# Patient Record
Sex: Female | Born: 1945 | Race: White | Hispanic: No | Smoking: Never smoker
Health system: Southern US, Community
[De-identification: ages and names within clinical notes are randomized; demographics above are authoritative.]

## PROBLEM LIST (undated history)

## (undated) DIAGNOSIS — K759 Inflammatory liver disease, unspecified: Secondary | ICD-10-CM

## (undated) DIAGNOSIS — Z8489 Family history of other specified conditions: Secondary | ICD-10-CM

## (undated) DIAGNOSIS — J302 Other seasonal allergic rhinitis: Secondary | ICD-10-CM

## (undated) DIAGNOSIS — F419 Anxiety disorder, unspecified: Secondary | ICD-10-CM

## (undated) DIAGNOSIS — F329 Major depressive disorder, single episode, unspecified: Secondary | ICD-10-CM

## (undated) DIAGNOSIS — F32A Depression, unspecified: Secondary | ICD-10-CM

## (undated) DIAGNOSIS — K8050 Calculus of bile duct without cholangitis or cholecystitis without obstruction: Secondary | ICD-10-CM

## (undated) DIAGNOSIS — I4891 Unspecified atrial fibrillation: Secondary | ICD-10-CM

## (undated) DIAGNOSIS — I499 Cardiac arrhythmia, unspecified: Secondary | ICD-10-CM

## (undated) HISTORY — DX: Depression, unspecified: F32.A

## (undated) HISTORY — DX: Major depressive disorder, single episode, unspecified: F32.9

## (undated) HISTORY — DX: Other seasonal allergic rhinitis: J30.2

## (undated) HISTORY — PX: ERCP: SHX60

## (undated) HISTORY — DX: Calculus of bile duct without cholangitis or cholecystitis without obstruction: K80.50

---
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
## 1982-08-17 HISTORY — PX: APPENDECTOMY: SHX54

## 1982-08-17 HISTORY — PX: ABDOMINAL HYSTERECTOMY: SHX81

## 1991-08-18 HISTORY — PX: HAMMER TOE SURGERY: SHX385

## 1998-09-03 ENCOUNTER — Other Ambulatory Visit: Admission: RE | Admit: 1998-09-03 | Discharge: 1998-09-03 | Payer: Self-pay | Admitting: Otolaryngology

## 2000-01-13 ENCOUNTER — Encounter: Admission: RE | Admit: 2000-01-13 | Discharge: 2000-01-13 | Payer: Self-pay | Admitting: *Deleted

## 2002-08-21 ENCOUNTER — Encounter: Admission: RE | Admit: 2002-08-21 | Discharge: 2002-08-21 | Payer: Self-pay | Admitting: *Deleted

## 2002-09-04 ENCOUNTER — Encounter: Admission: RE | Admit: 2002-09-04 | Discharge: 2002-09-04 | Payer: Self-pay | Admitting: *Deleted

## 2003-04-12 ENCOUNTER — Encounter: Admission: RE | Admit: 2003-04-12 | Discharge: 2003-04-12 | Payer: Self-pay | Admitting: Geriatric Medicine

## 2004-04-23 ENCOUNTER — Encounter: Admission: RE | Admit: 2004-04-23 | Discharge: 2004-04-23 | Payer: Self-pay | Admitting: Pulmonary Disease

## 2004-07-23 ENCOUNTER — Ambulatory Visit: Payer: Self-pay | Admitting: Pulmonary Disease

## 2004-09-30 ENCOUNTER — Ambulatory Visit: Payer: Self-pay | Admitting: Pulmonary Disease

## 2004-12-05 ENCOUNTER — Ambulatory Visit (HOSPITAL_COMMUNITY): Admission: RE | Admit: 2004-12-05 | Discharge: 2004-12-05 | Payer: Self-pay | Admitting: Family Medicine

## 2005-03-23 ENCOUNTER — Ambulatory Visit: Payer: Self-pay | Admitting: Pulmonary Disease

## 2005-05-28 ENCOUNTER — Encounter: Admission: RE | Admit: 2005-05-28 | Discharge: 2005-05-28 | Payer: Self-pay | Admitting: Pulmonary Disease

## 2005-06-02 ENCOUNTER — Ambulatory Visit: Payer: Self-pay | Admitting: Sports Medicine

## 2005-06-09 ENCOUNTER — Ambulatory Visit: Payer: Self-pay | Admitting: Sports Medicine

## 2005-06-15 ENCOUNTER — Ambulatory Visit: Payer: Self-pay | Admitting: Pulmonary Disease

## 2005-07-07 ENCOUNTER — Ambulatory Visit: Payer: Self-pay | Admitting: Sports Medicine

## 2005-08-07 ENCOUNTER — Ambulatory Visit: Payer: Self-pay | Admitting: Sports Medicine

## 2005-08-21 ENCOUNTER — Ambulatory Visit: Payer: Self-pay | Admitting: Sports Medicine

## 2006-02-12 ENCOUNTER — Ambulatory Visit: Payer: Self-pay | Admitting: Sports Medicine

## 2006-02-15 ENCOUNTER — Ambulatory Visit: Payer: Self-pay | Admitting: Pulmonary Disease

## 2006-03-22 ENCOUNTER — Ambulatory Visit: Payer: Self-pay | Admitting: Internal Medicine

## 2006-06-03 ENCOUNTER — Encounter: Admission: RE | Admit: 2006-06-03 | Discharge: 2006-06-03 | Payer: Self-pay | Admitting: Pulmonary Disease

## 2006-06-23 ENCOUNTER — Ambulatory Visit: Payer: Self-pay | Admitting: Pulmonary Disease

## 2006-08-18 ENCOUNTER — Ambulatory Visit: Payer: Self-pay | Admitting: Pulmonary Disease

## 2006-09-15 ENCOUNTER — Ambulatory Visit: Payer: Self-pay | Admitting: Sports Medicine

## 2006-09-28 ENCOUNTER — Ambulatory Visit: Payer: Self-pay | Admitting: Pulmonary Disease

## 2006-10-26 ENCOUNTER — Ambulatory Visit: Payer: Self-pay | Admitting: Pulmonary Disease

## 2006-10-26 LAB — CONVERTED CEMR LAB
ALT: 28 units/L (ref 0–40)
AST: 30 units/L (ref 0–37)
BUN: 13 mg/dL (ref 6–23)
Basophils Absolute: 0 10*3/uL (ref 0.0–0.1)
CO2: 30 meq/L (ref 19–32)
Calcium: 9.8 mg/dL (ref 8.4–10.5)
Eosinophils Absolute: 0.1 10*3/uL (ref 0.0–0.6)
Eosinophils Relative: 1.4 % (ref 0.0–5.0)
Glucose, Bld: 100 mg/dL — ABNORMAL HIGH (ref 70–99)
HCT: 42 % (ref 36.0–46.0)
Hemoglobin: 14.4 g/dL (ref 12.0–15.0)
Lymphocytes Relative: 40.4 % (ref 12.0–46.0)
MCHC: 34.2 g/dL (ref 30.0–36.0)
MCV: 85.4 fL (ref 78.0–100.0)
Monocytes Relative: 9.9 % (ref 3.0–11.0)
Neutrophils Relative %: 48.1 % (ref 43.0–77.0)
Platelets: 216 10*3/uL (ref 150–400)
Potassium: 4.3 meq/L (ref 3.5–5.1)
RDW: 12.6 % (ref 11.5–14.6)
TSH: 0.47 microintl units/mL (ref 0.35–5.50)
Total Bilirubin: 1 mg/dL (ref 0.3–1.2)
Total Protein: 6.7 g/dL (ref 6.0–8.3)

## 2006-11-15 ENCOUNTER — Ambulatory Visit: Payer: Self-pay | Admitting: Pulmonary Disease

## 2007-04-12 ENCOUNTER — Ambulatory Visit: Payer: Self-pay | Admitting: Sports Medicine

## 2007-04-21 ENCOUNTER — Telehealth: Payer: Self-pay | Admitting: Sports Medicine

## 2007-05-27 ENCOUNTER — Ambulatory Visit: Payer: Self-pay | Admitting: Sports Medicine

## 2007-06-22 ENCOUNTER — Ambulatory Visit: Payer: Self-pay | Admitting: Pulmonary Disease

## 2007-08-16 ENCOUNTER — Ambulatory Visit: Payer: Self-pay | Admitting: Pulmonary Disease

## 2007-08-16 DIAGNOSIS — F341 Dysthymic disorder: Secondary | ICD-10-CM

## 2007-08-16 DIAGNOSIS — D1739 Benign lipomatous neoplasm of skin and subcutaneous tissue of other sites: Secondary | ICD-10-CM | POA: Insufficient documentation

## 2007-08-16 DIAGNOSIS — M545 Low back pain, unspecified: Secondary | ICD-10-CM | POA: Insufficient documentation

## 2007-08-23 ENCOUNTER — Encounter: Payer: Self-pay | Admitting: Pulmonary Disease

## 2007-09-01 ENCOUNTER — Encounter: Admission: RE | Admit: 2007-09-01 | Discharge: 2007-09-01 | Payer: Self-pay | Admitting: Pulmonary Disease

## 2007-10-18 ENCOUNTER — Ambulatory Visit: Payer: Self-pay | Admitting: Pulmonary Disease

## 2007-10-18 LAB — CONVERTED CEMR LAB
BUN: 14 mg/dL (ref 6–23)
CO2: 30 meq/L (ref 19–32)
Chloride: 107 meq/L (ref 96–112)
Creatinine, Ser: 0.8 mg/dL (ref 0.4–1.2)
GFR calc Af Amer: 93 mL/min
Glucose, Bld: 84 mg/dL (ref 70–99)
HCT: 42.1 % (ref 36.0–46.0)
Hemoglobin: 13.9 g/dL (ref 12.0–15.0)
MCHC: 32.9 g/dL (ref 30.0–36.0)
MCV: 85.7 fL (ref 78.0–100.0)
Monocytes Absolute: 0.5 10*3/uL (ref 0.2–0.7)
Monocytes Relative: 6.8 % (ref 3.0–11.0)
Neutrophils Relative %: 52.7 % (ref 43.0–77.0)
Sodium: 142 meq/L (ref 135–145)
WBC: 7.5 10*3/uL (ref 4.5–10.5)

## 2007-10-25 ENCOUNTER — Ambulatory Visit: Payer: Self-pay | Admitting: Pulmonary Disease

## 2007-11-29 ENCOUNTER — Ambulatory Visit: Payer: Self-pay | Admitting: Pulmonary Disease

## 2007-12-05 ENCOUNTER — Telehealth: Payer: Self-pay | Admitting: Pulmonary Disease

## 2007-12-09 ENCOUNTER — Encounter: Payer: Self-pay | Admitting: Pulmonary Disease

## 2008-01-10 ENCOUNTER — Encounter: Payer: Self-pay | Admitting: Pulmonary Disease

## 2008-01-13 ENCOUNTER — Encounter: Admission: RE | Admit: 2008-01-13 | Discharge: 2008-01-13 | Payer: Self-pay | Admitting: Obstetrics and Gynecology

## 2008-01-19 ENCOUNTER — Encounter: Admission: RE | Admit: 2008-01-19 | Discharge: 2008-01-19 | Payer: Self-pay | Admitting: Obstetrics and Gynecology

## 2008-01-19 ENCOUNTER — Encounter (INDEPENDENT_AMBULATORY_CARE_PROVIDER_SITE_OTHER): Payer: Self-pay | Admitting: Interventional Radiology

## 2008-01-19 ENCOUNTER — Other Ambulatory Visit: Admission: RE | Admit: 2008-01-19 | Discharge: 2008-01-19 | Payer: Self-pay | Admitting: Interventional Radiology

## 2008-01-20 ENCOUNTER — Encounter: Payer: Self-pay | Admitting: Pulmonary Disease

## 2008-01-31 ENCOUNTER — Encounter: Payer: Self-pay | Admitting: Pulmonary Disease

## 2008-02-01 ENCOUNTER — Encounter: Payer: Self-pay | Admitting: Pulmonary Disease

## 2008-02-06 ENCOUNTER — Telehealth: Payer: Self-pay | Admitting: Sports Medicine

## 2008-02-06 ENCOUNTER — Encounter: Payer: Self-pay | Admitting: Sports Medicine

## 2008-02-07 ENCOUNTER — Encounter: Payer: Self-pay | Admitting: Pulmonary Disease

## 2008-04-04 ENCOUNTER — Ambulatory Visit: Payer: Self-pay | Admitting: Pulmonary Disease

## 2008-05-23 ENCOUNTER — Ambulatory Visit: Payer: Self-pay | Admitting: Pulmonary Disease

## 2008-05-23 ENCOUNTER — Encounter: Payer: Self-pay | Admitting: Adult Health

## 2008-05-24 ENCOUNTER — Telehealth (INDEPENDENT_AMBULATORY_CARE_PROVIDER_SITE_OTHER): Payer: Self-pay | Admitting: *Deleted

## 2008-05-25 LAB — CONVERTED CEMR LAB: Influenza B Ag: NEGATIVE

## 2008-05-28 ENCOUNTER — Telehealth (INDEPENDENT_AMBULATORY_CARE_PROVIDER_SITE_OTHER): Payer: Self-pay | Admitting: *Deleted

## 2008-08-28 ENCOUNTER — Ambulatory Visit: Payer: Self-pay | Admitting: Pulmonary Disease

## 2008-09-13 ENCOUNTER — Ambulatory Visit: Payer: Self-pay | Admitting: Pulmonary Disease

## 2008-10-05 ENCOUNTER — Telehealth (INDEPENDENT_AMBULATORY_CARE_PROVIDER_SITE_OTHER): Payer: Self-pay | Admitting: *Deleted

## 2008-10-22 ENCOUNTER — Telehealth (INDEPENDENT_AMBULATORY_CARE_PROVIDER_SITE_OTHER): Payer: Self-pay | Admitting: *Deleted

## 2008-10-23 ENCOUNTER — Ambulatory Visit: Payer: Self-pay | Admitting: Pulmonary Disease

## 2008-10-23 DIAGNOSIS — K5289 Other specified noninfective gastroenteritis and colitis: Secondary | ICD-10-CM

## 2008-10-23 LAB — CONVERTED CEMR LAB: Vit D, 25-Hydroxy: 41 ng/mL (ref 30–89)

## 2008-10-24 ENCOUNTER — Telehealth: Payer: Self-pay | Admitting: Pulmonary Disease

## 2008-10-24 ENCOUNTER — Encounter: Payer: Self-pay | Admitting: Pulmonary Disease

## 2008-10-26 LAB — CONVERTED CEMR LAB
ALT: 30 units/L (ref 0–35)
AST: 36 units/L (ref 0–37)
Albumin: 4 g/dL (ref 3.5–5.2)
Alkaline Phosphatase: 60 units/L (ref 39–117)
Basophils Absolute: 0 10*3/uL (ref 0.0–0.1)
CO2: 25 meq/L (ref 19–32)
Calcium: 9 mg/dL (ref 8.4–10.5)
Eosinophils Absolute: 0.1 10*3/uL (ref 0.0–0.7)
Eosinophils Relative: 1.7 % (ref 0.0–5.0)
GFR calc Af Amer: 108 mL/min
Lymphocytes Relative: 35.1 % (ref 12.0–46.0)
MCHC: 33.9 g/dL (ref 30.0–36.0)
MCV: 84 fL (ref 78.0–100.0)
Monocytes Absolute: 0.5 10*3/uL (ref 0.1–1.0)
Monocytes Relative: 10.3 % (ref 3.0–12.0)
Neutrophils Relative %: 52 % (ref 43.0–77.0)
Sodium: 140 meq/L (ref 135–145)
TSH: 0.43 microintl units/mL (ref 0.35–5.50)

## 2009-03-08 ENCOUNTER — Ambulatory Visit: Payer: Self-pay | Admitting: Internal Medicine

## 2009-03-15 ENCOUNTER — Telehealth: Payer: Self-pay | Admitting: Pulmonary Disease

## 2009-04-02 ENCOUNTER — Ambulatory Visit: Payer: Self-pay | Admitting: Internal Medicine

## 2009-04-02 DIAGNOSIS — J069 Acute upper respiratory infection, unspecified: Secondary | ICD-10-CM | POA: Insufficient documentation

## 2009-06-07 ENCOUNTER — Telehealth (INDEPENDENT_AMBULATORY_CARE_PROVIDER_SITE_OTHER): Payer: Self-pay | Admitting: *Deleted

## 2009-06-17 ENCOUNTER — Ambulatory Visit: Payer: Self-pay | Admitting: Pulmonary Disease

## 2009-07-05 ENCOUNTER — Ambulatory Visit: Payer: Self-pay | Admitting: Pulmonary Disease

## 2009-07-05 DIAGNOSIS — E042 Nontoxic multinodular goiter: Secondary | ICD-10-CM

## 2009-07-05 DIAGNOSIS — B009 Herpesviral infection, unspecified: Secondary | ICD-10-CM

## 2009-07-15 ENCOUNTER — Ambulatory Visit: Payer: Self-pay | Admitting: Pulmonary Disease

## 2009-07-16 ENCOUNTER — Emergency Department (HOSPITAL_COMMUNITY): Admission: EM | Admit: 2009-07-16 | Discharge: 2009-07-16 | Payer: Self-pay | Admitting: Emergency Medicine

## 2009-07-29 ENCOUNTER — Telehealth (INDEPENDENT_AMBULATORY_CARE_PROVIDER_SITE_OTHER): Payer: Self-pay | Admitting: *Deleted

## 2010-02-10 ENCOUNTER — Telehealth (INDEPENDENT_AMBULATORY_CARE_PROVIDER_SITE_OTHER): Payer: Self-pay | Admitting: *Deleted

## 2010-02-11 ENCOUNTER — Encounter (INDEPENDENT_AMBULATORY_CARE_PROVIDER_SITE_OTHER): Payer: Self-pay | Admitting: *Deleted

## 2010-05-22 ENCOUNTER — Ambulatory Visit: Payer: Self-pay | Admitting: Pulmonary Disease

## 2010-08-22 ENCOUNTER — Ambulatory Visit: Admit: 2010-08-22 | Payer: Self-pay | Admitting: Pulmonary Disease

## 2010-08-25 ENCOUNTER — Telehealth: Payer: Self-pay | Admitting: Adult Health

## 2010-09-05 ENCOUNTER — Ambulatory Visit
Admission: RE | Admit: 2010-09-05 | Discharge: 2010-09-05 | Payer: Self-pay | Source: Home / Self Care | Attending: Pulmonary Disease | Admitting: Pulmonary Disease

## 2010-09-08 ENCOUNTER — Telehealth: Payer: Self-pay | Admitting: Pulmonary Disease

## 2010-09-16 NOTE — Letter (Signed)
Summary: Out of Work  [facility]  [address]   [place], [place] [address]   Phone: [phone]  Fax: [phone]    February 11, 2010   Passenger:  Gladie P ROGERS    To Whom It May Concern:   For Medical reasons, please allow the above named passenger to have the her therapeutic red gel balls on the flight. If you need additional information, please feel free to contact our office.         Sincerely,    Sibyl Parr. Fields, M.D./Else Habermann Christell Constant CMA

## 2010-09-16 NOTE — Progress Notes (Signed)
Summary: drs note  Phone Note Call from Patient Call back at [phone]   Caller: Patient Call For: nadel Summary of Call: pt need drs note to carry therapy ballls on the plane. Initial call taken by: Rickard Patience,  February 10, 2010 3:52 PM  Follow-up for Phone Call        Pt requesting note so that she can carry "therapy balls" for arthritis.  Please advise thanks! Carly Rhodes  February 10, 2010 3:57 PM   Additional Follow-up for Phone Call Additional follow up Details #1::        per SN---this is beyond the scope of my expertise---SN could not qualify as a specialist that could authorize therapy balls to be transported on a plane.   Randell Loop CMA  February 10, 2010 4:18 PM     Additional Follow-up for Phone Call Additional follow up Details #2::    Spoke with pt and advised the above recs per SN.  Pt verbalized understanding. Follow-up by: Carly Rhodes,  February 10, 2010 4:39 PM

## 2010-09-16 NOTE — Assessment & Plan Note (Signed)
Summary: flu shot/jd   Nurse Visit   Allergies: No Known Drug Allergies  Orders Added: 1)  Admin 1st Vaccine [90471] 2)  Flu Vaccine 80yrs + [04540] Flu Vaccine Consent Questions     Do you have a history of severe allergic reactions to this vaccine? no    Any prior history of allergic reactions to egg and/or gelatin? no    Do you have a sensitivity to the preservative Thimersol? no    Do you have a past history of Guillan-Barre Syndrome? no    Do you currently have an acute febrile illness? no    Have you ever had a severe reaction to latex? no    Vaccine information given and explained to patient? yes    Are you currently pregnant? no    Lot Number:[id number]   Exp Date:02/14/2011   Site Given  Left Deltoid IM   Tammy Scott  May 22, 2010 11:53 AM
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
## 2010-09-18 NOTE — Progress Notes (Signed)
Summary: nos appt  Phone Note Call from Patient   Caller: [email or web address]  Call For: Kester Stimpson Summary of Call: ATC pt to rsc nos from 1/6, wrong home phone number and cell phone disconnected. Initial call taken by: Darletta Moll,  August 25, 2010 9:58 AM

## 2010-09-18 NOTE — Progress Notes (Signed)
Summary: needs refill  Phone Note Call from Patient Call back at [phone]   Caller: Patient Call For: Carly Rhodes Reason for Call: Refill Medication Summary of Call: patient did not get a rx for fluoxetine 40 mg on friday. she would like a refill for that and a refill for a rx for fever blisters but not the acyclovir 400 mg. she said she wants the rx dr Kriste Basque gave her for those.  pharm [facility]. [phone] Initial call taken by: Valinda Hoar,  September 08, 2010 8:11 AM  Follow-up for Phone Call        Pt is requesting a refill on fluoxetine and also on valtrex. She has acyclovir on med list but she states that this not work as well as valtrex. Please advsie if ok to refill both. Carron Curie CMA  September 08, 2010 10:16 AM   Additional Follow-up for Phone Call Additional follow up Details #1::        Pt informed. Zackery Barefoot CMA  September 08, 2010 10:38 AM     New/Updated Medications: VALTREX 1 GM TABS (VALACYCLOVIR HCL) 2 by mouth two times a day x 1 day Prescriptions: VALTREX 1 GM TABS (VALACYCLOVIR HCL) 2 by mouth two times a day x 1 day  #4 x 5   Entered and Authorized by:   Rubye Oaks NP   Signed by:   Rubye Oaks NP on 09/08/2010   Method used:   Electronically to        [facility] [address] (retail)       [phone]       [place], [place]  [address]       Ph: [phone]       Fax: [phone]   RxID:   [id number] FLUOXETINE HCL 40 MG  CAPS (FLUOXETINE HCL) Take 1 tablet by mouth once a day  #30 Each x 11   Entered and Authorized by:   Rubye Oaks NP   Signed by:   Rubye Oaks NP on 09/08/2010   Method used:   Electronically to        [facility] [address] (retail)       [phone]       [place], [place]  [address]       Ph: [phone]       Fax: [phone]   RxID:   [id number]

## 2010-10-31 ENCOUNTER — Encounter: Payer: Self-pay | Admitting: Pulmonary Disease

## 2010-10-31 ENCOUNTER — Ambulatory Visit (INDEPENDENT_AMBULATORY_CARE_PROVIDER_SITE_OTHER)
Admission: RE | Admit: 2010-10-31 | Discharge: 2010-10-31 | Disposition: A | Discharge status: Home / Self Care | Payer: Medicare Other | Source: Ambulatory Visit | Attending: Pulmonary Disease | Admitting: Pulmonary Disease

## 2010-10-31 ENCOUNTER — Other Ambulatory Visit: Payer: Medicare Other

## 2010-10-31 ENCOUNTER — Encounter (INDEPENDENT_AMBULATORY_CARE_PROVIDER_SITE_OTHER): Payer: Medicare Other | Admitting: Pulmonary Disease

## 2010-10-31 ENCOUNTER — Other Ambulatory Visit: Payer: Self-pay | Admitting: Pulmonary Disease

## 2010-10-31 DIAGNOSIS — D1739 Benign lipomatous neoplasm of skin and subcutaneous tissue of other sites: Secondary | ICD-10-CM

## 2010-10-31 DIAGNOSIS — F341 Dysthymic disorder: Secondary | ICD-10-CM

## 2010-10-31 DIAGNOSIS — M545 Low back pain, unspecified: Secondary | ICD-10-CM

## 2010-10-31 DIAGNOSIS — R05 Cough: Secondary | ICD-10-CM

## 2010-10-31 DIAGNOSIS — M810 Age-related osteoporosis without current pathological fracture: Secondary | ICD-10-CM

## 2010-10-31 DIAGNOSIS — J069 Acute upper respiratory infection, unspecified: Secondary | ICD-10-CM

## 2010-10-31 DIAGNOSIS — E042 Nontoxic multinodular goiter: Secondary | ICD-10-CM

## 2010-10-31 DIAGNOSIS — E785 Hyperlipidemia, unspecified: Secondary | ICD-10-CM

## 2010-10-31 DIAGNOSIS — E7800 Pure hypercholesterolemia, unspecified: Secondary | ICD-10-CM

## 2010-10-31 LAB — CBC WITH DIFFERENTIAL/PLATELET
Basophils Absolute: 0 10*3/uL (ref 0.0–0.1)
Eosinophils Relative: 0.9 % (ref 0.0–5.0)
Lymphocytes Relative: 19.6 % (ref 12.0–46.0)
MCHC: 34.7 g/dL (ref 30.0–36.0)
Monocytes Relative: 5.9 % (ref 3.0–12.0)
Neutro Abs: 6 10*3/uL (ref 1.4–7.7)
Neutrophils Relative %: 73.2 % (ref 43.0–77.0)
Platelets: 185 10*3/uL (ref 150.0–400.0)

## 2010-10-31 LAB — BASIC METABOLIC PANEL
Creatinine, Ser: 0.6 mg/dL (ref 0.4–1.2)
GFR: 106.27 mL/min (ref >60.00)
Sodium: 138 mEq/L (ref 135–145)

## 2010-10-31 LAB — HEPATIC FUNCTION PANEL
ALT: 24 U/L (ref 0–35)
AST: 25 U/L (ref 0–37)
Alkaline Phosphatase: 63 U/L (ref 39–117)
Total Bilirubin: 1 mg/dL (ref 0.3–1.2)

## 2010-10-31 LAB — LIPID PANEL
Total CHOL/HDL Ratio: 4
VLDL: 28.4 mg/dL (ref 0.0–40.0)

## 2010-10-31 LAB — TSH: TSH: 0.23 u[IU]/mL — ABNORMAL LOW (ref 0.35–5.50)

## 2010-11-02 LAB — CONVERTED CEMR LAB: Vit D, 25-Hydroxy: 50 ng/mL

## 2010-11-17 ENCOUNTER — Other Ambulatory Visit: Payer: Medicare Other

## 2010-11-18 NOTE — Assessment & Plan Note (Signed)
Summary: annual /sh   CC:  16 month ROV & review of mult medical problems....  History of Present Illness: 65 y/o WF here for a follow up visit... he has multiple medical problems as noted below...     ~  Mar10:  she presents today w/ "intestinal flu" stating that sh'e had 2d hx of feeling cold, N/ V/ D, aching all over, headache, now watery diarrhea w/o blood etc... denies f/c/s... states liquids "go right thru me" & "this is way past immodium" (she hasn't tried any OTC meds for her symptoms)...  she was seen by TP in Jan10 w/ URI and Rx w/ ZPak, then Augmentin- no problems after these meds... no other more recent antibiotics, no recent travel, no sick exposures...  we discussed gastroenteritis:  eval w/ lab work, check stools;  treatment w/ clear liq's, tylenol, lomotil sparingly...   ~  July 05, 2009:  she notes "last winter was bad- I had the flu 4 times"... c/o recent URI w/ ZPak given + Zyrtek, Nasacort/ Saline... c/o persistant symptoms and leaving for [place] in 3d... we discussed Rx w/ Depo80 + Medrol Dosepak and waiting on her flu shot til return...   ~  October 31, 2010:  68mo ROV & generally stable; requests refills as noted + ZPak to keep on hand; due for CXR, fasting blood work, & BMD...    Thyroid>  Prev eval as noted- multinod gland w/ bx ordered per DrDickstein & done by IR, DrYamagata was very difficult & painful according to the pt (benign non-neoplastic goiter);  she recalls prev throid eval by ENT w/ bx by ?DrByers that was easy;  serial TFTs w/ TSH in the low normal range;  TSH today = 0.23 & will need repeat w/ FreeT3 & FreeT4...    Hx LBP>  she sees Chiropractor & DrFields for sports med.    Dysthymia>  remains on Prozac40 & Alpraz 0.5 Prn;  notes that these meds continue to help & she requests continued refills...    Current Problems:   Hx of HEPATITIS A - age 89, no known residual problems...  HYPERCHOLESTEROLEMIA (ICD-272.0) - on diet, exercise & OTC meds;  she  refuses to consider prescription drug therapy for this problem...  ~  FLP 3/12 showed TChol 242, TG 142, HDL 56, LDL 157... she refuses med rx.  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 we do not have the details of the prev eval (?by ENT DrByers?)... TFT's here were normal--- looks like she had a thyroid bx by DrYamagata ordered by DrDickstein 6/09= c/w hyperplastic nodule/ non-neoplastic goiter (this bx was difficult & painful she recalls)...  ~  labs 3/08 showed TSH = 0.47  ~  labs 3/09 showed TSH = 0.55  ~  Thyroid Sonar 5/09 showed diffusely abn thyroid c/w multinod gland & bilat biopsies done 6/09 revealing hyperplastic nodules/ non-neoplastic goiter...  ~  labs 3/10 showed TSH= 0.43  ~  labs 3/12 showed TSH= 0.23  Hx of LOW BACK PAIN SYNDROME (ICD-724.2) - prev evals from chiropractor and DrFields for sports medicine...   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 she states "I've had a melt-down & left my husb after 57yrs of marriage"... she is seeing Dr. Garald Braver psychologist for counselling help "he says it's grief"... she is requesting to change to her sister's medication = EffexorXR, instead of Lexapro...  ~  2009: we accommodated her requests- tried Lexapro-  too $$$, Effexor- caused nightmares, then Zoloft, and now on generic Prozac 40mg /d...   ~  11/10: she remains on Prozac40mg /d & wishes to continue Rx as she feels this is continuing to help...  ~  3/12:  ditto- continues on Prozac40 & Alpraz 0.5mg  Prn & requests refills...  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 she went to plastic surg at [facility] w/ lipoma excision and removal of dystrophic right great toenail by DrMarks in 2009.  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  HEALTH MAINTENANCE:  she states that  she had a colonoscopy in [facility] in 2007... we do not have this report.... routine GYN- saw DrDickstein in 2009... she has her Mammograms at the [facility], but hasn't had a BMD- "they don't work, it was in the news"--- finally had BMD 6/09 by Ma Hillock GYN= TScores -2.9 in spine,  & -2.1 in left [facility] w/ rec for Rx by DrDickstein on ALENDRONATE... Vit D level 3/10= 41...    Preventive Screening-Counseling & Management  Alcohol-Tobacco     Smoking Status: never  Allergies (verified): No Known Drug Allergies  Comments:  Nurse/Medical Assistant: The patient's medications and allergies were reviewed with the patient and were updated in the Medication and Allergy Lists.  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 father - asthma, DM M uncles (5) - DM mother - brain cancer MGM - arthritis  Social History: Reviewed history from 03/08/2009 and no changes required. never smoked no alcohol lives alone going through a divorce currently 2 grown children drinks 2-3 cups coffee daily  Review of Systems       The patient complains of gas/bloating, back pain, stiffness, arthritis, depression, and hay fever.  The patient denies fever, chills, sweats, anorexia, fatigue, weakness, malaise, weight loss, sleep disorder, blurring, diplopia, eye irritation, eye discharge, vision loss, eye pain, photophobia, earache, ear discharge, tinnitus, decreased hearing, nasal congestion, nosebleeds, sore throat, hoarseness, chest pain, palpitations,  syncope, dyspnea on exertion, orthopnea, PND, peripheral edema, cough, dyspnea at rest, excessive sputum, hemoptysis, wheezing, pleurisy, nausea, vomiting, diarrhea, constipation, change in bowel habits, abdominal pain, melena, hematochezia, jaundice, indigestion/heartburn, dysphagia, odynophagia, dysuria, hematuria, urinary frequency, urinary hesitancy, nocturia, incontinence, joint pain, joint swelling, muscle cramps, muscle weakness, sciatica, restless legs, leg pain at night, leg pain with exertion, rash, itching, dryness, suspicious lesions, paralysis, paresthesias, seizures, tremors, vertigo, transient blindness, frequent falls, frequent headaches, difficulty walking, anxiety, memory loss, confusion, cold intolerance, heat intolerance, polydipsia, polyphagia, polyuria, unusual weight change, abnormal bruising, bleeding, enlarged lymph nodes, urticaria, allergic rash, and recurrent infections.    Vital Signs:  Patient profile:   65 year old female Height:      65 inches Weight:      171.25 pounds BMI:     28.60 O2 Sat:      99 % on Room air Temp:     99.1 degrees F oral Pulse rate:   73 / minute BP sitting:   120 / 74  (left arm) Cuff size:   regular  Vitals Entered By:  Randell Loop CMA (October 31, 2010 10:21 AM)  O2 Sat at Rest %:  99 O2 Flow:  Room air CC: 16 month ROV & review of mult medical problems... Is Patient Diabetic? No Pain Assessment Patient in pain? no      Comments meds updated today with pt   Physical Exam  Additional Exam:  WD, WN, 65 y/o WF in NAD... GENERAL:  Alert & oriented; pleasant & cooperative... HEENT:  [place]/AT, EOM-full, PERRLA, EACs-clear, TMs-normal, NOSE-sl pale, THROAT-clear & wnl. NECK:  Supple w/ fairROM; no JVD; normal carotid impulses w/o bruits; +thyromegaly w/ coarse bilat nodularity; no lymphadenopathy. CHEST:  Coarse BS w/ no wheezes, rales, or signs of consolidation. HEART:  Regular Rhythm; without murmurs/ rubs/ or gallops. ABDOMEN:  Soft  & nontender; incr bowel sounds; no organomegaly or masses detected. EXT: without deformities or arthritic changes; no varicose veins/ venous insuffic/ or edema. NEURO:  no focal neuro deficits... DERM: no skin lesions identified...    Impression & Recommendations:  Problem # 1:  UPPER RESPIRATORY INFECTION (ICD-465.9) Improved>  CXR shows sl incr markings, NAD... Her updated medication list for this problem includes:    Bayer Aspirin 325 Mg Tabs (Aspirin) .Marland Kitchen... Take 1 tablet by mouth once a day  Orders: T-Vitamin D (25-Hydroxy) (22025-42706) TLB-BMP (Basic Metabolic Panel-BMET) (80048-METABOL) TLB-Hepatic/Liver Function Pnl (80076-HEPATIC) TLB-CBC Platelet - w/Differential (85025-CBCD) TLB-Lipid Panel (80061-LIPID) TLB-TSH (Thyroid Stimulating Hormone) (84443-TSH)  Problem # 2:  HYPERCHOLESTEROLEMIA (ICD-272.0) We reviewed her FLP not at goal but she adamantly refuses statin therapy & will trat this w/ diet, exercise, & OTC meds on her own...  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  Problem # 4:  OSTEOPOROSIS (ICD-733.00) She is on alendronate per DrDickstein who has retired> due for f/u BMD & would like to get it here... Vit D level = 50 on her current supplement regimen. Her updated medication list for this problem includes:    Alendronate Sodium 70 Mg Tabs (Alendronate sodium) .Marland Kitchen... Take 1 tab by mouth each week...  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 refilled per request.  Complete Medication List: 1)  Bayer Aspirin 325 Mg Tabs (Aspirin) .... Take 1 tablet by mouth once a day 2)  Lexapro 10 Mg Tabs (Escitalopram oxalate) .... Take 1 tab by mouth once daily for depression.Marland KitchenMarland Kitchen 3)  Alprazolam 0.5 Mg Tabs (Alprazolam) .... Take 1 tab by mouth at bedtime as needed for sleep.Marland KitchenMarland Kitchen 4)  Alendronate Sodium 70 Mg  Tabs (Alendronate sodium) .... Take 1 tab by mouth each week... 5)  Caltrate 600+d Plus 600-400 Mg-unit Tabs (Calcium carbonate-vit d-min) .... Take one tab daily 6)  Womens Multivitamin Plus Tabs (Multiple vitamins-minerals) .... Take 1 tab daily 7)  Vitamin D3 1000 Unit Caps (Cholecalciferol) .... Take 1 cap daily 8)  Valtrex 1 Gm Tabs (Valacyclovir hcl) .... 2 by mouth two times a day x 1 day 9)  Zithromax Z-pak 250 Mg Tabs (Azithromycin) .... Take as directed for infection...  Other Orders: T-2 View CXR (71020TC) Dexa scan (Dexa scan)  Patient Instructions: 1)  Today we updated your med list- see below.... 2)  We decided to change the Fluoxetine to Lexapro starting w/ 10mg  daily... after one month call me if you feel you need to increase the dose & we can go to 20mg /d.Marland KitchenMarland Kitchen 3)  Today we  did your follow up CXR, & fasting blood work... please call the "phone tree" in a few days for your lab results.Marland KitchenMarland Kitchen 4)  We will sched a Bone density test for you & call you w/ this result when avail.Marland KitchenMarland Kitchen 5)  We gave you a ZPak w/ refills for as needed use... 6)  Call for any problems.Marland KitchenMarland Kitchen 7)  Please schedule a follow-up appointment in 1 year, sooner as needed. Prescriptions: ZITHROMAX Z-PAK 250 MG TABS (AZITHROMYCIN) take as directed for infection...  #1 pack x 2   Entered and Authorized by:   Michele Mcalpine MD   Signed by:   Michele Mcalpine MD on 10/31/2010   Method used:   Print then Give to Patient   RxID:   [id number] ALENDRONATE SODIUM 70 MG TABS (ALENDRONATE SODIUM) take 1 tab by mouth each week...  #4 x 12   Entered and Authorized by:   Michele Mcalpine MD   Signed by:   Michele Mcalpine MD on 10/31/2010   Method used:   Print then Give to Patient   RxID:   [id number] VALTREX 1 GM TABS (VALACYCLOVIR HCL) 2 by mouth two times a day x 1 day  #4 x 6   Entered and Authorized by:   Michele Mcalpine MD   Signed by:   Michele Mcalpine MD on 10/31/2010   Method used:   Print then Give to Patient   RxID:    [id number] ALPRAZOLAM 0.5 MG TABS (ALPRAZOLAM) Take 1 tab by mouth at bedtime as needed for sleep...  #30 x 6   Entered and Authorized by:   Michele Mcalpine MD   Signed by:   Michele Mcalpine MD on 10/31/2010   Method used:   Print then Give to Patient   RxID:   [id number] LEXAPRO 10 MG TABS (ESCITALOPRAM OXALATE) take 1 tab by mouth once daily for depression...  #30 x 12   Entered and Authorized by:   Michele Mcalpine MD   Signed by:   Michele Mcalpine MD on 10/31/2010   Method used:   Print then Give to Patient   RxID:   [id number]

## 2010-12-29 ENCOUNTER — Ambulatory Visit (INDEPENDENT_AMBULATORY_CARE_PROVIDER_SITE_OTHER): Payer: Medicare Other | Admitting: Family Medicine

## 2010-12-29 ENCOUNTER — Encounter: Payer: Self-pay | Admitting: Family Medicine

## 2010-12-29 VITALS — BP 124/85 | HR 61 | Temp 97.4°F | Ht 66.0 in | Wt 170.0 lb

## 2010-12-29 DIAGNOSIS — M545 Low back pain, unspecified: Secondary | ICD-10-CM

## 2010-12-29 MED ORDER — HYDROCODONE-ACETAMINOPHEN 5-500 MG PO TABS: 1.0000 | ORAL_TABLET | Freq: Three times a day (TID) | ORAL | Status: DC | PRN

## 2010-12-29 NOTE — Patient Instructions (Signed)
You have musculoskeletal low back pain Take tylenol for baseline pain relief (1-2 extra strength tabs 3x/day) Aleve two tabs twice a day with food for 7 days then as needed for pain and inflammation. Vicodin as needed for severe pain (no driving on this medicine). Stay as active as possible. Do home exercises and stretches as directed - hold each for 20-30 seconds and do each one three times. Consider massage, chiropractor, physical therapy, and/or acupuncture. Physical therapy has been shown to be helpful while the others have mixed results. Strengthening of low back muscles, abdominal musculature are key for long term pain relief. If not improving, will consider further imaging (x-rays, MRI) and/or other medications (neurontin, lyrica, nortriptyline) that help with pain. More than 90% of people are better by 6 weeks with this issue.

## 2011-01-05 ENCOUNTER — Encounter: Payer: Self-pay | Admitting: Family Medicine

## 2011-01-05 NOTE — Assessment & Plan Note (Signed)
Lumbar strain - patient's history and exam consistent with lumbar strain, musculoskeletal low back pain.  Start tylenol, aleve for pain.  Vicodin as needed for severe pain.  Home stretches and exercises with consideration of PT.  F/u in 6 weeks for reevaluation.  Consider imaging, PT if not improving.

## 2011-01-05 NOTE — Progress Notes (Signed)
  Subjective:    Patient ID: Carly Rhodes, female    DOB: 11-Jan-1946, 65 y.o.   MRN: [id number]  HPI  65 yo F here for low back pain  Patient reports approximately 2 1/2 weeks ago was cutting a bush when she fell off a step stool - no pain this day or in the days following. Then about 1 week ago started slowly developing right sided low back pain with a catching sensation here. Was hard to get up or stay on feet because of pain in this area. No radiation into leg No numbness or tingling. She has tried vicodin which helps some. Not icing, using heat. No bowel/bladder dysfunction.  History reviewed. No pertinent past medical history.  No current outpatient prescriptions on file prior to visit.    No past surgical history on file.  No Known Allergies  History   Social History  . Marital Status: Married    Spouse Name: N/A    Number of Children: N/A  . Years of Education: N/A   Occupational History  . Not on file.   Social History Main Topics  . Smoking status: Never Smoker   . Smokeless tobacco: Not on file  . Alcohol Use: Not on file  . Drug Use: Not on file  . Sexually Active: Not on file   Other Topics Concern  . Not on file   Social History Narrative  . No narrative on file    Family History  Problem Relation Age of Onset  . Diabetes Father   . Hypertension Sister   . Heart attack Neg Hx     BP 124/85  Pulse 61  Temp(Src) 97.4 F (36.3 C) (Oral)  Ht 5\' 6"  (1.676 m)  Wt 170 lb (77.111 kg)  BMI 27.44 kg/m2  Review of Systems See HPI above.    Objective:   Physical Exam Gen: NAD Back: No gross deformity, scoliosis. TTP right lumbar paraspinal region.  No midline, bony, or left paraspinal TTP. FROM with mild pain right lumbar paraspinal region with flexion and extension. Strength 5/5 BLEs Negative SLRs BLEs. Sensation intact to light touch BLEs. MSRs 1+ and equal bilateral patellar and achilles tendons.     Assessment & Plan:  1.  Lumbar strain - patient's history and exam consistent with lumbar strain, musculoskeletal low back pain.  Start tylenol, aleve for pain.  Vicodin as needed for severe pain.  Home stretches and exercises with consideration of PT.  F/u in 6 weeks for reevaluation.  Consider imaging, PT if not improving.

## 2011-05-20 ENCOUNTER — Ambulatory Visit (INDEPENDENT_AMBULATORY_CARE_PROVIDER_SITE_OTHER): Payer: Medicare Other

## 2011-05-20 DIAGNOSIS — Z23 Encounter for immunization: Secondary | ICD-10-CM

## 2011-08-03 ENCOUNTER — Telehealth: Payer: Self-pay | Admitting: Pulmonary Disease

## 2011-08-03 MED ORDER — ALPRAZOLAM 0.5 MG PO TABS: ORAL_TABLET | ORAL | Status: DC

## 2011-08-03 NOTE — Telephone Encounter (Signed)
I have called pt rx for alprazolam into the pharmacy ([facility]). Per leigh okay to do so. Pt is aware of this

## 2011-09-08 ENCOUNTER — Telehealth: Payer: Self-pay | Admitting: Pulmonary Disease

## 2011-09-08 NOTE — Telephone Encounter (Signed)
lmomtcb x1 

## 2011-09-09 NOTE — Telephone Encounter (Signed)
I spoke with pt and she states she needs a colonoscopy done. Pt states she works full time now and it is hard for her to get to the [facility] in [place]. Pt is wanting to know who Dr. Kriste Basque would recommend for this or if he feels she should just go back to the medical center to have this done. Please advise Dr. Kriste Basque, thanks

## 2011-09-09 NOTE — Telephone Encounter (Signed)
Per SN---[facility]---Dr. Juanda Chance.  thanks

## 2011-09-09 NOTE — Telephone Encounter (Signed)
I spoke with pt and she states she will call Dr. Regino Schultze office and see if she can get in. Nothing further was needed

## 2011-09-11 ENCOUNTER — Other Ambulatory Visit: Payer: Self-pay | Admitting: Adult Health

## 2011-09-14 ENCOUNTER — Encounter: Payer: Self-pay | Admitting: Internal Medicine

## 2011-09-18 ENCOUNTER — Other Ambulatory Visit: Payer: Self-pay | Admitting: Allergy

## 2011-09-23 ENCOUNTER — Ambulatory Visit (AMBULATORY_SURGERY_CENTER): Payer: Medicare Other | Admitting: *Deleted

## 2011-09-23 ENCOUNTER — Telehealth: Payer: Self-pay | Admitting: *Deleted

## 2011-09-23 ENCOUNTER — Encounter: Payer: Self-pay | Admitting: Internal Medicine

## 2011-09-23 VITALS — Ht 65.5 in | Wt 175.4 lb

## 2011-09-23 DIAGNOSIS — Z1211 Encounter for screening for malignant neoplasm of colon: Secondary | ICD-10-CM

## 2011-09-23 MED ORDER — PEG-KCL-NACL-NASULF-NA ASC-C 100 G PO SOLR: ORAL | Status: DC

## 2011-09-23 NOTE — Telephone Encounter (Signed)
Noted  

## 2011-09-23 NOTE — Progress Notes (Signed)
Pt scheduled for colonoscopy with Dr. Juanda Chance 10/02/2011. Previous colonoscopy 5 years ago at [facility] in [place], [place]. Release of information form signed and given to [facility]. Carly Rhodes

## 2011-09-23 NOTE — Telephone Encounter (Signed)
Pt scheduled for colonoscopy with Dr. Brodie 10/02/2011. Previous colonoscopy 5 years ago at [facility] in [place], [place]. Release of information form signed and given to Dottie. Kathy Smith     

## 2011-10-02 ENCOUNTER — Ambulatory Visit (AMBULATORY_SURGERY_CENTER): Payer: Medicare Other | Admitting: Internal Medicine

## 2011-10-02 ENCOUNTER — Encounter: Payer: Self-pay | Admitting: Internal Medicine

## 2011-10-02 VITALS — BP 129/73 | HR 68 | Temp 98.3°F | Resp 20 | Ht 65.5 in | Wt 175.0 lb

## 2011-10-02 DIAGNOSIS — Z1211 Encounter for screening for malignant neoplasm of colon: Secondary | ICD-10-CM

## 2011-10-02 MED ORDER — SODIUM CHLORIDE 0.9 % IV SOLN: 500.0000 mL | INTRAVENOUS | Status: DC

## 2011-10-02 NOTE — Progress Notes (Signed)
Patient did not have preoperative order for IV antibiotic SSI prophylaxis. (G8918)  Patient did not experience any of the following events: a burn prior to discharge; a fall within the facility; wrong site/side/patient/procedure/implant event; or a hospital transfer or hospital admission upon discharge from the facility. (G8907)  

## 2011-10-02 NOTE — Op Note (Addendum)
[facility] [address] [place], [place]  [address]  COLONOSCOPY PROCEDURE REPORT  PATIENT:  Carly, Rhodes  MR#:  [id number] BIRTHDATE:  02/04/1946, 66 yrs. old  GENDER:  female ENDOSCOPIST:  Hedwig Morton. Juanda Chance, MD REF. BY:  Alroy Dust, M.D. PROCEDURE DATE:  10/02/2011 PROCEDURE:  Colonoscopy 19147 ASA CLASS:  Class I INDICATIONS:  colorectal cancer screening, average risk , grandmother with colon cancer  late in life, brother with "stomach" cancer,prior colonoscopy 6 years ago was normal ( no records) MEDICATIONS:   MAC sedation, administered by CRNA, propofol (Diprivan) 250 mg  DESCRIPTION OF PROCEDURE:   After the risks and benefits and of the procedure were explained, informed consent was obtained. Digital rectal exam was performed and revealed no rectal masses. The [id number] endoscope was introduced through the anus and advanced to the cecum, which was identified by both the appendix and ileocecal valve.  The quality of the prep was good, using MoviPrep.  The instrument was then slowly withdrawn as the colon was fully examined. <<PROCEDUREIMAGES>>  FINDINGS:  No polyps or cancers were seen (see image2, image3, image4, and image5).   Retroflexed views in the rectum revealed no abnormalities.    The scope was then withdrawn from the patient and the procedure completed.  COMPLICATIONS:  None ENDOSCOPIC IMPRESSION: 1) No polyps or cancers 2) Normal colonoscopy RECOMMENDATIONS: 1) High fiber diet.  REPEAT EXAM:  In 10 year(s) for.  ______________________________ Hedwig Morton. Juanda Chance, MD  CC:  n. REVISED:  10/02/2011 08:55 AM eSIGNED:   Hedwig Morton. Burgundy Matuszak at 10/02/2011 08:55 AM  [name], [name], [phone]

## 2011-10-02 NOTE — Patient Instructions (Signed)
Discharge instructions per blue and green sheets  Normal colonoscopy today- repeat exam in 10 years-2023. We will mail you a letter to remind you of this  Handout given on high fiber diet

## 2011-10-03 ENCOUNTER — Ambulatory Visit (INDEPENDENT_AMBULATORY_CARE_PROVIDER_SITE_OTHER): Payer: Medicare Other | Admitting: Family Medicine

## 2011-10-03 ENCOUNTER — Ambulatory Visit: Payer: Medicare Other

## 2011-10-03 ENCOUNTER — Encounter: Payer: Self-pay | Admitting: Family Medicine

## 2011-10-03 VITALS — BP 139/77 | HR 67 | Temp 98.1°F | Resp 16 | Ht 65.0 in | Wt 170.0 lb

## 2011-10-03 DIAGNOSIS — S41109A Unspecified open wound of unspecified upper arm, initial encounter: Secondary | ICD-10-CM

## 2011-10-03 DIAGNOSIS — W540XXA Bitten by dog, initial encounter: Secondary | ICD-10-CM

## 2011-10-03 DIAGNOSIS — M545 Low back pain: Secondary | ICD-10-CM

## 2011-10-03 DIAGNOSIS — M79609 Pain in unspecified limb: Secondary | ICD-10-CM

## 2011-10-03 MED ORDER — AMOXICILLIN-POT CLAVULANATE 875-125 MG PO TABS: 1.0000 | ORAL_TABLET | Freq: Two times a day (BID) | ORAL | Status: AC

## 2011-10-03 MED ORDER — HYDROCODONE-ACETAMINOPHEN 5-500 MG PO TABS: 1.0000 | ORAL_TABLET | Freq: Three times a day (TID) | ORAL | Status: DC | PRN

## 2011-10-03 NOTE — Progress Notes (Signed)
   Patient ID: GERMAINE RIPP MRN: [id number], DOB: 02/20/1946, 66 y.o. Date of Encounter: 10/03/2011, 2:58 PM   PROCEDURE NOTE: Verbal consent obtained. Sterile technique employed. Numbing: Anesthesia obtained with 2% lidocaine with epi. 4 cc total. Cleansed with soap and water. Irrigated. Betadine prep per usual protocol.  Wound explored, no deep structures involved, no foreign bodies.   Wound repaired with # 7 5-0 Ethilon. Hemostasis obtained. Wound cleansed and dressed.  Wound care instructions including precautions covered with patient. Handout given.  Anticipate suture removal in 10 days  Signed, Eula Listen, PA-C 10/03/2011 2:58 PM

## 2011-10-03 NOTE — Progress Notes (Signed)
  Patient Name: Carly Rogers2 Date of Birth: 10/24/1945 Medical Record Number: [id number] Gender: female Date of Encounter: 10/03/2011  History of Present Illness:  [name] Carly Rhodes is a 66 y.o. very pleasant female patient who presents with the following:  Bitten by a dog around 11:20 this am- by her neighbor's dog.  Dog is up to date on all vaccinations.  Dog was protective of puppies and bit neighbor.  She did have a tetanus shot about 3 years ago when she cut her hand.  She has bite on her left wrist which is more serious and smaller bites on her right leg.  Otherwise is unhurt.  Bite report done  Patient Active Problem List  Diagnoses  . HSV  . 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 NONTOXIC MULTINODULAR GOITER  . DYSTHYMIA  . LOW BACK PAIN SYNDROME  . OSTEOPOROSIS  . HYPERCHOLESTEROLEMIA   Past Medical History  Diagnosis Date  . Depression   . Seasonal allergies   . Osteoporosis    Past Surgical History  Procedure Date  . Abdominal hysterectomy 1984  . Hammer toe surgery 1993    2nd toe left foot   History  Substance Use Topics  . Smoking status: Never Smoker   . Smokeless tobacco: Never Used  . Alcohol Use: No   Family History  Problem Relation Age of Onset  . Diabetes Father   . Hypertension Sister   . Heart attack Neg Hx   . Stomach cancer Brother 61  . Colon cancer Paternal Grandmother 21   No Known Allergies  Medication list has been reviewed and updated.  Review of Systems: As per HPI- otherwise negative   Physical Examination: Filed Vitals:   10/03/11 1301  BP: 139/77  Pulse: 67  Temp: 98.1 F (36.7 C)  TempSrc: Oral  Resp: 16  Height: 5\' 5"  (1.651 m)  Weight: 170 lb (77.111 kg)    Body mass index is 28.29 kg/(m^2).   GEN: WDWN, NAD, Non-toxic, Alert & Oriented x 3 HEENT: Atraumatic, Normocephalic.  Ears and Nose: No external deformity. EXTR: No clubbing/cyanosis/edema NEURO: Normal gait.  PSYCH: Normally interactive.  Conversant. Not depressed or anxious appearing.  Calm demeanor.  Left wrist has an aprox 1 inch laceration on the radial side.  It will come together well but is currently gaping open.  The hand has normal ROM, strength and sensation.  There is a small shallow wound on her right calf and a bruised/ abraded area on her right thigh.   [facility] reading (PRIMARY) by  Dr. Patsy Lager.  Negative left wrist.    Assessment and Plan: Wound to left wrist from a dog bite.  Repaired as in note by Eula Listen, PA- C. Cover with augmentin 875 BID for 10 days, also gave a limited supply of Vicodin per her request.  Declined xray at this time.  Patient returned to clinic about 2 hours after discharge home complaining of severe pain in her wrist.  We did do an xray which was negative.  Applied a non- spica velcro splint for comfort. She felt better and said that her vicodin was starting to work.  I looked at her wound which looked great- closed very nicely by Eula Listen PA- C.

## 2011-10-05 ENCOUNTER — Telehealth: Payer: Self-pay | Admitting: *Deleted

## 2011-10-05 NOTE — Telephone Encounter (Signed)
Left message to call if needed. 

## 2011-10-12 ENCOUNTER — Ambulatory Visit (INDEPENDENT_AMBULATORY_CARE_PROVIDER_SITE_OTHER): Payer: Medicare Other | Admitting: Family Medicine

## 2011-10-12 VITALS — BP 128/79 | HR 64 | Temp 98.0°F | Resp 18

## 2011-10-12 DIAGNOSIS — Z4802 Encounter for removal of sutures: Secondary | ICD-10-CM

## 2011-10-12 NOTE — Progress Notes (Signed)
[facility]:  Office Visit  Chief Complaint:  Chief Complaint  Patient presents with  . Suture / Staple Removal    on wrist    HPI: Carly Rhodes is a 66 y.o. female who is here for suture removal s/p dogbite. No sxs of fevers, chills, infection.  Past Medical History  Diagnosis Date  . Depression   . Seasonal allergies   . Osteoporosis    Past Surgical History  Procedure Date  . Abdominal hysterectomy 1984  . Hammer toe surgery 1993    2nd toe left foot   History   Social History  . Marital Status: Married    Spouse Name: N/A    Number of Children: N/A  . Years of Education: N/A   Social History Main Topics  . Smoking status: Never Smoker   . Smokeless tobacco: Never Used  . Alcohol Use: No  . Drug Use: No  . Sexually Active: None   Other Topics Concern  . None   Social History Narrative  . None   Family History  Problem Relation Age of Onset  . Diabetes Father   . Hypertension Sister   . Heart attack Neg Hx   . Stomach cancer Brother 35  . Colon cancer Paternal Grandmother 42   No Known Allergies Prior to Admission medications   Medication Sig Start Date End Date Taking? Authorizing Provider  ALPRAZolam Prudy Feeler) 0.5 MG tablet Take 1 tablet at bedtime as needed for sleep 08/03/11 08/02/12 Yes Michele Mcalpine, MD  amoxicillin-clavulanate (AUGMENTIN) 875-125 MG per tablet Take 1 tablet by mouth 2 (two) times daily. 10/03/11 10/13/11 Yes Jessica Copland, MD  b complex vitamins tablet Take 1 tablet by mouth daily.   Yes Historical Provider, MD  Cholecalciferol (VITAMIN D PO) Take by mouth daily.   Yes Historical Provider, MD  HYDROcodone-acetaminophen (VICODIN) 5-500 MG per tablet Take 1 tablet by mouth every 8 (eight) hours as needed for pain. 10/03/11 10/02/12 Yes Abbe Amsterdam, MD  Multiple Vitamin (MULTIVITAMIN) tablet Take 1 tablet by mouth daily.   Yes Historical Provider, MD  Multiple Vitamin (VITAMIN E/FOLIC ACID/B-6/B-12 PO) Take  by mouth daily.   Yes Historical Provider, MD     ROS: The patient denies fevers, chills, night sweats, unintentional weight loss, chest pain, palpitations, wheezing, dyspnea on exertion, nausea, vomiting, abdominal pain, dysuria, hematuria, melena, numbness, weakness, or tingling.   All other systems have been reviewed and were otherwise negative with the exception of those mentioned in the HPI and as above.    PHYSICAL EXAM: Filed Vitals:   10/12/11 0857  BP: 128/79  Pulse: 64  Temp: 98 F (36.7 C)  Resp: 18   There were no vitals filed for this visit. There is no height or weight on file to calculate BMI.  General: Alert, no acute distress HEENT:  Normocephalic, atraumatic, oropharynx patent. Cardiovascular:  Regular rate and rhythm, no rubs murmurs or gallops.  No Carotid bruits, radial pulse intact. No pedal edema.  Respiratory: Clear to auscultation bilaterally.  No wheezes, rales, or rhonchi.  No cyanosis, no use of accessory musculature GI: No organomegaly, abdomen is soft and non-tender, positive bowel sounds.  No masses. Skin: No rashes. Left wrist sutures, clean , good approxiamtion, no signs of infection Neurologic: Facial musculature symmetric. Psychiatric: Patient is appropriate throughout our interaction. Musculoskeletal: Gait intact   ASSESSMENT/PLAN: Encounter Diagnosis  Name Primary?  . Visit for suture removal Yes   Removed sutures without complication. Wound  care BID as instructed.    Asberry Lascola PHUONG, DO 10/12/2011 9:22 AM

## 2011-11-16 ENCOUNTER — Telehealth: Payer: Self-pay | Admitting: Pulmonary Disease

## 2011-11-16 MED ORDER — VALACYCLOVIR HCL 500 MG PO TABS: 500.0000 mg | ORAL_TABLET | Freq: Three times a day (TID) | ORAL | Status: AC

## 2011-11-16 MED ORDER — AZITHROMYCIN 250 MG PO TABS: ORAL_TABLET | ORAL | Status: AC

## 2011-11-16 NOTE — Telephone Encounter (Signed)
I spoke with Carly Rhodes and she c/o nasal congestion, chest congestion, dry hacking cough, blowing out yellow tint phlem, some sore throat, headache x Friday. Denies any chills, sweats, fever, body aches. Carly Rhodes is taking zyrtec. Carly Rhodes also states she has a fever blister and wants rx for this. Carly Rhodes is requesting a zpak to be called in as well. Carly Rhodes was also requesting to be seen today but nothing avaliable. Please advise Dr. Kriste Basque  No Known Allergies   [facility] [place]

## 2011-11-16 NOTE — Telephone Encounter (Signed)
I spoke with pt and is aware of SN recs. She voiced her understanding and had no questions. Rx has been sent and nothing further was needed

## 2011-11-16 NOTE — Telephone Encounter (Signed)
Pt states she needs a rx for a fever blister.Carly Rhodes

## 2011-11-16 NOTE — Telephone Encounter (Signed)
Per SN---ok for zpak #1  Take as directed with no refills and valtrex 500mg   #15   1 po tid until gone.  thanks

## 2011-12-25 ENCOUNTER — Other Ambulatory Visit: Payer: Self-pay | Admitting: Adult Health

## 2012-02-05 ENCOUNTER — Other Ambulatory Visit: Payer: Self-pay | Admitting: Pulmonary Disease

## 2012-03-17 ENCOUNTER — Other Ambulatory Visit: Payer: Self-pay | Admitting: Pulmonary Disease

## 2012-03-17 DIAGNOSIS — Z1231 Encounter for screening mammogram for malignant neoplasm of breast: Secondary | ICD-10-CM

## 2012-03-18 ENCOUNTER — Ambulatory Visit: Payer: Medicare Other

## 2012-03-28 ENCOUNTER — Ambulatory Visit
Admission: RE | Admit: 2012-03-28 | Discharge: 2012-03-28 | Disposition: A | Discharge status: Home / Self Care | Payer: Medicare Other | Source: Ambulatory Visit | Attending: Pulmonary Disease | Admitting: Pulmonary Disease

## 2012-03-28 DIAGNOSIS — Z1231 Encounter for screening mammogram for malignant neoplasm of breast: Secondary | ICD-10-CM

## 2012-04-22 ENCOUNTER — Encounter: Payer: Self-pay | Admitting: Adult Health

## 2012-04-22 ENCOUNTER — Telehealth: Payer: Self-pay | Admitting: Pulmonary Disease

## 2012-04-22 ENCOUNTER — Ambulatory Visit (INDEPENDENT_AMBULATORY_CARE_PROVIDER_SITE_OTHER): Payer: Medicare Other | Admitting: Adult Health

## 2012-04-22 ENCOUNTER — Ambulatory Visit (INDEPENDENT_AMBULATORY_CARE_PROVIDER_SITE_OTHER)
Admission: RE | Admit: 2012-04-22 | Discharge: 2012-04-22 | Disposition: A | Discharge status: Home / Self Care | Payer: Medicare Other | Source: Ambulatory Visit | Attending: Adult Health | Admitting: Adult Health

## 2012-04-22 VITALS — BP 110/80 | HR 72 | Temp 97.4°F | Ht 65.5 in | Wt 181.0 lb

## 2012-04-22 DIAGNOSIS — W19XXXA Unspecified fall, initial encounter: Secondary | ICD-10-CM

## 2012-04-22 DIAGNOSIS — R0781 Pleurodynia: Secondary | ICD-10-CM

## 2012-04-22 DIAGNOSIS — M545 Low back pain: Secondary | ICD-10-CM

## 2012-04-22 DIAGNOSIS — R079 Chest pain, unspecified: Secondary | ICD-10-CM

## 2012-04-22 MED ORDER — HYDROCODONE-ACETAMINOPHEN 5-500 MG PO TABS: 1.0000 | ORAL_TABLET | Freq: Three times a day (TID) | ORAL | Status: DC | PRN

## 2012-04-22 NOTE — Progress Notes (Signed)
Subjective:    Patient ID: Carly Rhodes, female    DOB: 11/21/1945, 66 y.o.   MRN: [id number]  HPI 66 year old female patient of Dr. Kriste Basque with known history of depression   March 08, 2009 --Returns for follow up and discuss depression and anxiety. Has been under alot of stress. Going through bitter divorce, was married >20yrs. Stopped Prozac 6 months ago, doing better but over last month, more agitatied, anxidous, worrying alot, trouble sleeping, mind racing. Mild low feelings, more anxiety. Prozac helped with that. She is seeing Dr. Lysle Dingwall for couseling.   April 02, 2009--Presents for acute office visit. Complains of sore throat, bilateral ear ache. pt states onset since she had the "flu 2 weeks ago" w/ cough, congesiton, sore throat and body aches, subjective fever. June 17, 2009--Presents for work in visit. Complains that she is leaving for [place] soon and does not want to be sick. Complains of productive cough with mucus, wheezing, "rattling in chest" x5days. OTC not working. Denies chest pain, dyspnea, orthopnea, hemoptysis, fever, n/v/d, edema, headache.   ~ July 05, 2009: she notes "last winter was bad- I had the flu 4 times"... c/o recent URI w/ ZPak given + Zyrtek, Nasacort/ Saline... c/o persistant symptoms and leaving for [place] in 3d... ** we discussed Rx w/ Depo80 + Medrol Dosepak and waiting on her flu shot til return...   September 05, 2010 --Presents for an acute office visit. Complains of sinus pressure/congestion, bilat ear discomfort, clear nasal drainage, PND w/ cough, sneezing x1week. OTC not helping .Need refills and to be scheduled for physical. Otc not helping   04/22/2012 Acute OV  Patient presents for an acute office visit. Patient complains of right-sided rib and breast pain. Patient fell earlier today when she tripped in the yard in which her foot twisted Complains that her right side along her rib cage, and breast area, are very sore She denies any cough,  shortness of breath, hemoptysis, abdominal pain, nausea, vomiting.  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  HEENT:   No headaches,  Difficulty swallowing,  Tooth/dental problems, or  Sore throat,                No sneezing, itching, ear ache, nasal congestion, post nasal drip,   CV:  No chest pain,  Orthopnea, PND, swelling in lower extremities, anasarca, dizziness, palpitations, syncope.   GI  No heartburn, indigestion, abdominal pain, nausea, vomiting, diarrhea, change in bowel habits, loss of appetite, bloody stools.   Resp: No shortness of breath with exertion or at rest.  No excess mucus, no productive cough,  No non-productive cough,  No coughing up of blood.  No change in color of mucus.  No wheezing.  No chest wall deformity  Skin: no rash or lesions.  GU: no dysuria, change in color of urine, no urgency or frequency.  No flank pain, no hematuria   MS:  No joint pain or swelling.  No decreased range of motion.  No back pain.  Psych:  No change in mood or affect. No depression or anxiety.  No memory loss.         Objective:   Physical Exam GEN: A/Ox3; pleasant , NAD, well nourished   HEENT:  [place]/AT,  EACs-clear, TMs-wnl, NOSE-clear, THROAT-clear, no lesions, no postnasal drip or exudate noted.   NECK:  Supple w/  fair ROM; no JVD; normal carotid impulses w/o bruits; palpable goiter (chronic)  no lymphadenopathy.  RESP  Clear  P & A; w/o, wheezes/ rales/ or rhonchi.no accessory muscle use, no dullness to percussion Tender along right lateral ribs and breast, no obvious deformity, no bruising.   CARD:  RRR, no m/r/g  , no peripheral edema, pulses intact, no cyanosis or clubbing.  GI:   Soft & nt; nml bowel sounds; no organomegaly or masses detected.  Musco: Warm bil, no deformities or joint swelling noted.   Neuro: alert, no focal deficits noted.    Skin: Warm, no lesions or rashes         Assessment & Plan:

## 2012-04-22 NOTE — Assessment & Plan Note (Addendum)
Right rib pain/contusion s/p fall   Check xray today -ribs film on right   Plan;  Alternate ice and heat to ribs Advil 200mg  2 tabs Twice daily  For 5 days w/ food  Vicodin 1 every 6 hr As needed  Pain , may cause sleepiness.  Please contact office for sooner follow up if symptoms do not improve or worsen or seek emergency care  Follow up Dr. Kriste Basque  For physical in 3 months.

## 2012-04-22 NOTE — Patient Instructions (Addendum)
Alternate ice and heat to ribs Advil 200mg  2 tabs Twice daily  For 5 days w/ food  Vicodin 1 every 6 hr As needed  Pain , may cause sleepiness.  Please contact office for sooner follow up if symptoms do not improve or worsen or seek emergency care  Follow up Dr. Kriste Basque  For physical in 3 months.

## 2012-04-22 NOTE — Telephone Encounter (Signed)
I spoke with pt and she stated she fell in her front yard about 1 hour ago--stated she hit the ground hard. Now she is having right breast pain that comes and goes. Worse when she moves a certain way/coughing. Pt is scheduled to come in to be evaluated to make sure she did not break anything at 2:30 today with TP.

## 2012-04-26 ENCOUNTER — Telehealth: Payer: Self-pay | Admitting: Pulmonary Disease

## 2012-04-26 ENCOUNTER — Ambulatory Visit (INDEPENDENT_AMBULATORY_CARE_PROVIDER_SITE_OTHER): Payer: Medicare Other | Admitting: Sports Medicine

## 2012-04-26 VITALS — BP 110/66 | Ht 65.0 in | Wt 170.0 lb

## 2012-04-26 DIAGNOSIS — R0781 Pleurodynia: Secondary | ICD-10-CM

## 2012-04-26 DIAGNOSIS — M25539 Pain in unspecified wrist: Secondary | ICD-10-CM

## 2012-04-26 DIAGNOSIS — R079 Chest pain, unspecified: Secondary | ICD-10-CM

## 2012-04-26 DIAGNOSIS — M79646 Pain in unspecified finger(s): Secondary | ICD-10-CM | POA: Insufficient documentation

## 2012-04-26 DIAGNOSIS — M79609 Pain in unspecified limb: Secondary | ICD-10-CM

## 2012-04-26 NOTE — Assessment & Plan Note (Signed)
She will use a wrist brace with a thumb spica for the next 3 weeks  Leave this on as much as possible  At the end of that time we'll try the scanning with ultrasound to see if the ligament is stable  Recheck in 3 weeks

## 2012-04-26 NOTE — Assessment & Plan Note (Signed)
We may need to scan her first compartment to see if she has some scar tissue causing adhesions to the abductor pollicis longus or the extensor pollicis brevis  Use splint until return to clinic

## 2012-04-26 NOTE — Telephone Encounter (Signed)
I spoke with JJ and is aware of this

## 2012-04-26 NOTE — Progress Notes (Signed)
  Subjective:    Patient ID: Carly Rhodes, female    DOB: 03/07/1946, 66 y.o.   MRN: [id number]  HPI Patient was attacked by dogs approximately 2 months ago She does not know the specific day [facility] and a laceration over her left wrist repaired over the first compartment Since then she has some sharp pains that run down into the thumb Within a couple days of the injury she also noted that her left forearm and her hand grip was weaker She had difficulty picking up a coffee cup or with strong grip using her left hand  She comes for evaluation of this  A secondary issue is a small 3 days ago that led her to have pain along her right breast and ribs She still has a lot of pain with moving or rolling over   Review of Systems     Objective:   Physical Exam  Not in acute distress  Left wrist shows a scar and a healed laceration over the first compartment about 3 cm above the joint line  With flexion and extension of the left thumb she shows abnormal motion with the thumb going into abduction Testing of the ulnar collateral ligament of the left hand reveals abnormal laxity that is not present over the right hand She has tenderness at the base of the right thumb more on the ulnar side      Assessment & Plan:

## 2012-04-26 NOTE — Assessment & Plan Note (Signed)
Large Ace wrap given to wrap around her chest for comfort

## 2012-05-17 ENCOUNTER — Ambulatory Visit (INDEPENDENT_AMBULATORY_CARE_PROVIDER_SITE_OTHER): Payer: Medicare Other | Admitting: Sports Medicine

## 2012-05-17 ENCOUNTER — Encounter: Payer: Self-pay | Admitting: Sports Medicine

## 2012-05-17 VITALS — BP 122/84 | HR 77

## 2012-05-17 DIAGNOSIS — M65839 Other synovitis and tenosynovitis, unspecified forearm: Secondary | ICD-10-CM

## 2012-05-17 DIAGNOSIS — M25539 Pain in unspecified wrist: Secondary | ICD-10-CM

## 2012-05-17 DIAGNOSIS — M79646 Pain in unspecified finger(s): Secondary | ICD-10-CM

## 2012-05-17 DIAGNOSIS — M79609 Pain in unspecified limb: Secondary | ICD-10-CM

## 2012-05-17 NOTE — Assessment & Plan Note (Signed)
Consent obtained and verified. Sterile betadine prep. Furthur cleansed with alcohol. Topical analgesic spray: Ethyl chloride. Joint: left wrist cmpt 1 Approached in typical fashion with: US guidance into the tendon sheath Completed without difficulty Meds: 1/2 cc kenalog 10 and 1/2 cc lidocaine 1 % Needle: 30 G Aftercare instructions and Red flags advised.  We were able to inject the sheath and see that the fluid injured this on the ultrasound  She should continue the thumb spica splint  After one week she can start working this in warm water and start easy exercises such as squeezing a soft rubber ball  Recheck in one month

## 2012-05-17 NOTE — Progress Notes (Signed)
  Subjective:    Patient ID: Carly Rhodes, female    DOB: 10/01/1945, 66 y.o.   MRN: [id number]  HPI Pt states that she has had severe pain with L thumb movement since she was bit by a dog about 4 weeks ago.  She has been wearing a thumb spica splint which has offered great relief.  Has not used ice. No NSAIDs.  States that pain at L thumb often radiates proximally  On her last visit it seemed that there was some pain directly over the laceration from the dog bite There is also pain along the ulnar collateral ligament of the thumb and it was unstable The only injury she could relate to these 2 problems was the the dog bite She had not had any hand or wrist pain before   Review of Systems All negative as in HPI   Objective:   Physical Exam No acute distress  L hand: no edema or erythema noted Scar about 4cm proximal to [facility] joint around the first compartment of the wrist Non-tender at [facility] Tender 2cm proximal to [facility], tender over scar +Finkelstein's  Laxity noted at ulnar collateral ligament of L hand but this is much less than before  Ultrasound Scans of the first compartment of the wrist show significant tenosynovitis with hypoechoic change around the tendons The area directly under her laceration scar shows a small partial tear in the tendon and a fluid pocket The ulnar collateral ligament of the thumb appears intact today and the degree of movement is not abnormal bone scan      Assessment & Plan:  1. L thumb pain- partial rupture of tendon of extensor pollicis brevis/abductor pollicis longus tendon noted on U/S, component of DeQuervain's tenosynovitis; kenalog/lidocaine injection administered.  Pt is to f/u in 1 month

## 2012-05-17 NOTE — Assessment & Plan Note (Signed)
I think the ulnar collateral ligament is healing and the motion is now much more normal  She should use the thumb spica for at least 2 more weeks for this

## 2012-05-17 NOTE — Patient Instructions (Addendum)
You have a partial tendon tear exactly where the dog bit you which caused swelling of the tendon   Please follow up in 1 month

## 2012-06-23 ENCOUNTER — Ambulatory Visit (INDEPENDENT_AMBULATORY_CARE_PROVIDER_SITE_OTHER): Payer: Medicare Other

## 2012-06-23 DIAGNOSIS — Z23 Encounter for immunization: Secondary | ICD-10-CM

## 2012-07-21 ENCOUNTER — Encounter: Payer: Self-pay | Admitting: *Deleted

## 2012-07-26 ENCOUNTER — Encounter: Payer: Self-pay | Admitting: *Deleted

## 2012-07-27 ENCOUNTER — Ambulatory Visit (INDEPENDENT_AMBULATORY_CARE_PROVIDER_SITE_OTHER): Payer: Medicare Other | Admitting: Pulmonary Disease

## 2012-07-27 ENCOUNTER — Encounter: Payer: Self-pay | Admitting: Pulmonary Disease

## 2012-07-27 VITALS — BP 120/80 | HR 72 | Temp 98.6°F | Ht 65.0 in | Wt 181.6 lb

## 2012-07-27 DIAGNOSIS — M545 Low back pain: Secondary | ICD-10-CM

## 2012-07-27 DIAGNOSIS — E7800 Pure hypercholesterolemia, unspecified: Secondary | ICD-10-CM

## 2012-07-27 DIAGNOSIS — M810 Age-related osteoporosis without current pathological fracture: Secondary | ICD-10-CM

## 2012-07-27 DIAGNOSIS — F341 Dysthymic disorder: Secondary | ICD-10-CM

## 2012-07-27 DIAGNOSIS — E042 Nontoxic multinodular goiter: Secondary | ICD-10-CM

## 2012-07-27 MED ORDER — ALENDRONATE SODIUM 70 MG PO TABS: 70.0000 mg | ORAL_TABLET | ORAL | Status: DC

## 2012-07-27 NOTE — Progress Notes (Addendum)
Subjective:     Patient ID: Carly Rhodes, female   DOB: 01/22/1946, 66 y.o.   MRN: [id number]  HPI 66 y/o WF here for a follow up visit... he has multiple medical problems as noted below...    ~  Mar10:  she presents today w/ "intestinal flu" stating that sh'e had 2d hx of feeling cold, N/ V/ D, aching all over, headache, now watery diarrhea w/o blood etc... denies f/c/s... states liquids "go right thru me" & "this is way past immodium" (she hasn't tried any OTC meds for her symptoms)...  she was seen by TP in Jan10 w/ URI and Rx w/ ZPak, then Augmentin- no problems after these meds... no other more recent antibiotics, no recent travel, no sick exposures...  we discussed gastroenteritis:  eval w/ lab work, check stools;  treatment w/ clear liq's, tylenol, lomotil sparingly...  ~  July 05, 2009:  she notes "last winter was bad- I had the flu 4 times"... c/o recent URI w/ ZPak given + Zyrtek, Nasacort/ Saline... c/o persistant symptoms and leaving for [place] in 3d... we discussed Rx w/ Depo80 + Medrol Dosepak and waiting on her flu shot til return...  ~  October 31, 2010:  57mo ROV & generally stable; requests refills as noted + ZPak to keep on hand; due for CXR, fasting blood work, & BMD...    Thyroid>  Prev eval as noted- multinod gland w/ bx ordered per DrDickstein & done by IR, DrYamagata was very difficult & painful according to the pt (benign non-neoplastic goiter);  she recalls prev throid eval by ENT w/ bx by ?DrByers that was easy;  serial TFTs w/ TSH in the low normal range;  TSH today = 0.23 & will need repeat w/ FreeT3 & FreeT4=> she never returned for the f/u labs...    Hx LBP>  she sees Chiropractor & DrFields for sports med...    Dysthymia>  remains on Prozac40 & Alpraz 0.5 Prn;  notes that these meds continue to help & she requests continued refills...  ~  July 27, 2012:  89mo ROV & [name] is concerned about a right subconjunctival hem- seen by optometrist in [place] & reassured;  she has mult somatic complaints- worn out & tired, needs a new Gyn, needs a BMD, etc... We reviewed the following problems for today's visit>>    Chol> on diet alone; her FLP remains markedly abn w/ today's FLP showing TChol 242, TG 162, HDL 43, LDL 169; we discussed her options 7 she has agreed to try Crestor10mg /d=> plan f/u FLP/ Liver profile in 2-3 mo...    Thyroid> Hx multinod thyroid w/ bx 2009 by IR showing benign follicular cells; last imaged 2009 w/ 2.4cm nodule on left & 1.7cm nodule on right; TSH is been borderline low previously; palp nodules L>R & she needs re-imaging; labs show TSH=0.44, FreeT3=2.6 (2.3-4.2), FreeT4=0.86 (0.60-1.60)...    Ortho- LBP, wrist> she is managed by DrFields & seen 10/13 w/ left thumb pain ever since dog bite 71mo prior; Dx w/ tenosynovitis & part tendon tear, given injection & splint; prev LBP after fall & eval in [place] by [facility] 5/12- Rx Vicodin & Aleve...    Osteop> BMD 6/09 at [facility]- TScores -2.9 in Spine, and -2.1 in left [facility]; treated w/ Alendronate 70mg /wk but pt stopped on her own "I'm on a sabbatical" and using calcium, MVI, VitD; we discussed recheck BMD here=> pending...    Anxiety> she is followed by Psychiatry (couldn't name the doctor) on  one med that she couldn't name either (didn't bring bottles, didn't bring list)... We reviewed prob list, meds, xrays and labs> see below for updates >> she had the 2013 Flu vaccine 11/13... LABS 12/13:  FLP remains deranged on diet alone w/ TChol=242 & LDL=169;  Chems- wnl;  CBC- wnl;  Thyroid- normal...    ADDENDUM>> Thyroid Sonar 12/13 showed multinod gland w/ most nodules unchanged; but NEW 3.4cm dominant left lower pole nodule & Bx is rec...   Current Problems:   HYPERCHOLESTEROLEMIA (ICD-272.0) - on diet, exercise & OTC meds;  she prev refused to consider prescription drug therapy for this problem... ~  FLP 3/12 showed TChol 242, TG 142, HDL 56, LDL 157... she refuses med rx. ~  FLP 12/13 on diet  alone showed TChol 242, TG 162, HDL 43, LDL 169... She has agreed to try CRESTOR 10mg /d.  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 we do not have the details of the prev eval (?by ENT DrByers?)... TFT's here were normal--- looks like she had a thyroid bx by DrYamagata ordered by DrDickstein 6/09= c/w hyperplastic nodule/ non-neoplastic goiter (this bx was difficult & painful she recalls)... ~  labs 3/08 showed TSH = 0.47 ~  labs 3/09 showed TSH = 0.55 ~  Thyroid Sonar 5/09 showed diffusely abn thyroid c/w multinod gland & bilat biopsies done 6/09 revealing follicular cells/ non-neoplastic goiter... ~  labs 3/10 showed TSH= 0.43 ~  labs 3/12 showed TSH= 0.23=> she never ret for f/u labs as requested. ~  Labs 12/13 showed TSH=0.44, FreeT3=2.6 (2.3-4.2), FreeT4=0.86 (0.60-1.60)... ~  f/u Thyroid Sonar 12/13 showed multinod gland w/ most nodules unchanged; but NEW 3.4cm dominant left lower pole nodule & Bx is rec...  COLONOSCOPY >> she had a routine screening colonoscopy 2/13 by DrDBrodie- it was normal, no polyps or lesions, no divertics etc... F/u planned 58yrs.  GALLSTONES >> multiple small gallstones were noted on a CXR 9/13... Hx of HEPATITIS A >> age 38, no known residual problems...  Hx of LOW BACK PAIN SYNDROME (ICD-724.2) - prev evals from chiropractor and DrFields for sports medicine...   DYSTHYMIA (ICD-300.4) - prev taking Xanax 0.5mg  Qhs Prn and Fluoxetine 40mg /d... she states "I've had a melt-down & left my husb after 10yrs of marriage"... she is seeing Dr. Garald Braver psychologist for counselling help "he says it's grief"... she is requesting to change to her sister's medication = EffexorXR, instead of Lexapro... ~  2009: we accommodated her requests- tried Lexapro- too $$$, Effexor- caused nightmares, then Zoloft, and now on generic Prozac 40mg /d...  ~  11/10: she remains on Prozac40mg /d & wishes to continue Rx as she feels  this is continuing to help... ~  3/12:  ditto- continues on Prozac40 & Alpraz 0.5mg  Prn & requests refills... ~  12/13:  She is off the prev Xanax & Prozac- apparently she is seeing a Psychiatrist but she couldn't tell me who or what med they changed her to...  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 she went to plastic surg at [facility] w/ lipoma excision and removal of dystrophic right great toenail by DrMarks in 2009.  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  HEALTH MAINTENANCE:   ~  GI:  she states that she had a colonoscopy in [facility] in 2007... we do not have this report....  ~  GYN:  routine GYN- saw DrDickstein in 2009... [facility]=neg), but hasn't had a BMD- "they don't work, it was in the news"--- finally had BMD 6/09 by [facility]= TScores -2.9 in spine,  & -2.1 in left [facility] w/ rec for Rx by DrDickstein on ALENDRONATE... Vit D level 3/10= 41...  ~  IMMUNIZ:  She gets the yearly flu vaccine;     Past Surgical History  Procedure Date  . Abdominal hysterectomy 1984  . Hammer toe surgery 1993    2nd toe left foot    Outpatient Encounter Prescriptions as of 07/27/2012  Medication Sig Dispense Refill  . alendronate (FOSAMAX) 70 MG tablet TAKE ONE TABLET BY MOUTH ONCE A WEEK  4 tablet  1  . ALPRAZolam (XANAX) 0.5 MG tablet Take 1 tablet at bedtime as needed for sleep  30 tablet  3  . b complex vitamins tablet Take 1 tablet by mouth daily.      . Cholecalciferol (VITAMIN D PO) Take by mouth daily.      . Multiple Vitamin (MULTIVITAMIN) tablet Take 1 tablet by mouth daily.      Marland Kitchen HYDROcodone-acetaminophen (VICODIN) 5-500 MG per tablet Take 1 tablet by mouth every 8 (eight) hours as needed for pain.  15 tablet  0    No Known Allergies   Current Medications, Allergies, Past Medical History, Past Surgical History,  Family History, and Social History were reviewed in [employer] record.   Review of Systems        The patient complains of gas/bloating, back pain, stiffness, arthritis, depression, and hay fever.  The patient denies fever, chills, sweats, anorexia, fatigue, weakness, malaise, weight loss, sleep disorder, blurring, diplopia, eye irritation, eye discharge, vision loss, eye pain, photophobia, earache, ear discharge, tinnitus, decreased hearing, nasal congestion, nosebleeds, sore throat, hoarseness, chest pain, palpitations, syncope, dyspnea on exertion, orthopnea, PND, peripheral edema, cough, dyspnea at rest, excessive sputum, hemoptysis, wheezing, pleurisy, nausea, vomiting, diarrhea, constipation, change in bowel habits, abdominal pain, melena, hematochezia, jaundice, indigestion/heartburn, dysphagia, odynophagia, dysuria, hematuria, urinary frequency, urinary hesitancy, nocturia, incontinence, joint pain, joint swelling, muscle cramps, muscle weakness, sciatica, restless legs, leg pain at night, leg pain with exertion, rash, itching, dryness, suspicious lesions, paralysis, paresthesias, seizures, tremors, vertigo, transient blindness, frequent falls, frequent headaches, difficulty walking, anxiety, memory loss, confusion, cold intolerance, heat intolerance, polydipsia, polyphagia, polyuria, unusual weight change, abnormal bruising, bleeding, enlarged lymph nodes, urticaria, allergic rash, and recurrent infections.     Objective:   Physical Exam     WD, WN, 66 y/o WF in NAD... GENERAL:  Alert & oriented; pleasant & cooperative... HEENT:  [place]/AT, EOM-full, PERRLA, EACs-clear, TMs-normal, NOSE-sl pale, THROAT-clear & wnl. NECK:  Supple w/ fairROM; no JVD; normal carotid impulses w/o bruits; +thyromegaly w/ coarse bilat nodularity; no lymphadenopathy. CHEST:  Coarse BS w/ no wheezes, rales, or signs of consolidation. HEART:  Regular Rhythm; without murmurs/ rubs/ or  gallops. ABDOMEN:  Soft & nontender; incr bowel sounds; no organomegaly or masses detected. EXT: without deformities or arthritic changes; no varicose veins/ venous insuffic/ or edema. NEURO:  no focal neuro deficits... DERM: no skin lesions identified...  RADIOLOGY DATA:  Reviewed in the EPIC EMR & discussed w/ the patient...  LABORATORY DATA:  Reviewed in the EPIC EMR & discussed w/ the patient...   Assessment:      Chol>  FLP remains deranged on diet alone & she seems genuinely surprized since she exercises all the time; explained  the hereditary nature of the prob & need for meds; she agrees to tryu CRESTOR10mg /d...  Thyroid>  Thyroid function is wnl, nodules are palp & needs re-imagining to see if nodules have enlarged=> Sonar 12/13 showed most nodules stable in size but dom nodule left lower pole is NEW & they rec Bx...  Ortho- LBP, wrist> she is managed by DrFields & is improved overall...  Osteop> BMD 6/09 at [facility]- TScores -2.9 in Spine, and -2.1 in left [facility]; treated w/ Alendronate 70mg /wk but pt stopped on her own "I'm on a sabbatical" and using calcium, MVI, VitD; we discussed recheck BMD here=> pending...  Anxiety> she is followed by Psychiatry (couldn't name the doctor) on one med that she couldn't name either (didn't bring bottles, didn't bring list)...     Plan:     Patient's Medications  New Prescriptions   No medications on file  Previous Medications   ALPRAZOLAM (XANAX) 0.5 MG TABLET    Take 1 tablet at bedtime as needed for sleep   B COMPLEX VITAMINS TABLET    Take 1 tablet by mouth daily.   CHOLECALCIFEROL (VITAMIN D PO)    Take by mouth daily.   HYDROCODONE-ACETAMINOPHEN (VICODIN) 5-500 MG PER TABLET    Take 1 tablet by mouth every 8 (eight) hours as needed for pain.   MULTIPLE VITAMIN (MULTIVITAMIN) TABLET    Take 1 tablet by mouth daily.   ROSUVASTATIN (CRESTOR) 10 MG TABLET    Take 10 mg by mouth daily.  Modified Medications   Modified  Medication Previous Medication   ALENDRONATE (FOSAMAX) 70 MG TABLET alendronate (FOSAMAX) 70 MG tablet      Take 1 tablet (70 mg total) by mouth every 7 (seven) days. Take with a full glass of water on an empty stomach.    TAKE ONE TABLET BY MOUTH ONCE A WEEK  Discontinued Medications   No medications on file

## 2012-07-27 NOTE — Patient Instructions (Addendum)
Today we updated your med list in our EPIC system...    Continue your current medications the same...  Please return to our lab one morning this week for your FASTING blood work...    We will call you w/ the results when avail...  We anticipate starting CRESTOR 10mg  for your cholesterol...  We will also schedule a bone density test for you...   Call DrDickstein's old office at [phone] & see if you can get an appt w/ DrFogelman or [facility] for a GYN check up...  Call for any questions.Marland KitchenMarland Kitchen

## 2012-07-28 ENCOUNTER — Other Ambulatory Visit (INDEPENDENT_AMBULATORY_CARE_PROVIDER_SITE_OTHER): Payer: Medicare Other

## 2012-07-28 DIAGNOSIS — E7800 Pure hypercholesterolemia, unspecified: Secondary | ICD-10-CM

## 2012-07-28 DIAGNOSIS — M545 Low back pain, unspecified: Secondary | ICD-10-CM

## 2012-07-28 DIAGNOSIS — E042 Nontoxic multinodular goiter: Secondary | ICD-10-CM

## 2012-07-28 DIAGNOSIS — F341 Dysthymic disorder: Secondary | ICD-10-CM

## 2012-07-28 LAB — CBC WITH DIFFERENTIAL/PLATELET
Basophils Relative: 0.6 % (ref 0.0–3.0)
Eosinophils Absolute: 0.2 10*3/uL (ref 0.0–0.7)
MCHC: 33.7 g/dL (ref 30.0–36.0)
MCV: 83.7 fl (ref 78.0–100.0)
Monocytes Absolute: 0.5 10*3/uL (ref 0.1–1.0)
Neutrophils Relative %: 46.2 % (ref 43.0–77.0)
RBC: 5.13 Mil/uL — ABNORMAL HIGH (ref 3.87–5.11)

## 2012-07-28 LAB — HEPATIC FUNCTION PANEL
ALT: 30 U/L (ref 0–35)
AST: 30 U/L (ref 0–37)
Bilirubin, Direct: 0.1 mg/dL (ref 0.0–0.3)
Total Bilirubin: 1.2 mg/dL (ref 0.3–1.2)

## 2012-07-28 LAB — BASIC METABOLIC PANEL
Chloride: 106 mEq/L (ref 96–112)
Potassium: 3.9 mEq/L (ref 3.5–5.1)

## 2012-07-28 LAB — LIPID PANEL
Total CHOL/HDL Ratio: 6
VLDL: 32.4 mg/dL (ref 0.0–40.0)

## 2012-07-28 LAB — T3, FREE: T3, Free: 2.6 pg/mL (ref 2.3–4.2)

## 2012-08-03 ENCOUNTER — Other Ambulatory Visit: Payer: Self-pay | Admitting: Pulmonary Disease

## 2012-08-03 DIAGNOSIS — E042 Nontoxic multinodular goiter: Secondary | ICD-10-CM

## 2012-08-05 ENCOUNTER — Ambulatory Visit (HOSPITAL_COMMUNITY)
Admission: RE | Admit: 2012-08-05 | Discharge: 2012-08-05 | Disposition: A | Discharge status: Home / Self Care | Payer: Medicare Other | Source: Ambulatory Visit | Attending: Pulmonary Disease | Admitting: Pulmonary Disease

## 2012-08-05 DIAGNOSIS — E042 Nontoxic multinodular goiter: Secondary | ICD-10-CM

## 2012-08-09 ENCOUNTER — Other Ambulatory Visit: Payer: Self-pay | Admitting: Pulmonary Disease

## 2012-08-09 DIAGNOSIS — E041 Nontoxic single thyroid nodule: Secondary | ICD-10-CM

## 2012-08-09 DIAGNOSIS — E042 Nontoxic multinodular goiter: Secondary | ICD-10-CM

## 2012-08-11 ENCOUNTER — Telehealth: Payer: Self-pay | Admitting: Pulmonary Disease

## 2012-08-11 DIAGNOSIS — E042 Nontoxic multinodular goiter: Secondary | ICD-10-CM

## 2012-08-11 NOTE — Telephone Encounter (Signed)
Order for US thyroid biopsy was sent to [facility]

## 2012-08-12 ENCOUNTER — Telehealth: Payer: Self-pay | Admitting: Pulmonary Disease

## 2012-08-12 MED ORDER — ALPRAZOLAM 0.5 MG PO TABS: 0.5000 mg | ORAL_TABLET | Freq: Every evening | ORAL | Status: DC | PRN

## 2012-08-12 NOTE — Telephone Encounter (Signed)
Ok per TD to call in xanax 0.5mg  #30 X3 rx called in pt is aware

## 2012-08-15 ENCOUNTER — Other Ambulatory Visit: Payer: Self-pay | Admitting: Pulmonary Disease

## 2012-08-15 DIAGNOSIS — E042 Nontoxic multinodular goiter: Secondary | ICD-10-CM

## 2012-08-22 ENCOUNTER — Inpatient Hospital Stay: Admission: RE | Admit: 2012-08-22 | Payer: Medicare Other | Source: Ambulatory Visit

## 2012-08-24 ENCOUNTER — Other Ambulatory Visit (HOSPITAL_COMMUNITY): Payer: Self-pay | Admitting: Otolaryngology

## 2012-08-24 DIAGNOSIS — E041 Nontoxic single thyroid nodule: Secondary | ICD-10-CM

## 2012-08-25 ENCOUNTER — Other Ambulatory Visit: Payer: Self-pay | Admitting: Radiology

## 2012-08-26 ENCOUNTER — Encounter (HOSPITAL_COMMUNITY): Payer: Self-pay | Admitting: Pharmacy Technician

## 2012-08-29 ENCOUNTER — Ambulatory Visit (HOSPITAL_COMMUNITY): Admission: RE | Admit: 2012-08-29 | Payer: Medicare Other | Source: Ambulatory Visit

## 2012-08-30 ENCOUNTER — Other Ambulatory Visit: Payer: Self-pay | Admitting: Otolaryngology

## 2012-09-01 ENCOUNTER — Encounter (HOSPITAL_COMMUNITY): Payer: Self-pay | Admitting: Pharmacy Technician

## 2012-09-09 ENCOUNTER — Encounter (HOSPITAL_COMMUNITY): Payer: Self-pay

## 2012-09-09 ENCOUNTER — Encounter (HOSPITAL_COMMUNITY)
Admission: RE | Admit: 2012-09-09 | Discharge: 2012-09-09 | Disposition: A | Discharge status: Home / Self Care | Payer: Medicare Other | Source: Ambulatory Visit | Attending: Otolaryngology | Admitting: Otolaryngology

## 2012-09-09 HISTORY — DX: Anxiety disorder, unspecified: F41.9

## 2012-09-09 HISTORY — DX: Family history of other specified conditions: Z84.89

## 2012-09-09 HISTORY — DX: Inflammatory liver disease, unspecified: K75.9

## 2012-09-09 LAB — CBC
HCT: 42.1 % (ref 36.0–46.0)
MCH: 29.3 pg (ref 26.0–34.0)
MCHC: 34.7 g/dL (ref 30.0–36.0)
MCV: 84.4 fL (ref 78.0–100.0)
Platelets: 198 10*3/uL (ref 150–400)
RDW: 13.3 % (ref 11.5–15.5)

## 2012-09-09 LAB — COMPREHENSIVE METABOLIC PANEL
ALT: 34 U/L (ref 0–35)
Albumin: 4 g/dL (ref 3.5–5.2)
Alkaline Phosphatase: 82 U/L (ref 39–117)
Chloride: 104 mEq/L (ref 96–112)
Potassium: 4 mEq/L (ref 3.5–5.1)
Sodium: 141 mEq/L (ref 135–145)
Total Bilirubin: 0.7 mg/dL (ref 0.3–1.2)
Total Protein: 7 g/dL (ref 6.0–8.3)

## 2012-09-09 NOTE — Progress Notes (Signed)
Pt here for PAT.  Denies sleep study, Echo, Stress and/or heart catherization.  Pt has a hx of hepatitis-CMET obtained.

## 2012-09-09 NOTE — Pre-Procedure Instructions (Signed)
[name] Johnnette Litter  09/09/2012   Your procedure is scheduled on:  Thursday, 09/15/2012  Report to [facility] at 6:15 AM.  Call this number if you have problems the morning of surgery: [phone]   Remember:   Do not eat food or drink liquids after midnight.   Take these medicines the morning of surgery with A SIP OF WATER: Clonazepam( if needed)   Do not wear jewelry, make-up or nail polish.  Do not wear lotions, powders, or perfumes. You may wear deodorant.  Do not shave 48 hours prior to surgery. Men may shave face and neck.  Do not bring valuables to the hospital.  Contacts, dentures or bridgework may not be worn into surgery.  Leave suitcase in the car. After surgery it may be brought to your room.  For patients admitted to the hospital, checkout time is 11:00 AM the day of discharge.   Patients discharged the day of surgery will not be allowed to drive home.  Name and phone number of your driver: Henri Medal  Special Instructions: Shower using CHG 2 nights before surgery and the night before surgery.  If you shower the day of surgery use CHG.  Use special wash - you have one bottle of CHG for all showers.  You should use approximately 1/3 of the bottle for each shower.   Please read over the following fact sheets that you were given: Pain Booklet, Coughing and Deep Breathing and Surgical Site Infection Prevention

## 2012-09-09 NOTE — Progress Notes (Signed)
Orders under sign/held.  Dr. Jearld Fenton office notified(spoke w/ Gearldine Bienenstock).

## 2012-09-15 ENCOUNTER — Ambulatory Visit (HOSPITAL_COMMUNITY): Payer: Medicare Other | Admitting: Certified Registered"

## 2012-09-15 ENCOUNTER — Encounter (HOSPITAL_COMMUNITY): Payer: Self-pay | Admitting: Certified Registered"

## 2012-09-15 ENCOUNTER — Encounter (HOSPITAL_COMMUNITY): Payer: Self-pay | Admitting: Surgery

## 2012-09-15 ENCOUNTER — Observation Stay (HOSPITAL_COMMUNITY)
Admission: RE | Admit: 2012-09-15 | Discharge: 2012-09-16 | Disposition: A | Discharge status: Home / Self Care | Payer: Medicare Other | Source: Ambulatory Visit | Attending: Otolaryngology | Admitting: Otolaryngology

## 2012-09-15 ENCOUNTER — Encounter (HOSPITAL_COMMUNITY): Payer: Self-pay | Admitting: General Practice

## 2012-09-15 ENCOUNTER — Encounter (HOSPITAL_COMMUNITY)
Admission: RE | Disposition: A | Discharge status: Home / Self Care | Payer: Self-pay | Source: Ambulatory Visit | Attending: Otolaryngology

## 2012-09-15 ENCOUNTER — Ambulatory Visit (HOSPITAL_COMMUNITY): Payer: Medicare Other

## 2012-09-15 DIAGNOSIS — E042 Nontoxic multinodular goiter: Principal | ICD-10-CM | POA: Insufficient documentation

## 2012-09-15 DIAGNOSIS — Z01818 Encounter for other preprocedural examination: Secondary | ICD-10-CM | POA: Insufficient documentation

## 2012-09-15 DIAGNOSIS — Z01812 Encounter for preprocedural laboratory examination: Secondary | ICD-10-CM | POA: Insufficient documentation

## 2012-09-15 HISTORY — PX: THYROID LOBECTOMY: SHX420

## 2012-09-15 HISTORY — PX: THYROIDECTOMY, PARTIAL: SHX18

## 2012-09-15 SURGERY — LOBECTOMY, THYROID
Anesthesia: General | Site: Neck | Laterality: Left | Wound class: Clean

## 2012-09-15 MED ORDER — ASPIRIN 325 MG PO TABS
325.0000 mg | ORAL_TABLET | Freq: Every day | ORAL | Status: DC
  Administered 2012-09-15 – 2012-09-16 (×2): 325 mg via ORAL
  Filled 2012-09-15 (×2): qty 1

## 2012-09-15 MED ORDER — DEXTROSE-NACL 5-0.45 % IV SOLN
INTRAVENOUS | Status: DC
  Administered 2012-09-15 – 2012-09-16 (×3): via INTRAVENOUS
  Filled 2012-09-15 (×4): qty 1000

## 2012-09-15 MED ORDER — METOCLOPRAMIDE HCL 5 MG/ML IJ SOLN
INTRAMUSCULAR | Status: DC | PRN
  Administered 2012-09-15: 10 mg via INTRAVENOUS

## 2012-09-15 MED ORDER — ALENDRONATE SODIUM 70 MG PO TABS: 70.0000 mg | ORAL_TABLET | ORAL | Status: DC

## 2012-09-15 MED ORDER — METOCLOPRAMIDE HCL 5 MG/ML IJ SOLN: 10.0000 mg | Freq: Once | INTRAMUSCULAR | Status: DC | PRN

## 2012-09-15 MED ORDER — ONDANSETRON HCL 4 MG/2ML IJ SOLN: 4.0000 mg | INTRAMUSCULAR | Status: DC | PRN

## 2012-09-15 MED ORDER — EPHEDRINE SULFATE 50 MG/ML IJ SOLN
INTRAMUSCULAR | Status: DC | PRN
  Administered 2012-09-15: 10 mg via INTRAVENOUS

## 2012-09-15 MED ORDER — ESCITALOPRAM OXALATE 10 MG PO TABS
10.0000 mg | ORAL_TABLET | Freq: Every day | ORAL | Status: DC
  Administered 2012-09-15 – 2012-09-16 (×2): 10 mg via ORAL
  Filled 2012-09-15 (×2): qty 1

## 2012-09-15 MED ORDER — OXYCODONE HCL 5 MG PO TABS: 5.0000 mg | ORAL_TABLET | Freq: Once | ORAL | Status: DC | PRN

## 2012-09-15 MED ORDER — MIDAZOLAM HCL 5 MG/5ML IJ SOLN
INTRAMUSCULAR | Status: DC | PRN
  Administered 2012-09-15: 2 mg via INTRAVENOUS

## 2012-09-15 MED ORDER — SODIUM CHLORIDE 0.9 % IR SOLN
Status: DC | PRN
  Administered 2012-09-15: 1

## 2012-09-15 MED ORDER — SCOPOLAMINE 1 MG/3DAYS TD PT72
MEDICATED_PATCH | TRANSDERMAL | Status: DC | PRN
  Administered 2012-09-15: 1 via TRANSDERMAL

## 2012-09-15 MED ORDER — VITAMIN D3 25 MCG (1000 UNIT) PO TABS
1000.0000 [IU] | ORAL_TABLET | Freq: Every day | ORAL | Status: DC
  Administered 2012-09-15 – 2012-09-16 (×2): 1000 [IU] via ORAL
  Filled 2012-09-15 (×2): qty 1

## 2012-09-15 MED ORDER — HYDROMORPHONE HCL PF 1 MG/ML IJ SOLN
INTRAMUSCULAR | Status: AC
  Filled 2012-09-15: qty 1

## 2012-09-15 MED ORDER — SUCCINYLCHOLINE CHLORIDE 20 MG/ML IJ SOLN
INTRAMUSCULAR | Status: DC | PRN
  Administered 2012-09-15: 100 mg via INTRAVENOUS

## 2012-09-15 MED ORDER — DEXAMETHASONE SODIUM PHOSPHATE 4 MG/ML IJ SOLN
INTRAMUSCULAR | Status: DC | PRN
  Administered 2012-09-15: 8 mg via INTRAVENOUS

## 2012-09-15 MED ORDER — FENTANYL CITRATE 0.05 MG/ML IJ SOLN
INTRAMUSCULAR | Status: DC | PRN
  Administered 2012-09-15: 100 ug via INTRAVENOUS
  Administered 2012-09-15: 50 ug via INTRAVENOUS

## 2012-09-15 MED ORDER — OXYCODONE HCL 5 MG/5ML PO SOLN: 5.0000 mg | Freq: Once | ORAL | Status: DC | PRN

## 2012-09-15 MED ORDER — LACTATED RINGERS IV SOLN
INTRAVENOUS | Status: DC | PRN
  Administered 2012-09-15 (×2): via INTRAVENOUS

## 2012-09-15 MED ORDER — ONDANSETRON HCL 4 MG PO TABS: 4.0000 mg | ORAL_TABLET | ORAL | Status: DC | PRN

## 2012-09-15 MED ORDER — MORPHINE SULFATE 2 MG/ML IJ SOLN
2.0000 mg | INTRAMUSCULAR | Status: DC | PRN
  Administered 2012-09-15 – 2012-09-16 (×3): 2 mg via INTRAVENOUS
  Filled 2012-09-15 (×4): qty 1

## 2012-09-15 MED ORDER — PROPOFOL 10 MG/ML IV BOLUS
INTRAVENOUS | Status: DC | PRN
  Administered 2012-09-15: 40 mg via INTRAVENOUS
  Administered 2012-09-15: 50 mg via INTRAVENOUS
  Administered 2012-09-15: 160 mg via INTRAVENOUS

## 2012-09-15 MED ORDER — HYDROCODONE-ACETAMINOPHEN 7.5-325 MG/15ML PO SOLN: 10.0000 mL | ORAL | Status: DC | PRN

## 2012-09-15 MED ORDER — ONDANSETRON HCL 4 MG/2ML IJ SOLN
INTRAMUSCULAR | Status: DC | PRN
  Administered 2012-09-15: 4 mg via INTRAVENOUS

## 2012-09-15 MED ORDER — HYDROMORPHONE HCL PF 1 MG/ML IJ SOLN
0.2500 mg | INTRAMUSCULAR | Status: DC | PRN
  Administered 2012-09-15 (×4): 0.5 mg via INTRAVENOUS

## 2012-09-15 MED ORDER — PHENYLEPHRINE HCL 10 MG/ML IJ SOLN
INTRAMUSCULAR | Status: DC | PRN
  Administered 2012-09-15 (×2): 80 ug via INTRAVENOUS

## 2012-09-15 SURGICAL SUPPLY — 54 items
APPLIER CLIP 9.375 SM OPEN (CLIP) ×6
ATTRACTOMAT 16X20 MAGNETIC DRP (DRAPES) ×3 IMPLANT
BLADE SURG 15 STRL LF DISP TIS (BLADE) IMPLANT
BLADE SURG 15 STRL SS (BLADE)
BLADE SURG ROTATE 9660 (MISCELLANEOUS) IMPLANT
CANISTER SUCTION 2500CC (MISCELLANEOUS) ×3 IMPLANT
CLEANER TIP ELECTROSURG 2X2 (MISCELLANEOUS) ×3 IMPLANT
CLIP APPLIE 9.375 SM OPEN (CLIP) ×4 IMPLANT
CLOTH BEACON ORANGE TIMEOUT ST (SAFETY) ×3 IMPLANT
CONT SPEC 4OZ CLIKSEAL STRL BL (MISCELLANEOUS) ×3 IMPLANT
CORDS BIPOLAR (ELECTRODE) ×3 IMPLANT
COVER SURGICAL LIGHT HANDLE (MISCELLANEOUS) ×3 IMPLANT
CRADLE DONUT ADULT HEAD (MISCELLANEOUS) ×3 IMPLANT
DERMABOND ADVANCED (GAUZE/BANDAGES/DRESSINGS) ×1
DERMABOND ADVANCED .7 DNX12 (GAUZE/BANDAGES/DRESSINGS) ×2 IMPLANT
DRAIN JACKSON RD 7FR 3/32 (WOUND CARE) ×3 IMPLANT
ELECT COATED BLADE 2.86 ST (ELECTRODE) ×3 IMPLANT
ELECT REM PT RETURN 9FT ADLT (ELECTROSURGICAL) ×3
ELECTRODE REM PT RTRN 9FT ADLT (ELECTROSURGICAL) ×2 IMPLANT
EVACUATOR SILICONE 100CC (DRAIN) ×3 IMPLANT
GAUZE SPONGE 4X4 16PLY XRAY LF (GAUZE/BANDAGES/DRESSINGS) IMPLANT
GLOVE BIO SURGEON STRL SZ7.5 (GLOVE) ×3 IMPLANT
GLOVE ECLIPSE 6.5 STRL STRAW (GLOVE) ×6 IMPLANT
GLOVE ECLIPSE 7.5 STRL STRAW (GLOVE) ×3 IMPLANT
GLOVE SURG SS PI 6.5 STRL IVOR (GLOVE) ×3 IMPLANT
GOWN PREVENTION PLUS XLARGE (GOWN DISPOSABLE) ×3 IMPLANT
GOWN STRL NON-REIN LRG LVL3 (GOWN DISPOSABLE) ×9 IMPLANT
KIT BASIN OR (CUSTOM PROCEDURE TRAY) ×3 IMPLANT
KIT ROOM TURNOVER OR (KITS) ×3 IMPLANT
LOCATOR NERVE 3 VOLT (DISPOSABLE) IMPLANT
NS IRRIG 1000ML POUR BTL (IV SOLUTION) ×3 IMPLANT
PAD ARMBOARD 7.5X6 YLW CONV (MISCELLANEOUS) ×6 IMPLANT
PENCIL FOOT CONTROL (ELECTRODE) ×3 IMPLANT
PROBE NERVBE PRASS .33 (MISCELLANEOUS) ×3 IMPLANT
SOLUTION BETADINE 4OZ (MISCELLANEOUS) ×3 IMPLANT
SPONGE INTESTINAL PEANUT (DISPOSABLE) IMPLANT
STAPLER VISISTAT 35W (STAPLE) ×3 IMPLANT
SUT CHROMIC 4 0 PS 2 18 (SUTURE) ×6 IMPLANT
SUT ETHILON 3 0 PS 1 (SUTURE) ×3 IMPLANT
SUT SILK 2 0 (SUTURE) ×1
SUT SILK 2-0 18XBRD TIE 12 (SUTURE) ×2 IMPLANT
SUT SILK 4 0 (SUTURE) ×1
SUT SILK 4-0 18XBRD TIE 12 (SUTURE) ×2 IMPLANT
TAPE HY-TAPE 1X5Y PINK NS LF (GAUZE/BANDAGES/DRESSINGS) ×3 IMPLANT
TOWEL OR 17X24 6PK STRL BLUE (TOWEL DISPOSABLE) ×3 IMPLANT
TOWEL OR 17X26 10 PK STRL BLUE (TOWEL DISPOSABLE) ×3 IMPLANT
TOWEL OR NON WOVEN STRL DISP B (DISPOSABLE) ×3 IMPLANT
TRAY ENT MC OR (CUSTOM PROCEDURE TRAY) ×3 IMPLANT
TRAY FOLEY CATH 14FRSI W/METER (CATHETERS) IMPLANT
TUBE ENDOTRAC EMG 7X10.2 (MISCELLANEOUS) ×3 IMPLANT
TUBE ENDOTRAC EMG 8X11.3 (MISCELLANEOUS) IMPLANT
TUBE ENDOTRACH  EMG 6MMTUBE EN (MISCELLANEOUS)
TUBE ENDOTRACH EMG 6MMTUBE EN (MISCELLANEOUS) IMPLANT
WATER STERILE IRR 1000ML POUR (IV SOLUTION) ×3 IMPLANT

## 2012-09-15 NOTE — Transfer of Care (Signed)
Immediate Anesthesia Transfer of Care Note  Patient: Carly Rhodes  Procedure(s) Performed: Procedure(s) (LRB) with comments: THYROID LOBECTOMY (Left)  Patient Location: PACU  Anesthesia Type:General  Level of Consciousness: awake  Airway & Oxygen Therapy: Patient Spontanous Breathing and Patient connected to nasal cannula oxygen  Post-op Assessment: Report given to PACU RN, Post -op Vital signs reviewed and stable and Patient moving all extremities  Post vital signs: Reviewed and stable  Complications: No apparent anesthesia complications

## 2012-09-15 NOTE — Anesthesia Preprocedure Evaluation (Addendum)
Anesthesia Evaluation  Patient identified by MRN, date of birth, ID band Patient awake    Reviewed: Allergy & Precautions, H&P , NPO status , Patient's Chart, lab work & pertinent test results, reviewed documented beta blocker date and time   History of Anesthesia Complications (+) PONV and Family history of anesthesia reaction  Airway Mallampati: II TM Distance: >3 FB Neck ROM: full    Dental  (+) Dental Advisory Given and Teeth Intact   Pulmonary neg pulmonary ROS,  breath sounds clear to auscultation        Cardiovascular negative cardio ROS  Rhythm:regular     Neuro/Psych PSYCHIATRIC DISORDERS negative neurological ROS     GI/Hepatic negative GI ROS, (+) Hepatitis -, Unspecified  Endo/Other  negative endocrine ROS  Renal/GU negative Renal ROS  negative genitourinary   Musculoskeletal   Abdominal   Peds  Hematology negative hematology ROS (+)   Anesthesia Other Findings See surgeon's H&P   Reproductive/Obstetrics negative OB ROS                          Anesthesia Physical Anesthesia Plan  ASA: II  Anesthesia Plan: General   Post-op Pain Management:    Induction: Intravenous  Airway Management Planned: Oral ETT  Additional Equipment:   Intra-op Plan:   Post-operative Plan: Extubation in OR  Informed Consent: I have reviewed the patients History and Physical, chart, labs and discussed the procedure including the risks, benefits and alternatives for the proposed anesthesia with the patient or authorized representative who has indicated his/her understanding and acceptance.   Dental Advisory Given and Dental advisory given  Plan Discussed with: CRNA, Surgeon and Anesthesiologist  Anesthesia Plan Comments:        Anesthesia Quick Evaluation

## 2012-09-15 NOTE — Preoperative (Signed)
Beta Blockers   Reason not to administer Beta Blockers:Not Applicable 

## 2012-09-15 NOTE — OR Nursing (Signed)
Pathology called to verify that left thyroid lobe specimen was sent via tube system for frozen section at 9:27. Pathology called at 9:32 to verify that the specimen was received. Pathology called with results at 9:46.  Oralia Manis, RN

## 2012-09-15 NOTE — Progress Notes (Signed)
PHARMACIST - PHYSICIAN COMMUNICATION  CONCERNING: P&T Medication Policy Regarding Oral Bisphosphonates  RECOMMENDATION: Your order for alendronate (Fosamax), ibandronate (Boniva), or risedronate (Actonel) has been discontinued at this time.  If the patient's post-hospital medical condition warrants safe use of this class of drugs, please resume the pre-hospital regimen upon discharge.  DESCRIPTION:  Alendronate (Fosamax), ibandronate (Boniva), and risedronate (Actonel) can cause severe esophageal erosions in patients who are unable to remain upright at least 30 minutes after taking this medication.   Since brief interruptions in therapy are thought to have minimal impact on bone mineral density, the Pharmacy & Therapeutics Committee has established that bisphosphonate orders should be routinely discontinued during hospitalization.   To override this safety policy and permit administration of Boniva, Fosamax, or Actonel in the hospital, prescribers must write "DO NOT HOLD" in the comments section when placing the order for this class of medications.   Noah Delaine, RPh Clinical Pharmacist Pager: [phone] 09/15/2012 11:37 AM

## 2012-09-15 NOTE — Anesthesia Postprocedure Evaluation (Signed)
Anesthesia Post Note  Patient: Carly Rhodes  Procedure(s) Performed: Procedure(s) (LRB): THYROID LOBECTOMY (Left)  Anesthesia type: general  Patient location: PACU  Post pain: Pain level controlled  Post assessment: Patient's Cardiovascular Status Stable  Last Vitals:  Filed Vitals:   09/15/12 1047  BP:   Pulse: 91  Temp:   Resp: 12    Post vital signs: Reviewed and stable  Level of consciousness: sedated  Complications: No apparent anesthesia complications

## 2012-09-15 NOTE — Progress Notes (Signed)
Day of Surgery  Subjective: Doing well.  Had pain soon after surgery but well controlled now.  Objective: Vital signs in last 24 hours: Temp:  [97.3 F (36.3 C)-97.9 F (36.6 C)] 97.7 F (36.5 C) (01/30 1341) Pulse Rate:  [67-100] 97  (01/30 1341) Resp:  [12-18] 18  (01/30 1341) BP: (117-157)/(72-101) 117/72 mmHg (01/30 1341) SpO2:  [94 %-98 %] 95 % (01/30 1341) Weight:  [82.192 kg (181 lb 3.2 oz)] 82.192 kg (181 lb 3.2 oz) (01/30 1123) Last BM Date: 09/14/12  Intake/Output from previous day:   Intake/Output this shift: Total I/O In: 1500 [P.O.:300; I.V.:1200] Out: 75 [Drains:25; Blood:50]  General appearance: alert, cooperative and no distress Neck: thyroidectomy incision clean and intact, drain functioning, no fluid collection.  Voice normal.  Lab Results:  No results found for this basename: WBC:2,HGB:2,HCT:2,PLT:2 in the last 72 hours BMET No results found for this basename: NA:2,K:2,CL:2,CO2:2,GLUCOSE:2,BUN:2,CREATININE:2,CALCIUM:2 in the last 72 hours PT/INR No results found for this basename: LABPROT:2,INR:2 in the last 72 hours ABG No results found for this basename: PHART:2,PCO2:2,PO2:2,HCO3:2 in the last 72 hours  Studies/Results: Dg Chest 2 View  09/15/2012  *RADIOLOGY REPORT*  Clinical Data: Preop thyroidectomy.  CHEST - 2 VIEW  Comparison: 10/31/2010  Findings: Lungs are adequately inflated and otherwise clear. Cardiomediastinal silhouette and remainder of the exam is unchanged.  IMPRESSION: No acute cardiopulmonary disease.   Original Report Authenticated By: Elberta Fortis, M.D.     Anti-infectives: Anti-infectives    None      Assessment/Plan: s/p Procedure(s) (LRB) with comments: THYROID LOBECTOMY (Left) Doing well.  Drain overnight.  LOS: 0 days    Carly Rhodes 09/15/2012

## 2012-09-15 NOTE — Op Note (Signed)
Preop/postop diagnoses: Left thyroid mass Procedure: Left thyroidectomy Anesthesia: Gen. Estimated blood loss: Less than 25 cc Assistant: Dr. Christia Reading Indications: 67 year old who's had a mass in her thyroid with previous needle biopsy. It was benign. She had a followup ultrasound that showed a new mass in the left side. After long discussion she elected not to proceed with a fine-needle aspiration but rather wanted this larger nodule out. She understands that there are other nodules that potentially could have issues but for now the plan would be to remove the large mass check it for any concern for malignancy and if it is okay to leave the right side intact. We discussed the procedure. We discussed risks, benefits, and options and all questions are answered and consent was obtained. Operation: Patient was taken to the operating room placed in the supine position after general endotracheal tube anesthesia with the Nims monitor tube was prepped and draped in the usual sterile manner. The incision was made in a skin crease with electrocautery and dissected down to the subcutaneous raising a subplatysmal flap inferiorly and superiorly. Self-retaining retractor was positioned. The midline of the strap muscles was identified and opened. There was a large anterior jugular vein present over this area and was clamped and divided. The thyroid gland was immediately encountered after dividing the strap muscles and dissected was carried to the left side where careful on the capsule dissection was performed. The dissection was carried around the gland where the parathyroid on the superior aspect was identified area and it was dissected off the gland and preserved. As the gland was dissected along its capsule the  recurrent nerve was identified. The Nims monitor stimulator was used to make sure it was the nerve and it stimulated well. The inferior and superior thyroid vessels were clamped and divided as the dissection  proceeded and micro-clips were used for this. The gland was rotated more medially and anteriorly and the Berry's ligament tissue was taken off the trachea making sure the recurrent nerve was preserved. The gland was then divided at the isthmus. Sent for frozen section which was benign. Wound was irrigated with saline and a #7 JP drain inserted. Strap muscles closed with interrupted 4-0 chromic and the subcutaneous closed with the same. The drain was secured with a 3-0 nylon. The skin was closed with Dermabond. The patient was awake and brought to recovery in stable condition counts correct

## 2012-09-15 NOTE — H&P (Signed)
[name] P Carly Rhodes is an 67 y.o. female.   Chief Complaint: thyroid mass  HPI: hx of thyroid mass and now with new mass. She wants it out without FNA w/u.  Past Medical History  Diagnosis Date  . Depression   . Seasonal allergies   . Osteoporosis   . Anxiety   . Family history of anesthesia complication     sister extreme nausea & vomiting  . 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 Abdominal hysterectomy 1984  . Hammer toe surgery 1993    2nd toe left foot  . Tonsillectomy 1957  . Appendectomy 1984    at time of Hysterectomy    Family History  Problem Relation Age of Onset  . Diabetes Father   . Hypertension Sister   . Heart attack Neg Hx   . Stomach cancer Brother 38  . Colon cancer Paternal Grandmother 51   Social History:  reports that she has never smoked. She has never used smokeless tobacco. She reports that she does not drink alcohol or use illicit drugs.  Allergies:  Allergies  Allergen Reactions  . Procaine Other (See Comments)    REACTION:  Lowers blood pressure, "makes me loopy, can't move or talk"    Medications Prior to Admission  Medication Sig Dispense Refill  . alendronate (FOSAMAX) 70 MG tablet Take 70 mg by mouth every 7 (seven) days. Takes on Sundays.  Take with a full glass of water on an empty stomach.      Marland Kitchen aspirin 325 MG tablet Take 325 mg by mouth daily.      Marland Kitchen b complex vitamins tablet Take 1 tablet by mouth daily.      . cholecalciferol (VITAMIN D) 1000 UNITS tablet Take 1,000 Units by mouth daily.      . clonazePAM (KLONOPIN) 0.5 MG tablet Take 0.5 mg by mouth 2 (two) times daily as needed. For anxiety      . escitalopram (LEXAPRO) 10 MG tablet Take 10 mg by mouth daily.      . Multiple Vitamin (MULTIVITAMIN) tablet Take 1 tablet by mouth daily.      . Multiple Vitamins-Minerals (ALIVE WOMENS ENERGY) TABS Take 1 tablet by mouth daily.        No results found for this or any previous visit (from  the past 48 hour(s)). No results found.  Review of Systems  Constitutional: Negative.   HENT: Negative.   Eyes: Negative.   Respiratory: Negative.   Cardiovascular: Negative.   Skin: Negative.     Blood pressure 134/80, pulse 67, temperature 97.3 F (36.3 C), temperature source Oral, resp. rate 18, SpO2 98.00%. Physical Exam  Constitutional: She appears well-developed and well-nourished.  HENT:  Nose: Nose normal.  Mouth/Throat: Oropharynx is clear and moist.  Eyes: Pupils are equal, round, and reactive to light.  Neck: Normal range of motion. Neck supple.  Cardiovascular: Normal rate.   Respiratory: Effort normal.     Assessment/Plan Thyroid mass-  surgery discussed and she is ready to proceed  Suzanna Obey 09/15/2012, 7:54 AM

## 2012-09-16 ENCOUNTER — Encounter (HOSPITAL_COMMUNITY): Payer: Self-pay | Admitting: Otolaryngology

## 2012-09-16 MED ORDER — HYDROCODONE-ACETAMINOPHEN 5-325 MG PO TABS: 1.0000 | ORAL_TABLET | ORAL | Status: DC | PRN

## 2012-09-16 MED ORDER — HYDROCODONE-ACETAMINOPHEN 5-325 MG PO TABS
1.0000 | ORAL_TABLET | ORAL | Status: DC | PRN
  Administered 2012-09-16: 1.5 via ORAL
  Filled 2012-09-16: qty 2

## 2012-09-16 NOTE — Discharge Summary (Signed)
Physician Discharge Summary  Patient ID: Carly Rhodes MRN: [id number] DOB/AGE: 68/03/1946 68 y.o.  Admit date: 09/15/2012 Discharge date: 09/16/2012  Admission Diagnoses: Left thyroid mass  Discharge Diagnoses: Same Active Problems:  * No active hospital problems. *    Discharged Condition: good  Hospital Course: Patient was admitted after a left thyroidectomy. She did well postoperatively. She had no specific complaints other than a headache which resolved with hydrocodone. She had the drain removed postop day 1 with a wound looked excellent with no evidence of infection. Her voice was normal. She was swallowing and eating well. She was up and ambulating without difficulty. No difficulty with any pain both in the wound or anywhere else in her body. She was discharged to home.  Consults: None  Significant Diagnostic Studies: none  Treatments: surgery: Left thyroidectomy  Discharge Exam: Blood pressure 100/58, pulse 66, temperature 99 F (37.2 C), temperature source Oral, resp. rate 20, height 5\' 5"  (1.651 m), weight 82.192 kg (181 lb 3.2 oz), SpO2 94.00%. Awake and alert, no complaints. Voice is normal. Neck wound is excellent with no evidence of infection. Heart is regular. Lungs are clear. Abdomen is soft. Extremities no pain or swelling.  Disposition: Final discharge disposition not confirmed  Discharge Orders    Future Orders Please Complete By Expires   Diet - low sodium heart healthy      Increase activity slowly      Discharge instructions      Comments:   Call for an appointment within one week. Call if any questions whatsoever. Do not put any antibiotic or any Vaseline based cream or lotion on the wound. Call if there's any muscle twitching or tingling or spasm.   Call MD for:  temperature >100.4      Call MD for:  persistant nausea and vomiting      Call MD for:  severe uncontrolled pain      Call MD for:  redness, tenderness, or signs of infection (pain,  swelling, redness, odor or green/yellow discharge around incision site)      Call MD for:  difficulty breathing, headache or visual disturbances      Call MD for:  hives      Call MD for:  persistant dizziness or light-headedness      Call MD for:  extreme fatigue          Medication List     As of 09/16/2012 12:33 PM    TAKE these medications         alendronate 70 MG tablet   Commonly known as: FOSAMAX   Take 70 mg by mouth every 7 (seven) days. Takes on Sundays.  Take with a full glass of water on an empty stomach.      ALIVE WOMENS ENERGY Tabs   Take 1 tablet by mouth daily.      aspirin 325 MG tablet   Take 325 mg by mouth daily.      b complex vitamins tablet   Take 1 tablet by mouth daily.      cholecalciferol 1000 UNITS tablet   Commonly known as: VITAMIN D   Take 1,000 Units by mouth daily.      clonazePAM 0.5 MG tablet   Commonly known as: KLONOPIN   Take 0.5 mg by mouth 2 (two) times daily as needed. For anxiety      escitalopram 10 MG tablet   Commonly known as: LEXAPRO   Take 10 mg by mouth daily.  HYDROcodone-acetaminophen 5-325 MG per tablet   Commonly known as: NORCO/VICODIN   Take 1-1.5 tablets by mouth every 4 (four) hours as needed.      multivitamin tablet   Take 1 tablet by mouth daily.         SignedSuzanna Obey 09/16/2012, 12:33 PM

## 2012-09-16 NOTE — Progress Notes (Signed)
1 Day Post-Op  Subjective: She is doing well. Complain about headache last night but that resolved with one or 2 Vicodin. Her voice is normal. She's taking fluids and solids well.  Objective: Vital signs in last 24 hours: Temp:  [97.3 F (36.3 C)-99 F (37.2 C)] 99 F (37.2 C) (01/31 1031) Pulse Rate:  [66-97] 66  (01/31 1031) Resp:  [17-20] 20  (01/31 1031) BP: (90-117)/(52-72) 100/58 mmHg (01/31 1031) SpO2:  [93 %-96 %] 94 % (01/31 1031) Last BM Date: 09/14/12  Intake/Output from previous day: 01/30 0701 - 01/31 0700 In: 1860 [P.O.:660; I.V.:1200] Out: 90 [Drains:40; Blood:50] Intake/Output this shift:    She is awake and alert, no twitching or muscle spasms. Her voice is excellent. Wound is without evidence of infection. The glue is intact. The JP drain was removed without difficulty. There's no evidence of infection or redness. Lungs are clear. Heart is regular. No extremity pain or swelling.  Lab Results:  No results found for this basename: WBC:2,HGB:2,HCT:2,PLT:2 in the last 72 hours BMET No results found for this basename: NA:2,K:2,CL:2,CO2:2,GLUCOSE:2,BUN:2,CREATININE:2,CALCIUM:2 in the last 72 hours PT/INR No results found for this basename: LABPROT:2,INR:2 in the last 72 hours ABG No results found for this basename: PHART:2,PCO2:2,PO2:2,HCO3:2 in the last 72 hours  Studies/Results: Dg Chest 2 View  09/15/2012  *RADIOLOGY REPORT*  Clinical Data: Preop thyroidectomy.  CHEST - 2 VIEW  Comparison: 10/31/2010  Findings: Lungs are adequately inflated and otherwise clear. Cardiomediastinal silhouette and remainder of the exam is unchanged.  IMPRESSION: No acute cardiopulmonary disease.   Original Report Authenticated By: Elberta Fortis, M.D.     Anti-infectives: Anti-infectives    None      Assessment/Plan: s/p Procedure(s) (LRB) with comments: THYROID LOBECTOMY (Left) Discharge  LOS: 1 day    Carly Rhodes 09/16/2012

## 2012-09-16 NOTE — Discharge Planning (Signed)
Patient discharged home in stable condition. Verbalizes understanding of all discharge instructions, including home medications and follow up appointments. 

## 2012-09-18 ENCOUNTER — Telehealth: Payer: Self-pay | Admitting: Internal Medicine

## 2012-09-18 NOTE — Telephone Encounter (Signed)
Pt requesting zpak but just had thyroid sugery by Dr Jearld Fenton. Has sore throat and chest congestion but no fever or sob  Will rx with zpak but cautioned concern this may be related to surgery and not enough for her usual "congestion" so should contact Dr Jearld Fenton or see Dr Kriste Basque 2/3 if not improving and to er in meantime if worse

## 2012-09-19 ENCOUNTER — Telehealth: Payer: Self-pay | Admitting: Pulmonary Disease

## 2012-09-19 NOTE — Telephone Encounter (Signed)
Pt had called on 2/2/ and spoke to mw--doc on call and he gave zpak #1 as directed and wanted pt to call dr byers so they were aware of symptoms since she just had surgery--see 2/2 phone note from mw---Spoke to patient and she said the pharmacy has no record of rx being called in--advised pt i will call pharmacy and make sure rx is there if it is not i will call in,  told pt should could pick up anytime after 12:00p--pt verbalized understanding and is fine with this also ask pt if she called dr Jearld Fenton and she said yes, they told her the symptoms had nothing to do with her surgery and to call her pcp for meds. rx was not at pharmacy so i called and gave verbally, this was called to [facility] [address]

## 2012-09-19 NOTE — Telephone Encounter (Signed)
Will close msg. Per Tammy D. This has been taken care of. See 2nd phone msg.

## 2012-09-19 NOTE — Telephone Encounter (Signed)
LMTCB x 1 for the pt  

## 2012-12-06 ENCOUNTER — Ambulatory Visit (INDEPENDENT_AMBULATORY_CARE_PROVIDER_SITE_OTHER): Payer: Medicare Other | Admitting: Sports Medicine

## 2012-12-06 VITALS — BP 133/83 | Ht 65.0 in | Wt 170.0 lb

## 2012-12-06 DIAGNOSIS — M25511 Pain in right shoulder: Secondary | ICD-10-CM

## 2012-12-06 DIAGNOSIS — M25519 Pain in unspecified shoulder: Secondary | ICD-10-CM

## 2012-12-06 MED ORDER — HYDROCODONE-ACETAMINOPHEN 5-325 MG PO TABS: 1.0000 | ORAL_TABLET | ORAL | Status: DC | PRN

## 2012-12-06 NOTE — Assessment & Plan Note (Signed)
She was given a home exercise program to do with theraband  She was given 30 of Vicodin to use if pain returns but at that point we would want to recheck this  I don't think she plans to fill this unless the pain is severe  If she notices any weakness or worsening she will return to see me

## 2012-12-06 NOTE — Progress Notes (Signed)
  Subjective:    Patient ID: Carly Rhodes, female    DOB: 07/19/1946, 67 y.o.   MRN: [id number]  HPI  Pt presents to clinic for evaluation of right shoulder pain. Started 3 to 4 weeks ago Painful to sleep on shoulder or take off shirt. Does not recall any injury.  She thinks she may have done this while reaching back to pick up something  She recently had surgery for a thyroid goiter She took 3-4 days of the Vicodin and most of her shoulder pain resolved  This week she has been able to work in the yard with only mild pain    Review of Systems     Objective:   Physical Exam No acute distress  Full abduction and elevation bilat shoulders Able to do back scratch even bilat IR and ER strength good on rt Empty can negative Hawkins positive  Neer's and yergason's neg bilat No ttp over biceps tendon Slightly uncomfortable with pendulum swing on rt     Assessment & Plan:

## 2012-12-06 NOTE — Patient Instructions (Addendum)
Please do suggested shoulder exercises daily  Please follow up as needed  Thank you for seeing [place] today!

## 2012-12-30 ENCOUNTER — Telehealth: Payer: Self-pay | Admitting: Pulmonary Disease

## 2012-12-30 MED ORDER — HYDROCOD POLST-CHLORPHEN POLST 10-8 MG/5ML PO LQCR: 5.0000 mL | Freq: Two times a day (BID) | ORAL | Status: DC | PRN

## 2012-12-30 NOTE — Telephone Encounter (Signed)
Spoke with patient, patient aware Rx is being sent in to [facility] Patient aware to keep already scheduled OV w TP on 01/03/13 Nothing further needed at this time

## 2012-12-30 NOTE — Telephone Encounter (Signed)
Pt aslo wants to see dr Kriste Basque  On 5/19.Carly Rhodes

## 2012-12-30 NOTE — Telephone Encounter (Signed)
Spoke with patient, patient c/o constant cough  That began Sunday and worsened last night Patient states she was coughing all night Took Delsym but this did not help Patient requesting Rx for Tussionex to [facility] on [address] to hold her over the weekend Patient also wanted to be seen in the office, and I have scheduled her to come in Tues 5/20 w TP @ 430pm Dr. Kriste Basque please advise if Tussionex can be called in for patient, thank you  Last OV:07/27/12 Next OV: 01/03/13  Allergies  Allergen Reactions  . Procaine Other (See Comments)    REACTION:  Lowers blood pressure, "makes me loopy, can't move or talk"

## 2012-12-30 NOTE — Telephone Encounter (Signed)
Per SN--  Ok for tussionex  4oz  1 tsp every 12 hours prn cough.   Needs rov with cxr for further eval.  thanks

## 2013-01-03 ENCOUNTER — Encounter: Payer: Self-pay | Admitting: Adult Health

## 2013-01-03 ENCOUNTER — Ambulatory Visit (INDEPENDENT_AMBULATORY_CARE_PROVIDER_SITE_OTHER): Payer: Medicare Other | Admitting: Adult Health

## 2013-01-03 VITALS — BP 124/70 | HR 71 | Temp 97.7°F | Ht 65.5 in | Wt 180.8 lb

## 2013-01-03 DIAGNOSIS — F341 Dysthymic disorder: Secondary | ICD-10-CM

## 2013-01-03 MED ORDER — ACYCLOVIR 400 MG PO TABS: 400.0000 mg | ORAL_TABLET | Freq: Three times a day (TID) | ORAL | Status: DC

## 2013-01-03 MED ORDER — FLUOXETINE HCL 40 MG PO CAPS: 40.0000 mg | ORAL_CAPSULE | Freq: Every day | ORAL | Status: DC

## 2013-01-03 NOTE — Patient Instructions (Addendum)
Remain off Lexapro .  Begin Prozac 40mg daily  Stress reducers .  Follow up with Psychiatry as planned  Follow up with Dr. Nadel  As planned and As needed   Please contact office for sooner follow up if symptoms do not improve or worsen or seek emergency care    

## 2013-01-03 NOTE — Progress Notes (Signed)
Subjective:     Patient ID: Carly Rhodes, female   DOB: 10/08/1945, 67 y.o.   MRN: [id number]  HPI  67 y/o WF     ~  Mar10:  she presents today w/ "intestinal flu" stating that sh'e had 2d hx of feeling cold, N/ V/ D, aching all over, headache, now watery diarrhea w/o blood etc... denies f/c/s... states liquids "go right thru me" & "this is way past immodium" (she hasn't tried any OTC meds for her symptoms)...  she was seen by TP in Jan10 w/ URI and Rx w/ ZPak, then Augmentin- no problems after these meds... no other more recent antibiotics, no recent travel, no sick exposures...  we discussed gastroenteritis:  eval w/ lab work, check stools;  treatment w/ clear liq's, tylenol, lomotil sparingly...  ~  July 05, 2009:  she notes "last winter was bad- I had the flu 4 times"... c/o recent URI w/ ZPak given + Zyrtek, Nasacort/ Saline... c/o persistant symptoms and leaving for [place] in 3d... we discussed Rx w/ Depo80 + Medrol Dosepak and waiting on her flu shot til return...  ~  October 31, 2010:  46mo ROV & generally stable; requests refills as noted + ZPak to keep on hand; due for CXR, fasting blood work, & BMD...    Thyroid>  Prev eval as noted- multinod gland w/ bx ordered per DrDickstein & done by IR, DrYamagata was very difficult & painful according to the pt (benign non-neoplastic goiter);  she recalls prev throid eval by ENT w/ bx by ?DrByers that was easy;  serial TFTs w/ TSH in the low normal range;  TSH today = 0.23 & will need repeat w/ FreeT3 & FreeT4=> she never returned for the f/u labs...    Hx LBP>  she sees Chiropractor & DrFields for sports med...    Dysthymia>  remains on Prozac40 & Alpraz 0.5 Prn;  notes that these meds continue to help & she requests continued refills...  ~  July 27, 2012:  73mo ROV & [name] is concerned about a right subconjunctival hem- seen by optometrist in [place] & reassured; she has mult somatic complaints- worn out & tired, needs a new Gyn, needs a BMD,  etc... We reviewed the following problems for today's visit>>    Chol> on diet alone; her FLP remains markedly abn w/ today's FLP showing TChol 242, TG 162, HDL 43, LDL 169; we discussed her options 7 she has agreed to try Crestor10mg /d=> plan f/u FLP/ Liver profile in 2-3 mo...    Thyroid> Hx multinod thyroid w/ bx 2009 by IR showing benign follicular cells; last imaged 2009 w/ 2.4cm nodule on left & 1.7cm nodule on right; TSH is been borderline low previously; palp nodules L>R & she needs re-imaging; labs show TSH=0.44, FreeT3=2.6 (2.3-4.2), FreeT4=0.86 (0.60-1.60)...    Ortho- LBP, wrist> she is managed by DrFields & seen 10/13 w/ left thumb pain ever since dog bite 58mo prior; Dx w/ tenosynovitis & part tendon tear, given injection & splint; prev LBP after fall & eval in [place] by [facility] 5/12- Rx Vicodin & Aleve...    Osteop> BMD 6/09 at [facility]- TScores -2.9 in Spine, and -2.1 in left [facility]; treated w/ Alendronate 70mg /wk but pt stopped on her own "I'm on a sabbatical" and using calcium, MVI, VitD; we discussed recheck BMD here=> pending...    Anxiety> she is followed by Psychiatry (couldn't name the doctor) on one med that she couldn't name either (didn't bring bottles, didn't bring  list)... We reviewed prob list, meds, xrays and labs> see below for updates >> she had the 2013 Flu vaccine 11/13... LABS 12/13:  FLP remains deranged on diet alone w/ TChol=242 & LDL=169;  Chems- wnl;  CBC- wnl;  Thyroid- normal...    ADDENDUM>> Thyroid Sonar 12/13 showed multinod gland w/ most nodules unchanged; but NEW 3.4cm dominant left lower pole nodule & Bx is rec...   01/03/2013 Follow up  Pt returns for follow up for anxiety and depression .  She wants to restart on Prozac . Recently stopped Lexapro because felt it did not help .  She is following with Psych -feels the counseling is helping .  Has some anxious feelings and "low" at times.  No suicidal ideations. Has family stress No chest pain,  palpitations, fever, abd pain, n/v.    Current Problems:   HYPERCHOLESTEROLEMIA (ICD-272.0) - on diet, exercise & OTC meds;  she prev refused to consider prescription drug therapy for this problem... ~  FLP 3/12 showed TChol 242, TG 142, HDL 56, LDL 157... she refuses med rx. ~  FLP 12/13 on diet alone showed TChol 242, TG 162, HDL 43, LDL 169... She has agreed to try CRESTOR 10mg /d.  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 we do not have the details of the prev eval (?by ENT DrByers?)... TFT's here were normal--- looks like she had a thyroid bx by DrYamagata ordered by DrDickstein 6/09= c/w hyperplastic nodule/ non-neoplastic goiter (this bx was difficult & painful she recalls)... ~  labs 3/08 showed TSH = 0.47 ~  labs 3/09 showed TSH = 0.55 ~  Thyroid Sonar 5/09 showed diffusely abn thyroid c/w multinod gland & bilat biopsies done 6/09 revealing follicular cells/ non-neoplastic goiter... ~  labs 3/10 showed TSH= 0.43 ~  labs 3/12 showed TSH= 0.23=> she never ret for f/u labs as requested. ~  Labs 12/13 showed TSH=0.44, FreeT3=2.6 (2.3-4.2), FreeT4=0.86 (0.60-1.60)... ~  f/u Thyroid Sonar 12/13 showed multinod gland w/ most nodules unchanged; but NEW 3.4cm dominant left lower pole nodule & Bx is rec...  COLONOSCOPY >> she had a routine screening colonoscopy 2/13 by DrDBrodie- it was normal, no polyps or lesions, no divertics etc... F/u planned 86yrs.  GALLSTONES >> multiple small gallstones were noted on a CXR 9/13... Hx of HEPATITIS A >> age 57, no known residual problems...  Hx of LOW BACK PAIN SYNDROME (ICD-724.2) - prev evals from chiropractor and DrFields for sports medicine...   DYSTHYMIA (ICD-300.4) - prev taking Xanax 0.5mg  Qhs Prn and Fluoxetine 40mg /d... she states "I've had a melt-down & left my husb after 51yrs of marriage"... she is seeing Dr. Garald Braver psychologist for counselling help "he says it's grief"... she  is requesting to change to her sister's medication = EffexorXR, instead of Lexapro... ~  2009: we accommodated her requests- tried Lexapro- too $$$, Effexor- caused nightmares, then Zoloft, and now on generic Prozac 40mg /d...  ~  11/10: she remains on Prozac40mg /d & wishes to continue Rx as she feels this is continuing to help... ~  3/12:  ditto- continues on Prozac40 & Alpraz 0.5mg  Prn & requests refills... ~  12/13:  She is off the prev Xanax & Prozac- apparently she is seeing a Psychiatrist but she couldn't tell me who or what med they changed her to...  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 she went to plastic surg at [facility] w/ lipoma excision and removal of dystrophic right  great toenail by DrMarks in 2009.  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  HEALTH MAINTENANCE:   ~  GI:  she states that she had a colonoscopy in [facility] in 2007... we do not have this report....  ~  GYN:  routine GYN- saw DrDickstein in 2009... [facility] ([facility]=neg), but hasn't had a BMD- "they don't work, it was in the news"--- finally had BMD 6/09 by [facility]= TScores -2.9 in spine,  & -2.1 in left [facility] w/ rec for Rx by DrDickstein on ALENDRONATE... Vit D level 3/10= 41...  ~  IMMUNIZ:  She gets the yearly flu vaccine;      Past Surgical History  Procedure Laterality Date  . Abdominal hysterectomy  1984  . Hammer toe surgery  1993    2nd toe left foot  . Tonsillectomy  1957  . Appendectomy  1984    at time of Hysterectomy  . Thyroidectomy, partial  09/15/2012    Dr Jearld Fenton  . Thyroid lobectomy  09/15/2012    Procedure: THYROID LOBECTOMY;  Surgeon: Suzanna Obey, MD;  Location: [facility] OR;  Service: ENT;  Laterality: Left;    Outpatient Encounter Prescriptions as of 01/03/2013  Medication Sig Dispense Refill  . alendronate (FOSAMAX) 70 MG tablet Take 70  mg by mouth every 7 (seven) days. Takes on Sundays.  Take with a full glass of water on an empty stomach.      Marland Kitchen aspirin 325 MG tablet Take 325 mg by mouth daily.      Marland Kitchen b complex vitamins tablet Take 1 tablet by mouth daily.      . cholecalciferol (VITAMIN D) 1000 UNITS tablet Take 1,000 Units by mouth daily.      . clonazePAM (KLONOPIN) 0.5 MG tablet 1/2 tab by mouth twice daily for anxiety      . Multiple Vitamin (MULTIVITAMIN) tablet Take 1 tablet by mouth daily.      . Multiple Vitamins-Minerals (ALIVE WOMENS ENERGY) TABS Take 1 tablet by mouth daily.      . chlorpheniramine-HYDROcodone (TUSSIONEX) 10-8 MG/5ML LQCR Take 5 mLs by mouth every 12 (twelve) hours as needed (cough).  140 mL  0  . escitalopram (LEXAPRO) 10 MG tablet Take 10 mg by mouth daily.      Marland Kitchen HYDROcodone-acetaminophen (NORCO/VICODIN) 5-325 MG per tablet Take 1 tablet by mouth every 4 (four) hours as needed.  30 tablet  none   No facility-administered encounter medications on file as of 01/03/2013.    Allergies  Allergen Reactions  . Procaine Other (See Comments)    REACTION:  Lowers blood pressure, "makes me loopy, can't move or talk"     Current Medications, Allergies, Past Medical History, Past Surgical History, Family History, and Social History were reviewed in [employer] record.   Review of Systems  Reviewed neg except for HPI             Objective:   Physical Exam      WD, WN, 67 y/o WF in NAD... GENERAL:  Alert & oriented; pleasant & cooperative... HEENT:  [place]/AT, EOM-full, PERRLA, EACs-clear, TMs-normal, NOSE-sl pale, THROAT-clear & wnl. NECK:  Supple w/ fairROM; no JVD; normal carotid impulses w/o bruits;   no lymphadenopathy.,neck scar  CHEST:  Coarse BS w/ no wheezes, rales, or signs of consolidation. HEART:  Regular Rhythm; without murmurs/ rubs/ or gallops. ABDOMEN:  Soft & nontender; incr bowel sounds; no organomegaly or masses detected. EXT: without deformities or  arthritic changes; no varicose veins/ venous insuffic/ or edema. NEURO:  no focal neuro deficits... DERM: no skin lesions identified...    Assessment:

## 2013-01-05 NOTE — Assessment & Plan Note (Signed)
Remain off Lexapro .  Begin Prozac 40mg  daily  Stress reducers .  Follow up with Psychiatry as planned  Follow up with Dr. Kriste Basque  As planned and As needed   Please contact office for sooner follow up if symptoms do not improve or worsen or seek emergency care

## 2013-01-16 ENCOUNTER — Other Ambulatory Visit: Payer: Self-pay | Admitting: Pulmonary Disease

## 2013-02-16 ENCOUNTER — Ambulatory Visit (INDEPENDENT_AMBULATORY_CARE_PROVIDER_SITE_OTHER): Payer: Medicare Other | Admitting: Sports Medicine

## 2013-02-16 ENCOUNTER — Ambulatory Visit
Admission: RE | Admit: 2013-02-16 | Discharge: 2013-02-16 | Disposition: A | Discharge status: Home / Self Care | Payer: Medicare Other | Source: Ambulatory Visit | Attending: Sports Medicine | Admitting: Sports Medicine

## 2013-02-16 VITALS — BP 110/70 | Ht 65.0 in | Wt 170.0 lb

## 2013-02-16 DIAGNOSIS — M67919 Unspecified disorder of synovium and tendon, unspecified shoulder: Secondary | ICD-10-CM

## 2013-02-16 DIAGNOSIS — M25511 Pain in right shoulder: Secondary | ICD-10-CM

## 2013-02-16 DIAGNOSIS — M7581 Other shoulder lesions, right shoulder: Secondary | ICD-10-CM | POA: Insufficient documentation

## 2013-02-16 DIAGNOSIS — M25519 Pain in unspecified shoulder: Secondary | ICD-10-CM

## 2013-02-16 MED ORDER — HYDROCODONE-ACETAMINOPHEN 5-325 MG PO TABS: 1.0000 | ORAL_TABLET | Freq: Four times a day (QID) | ORAL | Status: DC | PRN

## 2013-02-16 MED ORDER — AMITRIPTYLINE HCL 25 MG PO TABS: 25.0000 mg | ORAL_TABLET | Freq: Every day | ORAL | Status: DC

## 2013-02-16 MED ORDER — NITROGLYCERIN 0.2 MG/HR TD PT24: MEDICATED_PATCH | TRANSDERMAL | Status: DC

## 2013-02-16 NOTE — Assessment & Plan Note (Signed)
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

## 2013-02-16 NOTE — Progress Notes (Signed)
  Subjective:    Patient ID: Carly Rhodes, female    DOB: May 20, 1946, 67 y.o.   MRN: [id number]  HPI  Pt presents to clinic for rt shoulder pain which has not improved. She originally injured shoulder a few months ago by reaching behind her to pick up something that was heavier than expected.   Pain is worse at night, has problems sleeping due to pain. Takes 2 ibuprofen at bedtime, and 1/2 Vicodin for severe pain. She has not been doing home exercises.  History of doing body pump She has had some occasional pain but no severe injuries to the shoulder  In the past 4 weeks the pain is worse because it wakes her up more often and she sleeps in her recliner to keep from rolling over on the shoulder     Review of Systems     Objective:   Physical Exam No acute distress  Rt shoulder exam: Neg empty can neer's and hawkins are painful Click with ER on clunk test This feels better with stabilization of ant capsule  Internal rotation is strong but she feels some slight pain on the anterior shoulder External rotation strong Abduction strong  Crossover test painful Back scratch and range of motion are full  Shoulder x-ray shows only mild changes of arthritis in the a.c. joint        Assessment & Plan:

## 2013-02-16 NOTE — Assessment & Plan Note (Signed)
She can use ibuprofen for mild to moderate pain but I gave her some additional Vicodin to use at nighttime if it is keeping her awake  I would also add amitriptyline 25 mg at night because I think she gets spasm periodically

## 2013-02-16 NOTE — Patient Instructions (Addendum)
You have 2 tears in your rotator cuff tendon  Please start taking amitriptyline 25 mg at bedtime  Use 1/4 nitroglycerin patch on right shoulder daily  Nitroglycerin Protocol   Apply 1/4 nitroglycerin patch to affected area daily.  Change position of patch within the affected area every 24 hours.  You may experience a headache during the first 1-2 weeks of using the patch, these should subside.  If you experience headaches after beginning nitroglycerin patch treatment, you may take your preferred over the counter pain reliever.  Another side effect of the nitroglycerin patch is skin irritation or rash related to patch adhesive.  Please notify our office if you develop more severe headaches or rash, and stop the patch.  Tendon healing with nitroglycerin patch may require 12 to 24 weeks depending on the extent of injury.  Men should not use if taking Viagra, Cialis, or Levitra.  Do not use if you have migraines or rosacea.   Please do suggested exercises daily  Ok to take 1/2 to 1 Vicodin for severe pain  Please follow up in 6 weeks  Thank you for seeing [place] today!

## 2013-02-21 ENCOUNTER — Telehealth: Payer: Self-pay | Admitting: *Deleted

## 2013-02-21 NOTE — Telephone Encounter (Addendum)
Per Dr. Darrick Penna- advised the NTG patch will be beneficial to healing- try using every other day until adjusted.  She is not getting headaches, but a discomfort.  Advised her to try taking ibuprofen or aleve as needed- headaches should go away in 1-2 weeks. Pt agreeable.

## 2013-02-21 NOTE — Telephone Encounter (Signed)
Message copied by Mora Bellman on Tue Feb 21, 2013  2:18 PM ------      Message from: Claiborne Billings      Created: Tue Feb 21, 2013  9:52 AM      Contact: [phone]       Can't take nitroglycerin patch.  Not a headache but felt like she had a "dead head".  Unless the patch is really important, she would rather not use it.  The other medicine knocks her out so she is sleeping. ------

## 2013-03-30 ENCOUNTER — Ambulatory Visit (INDEPENDENT_AMBULATORY_CARE_PROVIDER_SITE_OTHER): Payer: Medicare Other | Admitting: Sports Medicine

## 2013-03-30 ENCOUNTER — Encounter: Payer: Self-pay | Admitting: Sports Medicine

## 2013-03-30 VITALS — BP 115/78 | HR 71 | Ht 65.0 in | Wt 170.0 lb

## 2013-03-30 DIAGNOSIS — M7581 Other shoulder lesions, right shoulder: Secondary | ICD-10-CM

## 2013-03-30 DIAGNOSIS — M67919 Unspecified disorder of synovium and tendon, unspecified shoulder: Secondary | ICD-10-CM

## 2013-03-30 MED ORDER — HYDROCODONE-ACETAMINOPHEN 5-325 MG PO TABS: 1.0000 | ORAL_TABLET | Freq: Four times a day (QID) | ORAL | Status: DC | PRN

## 2013-03-30 NOTE — Assessment & Plan Note (Signed)
Increase nitroglycerin patch to 1/2 patch each day Exercises daily reviewed Continue amitryptyline Consider using ice at night before bed Rx for Hydrocodone given for prn use Avoid heavy and overhead lifting (less than 10-15 pounds) while in [place].  Avoid pulling motions Follow up in 6 weeks and we will re-image with the ultrasound

## 2013-03-30 NOTE — Progress Notes (Signed)
  [facility]  Patient name: Carly Rhodes MRN [id number]  Date of birth: 11/05/1945  CC & HPI:  Carly Rhodes is a 67 y.o. female who is returning to care.   Chief Complaint  Patient presents with  . Follow-up    rt shoulder  He returns today for followup of her right shoulder pain.  She reports some improvement in her pain in general however is continuing to have significant night pain requiring her to sleep in her recliner.  She does notice she does more during the day as she has worse pain at night.  She has been using her amitriptyline each night and taking half of a hydrocodone as needed.  She does report some grogginess with amitriptyline when first using but has significant improved.  She has been compliant with her nitroglycerin patch.  She has not been doing her exercises previously prescribed to do to not understanding what they were 4 or memory how to do them appropriately.  She's not been using ice.   She did have a significant worsening in her arm pain following trying to start a lawn mower one week ago.  She has plans to go to [place] this weekend to help her daughter move and is concerned her arms going to hurt her.      ROS:  See HPI  Pertinent History Reviewed:  Medical & Surgical Hx:  Reviewed: Significant for depression, anxiety, hepatitis Medications: Reviewed & Updated - see associated section Social History: Reviewed -  reports that she has never smoked. She has never used smokeless tobacco.   Objective Findings:  Vitals: BP 115/78  Pulse 71  Ht 5\' 5"  (1.651 m)  Wt 170 lb (77.111 kg)  BMI 28.29 kg/m2  PE: GENERAL:  elderly but spry-appearing Caucasian female. In no discomfort; no respiratory distress  PSYCH:  alert and interactive.  Poor insight, tangential and pressured speech      NeuroMSK  right shoulder Exam: Appear:  normal-appearing, nitroglycerin patch in place, no erythema   Palp:  tenderness palpation over the right a.c. joint, no  tenderness palpation of the bicipital groove   ROM:  she has full active range of motion in all planes but reports pain with shoulder abduction, flexion and internal rotation .  Significant pain with crossarm test but able to perform   NV:   bilateral upper extremities sensation intact to gross touch, upper extremity myotome strength 5+ out of 5 . Right shoulder strength 5+ out of 5 in shoulder internal rotation, external rotation, abduction, abduction.    Testing:  patient able to perform all tests but significantly limited by pain in all motions, pain but good strength with speeds test, Yergason's test, and empty can.         Assessment & Plan:   1. Tendinitis of right rotator cuff    See problem associated charting

## 2013-03-30 NOTE — Patient Instructions (Addendum)
It was nice to see you today, thanks for coming in!  Problem List Items Addressed This Visit   Tendinitis of right rotator cuff - Primary     Increase nitroglycerin patch to 1/2 patch each day Exercises daily reviewed Continue amitryptyline Consider using ice at night before bed Rx for Hydrocodone given for prn use Avoid heavy and overhead lifting (less than 10-15 pounds) while in [place].  Avoid pulling motions Follow up in 6 weeks and we will re-image with the ultrasound       Please plan to return to see Dr. Darrick Penna in 6 weeks.  If you need anything prior to your next visit please call the clinic.  Please Bring all medications or accurate medication list with you to each appointment; an accurate medication list is essential in providing you the best care possible.

## 2013-04-19 ENCOUNTER — Other Ambulatory Visit: Payer: Self-pay | Admitting: *Deleted

## 2013-04-19 MED ORDER — AMITRIPTYLINE HCL 25 MG PO TABS: 25.0000 mg | ORAL_TABLET | Freq: Every day | ORAL | Status: DC

## 2013-04-24 ENCOUNTER — Telehealth: Payer: Self-pay | Admitting: Pulmonary Disease

## 2013-04-24 NOTE — Telephone Encounter (Signed)
I spoke with pt. She stated she has been taking care of a friend that is not admitted and has mercer. She is wanting to get swabbed and tested. She stated she spoke with Dr. Delford Field in the hospital when he was rounding on pt and stated it may be a good idea if she got tested. Please advise Sn THANKS  Last OV 01/03/13 Pending 05/31/13

## 2013-04-24 NOTE — Telephone Encounter (Signed)
Come in for a nasal swab to check for MRSA.  Pt will need to have this swab done on our floor, since the lab does not swab the pt.

## 2013-04-24 NOTE — Telephone Encounter (Signed)
I spoke with pt. She stated she wants to come in tomorrow at 9 AM to have this done. Will forward to Leigh to make her aware pt is coming at that time.

## 2013-04-25 ENCOUNTER — Other Ambulatory Visit: Payer: Self-pay | Admitting: Pulmonary Disease

## 2013-04-25 ENCOUNTER — Other Ambulatory Visit: Payer: Medicare Other

## 2013-04-25 DIAGNOSIS — Z20818 Contact with and (suspected) exposure to other bacterial communicable diseases: Secondary | ICD-10-CM

## 2013-04-26 ENCOUNTER — Telehealth: Payer: Self-pay | Admitting: Pulmonary Disease

## 2013-04-26 ENCOUNTER — Other Ambulatory Visit: Payer: Medicare Other

## 2013-04-26 DIAGNOSIS — Z20818 Contact with and (suspected) exposure to other bacterial communicable diseases: Secondary | ICD-10-CM

## 2013-04-26 NOTE — Telephone Encounter (Signed)
Leigh, have you called this pt with results? I checked your result notes and did not see anything

## 2013-04-26 NOTE — Telephone Encounter (Signed)
Called and spoke with pt and she is aware that she will need to come in to our office to recollect the nasal swab. Pt stated that she will come in today after 2.

## 2013-04-28 ENCOUNTER — Telehealth: Payer: Self-pay | Admitting: Pulmonary Disease

## 2013-04-28 NOTE — Telephone Encounter (Signed)
I spoke with pt and she is returning a call to [facility] she stated it was about some test. Please advise Leigh thanks

## 2013-04-28 NOTE — Telephone Encounter (Signed)
Called and spoke with pt and she is aware of lab results per SN.  Nothing further is needed. 

## 2013-05-10 ENCOUNTER — Other Ambulatory Visit: Payer: Self-pay | Admitting: Sports Medicine

## 2013-05-16 ENCOUNTER — Telehealth: Payer: Self-pay | Admitting: Pulmonary Disease

## 2013-05-16 MED ORDER — ALPRAZOLAM 0.5 MG PO TABS: ORAL_TABLET | ORAL | Status: DC

## 2013-05-16 NOTE — Telephone Encounter (Signed)
Per SN---  Xanax 0.5 mg   #90   1/2 to 1 tablet by mouth TID prn.  thanks

## 2013-05-16 NOTE — Telephone Encounter (Signed)
Refill sent and pt is aware. Jennifer Castillo, CMA  

## 2013-05-16 NOTE — Telephone Encounter (Signed)
I spoke with pt. She stated tore her rotator cuff months ago. She is being tx by Dr. Darrick Penna for this. She stated if she has an RX for alprazolam then she does not have to take as much pain medication. Last time alprazolam was prescribed by SN was 01/16/13 #30 x 1 refill 0.5 MG strength. Please advise SN thanks Last OV 01/03/13 with TP SN 05/31/13 pending

## 2013-05-23 ENCOUNTER — Other Ambulatory Visit: Payer: Self-pay | Admitting: *Deleted

## 2013-05-23 MED ORDER — AMITRIPTYLINE HCL 25 MG PO TABS: 25.0000 mg | ORAL_TABLET | Freq: Every day | ORAL | Status: DC

## 2013-05-31 ENCOUNTER — Ambulatory Visit (INDEPENDENT_AMBULATORY_CARE_PROVIDER_SITE_OTHER): Payer: Medicare Other | Admitting: Pulmonary Disease

## 2013-05-31 ENCOUNTER — Encounter: Payer: Self-pay | Admitting: Pulmonary Disease

## 2013-05-31 VITALS — BP 106/74 | HR 67 | Temp 98.4°F | Ht 65.0 in | Wt 176.0 lb

## 2013-05-31 DIAGNOSIS — M25511 Pain in right shoulder: Secondary | ICD-10-CM

## 2013-05-31 DIAGNOSIS — Z23 Encounter for immunization: Secondary | ICD-10-CM

## 2013-05-31 DIAGNOSIS — K8020 Calculus of gallbladder without cholecystitis without obstruction: Secondary | ICD-10-CM

## 2013-05-31 DIAGNOSIS — F341 Dysthymic disorder: Secondary | ICD-10-CM

## 2013-05-31 DIAGNOSIS — M25519 Pain in unspecified shoulder: Secondary | ICD-10-CM

## 2013-05-31 DIAGNOSIS — E042 Nontoxic multinodular goiter: Secondary | ICD-10-CM

## 2013-05-31 DIAGNOSIS — M810 Age-related osteoporosis without current pathological fracture: Secondary | ICD-10-CM

## 2013-05-31 DIAGNOSIS — E7800 Pure hypercholesterolemia, unspecified: Secondary | ICD-10-CM

## 2013-05-31 MED ORDER — ACYCLOVIR 400 MG PO TABS: 400.0000 mg | ORAL_TABLET | Freq: Three times a day (TID) | ORAL | Status: DC

## 2013-05-31 MED ORDER — FLUOXETINE HCL 40 MG PO CAPS: 40.0000 mg | ORAL_CAPSULE | Freq: Every day | ORAL | Status: DC

## 2013-05-31 MED ORDER — HYDROCODONE-ACETAMINOPHEN 5-325 MG PO TABS: 1.0000 | ORAL_TABLET | Freq: Three times a day (TID) | ORAL | Status: DC | PRN

## 2013-05-31 MED ORDER — ALPRAZOLAM 0.5 MG PO TABS: ORAL_TABLET | ORAL | Status: DC

## 2013-05-31 NOTE — Patient Instructions (Signed)
Today we updated your med list in our EPIC system...    Continue your current medications the same...  We refilled the meds you requested...  We will arrange for a Bone Density Test & call you w/ the results when avail...  We gave you a PNEUMONIA vaccine (current indication is one shot after age 68)...  Call for any questions.Marland KitchenMarland Kitchen

## 2013-05-31 NOTE — Progress Notes (Signed)
Subjective:     Patient ID: Carly Rhodes, female   DOB: 01/29/1946, 67 y.o.   MRN: [id number]  HPI 67 y/o WF here for a follow up visit... he has multiple medical problems as noted below...    ~  October 31, 2010:  63mo ROV & generally stable; requests refills as noted + ZPak to keep on hand; due for CXR, fasting blood work, & BMD...    Thyroid>  Prev eval as noted- multinod gland w/ bx ordered per DrDickstein & done by IR, DrYamagata was very difficult & painful according to the pt (benign non-neoplastic goiter);  she recalls prev throid eval by ENT w/ bx by ?DrByers that was easy;  serial TFTs w/ TSH in the low normal range;  TSH today = 0.23 & will need repeat w/ FreeT3 & FreeT4=> she never returned for the f/u labs...    Hx LBP>  she sees Chiropractor & DrFields for sports med...    Dysthymia>  remains on Prozac40 & Alpraz 0.5 Prn;  notes that these meds continue to help & she requests continued refills...  ~  July 27, 2012:  43mo ROV & Carly Rhodes is concerned about a right subconjunctival hem- seen by optometrist in [place] & reassured; she has mult somatic complaints- worn out & tired, needs a new Gyn, needs a BMD, etc... We reviewed the following problems for today's visit>>    Chol> on diet alone; her FLP remains markedly abn w/ today's FLP showing TChol 242, TG 162, HDL 43, LDL 169; we discussed her options 7 she has agreed to try Crestor10mg /d=> plan f/u FLP/ Liver profile in 2-3 mo...    Thyroid> Hx multinod thyroid w/ bx 2009 by IR showing benign follicular cells; last imaged 2009 w/ 2.4cm nodule on left & 1.7cm nodule on right; TSH is been borderline low previously; palp nodules L>R & she needs re-imaging; labs show TSH=0.44, FreeT3=2.6 (2.3-4.2), FreeT4=0.86 (0.60-1.60)...    Ortho- LBP, wrist> she is managed by DrFields & seen 10/13 w/ left thumb pain ever since dog bite 27mo prior; Dx w/ tenosynovitis & part tendon tear, given injection & splint; prev LBP after fall & eval in [place] by  [facility] 5/12- Rx Vicodin & Aleve...    Osteop> BMD 6/09 at [facility]- TScores -2.9 in Spine, and -2.1 in left [facility]; treated w/ Alendronate 70mg /wk but pt stopped on her own "I'm on a sabbatical" and using calcium, MVI, VitD; we discussed recheck BMD here=> pending...    Anxiety> she is followed by Psychiatry (couldn't name the doctor) on one med that she couldn't name either (didn't bring bottles, didn't bring list)... We reviewed prob list, meds, xrays and labs> see below for updates >> she had the 2013 Flu vaccine 11/13...  LABS 12/13:  FLP remains deranged on diet alone w/ TChol=242 & LDL=169;  Chems- wnl;  CBC- wnl;  Thyroid- normal...     ADDENDUM>> Thyroid Sonar 12/13 showed multinod gland w/ most nodules unchanged; but NEW 3.4cm dominant left lower pole nodule & Bx is rec...   CXR 1/14 showed norm heart size, clear lungs, NAD...  ~  May 31, 2013:  80mo ROV & Carly Rhodes had Thyroid surg 1/14 by DrByers- left thyroidectomy for a "mass" and path revealed BENIGN MULTINODULAR THYROID WITH HYPERPLASTIC NODULES AND FOCALLY HYALINIZED AND CALCIFIED PARENCHYMA, NEGATIVE FOR ATYPIA OR MALIGNANCY; subseq TFTs have not been done yet & she wants to wait on blood work; f/u thyroid sonar per [facility] 4/14 report is NOT in  Epic...     Chol treated w/ diet alone "I take RYR"; last FLP 12/13 not at goals but refuses med rx and not fasting today for f/u blood work...    She continues to f/u w/ DrFields regularly regarding her orthopedic issues> right shoulder pain (tendonitis), sleeps in recliner, treated w/ NTG pathch/ Amitriptyline/ Hydrocodone/ Therapy...    She had f/u 5/14 w/ TP for anxiety & depression> she stopped her Lexapro (wasn't helping) & requested to go back on Prozac40; getting counselling which she feels is helpful; notes family stress & rec to continue talking/counseling... We reviewed prob list, meds, xrays and labs> see below for updates >> Given Pneumovax today... Had TDAP 39yrs ago in  hosp ER... Gets yearly Flu vaccine   BMD 10/14 showed lowest Tscore = -2.1 in Spine (improved from Gyn check in 2009); Rec to continue Calcium, MVI, VitD supplements and wt bearing exercise...    Current Problems:   HYPERCHOLESTEROLEMIA (ICD-272.0) - on diet, exercise & OTC meds;  she prev refused to consider prescription drug therapy for this problem... ~  FLP 3/12 showed TChol 242, TG 142, HDL 56, LDL 157... she refuses med rx. ~  FLP 12/13 on diet alone showed TChol 242, TG 162, HDL 43, LDL 169... She has agreed to try CRESTOR 10mg /d.  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 we do not have the details of the prev eval (?by ENT DrByers?)... TFT's here were normal--- looks like she had a thyroid bx by DrYamagata ordered by DrDickstein 6/09= c/w hyperplastic nodule/ non-neoplastic goiter (this bx was difficult & painful she recalls)... ~  labs 3/08 showed TSH = 0.47 ~  labs 3/09 showed TSH = 0.55 ~  Thyroid Sonar 5/09 showed diffusely abn thyroid c/w multinod gland & bilat biopsies done 6/09 revealing follicular cells/ non-neoplastic goiter... ~  labs 3/10 showed TSH= 0.43 ~  labs 3/12 showed TSH= 0.23=> she never ret for f/u labs as requested. ~  Labs 12/13 showed TSH=0.44, FreeT3=2.6 (2.3-4.2), FreeT4=0.86 (0.60-1.60)... ~  f/u Thyroid Sonar 12/13 showed multinod gland w/ most nodules unchanged; but NEW 3.4cm dominant left lower pole nodule & Bx is rec... ~  Thyroid surg 1/14 by DrByers- left thyroidectomy for a "mass" and path revealed BENIGN MULTINODULAR THYROID WITH HYPERPLASTIC NODULES AND FOCALLY HYALINIZED AND CALCIFIED PARENCHYMA, NEGATIVE FOR ATYPIA OR MALIGNANCY...  COLONOSCOPY >> she had a routine screening colonoscopy 2/13 by DrDBrodie- it was normal, no polyps or lesions, no divertics etc... F/u planned 37yrs.  GALLSTONES >> multiple small gallstones were noted on a CXR 9/13... Hx of HEPATITIS A >> age 31, no known  residual problems...  Hx of LOW BACK PAIN SYNDROME (ICD-724.2) - prev evals from chiropractor and DrFields for sports medicine...   OSTEOPENIA >> ~  BMD 2009 reported from GYN showed lowest Tscore = -2.9.Marland KitchenMarland Kitchen She refused Bisphos therapy... ~  BMD 10/14 showed lowest Tscore = -2.1 in Spine (improved from Gyn check in 2009); Rec to continue Calcium, MVI, VitD supplements and wt bearing exercise   DYSTHYMIA (ICD-300.4) - prev taking Xanax 0.5mg  Qhs Prn and Fluoxetine 40mg /d... she states "I've had a melt-down & left my husb after 93yrs of marriage"... she is seeing Dr. Garald Braver psychologist for counselling help "he says it's grief"... she is requesting to change to her sister's medication = EffexorXR, instead of Lexapro... ~  2009: we accommodated her requests- tried Lexapro- too $$$, Effexor- caused nightmares, then Zoloft, and now on generic Prozac  40mg /d...  ~  11/10: she remains on Prozac40mg /d & wishes to continue Rx as she feels this is continuing to help... ~  3/12:  ditto- continues on Prozac40 & Alpraz 0.5mg  Prn & requests refills... ~  12/13:  She is off the prev Xanax & Prozac- apparently she is seeing a Psychiatrist but she couldn't tell me who or what med they changed her to... 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 she went to plastic surg at [facility] w/ lipoma excision and removal of dystrophic right great toenail by DrMarks in 2009.  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  HEALTH MAINTENANCE:   ~  GI:  she states that she had a colonoscopy in [facility] in 2007... we do not have this report....  ~  GYN:  routine GYN- saw  DrDickstein in 2009... [facility]=neg), but hasn't had a BMD- "they don't work, it was in the news"--- finally had BMD 6/09 by Ma [facility]ock GYN= TScores -2.9 in spine,  & -2.1 in left [facility] w/ rec for Rx by DrDickstein on ALENDRONATE... Vit D level 3/10= 41...  ~  IMMUNIZ:  Given Pneumovax 10/14... Had TDAP 2011 in hosp ER... Gets yearly Flu vaccine   Past Surgical History  Procedure Laterality Date  . Abdominal hysterectomy  1984  . Hammer toe surgery  1993    2nd toe left foot  . Tonsillectomy  1957  . Appendectomy  1984    at time of Hysterectomy  . Thyroidectomy, partial  09/15/2012    Dr Jearld Fenton  . Thyroid lobectomy  09/15/2012    Procedure: THYROID LOBECTOMY;  Surgeon: Suzanna Obey, MD;  [facility];  Service: ENT;  Laterality: Left;    Outpatient Encounter Prescriptions as of 05/31/2013  Medication Sig Dispense Refill  . acyclovir (ZOVIRAX) 400 MG tablet Take 1 tablet (400 mg total) by mouth 3 (three) times daily.  15 tablet  5  . alendronate (FOSAMAX) 70 MG tablet Take 70 mg by mouth every 7 (seven) days. Takes on Sundays.  Take with a full glass of water on an empty stomach.      . ALPRAZolam (XANAX) 0.5 MG tablet Take 1/2 tablet to 1 tablet three times a day as needed.  90 tablet  1  . amitriptyline (ELAVIL) 25 MG tablet Take 1 tablet (25 mg total) by mouth at bedtime.  30 tablet  0  . aspirin 325 MG tablet Take 325 mg by mouth daily.      Marland Kitchen b complex vitamins tablet Take 1 tablet by mouth daily.      . cholecalciferol (VITAMIN D) 1000 UNITS tablet Take 1,000 Units by mouth daily.      Marland Kitchen FLUoxetine (PROZAC) 40 MG capsule Take 1 capsule (40 mg total) by mouth daily.  30 capsule  5  . HYDROcodone-acetaminophen (NORCO/VICODIN) 5-325 MG per tablet Take 1 tablet by mouth every 6 (six) hours as needed.  30 tablet  none  . Multiple Vitamin (MULTIVITAMIN) tablet Take 1 tablet by mouth daily.      . Multiple Vitamins-Minerals (ALIVE WOMENS  ENERGY) TABS Take 1  tablet by mouth daily.      . nitroGLYCERIN (NITRODUR - DOSED IN MG/24 HR) 0.2 mg/hr patch Apply 1 patch to affected area daily.  Change patch every 24 hours.      . [DISCONTINUED] nitroGLYCERIN (NITRODUR - DOSED IN MG/24 HR) 0.2 mg/hr Apply 1/4 patch to affected area daily.  Change patch every 24 hours.  30 patch  1  . [DISCONTINUED] chlorpheniramine-HYDROcodone (TUSSIONEX) 10-8 MG/5ML LQCR Take 5 mLs by mouth every 12 (twelve) hours as needed (cough).  140 mL  0  . [DISCONTINUED] clonazePAM (KLONOPIN) 0.5 MG tablet 1/2 tab by mouth twice daily for anxiety       No facility-administered encounter medications on file as of 05/31/2013.    Allergies  Allergen Reactions  . Procaine Other (See Comments)    REACTION:  Lowers blood pressure, "makes me loopy, can't move or talk"    Current Medications, Allergies, Past Medical History, Past Surgical History, Family History, and Social History were reviewed in [employer] record.   Review of Systems        The patient complains of gas/bloating, back pain, stiffness, arthritis, depression, and hay fever.  The patient denies fever, chills, sweats, anorexia, fatigue, weakness, malaise, weight loss, sleep disorder, blurring, diplopia, eye irritation, eye discharge, vision loss, eye pain, photophobia, earache, ear discharge, tinnitus, decreased hearing, nasal congestion, nosebleeds, sore throat, hoarseness, chest pain, palpitations, syncope, dyspnea on exertion, orthopnea, PND, peripheral edema, cough, dyspnea at rest, excessive sputum, hemoptysis, wheezing, pleurisy, nausea, vomiting, diarrhea, constipation, change in bowel habits, abdominal pain, melena, hematochezia, jaundice, indigestion/heartburn, dysphagia, odynophagia, dysuria, hematuria, urinary frequency, urinary hesitancy, nocturia, incontinence, joint pain, joint swelling, muscle cramps, muscle weakness, sciatica, restless legs, leg pain at night, leg pain  with exertion, rash, itching, dryness, suspicious lesions, paralysis, paresthesias, seizures, tremors, vertigo, transient blindness, frequent falls, frequent headaches, difficulty walking, anxiety, memory loss, confusion, cold intolerance, heat intolerance, polydipsia, polyphagia, polyuria, unusual weight change, abnormal bruising, bleeding, enlarged lymph nodes, urticaria, allergic rash, and recurrent infections.     Objective:   Physical Exam     WD, WN, 67 y/o WF in NAD... GENERAL:  Alert & oriented; pleasant & cooperative... HEENT:  [place]/AT, EOM-full, PERRLA, EACs-clear, TMs-normal, NOSE-sl pale, THROAT-clear & wnl. NECK:  Supple w/ fairROM; no JVD; normal carotid impulses w/o bruits; +thyromegaly w/ coarse bilat nodularity; no lymphadenopathy. CHEST:  Coarse BS w/ no wheezes, rales, or signs of consolidation. HEART:  Regular Rhythm; without murmurs/ rubs/ or gallops. ABDOMEN:  Soft & nontender; incr bowel sounds; no organomegaly or masses detected. EXT: without deformities or arthritic changes; no varicose veins/ venous insuffic/ or edema. NEURO:  no focal neuro deficits... DERM: no skin lesions identified...  RADIOLOGY DATA:  Reviewed in the EPIC EMR & discussed w/ the patient...  LABORATORY DATA:  Reviewed in the EPIC EMR & discussed w/ the patient...   Assessment:      Chol>  FLP remains deranged on diet alone (+RYR) & she seems genuinely surprized since she exercises all the time; explained the hereditary nature of the prob & need for meds; she refuses to take meds.  Thyroid>  Thyroid function is wnl, nodules are palp & Sonar 12/13 showed most nodules stable in size but dom nodule left lower pole is NEW & subseq surg by DrByers 1/14 (see above)...  Ortho- LBP, wrist, shoulder> she is managed by DrFields & is improved overall...  Osteop> BMD 6/09 at [facility]- TScores -2.9 in Spine, and -2.1 in left  FemNeck; treated w/ Alendronate 70mg /wk but pt stopped on her own "I'm on a  sabbatical" and using calcium, MVI, VitD; f/u BMD 10/14 showed lowest Tscore -2.1 & she wants to continue calcium, MVI, VitD...  Anxiety> she was prev followed by Psychiatry (couldn't name the doctor); she stopped Lexapro (not helping) & wants to go back on Prozac40...     Plan:     SEE MED LIST ABOVE

## 2013-06-05 ENCOUNTER — Ambulatory Visit
Admission: RE | Admit: 2013-06-05 | Discharge: 2013-06-05 | Disposition: A | Discharge status: Home / Self Care | Payer: Medicare Other | Source: Ambulatory Visit | Attending: Pulmonary Disease | Admitting: Pulmonary Disease

## 2013-06-05 ENCOUNTER — Ambulatory Visit (INDEPENDENT_AMBULATORY_CARE_PROVIDER_SITE_OTHER)
Admission: RE | Admit: 2013-06-05 | Discharge: 2013-06-05 | Disposition: A | Discharge status: Home / Self Care | Payer: Medicare Other | Source: Ambulatory Visit | Attending: Pulmonary Disease | Admitting: Pulmonary Disease

## 2013-06-05 DIAGNOSIS — M810 Age-related osteoporosis without current pathological fracture: Secondary | ICD-10-CM

## 2013-06-20 ENCOUNTER — Other Ambulatory Visit: Payer: Medicare Other | Admitting: Sports Medicine

## 2013-06-30 ENCOUNTER — Ambulatory Visit (INDEPENDENT_AMBULATORY_CARE_PROVIDER_SITE_OTHER): Payer: Medicare Other

## 2013-06-30 DIAGNOSIS — Z23 Encounter for immunization: Secondary | ICD-10-CM

## 2013-07-05 ENCOUNTER — Ambulatory Visit (INDEPENDENT_AMBULATORY_CARE_PROVIDER_SITE_OTHER): Payer: Medicare Other | Admitting: Sports Medicine

## 2013-07-05 ENCOUNTER — Encounter: Payer: Self-pay | Admitting: Sports Medicine

## 2013-07-05 VITALS — BP 130/86 | HR 69 | Ht 65.0 in

## 2013-07-05 DIAGNOSIS — M810 Age-related osteoporosis without current pathological fracture: Secondary | ICD-10-CM

## 2013-07-05 DIAGNOSIS — M719 Bursopathy, unspecified: Secondary | ICD-10-CM

## 2013-07-05 DIAGNOSIS — M25511 Pain in right shoulder: Secondary | ICD-10-CM

## 2013-07-05 DIAGNOSIS — M7581 Other shoulder lesions, right shoulder: Secondary | ICD-10-CM

## 2013-07-05 DIAGNOSIS — M67919 Unspecified disorder of synovium and tendon, unspecified shoulder: Secondary | ICD-10-CM

## 2013-07-05 DIAGNOSIS — M25519 Pain in unspecified shoulder: Secondary | ICD-10-CM

## 2013-07-05 MED ORDER — AMITRIPTYLINE HCL 10 MG PO TABS: 10.0000 mg | ORAL_TABLET | Freq: Every day | ORAL | Status: DC

## 2013-07-05 MED ORDER — NITROGLYCERIN 0.2 MG/HR TD PT24: MEDICATED_PATCH | TRANSDERMAL | Status: DC

## 2013-07-05 NOTE — Patient Instructions (Signed)
Restart Nitroglycerin 1/2 patch per day on right shoulder  Start amitriptyline 10 mg at bedtime  Please do suggested exercises daily  Please follow up in 2 months  Thank you for seeing [place] today!

## 2013-07-05 NOTE — Assessment & Plan Note (Signed)
Subscap tear has done well and her pain is much less overall  SS tear has improved strength but on [place] now looks like probable full thickness with Calcifications  Restart NTG protocol and keep at 1/2 patch  Restart amitriptyline at 10 at night and cont this 3 to 6 mos  rescan January

## 2013-07-05 NOTE — Progress Notes (Signed)
  Subjective:    Patient ID: Carly Rhodes, female    DOB: 09/21/1945, 67 y.o.   MRN: [id number]  HPI  Pt presents to clinic for f/u of rt shoulder pain which is improving. Has increased ROM, pain with certain movements like cranking lawn mower, reaching behind.  Still has to sleep in recliner due to pain with rolling onto rt side. Did not stay on amitriptyline because there was not a refill.  She felt it helped a lot and helped her sleep  Has been not been using nitroglycerin on Vicodin in the last 2 weeks. This seemed to get rid of a lot of her pain Compliant with home exercises.     Review of Systems     Objective:   Physical Exam  NAD   Rt shoulder:  Full ROM  Tight on back scratch on rt  Good strength ER and IR Good strength Speed's and Adria Dill' and empty can negative  O'Brien's negative  Neck motion good   MSK [place] Subscap tear appears to have healed  SS tear is still present with calcification at insertion into foot plate This looks full thickness but no retraction  TM and IS look OK/  BT OK  Mushroom sing over [facility] joint     Assessment & Plan:

## 2013-07-05 NOTE — Assessment & Plan Note (Signed)
Overall rates as at least 75% better  I still do not think she should start body bump or other harder exercise  HEP with theraband taught today

## 2013-08-01 ENCOUNTER — Other Ambulatory Visit: Payer: Self-pay | Admitting: *Deleted

## 2013-08-01 MED ORDER — AMITRIPTYLINE HCL 10 MG PO TABS: ORAL_TABLET | ORAL | Status: DC

## 2013-08-14 ENCOUNTER — Telehealth: Payer: Self-pay | Admitting: Pulmonary Disease

## 2013-08-14 MED ORDER — AZITHROMYCIN 250 MG PO TABS: ORAL_TABLET | ORAL | Status: DC

## 2013-08-14 NOTE — Telephone Encounter (Signed)
Last visit 05-31-13. Pt states she flew over the weekend and on Saturday she began to have productive cough with clear phlegm, chest congestion, nasal congestion, sore throat, fatigue x 3 days. She denies any fever, body aches, chills. Pt states that she gets bronchitis every year and feels this is what she has. She states a zpak always helps. Please advise. Carron Curie, CMA Allergies  Allergen Reactions  . Procaine Other (See Comments)    REACTION:  Lowers blood pressure, "makes me loopy, can't move or talk"

## 2013-08-14 NOTE — Telephone Encounter (Signed)
Called, spoke with pt.  Informed her of below recs per Dr. Nadel.  She verbalized understanding of this, is aware zpak sent to [facility], and is to call back if symptoms do not improve or worsen and seek emergency care if needed. 

## 2013-08-14 NOTE — Telephone Encounter (Signed)
Per SN---  zpak #1  Take as directed Rest Fluids tylenol

## 2013-08-31 ENCOUNTER — Ambulatory Visit (INDEPENDENT_AMBULATORY_CARE_PROVIDER_SITE_OTHER): Payer: Medicare Other | Admitting: Sports Medicine

## 2013-08-31 ENCOUNTER — Encounter: Payer: Self-pay | Admitting: Sports Medicine

## 2013-08-31 VITALS — BP 124/83 | Ht 65.0 in | Wt 170.0 lb

## 2013-08-31 DIAGNOSIS — M67919 Unspecified disorder of synovium and tendon, unspecified shoulder: Secondary | ICD-10-CM

## 2013-08-31 DIAGNOSIS — M810 Age-related osteoporosis without current pathological fracture: Secondary | ICD-10-CM

## 2013-08-31 DIAGNOSIS — M7581 Other shoulder lesions, right shoulder: Secondary | ICD-10-CM

## 2013-08-31 DIAGNOSIS — M719 Bursopathy, unspecified: Secondary | ICD-10-CM

## 2013-08-31 MED ORDER — HYDROCODONE-ACETAMINOPHEN 5-325 MG PO TABS: 1.0000 | ORAL_TABLET | Freq: Three times a day (TID) | ORAL | Status: DC | PRN

## 2013-08-31 NOTE — Progress Notes (Signed)
   Subjective:    Patient ID: Carly Rhodes, female    DOB: 05/23/1946, 68 y.o.   MRN: [id number]  HPI Carly Rhodes is a 68yo female with documented right rotator cuff tear who presents for follow-up.  She admits that she has not been doing her prescribed exercises, but that she did try to move a heavy couch 2-3 weeks ago and is afraid that she may have reinjured her shoulder when doing this.  She denies any popping or tearing or dislocating sensations in her shoulder, but feels that it has been more sore lately.  She compares it to a "toothache," and says the pain comes and goes.  She denies any new weakness or numbness, but does complain of an occasional burning sensation in her dorsal forearm.  She adds that she has had some right foot discomfort at the middle of her arch, and wonders if her store-bought orthotics are appropriate for this.  Finally, she would like a refill of her vicodin at this time.  She uses this only for night pain.  Since RC tear she has slept in chair.   Review of Systems ROS are otherwise negative or stable except as indicated in the HPI.    Objective:   Physical Exam Gen:  Anxious-appearing, NAD BP 124/83  Ht 5\' 5"  (1.651 m)  Wt 170 lb (77.111 kg)  BMI 28.29 kg/m2  MSK:  Right shoulder:  No gross deformities, full ROM, no TTP, Strength 4-/5 ER, 5/5 otherwise; positive O'Brien's; negative Empty Cans, Neer's, Hawkin's, and Cross-over tests.  Negative anterior or posterior apprehension.  Imaging: U/S:  Right shoulder shows some minor calcification of the subscapular tendon without significant disruption or effusion.  Supraspinatus still shows a tear, however it is improved from previous scans. Looks partial thickness.  Less than 1.5 cms.  Minimal gapping with motion Infraspinatus and teres minor normal.  Biceps tendon norm.       Assessment & Plan:  This is an anxious 68yo female who presents for follow-up of her right rotator cuff tear with concern for  reinjury after lifting a heavy couch.  She shows improvement of her right shoulder strength suggesting some resolution of her original tears in the supraspinatus and subscapularis muscles.  However, her right shoulder ER is still weak.  U/S suggests resolution of her right subscapular tear and improvement of her supraspinatus tear.  Plan: - Exercise as tolerated, avoid overhead activities Avoid heavy lifting - Refill vicodin - Arch pads added to shoe inserts - F/u in 2-3 months to U/S right shoulder

## 2013-08-31 NOTE — Assessment & Plan Note (Signed)
The RC tear is less symptomatic and also smaller at Supraspinatus.  subscap has healed.  Cont on HEP and avoid heavy lifting.

## 2013-09-09 ENCOUNTER — Emergency Department (HOSPITAL_COMMUNITY)
Admission: EM | Admit: 2013-09-09 | Discharge: 2013-09-09 | Disposition: A | Discharge status: Home / Self Care | Payer: Medicare Other | Attending: Emergency Medicine | Admitting: Emergency Medicine

## 2013-09-09 ENCOUNTER — Ambulatory Visit: Payer: Medicare Other

## 2013-09-09 ENCOUNTER — Other Ambulatory Visit: Payer: Self-pay | Admitting: Family Medicine

## 2013-09-09 ENCOUNTER — Ambulatory Visit (INDEPENDENT_AMBULATORY_CARE_PROVIDER_SITE_OTHER): Payer: Medicare Other | Admitting: Family Medicine

## 2013-09-09 ENCOUNTER — Encounter (HOSPITAL_COMMUNITY): Payer: Self-pay | Admitting: Emergency Medicine

## 2013-09-09 ENCOUNTER — Emergency Department (HOSPITAL_COMMUNITY): Payer: Medicare Other

## 2013-09-09 VITALS — BP 138/86 | HR 97 | Temp 98.5°F | Resp 16

## 2013-09-09 DIAGNOSIS — R079 Chest pain, unspecified: Secondary | ICD-10-CM

## 2013-09-09 DIAGNOSIS — S0003XA Contusion of scalp, initial encounter: Secondary | ICD-10-CM | POA: Insufficient documentation

## 2013-09-09 DIAGNOSIS — R519 Headache, unspecified: Secondary | ICD-10-CM

## 2013-09-09 DIAGNOSIS — Z7982 Long term (current) use of aspirin: Secondary | ICD-10-CM | POA: Insufficient documentation

## 2013-09-09 DIAGNOSIS — F411 Generalized anxiety disorder: Secondary | ICD-10-CM | POA: Insufficient documentation

## 2013-09-09 DIAGNOSIS — R51 Headache: Principal | ICD-10-CM

## 2013-09-09 DIAGNOSIS — Z8739 Personal history of other diseases of the musculoskeletal system and connective tissue: Secondary | ICD-10-CM | POA: Insufficient documentation

## 2013-09-09 DIAGNOSIS — M25569 Pain in unspecified knee: Secondary | ICD-10-CM

## 2013-09-09 DIAGNOSIS — M25551 Pain in right hip: Secondary | ICD-10-CM

## 2013-09-09 DIAGNOSIS — W19XXXA Unspecified fall, initial encounter: Secondary | ICD-10-CM

## 2013-09-09 DIAGNOSIS — F3289 Other specified depressive episodes: Secondary | ICD-10-CM | POA: Insufficient documentation

## 2013-09-09 DIAGNOSIS — Y92410 Unspecified street and highway as the place of occurrence of the external cause: Secondary | ICD-10-CM | POA: Insufficient documentation

## 2013-09-09 DIAGNOSIS — R42 Dizziness and giddiness: Secondary | ICD-10-CM | POA: Insufficient documentation

## 2013-09-09 DIAGNOSIS — M25559 Pain in unspecified hip: Secondary | ICD-10-CM

## 2013-09-09 DIAGNOSIS — Y9389 Activity, other specified: Secondary | ICD-10-CM | POA: Insufficient documentation

## 2013-09-09 DIAGNOSIS — S8001XA Contusion of right knee, initial encounter: Secondary | ICD-10-CM

## 2013-09-09 DIAGNOSIS — S0083XA Contusion of other part of head, initial encounter: Secondary | ICD-10-CM

## 2013-09-09 DIAGNOSIS — Z79899 Other long term (current) drug therapy: Secondary | ICD-10-CM | POA: Insufficient documentation

## 2013-09-09 DIAGNOSIS — IMO0002 Reserved for concepts with insufficient information to code with codable children: Secondary | ICD-10-CM | POA: Insufficient documentation

## 2013-09-09 DIAGNOSIS — T07XXXA Unspecified multiple injuries, initial encounter: Secondary | ICD-10-CM

## 2013-09-09 DIAGNOSIS — S022XXA Fracture of nasal bones, initial encounter for closed fracture: Secondary | ICD-10-CM

## 2013-09-09 DIAGNOSIS — R110 Nausea: Secondary | ICD-10-CM | POA: Insufficient documentation

## 2013-09-09 DIAGNOSIS — S1093XA Contusion of unspecified part of neck, initial encounter: Secondary | ICD-10-CM

## 2013-09-09 DIAGNOSIS — S8000XA Contusion of unspecified knee, initial encounter: Secondary | ICD-10-CM | POA: Insufficient documentation

## 2013-09-09 DIAGNOSIS — Z9089 Acquired absence of other organs: Secondary | ICD-10-CM | POA: Insufficient documentation

## 2013-09-09 DIAGNOSIS — T148XXA Other injury of unspecified body region, initial encounter: Secondary | ICD-10-CM

## 2013-09-09 DIAGNOSIS — F329 Major depressive disorder, single episode, unspecified: Secondary | ICD-10-CM | POA: Insufficient documentation

## 2013-09-09 DIAGNOSIS — Z8719 Personal history of other diseases of the digestive system: Secondary | ICD-10-CM | POA: Insufficient documentation

## 2013-09-09 MED ORDER — OXYCODONE-ACETAMINOPHEN 5-325 MG PO TABS: 1.0000 | ORAL_TABLET | Freq: Four times a day (QID) | ORAL | Status: DC | PRN

## 2013-09-09 MED ORDER — HYDROCODONE-ACETAMINOPHEN 5-325 MG PO TABS
1.0000 | ORAL_TABLET | Freq: Once | ORAL | Status: AC
  Administered 2013-09-09: 1 via ORAL
  Filled 2013-09-09: qty 1

## 2013-09-09 MED ORDER — FENTANYL CITRATE 0.05 MG/ML IJ SOLN
50.0000 ug | Freq: Once | INTRAMUSCULAR | Status: AC
  Administered 2013-09-09: 50 ug via INTRAMUSCULAR
  Filled 2013-09-09: qty 2

## 2013-09-09 MED ORDER — ONDANSETRON 4 MG PO TBDP
8.0000 mg | ORAL_TABLET | Freq: Once | ORAL | Status: AC
  Administered 2013-09-09: 8 mg via ORAL
  Filled 2013-09-09: qty 2

## 2013-09-09 MED ORDER — OXYCODONE-ACETAMINOPHEN 5-325 MG PO TABS: 1.0000 | ORAL_TABLET | Freq: Three times a day (TID) | ORAL | Status: DC | PRN

## 2013-09-09 NOTE — Discharge Instructions (Signed)
Your CT scan showed that you have a fracture to your nasal bone.  There were no other signs for concerning or emergent injuries at this time.  You may follow up with a maxilofacial specialist, Dr. Buelah Manis for continued treatment of your nasal bone fracture.  Please use the knee brace you were given as well as the crutches and follow up with your primary care provider or orthopedic specialist for continued evaluation and treatment of your knee injury.  Use Rest, Ice, Compression and Elevation to reduce swelling.    Abrasions An abrasion is a cut or scrape of the skin. Abrasions do not go through all layers of the skin. HOME CARE  If a bandage (dressing) was put on your wound, change it as told by your doctor. If the bandage sticks, soak it off with warm.  Wash the area with water and soap 2 times a day. Rinse off the soap. Pat the area dry with a clean towel.  Put on medicated cream (ointment) as told by your doctor.  Change your bandage right away if it gets wet or dirty.  Only take medicine as told by your doctor.  See your doctor within 24 48 hours to get your wound checked.  Check your wound for redness, puffiness (swelling), or yellowish-white fluid (pus). GET HELP RIGHT AWAY IF:   You have more pain in the wound.  You have redness, swelling, or tenderness around the wound.  You have pus coming from the wound.  You have a fever or lasting symptoms for more than 2 3 days.  You have a fever and your symptoms suddenly get worse.  You have a bad smell coming from the wound or bandage. MAKE SURE YOU:   Understand these instructions.  Will watch your condition.  Will get help right away if you are not doing well or get worse. Document Released: 01/20/2008 Document Revised: 04/27/2012 Document Reviewed: 07/07/2011 [facility] Patient Information 2014 [place], [name].    Nasal Fracture A fracture is a break in the bone. A nasal fracture is a broken nose. Minor breaks do not  need treatment. Serious breaks may need surgery.  HOME CARE  Put ice on the injured area.  Put ice in a plastic bag.  Place a towel between your skin and the bag.  Leave the ice on for 15-20 minutes, 03-04 times a day.  Only take medicine as told by your doctor.  If your nose bleeds, squeeze your nose shut gently. Sit upright for 10 minutes.  Do not play contact sports for 3 to 4 weeks or as told by your doctor. GET HELP RIGHT AWAY IF:   You have more pain or severe pain.  You keep having nosebleeds.  The shape of your nose does not return to normal after 5 days.  You have yellowish white fluid (pus) coming from your nose.  Your nose bleeds for over 20 minutes.  Clear fluid drains from your nose.  You have a grape-like puffiness (swelling) on the inside of your nose.  You have trouble moving your eyes.  You keep throwing up (vomiting). MAKE SURE YOU:   Understand these instructions.  Will watch this condition.  Will get help right away if you are not doing well or get worse. Document Released: 05/12/2008 Document Revised: 10/26/2011 Document Reviewed: 11/17/2010 [facility] Patient Information 2014 [place].   Contusion A contusion is a deep bruise. Contusions happen when an injury causes bleeding under the skin. Signs of bruising include pain, puffiness (  swelling), and discolored skin. The contusion may turn blue, purple, or yellow. HOME CARE   Put ice on the injured area.  Put ice in a plastic bag.  Place a towel between your skin and the bag.  Leave the ice on for 15-20 minutes, 03-04 times a day.  Only take medicine as told by your doctor.  Rest the injured area.  If possible, raise (elevate) the injured area to lessen puffiness. GET HELP RIGHT AWAY IF:   You have more bruising or puffiness.  You have pain that is getting worse.  Your puffiness or pain is not helped by medicine. MAKE SURE YOU:   Understand these instructions.  Will  watch your condition.  Will get help right away if you are not doing well or get worse. Document Released: 01/20/2008 Document Revised: 10/26/2011 Document Reviewed: 06/08/2011 [facility] Patient Information 2014 [place], [name].

## 2013-09-09 NOTE — ED Notes (Signed)
Pt given d/c instructions and verbalized understanding. NAD at this time.  
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
## 2013-09-09 NOTE — ED Notes (Signed)
Sent from urgent care for CT scan to evaluate for right orbital fx and follow up on right knee injury

## 2013-09-10 NOTE — ED Provider Notes (Signed)
Medical screening examination/treatment/procedure(s) were performed by non-physician practitioner and as supervising physician I was immediately available for consultation/collaboration.  EKG Interpretation   None         Blanchard Kelch, MD 09/10/13 [phone]

## 2013-09-12 ENCOUNTER — Telehealth (HOSPITAL_COMMUNITY): Payer: Self-pay | Admitting: Emergency Medicine

## 2013-09-12 NOTE — Telephone Encounter (Signed)
I explained to patient that I couldn't provide medical advice as she had not been seen by anyone in our practice and I suggested she contact her primary care with any questions.

## 2013-09-15 ENCOUNTER — Telehealth: Payer: Self-pay

## 2013-09-15 NOTE — Telephone Encounter (Signed)
PATIENT WOULD LIKE TO KNOW IF DID XRAYS OF HER UPPER RIBS SHE IS HURTING IN THE UPPER PART OF HER BODY NOW

## 2013-09-18 ENCOUNTER — Encounter: Payer: Self-pay | Admitting: Sports Medicine

## 2013-09-18 ENCOUNTER — Ambulatory Visit
Admission: RE | Admit: 2013-09-18 | Discharge: 2013-09-18 | Disposition: A | Discharge status: Home / Self Care | Payer: Medicare Other | Source: Ambulatory Visit | Attending: Sports Medicine | Admitting: Sports Medicine

## 2013-09-18 ENCOUNTER — Ambulatory Visit (INDEPENDENT_AMBULATORY_CARE_PROVIDER_SITE_OTHER): Payer: Medicare Other | Admitting: Sports Medicine

## 2013-09-18 ENCOUNTER — Telehealth: Payer: Self-pay | Admitting: *Deleted

## 2013-09-18 VITALS — BP 106/76 | HR 90 | Ht 65.0 in | Wt 170.0 lb

## 2013-09-18 DIAGNOSIS — R0781 Pleurodynia: Secondary | ICD-10-CM

## 2013-09-18 DIAGNOSIS — M79604 Pain in right leg: Secondary | ICD-10-CM

## 2013-09-18 DIAGNOSIS — M79609 Pain in unspecified limb: Secondary | ICD-10-CM

## 2013-09-18 DIAGNOSIS — R079 Chest pain, unspecified: Secondary | ICD-10-CM

## 2013-09-18 MED ORDER — MELOXICAM 7.5 MG PO TABS: 7.5000 mg | ORAL_TABLET | Freq: Every day | ORAL | Status: DC

## 2013-09-18 MED ORDER — HYDROCODONE-ACETAMINOPHEN 5-325 MG PO TABS: 1.0000 | ORAL_TABLET | Freq: Four times a day (QID) | ORAL | Status: DC | PRN
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
## 2013-09-18 NOTE — Telephone Encounter (Signed)
Message copied by Ocie Bob on Mon Sep 18, 2013  3:29 PM ------      Message from: Lilia Argue R      Created: Mon Sep 18, 2013  2:52 PM      Regarding: doppler US       Please call and tell her that her Doppler does not show any blood clot. She does have a hematoma in the front part of her knee as we suspected. She is to use her Ace wrap for compression and started moving her knee as pain allows. I want her up walking as much as possible as well but only with the assistance of her walker. She should have an appointment to see me again next week.      ----- Message -----         From: Rad Results In Interface         Sent: 09/18/2013   2:35 PM           To: Thurman Coyer, DO                   ------

## 2013-09-18 NOTE — Telephone Encounter (Signed)
Spoke with pt- gave her message from Dr. Micheline Chapman.
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
## 2013-09-18 NOTE — Telephone Encounter (Signed)
Spoke with pt- gave her message from Dr. Micheline Chapman. Advised per Dr. Micheline Chapman to take deep breaths to hopefully avoid pneumonia.

## 2013-09-25 ENCOUNTER — Ambulatory Visit (INDEPENDENT_AMBULATORY_CARE_PROVIDER_SITE_OTHER): Payer: Medicare Other | Admitting: Sports Medicine

## 2013-09-25 ENCOUNTER — Encounter: Payer: Self-pay | Admitting: Sports Medicine

## 2013-09-25 VITALS — BP 130/83 | HR 79 | Ht 65.0 in | Wt 170.0 lb

## 2013-09-25 DIAGNOSIS — S2249XA Multiple fractures of ribs, unspecified side, initial encounter for closed fracture: Secondary | ICD-10-CM

## 2013-09-25 DIAGNOSIS — M25579 Pain in unspecified ankle and joints of unspecified foot: Secondary | ICD-10-CM

## 2013-09-25 DIAGNOSIS — S2239XA Fracture of one rib, unspecified side, initial encounter for closed fracture: Secondary | ICD-10-CM

## 2013-09-25 DIAGNOSIS — S8010XA Contusion of unspecified lower leg, initial encounter: Secondary | ICD-10-CM

## 2013-09-25 DIAGNOSIS — S8011XA Contusion of right lower leg, initial encounter: Secondary | ICD-10-CM

## 2013-09-25 MED ORDER — HYDROCODONE-ACETAMINOPHEN 5-325 MG PO TABS: 1.0000 | ORAL_TABLET | Freq: Four times a day (QID) | ORAL | Status: DC | PRN

## 2013-09-25 MED ORDER — MELOXICAM 7.5 MG PO TABS: 7.5000 mg | ORAL_TABLET | Freq: Every day | ORAL | Status: DC
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
## 2013-09-25 NOTE — Patient Instructions (Signed)
Thank you for coming in today You are doing well overall.  Start exercising and let pain be your guide Please start by using an exercise bike, then the elliptical, then the treadmill or walking outside  Please use the meloxicam and vicodin as needed for pain Please return in 2 weeks

## 2013-09-29 ENCOUNTER — Other Ambulatory Visit: Payer: Self-pay | Admitting: Pulmonary Disease

## 2013-10-04 ENCOUNTER — Other Ambulatory Visit: Payer: Self-pay | Admitting: *Deleted

## 2013-10-04 MED ORDER — AMITRIPTYLINE HCL 10 MG PO TABS: ORAL_TABLET | ORAL | Status: DC

## 2013-10-09 ENCOUNTER — Ambulatory Visit: Payer: Medicare Other | Admitting: Sports Medicine

## 2013-10-09 ENCOUNTER — Ambulatory Visit (INDEPENDENT_AMBULATORY_CARE_PROVIDER_SITE_OTHER): Payer: Medicare Other | Admitting: Adult Health

## 2013-10-09 ENCOUNTER — Encounter: Payer: Self-pay | Admitting: Adult Health

## 2013-10-09 VITALS — BP 138/82 | HR 82 | Temp 96.9°F | Ht 65.0 in | Wt 174.8 lb

## 2013-10-09 DIAGNOSIS — J069 Acute upper respiratory infection, unspecified: Secondary | ICD-10-CM

## 2013-10-09 MED ORDER — HYDROCOD POLST-CHLORPHEN POLST 10-8 MG/5ML PO LQCR: 5.0000 mL | Freq: Two times a day (BID) | ORAL | Status: DC | PRN

## 2013-10-09 MED ORDER — AZITHROMYCIN 250 MG PO TABS: ORAL_TABLET | ORAL | Status: AC

## 2013-10-09 NOTE — Assessment & Plan Note (Signed)
Zpack take as directed  Mucinex DM Twice daily  As needed  Cough/congesgtion  Tussionex 1 tsp At bedtime  As needed  Cough, may make you sleepy. Fluids and rest  Push fluids  Please contact office for sooner follow up if symptoms do not improve or worsen or seek emergency care     

## 2013-10-09 NOTE — Patient Instructions (Addendum)
Zpack take as directed  Mucinex DM Twice daily  As needed  Cough/congesgtion  Tussionex 1 tsp At bedtime  As needed  Cough, may make you sleepy. Fluids and rest  Push fluids  Please contact office for sooner follow up if symptoms do not improve or worsen or seek emergency care
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
## 2013-10-13 ENCOUNTER — Ambulatory Visit: Payer: Medicare Other | Admitting: Adult Health

## 2013-10-17 ENCOUNTER — Ambulatory Visit: Payer: Medicare Other | Admitting: Adult Health

## 2013-10-18 ENCOUNTER — Encounter: Payer: Self-pay | Admitting: Sports Medicine

## 2013-10-18 ENCOUNTER — Ambulatory Visit (INDEPENDENT_AMBULATORY_CARE_PROVIDER_SITE_OTHER): Payer: Medicare Other | Admitting: Sports Medicine

## 2013-10-18 VITALS — BP 115/79 | Ht 65.0 in | Wt 172.0 lb

## 2013-10-18 DIAGNOSIS — M25562 Pain in left knee: Secondary | ICD-10-CM

## 2013-10-18 DIAGNOSIS — M25561 Pain in right knee: Secondary | ICD-10-CM

## 2013-10-18 DIAGNOSIS — M25569 Pain in unspecified knee: Secondary | ICD-10-CM

## 2013-10-18 DIAGNOSIS — M25469 Effusion, unspecified knee: Secondary | ICD-10-CM

## 2013-10-18 DIAGNOSIS — M25462 Effusion, left knee: Secondary | ICD-10-CM

## 2013-10-18 MED ORDER — MELOXICAM 7.5 MG PO TABS: 7.5000 mg | ORAL_TABLET | Freq: Every day | ORAL | Status: DC
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
## 2013-10-18 NOTE — Patient Instructions (Addendum)
Thank you for coming in today  1. Wear compression sleeve on left knee when you are going to be up on your feet or exercising 2. Continue elliptical and biking. Bike for at least 30 min 2 days per week 3. Restart meloxicam daily 4. Start knee rehab exercises - Straight leg raises 3x15 - Hip abduction 3x15  Followup 1 month

## 2013-10-19 ENCOUNTER — Telehealth: Payer: Self-pay | Admitting: Pulmonary Disease

## 2013-10-19 MED ORDER — METHYLPREDNISOLONE 4 MG PO KIT: PACK | ORAL | Status: DC

## 2013-10-19 NOTE — Telephone Encounter (Signed)
Called spoke with patient, advised of SN's recs as stated below.  Pt verbalized her understanding and denied any questions/concerns at this time.  Rx sent to verified pharmacy.  Nothing further needed; will sign off.

## 2013-10-19 NOTE — Telephone Encounter (Signed)
Per SN---  mucinex 600 mg  2 po bid regularly with increase in fluids Call in medrol dosepak as directed Nasal saline spray as needed.

## 2013-10-19 NOTE — Telephone Encounter (Signed)
Called spoke with patient who c/o slight wheezing, still having prod cough with light green/yellow mucus, head congestion x2 weeks -- reports her symptoms are improved since last ov w/ TP but symptoms are still lingering.  Pt is requesting is SN's recommendations.  Dr Lenna Gilford please advise, thank you.  Per the 2.23.15: Patient Instructions     Zpack take as directed  Mucinex DM Twice daily As needed Cough/congesgtion  Tussionex 1 tsp At bedtime As needed Cough, may make you sleepy.  Fluids and rest  Push fluids  Please contact office for sooner follow up if symptoms do not improve or worsen or seek emergency care    Allergies  Allergen Reactions  . Other Other (See Comments)    hallucinations   . Procaine Other (See Comments)    REACTION:  Lowers blood pressure, "makes me loopy, can't move or talk"   Current Outpatient Prescriptions on File Prior to Visit  Medication Sig Dispense Refill  . acyclovir (ZOVIRAX) 400 MG tablet Take 400 mg by mouth daily as needed (for fever blisters).      Marland Kitchen alendronate (FOSAMAX) 70 MG tablet TAKE ONE TABLET BY MOUTH ONCE A WEEK **TAKE  WITH  A  FULL  GLASS  OF  WATER  ON  AN  EMPTY  STOMACH**  4 tablet  0  . ALPRAZolam (XANAX) 0.5 MG tablet Take 0.5 mg by mouth at bedtime.      Marland Kitchen amitriptyline (ELAVIL) 10 MG tablet Take 10 mg by mouth daily.      Marland Kitchen aspirin 325 MG tablet Take 325 mg by mouth daily.      Marland Kitchen b complex vitamins tablet Take 1 tablet by mouth daily.      . baclofen (LIORESAL) 10 MG tablet Take 20 mg by mouth at bedtime.      . calcium carbonate (OS-CAL) 600 MG TABS tablet Take 600 mg by mouth 2 (two) times daily with a meal.      . chlorpheniramine-HYDROcodone (TUSSIONEX PENNKINETIC ER) 10-8 MG/5ML LQCR Take 5 mLs by mouth every 12 (twelve) hours as needed for cough.  240 mL  0  . cholecalciferol (VITAMIN D) 1000 UNITS tablet Take 1,000 Units by mouth daily.      Marland Kitchen FLUoxetine (PROZAC) 40 MG capsule Take 1 capsule (40 mg total) by mouth daily.  30  capsule  5  . HYDROcodone-acetaminophen (NORCO/VICODIN) 5-325 MG per tablet Take 1 tablet by mouth every 6 (six) hours as needed.  30 tablet  none  . meloxicam (MOBIC) 7.5 MG tablet Take 1-2 tablets (7.5-15 mg total) by mouth daily.  30 tablet  0  . Multiple Vitamin (MULTIVITAMIN) tablet Take 1 tablet by mouth daily.      . Multiple Vitamins-Minerals (ALIVE WOMENS ENERGY) TABS Take 1 tablet by mouth daily.      Marland Kitchen oxyCODONE-acetaminophen (PERCOCET/ROXICET) 5-325 MG per tablet Take 1-2 tablets by mouth every 6 (six) hours as needed for severe pain.  10 tablet  0   No current facility-administered medications on file prior to visit.

## 2013-10-23 ENCOUNTER — Telehealth: Payer: Self-pay | Admitting: Pulmonary Disease

## 2013-10-23 NOTE — Telephone Encounter (Signed)
SN schedule is booked out.  Please set up appt with TP.  thanks

## 2013-10-23 NOTE — Telephone Encounter (Signed)
Spoke with pt. appt scheduled. Nothing further needed 

## 2013-10-23 NOTE — Telephone Encounter (Signed)
Pt states she is not getting any better.  Has 3 days left of Medrol dosepak.  Using Flonase daily and Mucinex 2 tabs once daily.  Pt states she cannot take Mucinex in evenings because she states it makes her cough worse.  C/o ongoing cough (clear) - Congestion in throat - Wheezing - SOB on exertion - Denies fever.  Pt not sure if she should be seen or give Medrol more time to work.  Please advise

## 2013-10-24 ENCOUNTER — Ambulatory Visit: Payer: Medicare Other | Admitting: Adult Health

## 2013-10-27 ENCOUNTER — Other Ambulatory Visit: Payer: Self-pay | Admitting: Pulmonary Disease

## 2013-11-02 ENCOUNTER — Telehealth: Payer: Self-pay | Admitting: Pulmonary Disease

## 2013-11-02 MED ORDER — ALENDRONATE SODIUM 70 MG PO TABS: 70.0000 mg | ORAL_TABLET | ORAL | Status: DC

## 2013-11-02 NOTE — Telephone Encounter (Signed)
Per SN---  Pt will need to try one of the satellite offices for [facility] to be set up with a new primary care.  Ok to give refills but the pt will need to set up with new primary care.  thanks

## 2013-11-02 NOTE — Telephone Encounter (Signed)
Rx has been sent in with additional refills.  Made patient aware of SN's retirement from primary care. She began screaming and crying on the phone. Pt wants SN's recommendation in who to see.  SN - please advise. Thanks.

## 2013-11-02 NOTE — Telephone Encounter (Signed)
lmtcb x1 for pt Needs to be made aware SN is retiring from [facility] 11/15/13. Need to set up with new provider.

## 2013-11-02 NOTE — Telephone Encounter (Signed)
Advised pt of SN's rec. She has been given the # to [employer] at [facility]. Nothing further is needed.

## 2013-11-18 ENCOUNTER — Other Ambulatory Visit: Payer: Self-pay | Admitting: Pulmonary Disease

## 2013-11-20 ENCOUNTER — Telehealth: Payer: Self-pay | Admitting: Pulmonary Disease

## 2013-11-20 NOTE — Telephone Encounter (Signed)
Per SN---  Ok for the pt to schedule an appt with anyone that she will want to see.  Called and spoke with pt and she is aware.

## 2013-11-20 NOTE — Telephone Encounter (Signed)
Called and spoke with pt and she stated that she cannot lose Dr. Lenna Gilford.  She is aware of the insurance issues and she stated that she is willing to pay out of pocket for  Any of her visits if her insuracne does not cover.  SN please advise if its ok for her to schedule an appt with you.  thanks

## 2013-11-21 ENCOUNTER — Ambulatory Visit (INDEPENDENT_AMBULATORY_CARE_PROVIDER_SITE_OTHER): Payer: Medicare Other | Admitting: Sports Medicine

## 2013-11-21 ENCOUNTER — Other Ambulatory Visit: Payer: Self-pay | Admitting: Pulmonary Disease

## 2013-11-21 ENCOUNTER — Encounter: Payer: Self-pay | Admitting: Sports Medicine

## 2013-11-21 VITALS — BP 116/70

## 2013-11-21 DIAGNOSIS — M25569 Pain in unspecified knee: Secondary | ICD-10-CM

## 2013-11-21 DIAGNOSIS — M67919 Unspecified disorder of synovium and tendon, unspecified shoulder: Secondary | ICD-10-CM

## 2013-11-21 DIAGNOSIS — M7581 Other shoulder lesions, right shoulder: Secondary | ICD-10-CM

## 2013-11-21 DIAGNOSIS — M25562 Pain in left knee: Secondary | ICD-10-CM

## 2013-11-21 DIAGNOSIS — M719 Bursopathy, unspecified: Secondary | ICD-10-CM

## 2013-11-21 MED ORDER — AMITRIPTYLINE HCL 10 MG PO TABS: 10.0000 mg | ORAL_TABLET | Freq: Every day | ORAL | Status: DC

## 2013-11-21 MED ORDER — HYDROCODONE-ACETAMINOPHEN 5-325 MG PO TABS: 1.0000 | ORAL_TABLET | Freq: Four times a day (QID) | ORAL | Status: DC | PRN

## 2013-11-21 NOTE — Assessment & Plan Note (Signed)
This is also much improved  Continue using the compression for walking or exercise  Try to incorporate some stationary biking  Recheck 3 months
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
## 2013-11-21 NOTE — Assessment & Plan Note (Signed)
This is much improved  Stay on a home exercise program  The nitroglycerin helped when she used it but she was fairly inconsistent so I did not suggest restarting  Recheck in 3 months if any sign of residual symptoms

## 2014-01-25 ENCOUNTER — Other Ambulatory Visit: Payer: Self-pay | Admitting: Pulmonary Disease

## 2014-02-13 ENCOUNTER — Telehealth: Payer: Self-pay | Admitting: Pulmonary Disease

## 2014-02-13 DIAGNOSIS — M810 Age-related osteoporosis without current pathological fracture: Secondary | ICD-10-CM

## 2014-02-13 MED ORDER — FLUOXETINE HCL 40 MG PO CAPS: 40.0000 mg | ORAL_CAPSULE | Freq: Every day | ORAL | Status: DC

## 2014-02-13 NOTE — Telephone Encounter (Signed)
Pt returned call, advised that her rx was sent to [facility] as requested Did speak with patient about SN changing from primary care physician to pulmonology -- pt stated that she has "decided I'm not going to change primary care doctors" and that her "mental state can't take changing doctors."  Last ov w/ SN 10.15.14, none upcoming Will sign off

## 2014-02-13 NOTE — Telephone Encounter (Signed)
lmomtcb x 1 to speak with pt.   Need to let her know that rx for the medication has been sent. Will also need to advise the pt that she will need to set up with new PCP---SN has switched over to pulmonary only as of April.

## 2014-03-22 ENCOUNTER — Telehealth: Payer: Self-pay | Admitting: *Deleted

## 2014-03-22 NOTE — Telephone Encounter (Signed)
cld pt, see telephone note

## 2014-04-10 ENCOUNTER — Ambulatory Visit (INDEPENDENT_AMBULATORY_CARE_PROVIDER_SITE_OTHER): Payer: Medicare Other | Admitting: Sports Medicine

## 2014-04-10 ENCOUNTER — Encounter: Payer: Self-pay | Admitting: Sports Medicine

## 2014-04-10 VITALS — BP 114/73 | Ht 65.0 in | Wt 170.0 lb

## 2014-04-10 DIAGNOSIS — M79644 Pain in right finger(s): Secondary | ICD-10-CM

## 2014-04-10 DIAGNOSIS — M25562 Pain in left knee: Secondary | ICD-10-CM

## 2014-04-10 DIAGNOSIS — M25569 Pain in unspecified knee: Secondary | ICD-10-CM

## 2014-04-10 DIAGNOSIS — M79609 Pain in unspecified limb: Secondary | ICD-10-CM

## 2014-04-10 MED ORDER — MELOXICAM 7.5 MG PO TABS: 7.5000 mg | ORAL_TABLET | Freq: Every day | ORAL | Status: DC

## 2014-04-10 MED ORDER — HYDROCODONE-ACETAMINOPHEN 5-325 MG PO TABS: 1.0000 | ORAL_TABLET | Freq: Four times a day (QID) | ORAL | Status: DC | PRN

## 2014-04-10 NOTE — Assessment & Plan Note (Signed)
Advised patient we could intervene with a cortisone knee injections help with this large effusion and her degenerative meniscus pain. Patient was not interested in the with injection today. Provided alternative treatment with continuing knee sleeve for the next 10 days. Anti-inflammatory control with a new prescription of MOBIC. Pain control with Vicodin as needed. Also provided patient with a medial heel which to help with the valgus stress on her medial joint line. Patient will notify us over the next 10-14 days if she's had any significant improvement in her knee pain otherwise will followup.

## 2014-04-10 NOTE — Assessment & Plan Note (Signed)
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 Ulnar collateral ligament is stable on exam with no significant pain. No pain over the scaphoid. Advised patient to continued anti-inflammatories prescribed her knee and this will likely come down some time. We'll reassess this in one month at her followup appointment.
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
## 2014-04-10 NOTE — Patient Instructions (Signed)
Continue biking as tolerated Wear knee sleeve daily for 10-14 days, may remove at bedtime Continue heal wedge in most worn shoes Follow up in 1 month

## 2014-04-17 ENCOUNTER — Other Ambulatory Visit: Payer: Self-pay | Admitting: Pulmonary Disease

## 2014-05-10 ENCOUNTER — Ambulatory Visit (INDEPENDENT_AMBULATORY_CARE_PROVIDER_SITE_OTHER): Payer: Medicare Other | Admitting: Sports Medicine

## 2014-05-10 ENCOUNTER — Encounter: Payer: Self-pay | Admitting: Sports Medicine

## 2014-05-10 VITALS — BP 116/80 | Ht 65.0 in | Wt 170.0 lb

## 2014-05-10 DIAGNOSIS — M25462 Effusion, left knee: Secondary | ICD-10-CM

## 2014-05-10 DIAGNOSIS — M79644 Pain in right finger(s): Secondary | ICD-10-CM

## 2014-05-10 DIAGNOSIS — G479 Sleep disorder, unspecified: Secondary | ICD-10-CM

## 2014-05-10 DIAGNOSIS — M25469 Effusion, unspecified knee: Secondary | ICD-10-CM

## 2014-05-10 DIAGNOSIS — M79609 Pain in unspecified limb: Secondary | ICD-10-CM

## 2014-05-10 MED ORDER — AMITRIPTYLINE HCL 10 MG PO TABS: 10.0000 mg | ORAL_TABLET | Freq: Every day | ORAL | Status: DC
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
## 2014-05-23 ENCOUNTER — Other Ambulatory Visit: Payer: Self-pay | Admitting: Pulmonary Disease

## 2014-05-28 ENCOUNTER — Telehealth: Payer: Self-pay | Admitting: Pulmonary Disease

## 2014-05-28 NOTE — Telephone Encounter (Signed)
Pt scheduled for OV with SN for Annual and flu shot 05/29/14 at 3pm Nothing further needed.

## 2014-05-29 ENCOUNTER — Ambulatory Visit (INDEPENDENT_AMBULATORY_CARE_PROVIDER_SITE_OTHER): Payer: Medicare Other | Admitting: Pulmonary Disease

## 2014-05-29 ENCOUNTER — Encounter: Payer: Self-pay | Admitting: Pulmonary Disease

## 2014-05-29 VITALS — BP 110/68 | HR 71 | Temp 98.2°F | Ht 65.0 in | Wt 162.2 lb

## 2014-05-29 DIAGNOSIS — M7581 Other shoulder lesions, right shoulder: Secondary | ICD-10-CM

## 2014-05-29 DIAGNOSIS — K8020 Calculus of gallbladder without cholecystitis without obstruction: Secondary | ICD-10-CM

## 2014-05-29 DIAGNOSIS — E7800 Pure hypercholesterolemia, unspecified: Secondary | ICD-10-CM

## 2014-05-29 DIAGNOSIS — M25462 Effusion, left knee: Secondary | ICD-10-CM

## 2014-05-29 DIAGNOSIS — F341 Dysthymic disorder: Secondary | ICD-10-CM

## 2014-05-29 DIAGNOSIS — M810 Age-related osteoporosis without current pathological fracture: Secondary | ICD-10-CM

## 2014-05-29 DIAGNOSIS — E042 Nontoxic multinodular goiter: Secondary | ICD-10-CM
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
## 2014-05-29 NOTE — Patient Instructions (Signed)
Today we updated your med list in our EPIC system...    Continue your current medications the same...  We refille4d the meds you requested...  Please return to our lab at your convenience for your FASTING blood work...    We will contact you w/ the results when available...   Keep up the great job w/ your diet & exercise program....  Call for any questions...  Let's plan a follow up visit in 54yr, sooner if needed for problems.Marland KitchenMarland Kitchen

## 2014-05-30 ENCOUNTER — Other Ambulatory Visit (INDEPENDENT_AMBULATORY_CARE_PROVIDER_SITE_OTHER): Payer: Medicare Other

## 2014-05-30 DIAGNOSIS — M810 Age-related osteoporosis without current pathological fracture: Secondary | ICD-10-CM

## 2014-05-30 DIAGNOSIS — E7800 Pure hypercholesterolemia, unspecified: Secondary | ICD-10-CM

## 2014-05-30 DIAGNOSIS — M7581 Other shoulder lesions, right shoulder: Secondary | ICD-10-CM

## 2014-05-30 DIAGNOSIS — E042 Nontoxic multinodular goiter: Secondary | ICD-10-CM

## 2014-05-30 LAB — BASIC METABOLIC PANEL
BUN: 19 mg/dL (ref 6–23)
CO2: 27 mEq/L (ref 19–32)
CREATININE: 0.8 mg/dL (ref 0.4–1.2)
Calcium: 9.2 mg/dL (ref 8.4–10.5)
Chloride: 104 mEq/L (ref 96–112)
GFR: 78.84 mL/min (ref >60.00)
Glucose, Bld: 98 mg/dL (ref 70–99)
Potassium: 4.2 mEq/L (ref 3.5–5.1)
SODIUM: 137 meq/L (ref 135–145)

## 2014-05-30 LAB — VITAMIN D 25 HYDROXY (VIT D DEFICIENCY, FRACTURES): VITD: 61.8 ng/mL (ref 30.00–100.00)

## 2014-05-30 LAB — CBC WITH DIFFERENTIAL/PLATELET
Basophils Absolute: 0 10*3/uL (ref 0.0–0.1)
Basophils Relative: 0.6 % (ref 0.0–3.0)
EOS PCT: 1.9 % (ref 0.0–5.0)
Eosinophils Absolute: 0.1 10*3/uL (ref 0.0–0.7)
HEMATOCRIT: 42 % (ref 36.0–46.0)
HEMOGLOBIN: 13.7 g/dL (ref 12.0–15.0)
LYMPHS ABS: 2.4 10*3/uL (ref 0.7–4.0)
LYMPHS PCT: 33.9 % (ref 12.0–46.0)
MCHC: 32.7 g/dL (ref 30.0–36.0)
MCV: 85.1 fl (ref 78.0–100.0)
MONOS PCT: 7.2 % (ref 3.0–12.0)
Monocytes Absolute: 0.5 10*3/uL (ref 0.1–1.0)
Neutro Abs: 4 10*3/uL (ref 1.4–7.7)
Neutrophils Relative %: 56.4 % (ref 43.0–77.0)
Platelets: 233 10*3/uL (ref 150.0–400.0)
RBC: 4.93 Mil/uL (ref 3.87–5.11)
RDW: 14.5 % (ref 11.5–15.5)
WBC: 7.1 10*3/uL (ref 4.0–10.5)

## 2014-05-30 LAB — LIPID PANEL
Cholesterol: 226 mg/dL — ABNORMAL HIGH (ref 0–200)
HDL: 40.1 mg/dL (ref >39.00)
LDL CALC: 165 mg/dL — AB (ref 0–99)
NonHDL: 185.9
TRIGLYCERIDES: 106 mg/dL (ref 0.0–149.0)
Total CHOL/HDL Ratio: 6
VLDL: 21.2 mg/dL (ref 0.0–40.0)

## 2014-05-30 LAB — HEPATIC FUNCTION PANEL
ALBUMIN: 3.6 g/dL (ref 3.5–5.2)
ALT: 23 U/L (ref 0–35)
AST: 30 U/L (ref 0–37)
Alkaline Phosphatase: 65 U/L (ref 39–117)
Bilirubin, Direct: 0.1 mg/dL (ref 0.0–0.3)
Total Bilirubin: 1.1 mg/dL (ref 0.2–1.2)
Total Protein: 7.2 g/dL (ref 6.0–8.3)

## 2014-05-30 LAB — TSH: TSH: 0.75 u[IU]/mL (ref 0.35–4.50)

## 2014-05-31 ENCOUNTER — Telehealth: Payer: Self-pay | Admitting: Pulmonary Disease

## 2014-05-31 NOTE — Telephone Encounter (Signed)
Called and spoke with pt and she stated that she would rather not take the statin meds.  She would like to take the medication that you talked to her about at her visit the other day.  SN please advise. Thanks  Allergies  Allergen Reactions  . Other Other (See Comments)    hallucinations   . Procaine Other (See Comments)    REACTION:  Lowers blood pressure, "makes me loopy, can't move or talk"    Current Outpatient Prescriptions on File Prior to Visit  Medication Sig Dispense Refill  . acyclovir (ZOVIRAX) 400 MG tablet Take 400 mg by mouth daily as needed (for fever blisters).      Marland Kitchen alendronate (FOSAMAX) 70 MG tablet TAKE ONE TABLET BY MOUTH ONCE A WEEK ON EMPTY STOMACH WITH  A  FULL  GLASS  OF  WATER  4 tablet  0  . ALPRAZolam (XANAX) 0.5 MG tablet TAKE ONE-HALF TO ONE TABLET BY MOUTH THREE TIMES DAILY AS NEEDED  90 tablet  5  . aspirin 325 MG tablet Take 325 mg by mouth daily.      Marland Kitchen b complex vitamins tablet Take 1 tablet by mouth daily.      . baclofen (LIORESAL) 10 MG tablet Take 10 mg by mouth at bedtime.       . calcium carbonate (OS-CAL) 600 MG TABS tablet Take 600 mg by mouth 2 (two) times daily with a meal.      . cholecalciferol (VITAMIN D) 1000 UNITS tablet Take 1,000 Units by mouth daily.      Marland Kitchen FLUoxetine (PROZAC) 40 MG capsule Take 1 capsule (40 mg total) by mouth daily.  30 capsule  5  . HYDROcodone-acetaminophen (NORCO/VICODIN) 5-325 MG per tablet Take 1 tablet by mouth every 6 (six) hours as needed.  30 tablet  none  . methylPREDNISolone (MEDROL DOSEPAK) 4 MG tablet follow package directions  21 tablet  0  . Multiple Vitamins-Minerals (ALIVE WOMENS ENERGY) TABS Take 1 tablet by mouth daily.       No current facility-administered medications on file prior to visit.

## 2014-06-01 MED ORDER — ALENDRONATE SODIUM 70 MG PO TABS: ORAL_TABLET | ORAL | Status: DC

## 2014-06-01 MED ORDER — EZETIMIBE 10 MG PO TABS: 10.0000 mg | ORAL_TABLET | Freq: Every day | ORAL | Status: DC

## 2014-06-01 MED ORDER — ALPRAZOLAM 0.5 MG PO TABS: ORAL_TABLET | ORAL | Status: DC

## 2014-06-01 NOTE — Telephone Encounter (Signed)
Per SN---  Ok to call in zetia 10 mg  #30  1 daily and refill prn.    Tell her there is no generic for this medication.  i called and spoke with pt and she is aware and nothing further is needed.  Copy of low fat/low chol diet and copy of her labs have been left up front for her.

## 2014-06-14 ENCOUNTER — Telehealth: Payer: Self-pay | Admitting: Pulmonary Disease

## 2014-06-14 MED ORDER — AZITHROMYCIN 250 MG PO TABS: ORAL_TABLET | ORAL | Status: DC

## 2014-06-14 NOTE — Telephone Encounter (Signed)
Called and spoke with pt and she stated that she has bronchitis now and is wanting to know if she can have a zpak called in.  I spoke with SN and he is ok with sending in a zpak to her pharmacy.  Pt to call back if not getting better.  Pt is aware and nothing further is needed.

## 2014-07-02 ENCOUNTER — Ambulatory Visit (INDEPENDENT_AMBULATORY_CARE_PROVIDER_SITE_OTHER): Payer: Medicare Other

## 2014-07-02 DIAGNOSIS — Z23 Encounter for immunization: Secondary | ICD-10-CM

## 2014-07-03 ENCOUNTER — Other Ambulatory Visit: Payer: Self-pay | Admitting: Pulmonary Disease

## 2014-07-03 ENCOUNTER — Ambulatory Visit: Payer: Medicare Other | Admitting: *Deleted

## 2014-07-03 DIAGNOSIS — Z1231 Encounter for screening mammogram for malignant neoplasm of breast: Secondary | ICD-10-CM

## 2014-07-03 DIAGNOSIS — Z23 Encounter for immunization: Secondary | ICD-10-CM

## 2014-07-19 ENCOUNTER — Ambulatory Visit (HOSPITAL_COMMUNITY): Payer: Medicare Other

## 2014-07-19 ENCOUNTER — Ambulatory Visit
Admission: RE | Admit: 2014-07-19 | Discharge: 2014-07-19 | Disposition: A | Discharge status: Home / Self Care | Payer: Medicare Other | Source: Ambulatory Visit | Attending: Pulmonary Disease | Admitting: Pulmonary Disease

## 2014-07-19 DIAGNOSIS — Z1231 Encounter for screening mammogram for malignant neoplasm of breast: Secondary | ICD-10-CM

## 2014-11-12 ENCOUNTER — Other Ambulatory Visit: Payer: Self-pay | Admitting: Pulmonary Disease

## 2014-12-12 ENCOUNTER — Other Ambulatory Visit: Payer: Self-pay | Admitting: Sports Medicine

## 2014-12-12 NOTE — Telephone Encounter (Signed)
Is this ok to fill? 

## 2014-12-25 ENCOUNTER — Ambulatory Visit (INDEPENDENT_AMBULATORY_CARE_PROVIDER_SITE_OTHER): Payer: PPO | Admitting: Pulmonary Disease

## 2014-12-25 ENCOUNTER — Encounter: Payer: Self-pay | Admitting: Pulmonary Disease

## 2014-12-25 VITALS — BP 118/80 | HR 67 | Temp 97.9°F | Wt 168.0 lb

## 2014-12-25 DIAGNOSIS — M810 Age-related osteoporosis without current pathological fracture: Secondary | ICD-10-CM | POA: Diagnosis not present

## 2014-12-25 DIAGNOSIS — E7800 Pure hypercholesterolemia, unspecified: Secondary | ICD-10-CM

## 2014-12-25 MED ORDER — ALPRAZOLAM 0.5 MG PO TABS: ORAL_TABLET | ORAL | Status: DC
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
## 2014-12-25 NOTE — Patient Instructions (Signed)
Today we updated your med list in our EPIC system...    Continue your current medications the same...  We refilled the meds you requested...  Please return to our lab one morning soon for your FASTING lipid profile...    We will contact you w/ the results when available...   Let's get on track w/ our diet & exercise program, work on wt reduction...  Call for any questions...  Let's plan a follow up visit in 63mo, sooner if needed for problems.Marland KitchenMarland Kitchen

## 2014-12-26 ENCOUNTER — Ambulatory Visit: Payer: Self-pay | Admitting: Sports Medicine

## 2014-12-31 ENCOUNTER — Ambulatory Visit: Payer: PPO | Admitting: Sports Medicine

## 2015-01-02 ENCOUNTER — Other Ambulatory Visit (INDEPENDENT_AMBULATORY_CARE_PROVIDER_SITE_OTHER): Payer: PPO

## 2015-01-02 DIAGNOSIS — E7800 Pure hypercholesterolemia, unspecified: Secondary | ICD-10-CM

## 2015-01-02 LAB — LIPID PANEL
Cholesterol: 159 mg/dL (ref 0–200)
HDL: 40.3 mg/dL (ref >39.00)
LDL CALC: 90 mg/dL (ref 0–99)
NonHDL: 118.7
TRIGLYCERIDES: 143 mg/dL (ref 0.0–149.0)
Total CHOL/HDL Ratio: 4
VLDL: 28.6 mg/dL (ref 0.0–40.0)

## 2015-01-10 ENCOUNTER — Telehealth: Payer: Self-pay | Admitting: Pulmonary Disease

## 2015-01-10 MED ORDER — ALPRAZOLAM 0.5 MG PO TABS: ORAL_TABLET | ORAL | Status: DC

## 2015-01-10 NOTE — Telephone Encounter (Signed)
Per Dr Lenna Gilford, okay to refill Xanax .5mg  tablets x 5 refills.  Pt given Rx in lobby.  Nothing further needed.

## 2015-01-16 ENCOUNTER — Emergency Department (HOSPITAL_COMMUNITY)
Admission: EM | Admit: 2015-01-16 | Discharge: 2015-01-16 | Disposition: A | Discharge status: Home / Self Care | Payer: PPO | Attending: Emergency Medicine | Admitting: Emergency Medicine

## 2015-01-16 ENCOUNTER — Emergency Department (HOSPITAL_COMMUNITY): Payer: PPO

## 2015-01-16 ENCOUNTER — Encounter (HOSPITAL_COMMUNITY): Payer: Self-pay | Admitting: Emergency Medicine

## 2015-01-16 ENCOUNTER — Telehealth: Payer: Self-pay | Admitting: Pulmonary Disease

## 2015-01-16 DIAGNOSIS — Z8719 Personal history of other diseases of the digestive system: Secondary | ICD-10-CM | POA: Insufficient documentation

## 2015-01-16 DIAGNOSIS — Z23 Encounter for immunization: Secondary | ICD-10-CM | POA: Diagnosis not present

## 2015-01-16 DIAGNOSIS — S50312A Abrasion of left elbow, initial encounter: Secondary | ICD-10-CM | POA: Diagnosis not present

## 2015-01-16 DIAGNOSIS — Z79899 Other long term (current) drug therapy: Secondary | ICD-10-CM | POA: Insufficient documentation

## 2015-01-16 DIAGNOSIS — Z8709 Personal history of other diseases of the respiratory system: Secondary | ICD-10-CM | POA: Insufficient documentation

## 2015-01-16 DIAGNOSIS — Z7982 Long term (current) use of aspirin: Secondary | ICD-10-CM | POA: Diagnosis not present

## 2015-01-16 DIAGNOSIS — Z7983 Long term (current) use of bisphosphonates: Secondary | ICD-10-CM | POA: Insufficient documentation

## 2015-01-16 DIAGNOSIS — Y9289 Other specified places as the place of occurrence of the external cause: Secondary | ICD-10-CM | POA: Diagnosis not present

## 2015-01-16 DIAGNOSIS — Y998 Other external cause status: Secondary | ICD-10-CM | POA: Diagnosis not present

## 2015-01-16 DIAGNOSIS — F329 Major depressive disorder, single episode, unspecified: Secondary | ICD-10-CM | POA: Insufficient documentation

## 2015-01-16 DIAGNOSIS — S80212A Abrasion, left knee, initial encounter: Secondary | ICD-10-CM | POA: Diagnosis not present

## 2015-01-16 DIAGNOSIS — W19XXXA Unspecified fall, initial encounter: Secondary | ICD-10-CM

## 2015-01-16 DIAGNOSIS — M810 Age-related osteoporosis without current pathological fracture: Secondary | ICD-10-CM | POA: Diagnosis not present

## 2015-01-16 DIAGNOSIS — F419 Anxiety disorder, unspecified: Secondary | ICD-10-CM | POA: Insufficient documentation

## 2015-01-16 DIAGNOSIS — S0990XA Unspecified injury of head, initial encounter: Secondary | ICD-10-CM | POA: Insufficient documentation

## 2015-01-16 DIAGNOSIS — Y9389 Activity, other specified: Secondary | ICD-10-CM | POA: Insufficient documentation

## 2015-01-16 DIAGNOSIS — S59902A Unspecified injury of left elbow, initial encounter: Secondary | ICD-10-CM | POA: Diagnosis present

## 2015-01-16 MED ORDER — TETANUS-DIPHTH-ACELL PERTUSSIS 5-2.5-18.5 LF-MCG/0.5 IM SUSP
0.5000 mL | Freq: Once | INTRAMUSCULAR | Status: AC
  Administered 2015-01-16: 0.5 mL via INTRAMUSCULAR
  Filled 2015-01-16: qty 0.5
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
## 2015-01-16 NOTE — Discharge Instructions (Signed)
Bicycling Advice to Seniors Carly Rhodes, your CT scan and x-ray results are below. You need to follow-up with your primary care physician within 6 months to evaluate your thyroid. Also see orthopedic surgery within 3 days for close follow-up of her elbow, there may be a fracture in your elbow. Wear your sling until you see the orthopedic surgeon.  If you have any worsening pain or symptoms come back to emergency department immediately. Thank you.  CT head:IMPRESSION: 1. No evidence of acute intracranial abnormality. 2. No acute osseous abnormality identified in the cervical spine. 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 3. Prior left thyroidectomy. Heterogeneous, nodular enlargement of the right thyroid lobe and isthmus. Correlate with ultrasound.  Xray Elbow: IMPRESSION: No convincing fracture or dislocation. No joint effusion. Probable projectional lucency in the region of the radial head seen only on the left anterior oblique view.   Senior cyclists may be cycling for the first time in many years. So they will need to brush up on current laws and rules that relate to bicyclists and sharing the road. Some senior cyclists may simply be continuing a lifelong cycling habit. But as they grow older, they may be faced with physical issues that need new solutions. Senior cyclists should understand the health and environmental benefits of cycling. Senior cyclists may have decades of traffic experience, but they may not be accustomed to the ways that bicycles function in traffic today. A short bicycle course or workshop can be helpful in bringing them up to speed. Bicycles are required to ride on the right side of the road. They ride with traffic, not against it. This may be different than the way many seniors first learned to ride. Seniors must learn that:   This is the current law.  They may be ticketed for riding against traffic. Senior cyclists must learn:  About the  various styles of bikes.  Which bike will best suit their needs.  How to select and purchase a properly fitting helmet. Seniors should learn the different ways of carrying cargo and what lights and other accessories, such as a water bottle holder, they may need. They should be introduced to other options, such as gloves and glasses, and how they may benefit from these accessories. The senior cyclist must remember that as a bicyclist, he or she is a Physiological scientist. A cyclist is subject to the same laws as drivers of cars. Senior cyclists must learn to maintain a defensive riding attitude, even when the law and right-of-way are in their favor. They should anticipate what a driver is going to do. But they should not take it for granted that he or she will actually do it. Senior cyclists should never underestimate the importance of good motorist-to-cyclist communication through hand signals and eye contact. Just as they would do when driving a car, senior cyclists should scan traffic regularly by looking around and behind them as they ride. Some senior adults discover that it becomes more difficult to turn their heads to scan as they grow older. If so, they should have a rearview or side mirror mounted to their bike and learn to use it. The senior cyclist should learn how to make him or herself noticeable to others on the road by wearing bright, reflective clothing. Senior cyclists should learn how to safely navigate their way through intersections and complex traffic situations. They should also be able to recognize and avoid road hazards. Senior cyclists should explore the "science" of good route  selection, and take advantage of bike lanes, bike routes, and multi-use paths. City bicycling maps are generally available at bike shops. These maps are useful in finding such routes. Document Released: 10/24/2003 Document Revised: 12/18/2013 Document Reviewed: 06/27/2008 [facility] Patient Information 2015  [place], [name]. This information is not intended to replace advice given to you by your health care provider. Make sure you discuss any questions you have with your health care provider.  Radial Head Fracture A radial head fracture is a break of the radius bone in the forearm. The radial head is the part of the bone near the elbow. These breaks often happen during a fall when you land on the outstretched arm.  HOME CARE  Raise (elevate) the injured part while sitting or lying down.  Put ice on the injured area.  Put ice in a plastic bag.  Place a towel between your skin and the bag.  Leave the ice on for 15-20 minutes, 03-04 times a day.  Move your fingers.  If you have a plaster or fiberglass cast:  Do not try to scratch the skin under the cast.  Check the skin around the cast every day. Put lotion on any red or sore areas if needed.  Keep your cast dry and clean.  If you have a plaster splint:  Wear the splint as told.  Loosen the elastic around the splint if your fingers become numb, tingle, or turn cold or blue.  Do not put pressure on any part of your cast or splint. Rest your cast on a pillow for the first 24 hours.  Protect your cast or splint during bathing with a plastic bag. Do not put the cast or splint in water.  Only take medicine as told by your doctor.  Follow up with your doctor. This is important.  Do not over do exercises. GET HELP RIGHT AWAY IF:   Your cast or splint gets damaged or breaks.  You have more pain or puffiness (swelling) than you did before getting the cast.  You have severe pain when stretching your fingers.  There is a bad smell, new stains or yellowish white fluid (pus) coming from under the cast.  Your fingers or hand turn pale, blue, or become cold or lose feeling (numb). MAKE SURE YOU:  Understand these instructions.  Will watch your condition.  Will get help right away if you are not doing well or get worse. Document  Released: 07/22/2009 Document Revised: 10/26/2011 Document Reviewed: 07/22/2009 [facility] Patient Information 2015 [place], [name]. This information is not intended to replace advice given to you by your health care provider. Make sure you discuss any questions you have with your health care provider.
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
## 2015-01-24 ENCOUNTER — Other Ambulatory Visit: Payer: Self-pay | Admitting: Pulmonary Disease

## 2015-01-24 ENCOUNTER — Ambulatory Visit (INDEPENDENT_AMBULATORY_CARE_PROVIDER_SITE_OTHER): Payer: PPO | Admitting: Gynecology

## 2015-01-24 ENCOUNTER — Encounter: Payer: Self-pay | Admitting: Gynecology

## 2015-01-24 VITALS — BP 110/70 | Ht 65.0 in | Wt 165.0 lb

## 2015-01-24 DIAGNOSIS — Z79890 Hormone replacement therapy (postmenopausal): Secondary | ICD-10-CM

## 2015-01-24 DIAGNOSIS — N952 Postmenopausal atrophic vaginitis: Secondary | ICD-10-CM

## 2015-01-24 DIAGNOSIS — IMO0002 Reserved for concepts with insufficient information to code with codable children: Secondary | ICD-10-CM

## 2015-01-24 DIAGNOSIS — N8110 Cystocele, unspecified: Secondary | ICD-10-CM | POA: Diagnosis not present

## 2015-01-24 DIAGNOSIS — M810 Age-related osteoporosis without current pathological fracture: Secondary | ICD-10-CM | POA: Diagnosis not present

## 2015-01-24 DIAGNOSIS — Z01419 Encounter for gynecological examination (general) (routine) without abnormal findings: Secondary | ICD-10-CM

## 2015-01-24 DIAGNOSIS — IMO0001 Reserved for inherently not codable concepts without codable children: Secondary | ICD-10-CM

## 2015-01-24 DIAGNOSIS — N814 Uterovaginal prolapse, unspecified: Secondary | ICD-10-CM | POA: Insufficient documentation

## 2015-01-24 MED ORDER — NONFORMULARY OR COMPOUNDED ITEM: Status: DC

## 2015-01-24 NOTE — Progress Notes (Addendum)
Carly Rhodes November 12, 1945 [id number]   History:    69 y.o.  for breast and pelvic exam who has not had a pelvic exam since 2009. Patient many years ago by another provider had had an abdominal hysterectomy with some type of bladder suspension. Patient denies any urinary incontinence or any problems with bowel movements. She is complaining of some slight bulging sensation in her vagina. Dr. Michelle Rhodes is her PCP. Patient with history of osteoporosis on Fosamax 70 mg every weekly and there appears to be a compliance issue. She is due for her next bone density study October this year. She reports normal colonoscopy several years ago and is on schedule. She had a normal mammogram in December 2015. 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 She's playing on getting married in the next month. She is sexually active. Patient denies any past history of abnormal Pap smears in the past prior to her hysterectomy. Normal Pap smears after hysterectomy as well.  Past medical history,surgical history, family history and social history were all reviewed and documented in the EPIC chart.  Gynecologic History No LMP recorded. Patient has had a hysterectomy. Contraception: status post hysterectomy Last Pap: 2011. Results were: normal Last mammogram: 2015. Results were: normal  Obstetric History OB History  Gravida Para Term Preterm AB SAB TAB Ectopic Multiple Living  2 2        2     # Outcome Date GA Lbr Len/2nd Weight Sex Delivery Anes PTL Lv  2 Para           1 Para                ROS: A ROS was performed and pertinent positives and negatives are included in the history.  GENERAL: No fevers or chills. HEENT: No change in vision, no earache, sore throat or sinus congestion. NECK: No pain or stiffness. CARDIOVASCULAR: No chest pain or pressure. No palpitations. PULMONARY: No shortness of breath, cough or wheeze. GASTROINTESTINAL: No abdominal pain, nausea, vomiting or diarrhea, melena or bright  red blood per rectum. GENITOURINARY: No urinary frequency, urgency, hesitancy or dysuria. MUSCULOSKELETAL: No joint or muscle pain, no back pain, no recent trauma. DERMATOLOGIC: No rash, no itching, no lesions. ENDOCRINE: No polyuria, polydipsia, no heat or cold intolerance. No recent change in weight. HEMATOLOGICAL: No anemia or easy bruising or bleeding. NEUROLOGIC: No headache, seizures, numbness, tingling or weakness. PSYCHIATRIC: No depression, no loss of interest in normal activity or change in sleep pattern.     Exam: chaperone present  BP 110/70 mmHg  Ht 5\' 5"  (1.651 m)  Wt 165 lb (74.844 kg)  BMI 27.46 kg/m2  Body Rhodes index is 27.46 kg/(m^2).  General appearance : Well developed well nourished female. No acute distress HEENT: Eyes: no retinal hemorrhage or exudates,  Neck supple, trachea midline, no carotid bruits, no thyroidmegaly Lungs: Clear to auscultation, no rhonchi or wheezes, or rib retractions  Heart: Regular rate and rhythm, no murmurs or gallops Breast:Examined in sitting and supine position were symmetrical in appearance, no palpable masses or tenderness,  no skin retraction, no nipple inversion, no nipple discharge, no skin discoloration, no axillary or supraclavicular lymphadenopathy Abdomen: no palpable masses or tenderness, no rebound or guarding Extremities: no edema or skin discoloration or tenderness  Pelvic:  Bartholin, Urethra, Skene Glands: Within normal limits             Vagina: Second-degree cystocele  Cervix: Absent  Uterus  Absent  Adnexa  Without masses or tenderness  Anus and perineum  normal   Rectovaginal  normal sphincter tone without palpated masses or tenderness             Hemoccult PCP provides     Assessment/Plan:  68 y.o. female for breast and pelvic exam  new patient to the practice with evidence of a second-degree cystocele. She was examined in the erect position and there appears to be no evidence of enterocele or rectocele. I have  provided patient with several options as follows: #1 anterior colporrhaphy #2 pessary #3 watch and wait. She has decided to follow-up with a watch and wait approach and will begin using vaginal estrogenic cream twice a week. Literature information was provided. Pap smear not indicated and not done today. She was reminded that she needs to adhere to her Fosamax 70 mg weekly because of osteoporosis and a follow-up with her bone density study in October this year. She is due for her mammogram in December this year. She was reminded also the importance of calcium and vitamin D and regular exercise. She cycles regularly.   Carly Mass MD, 1:34 PM 01/24/2015

## 2015-01-24 NOTE — Patient Instructions (Signed)
Cystocele Repair Cystocele repair is surgery to remove a cystocele, which is a bulging, drooping area (hernia) of the bladder that extends into the vagina. This bulging occurs on the top front wall of the vagina. LET [facility] CARE PROVIDER KNOW ABOUT:   Any allergies you have.  All medicines you are taking, including vitamins, herbs, eye drops, creams, and over-the-counter medicines.  Use of steroids (by mouth or creams).  Previous problems you or members of your family have had with the use of anesthetics.  Any blood disorders you have.  Previous surgeries you have had.  Medical conditions you have.  Possibility of pregnancy, if this applies. RISKS AND COMPLICATIONS  Generally, this is a safe procedure. However, as with any procedure, complications can occur. Possible complications include:  Excessive bleeding.  Infection.  Injury to surrounding structures.  Problems related to anesthetics. The risks will vary depending on the type of anesthetic given.  Problems with the urinary catheter after surgery, such as blockage.  Return of the cystocele. BEFORE THE PROCEDURE   Ask your health care provider about changing or stopping your regular medicines. You may need to stop taking certain medicines 1 week before surgery.  Do not eat or drink anything after midnight the night before surgery.  If you smoke, do not smoke for at least 2 weeks before the surgery.  Do not drink any alcohol for 3 days before the surgery.  Arrange for someone to drive you home after your hospital stay and to help you with activities during recovery. PROCEDURE   You will be given a medicine that makes you sleep through the procedure (general anesthetic) or a medicine injected into your spine that numbs your body below the waist (spinal or epidural anesthetic). You will be asleep or be numbed through the entire procedure.  A thin, flexible tube (Foley catheter) will be placed in your bladder to  drain urine during and after the surgery.  The surgery is performed through the vagina. The front wall of the vagina is opened up, and the muscle between the bladder and vagina is pulled up to its normal position. This is reinforced with stitches or a piece of mesh. This removes the hernia so that the top of the vagina does not fall into the opening of the vagina.  The cut on the front wall of the vagina is then closed with absorbable stitches that do not need to be removed. AFTER THE PROCEDURE   You will be taken to a recovery area where your progress will be closely watched. Your breathing, blood pressure, and pulse (vital signs) will be checked often. When you are stable, you will be taken to a regular hospital room.  You will have a catheter in place to drain your bladder. This will stay in place for 2-7 days or until your bladder is working properly on its own.  You may have gauze packing in the vagina. This will be removed 1-2 days after the surgery.  You will be given pain medicine as needed and may be given a medicine that kills germs (antibiotic).  You will likely need to stay in the hospital for 1-2 days. Document Released: 07/31/2000 Document Revised: 04/05/2013 Document Reviewed: 01/20/2013 [employer] Patient Information 2015 [place], [name]. This information is not intended to replace advice given to you by your health care provider. Make sure you discuss any questions you have with your health care provider.

## 2015-01-31 ENCOUNTER — Telehealth: Payer: Self-pay | Admitting: Pulmonary Disease

## 2015-01-31 NOTE — Telephone Encounter (Signed)
Per SN: Recommend Scott Macdiarmid with [facility]

## 2015-01-31 NOTE — Telephone Encounter (Signed)
Spoke with pt. She is going to need surgery, she has been diagnosed with a prolapsed bladder. Wants SN's recommendation on who to go see.  SN - please advise. Thanks.

## 2015-01-31 NOTE — Telephone Encounter (Signed)
Spoke with pt, she is aware of SN's recs.  Nothing further needed.

## 2015-01-31 NOTE — Telephone Encounter (Signed)
Pt returning call.Carly Rhodes ° °

## 2015-01-31 NOTE — Telephone Encounter (Signed)
LMTCB x 1 

## 2015-02-11 ENCOUNTER — Other Ambulatory Visit: Payer: Self-pay | Admitting: Pulmonary Disease

## 2015-03-23 ENCOUNTER — Other Ambulatory Visit: Payer: Self-pay | Admitting: Pulmonary Disease

## 2015-04-17 ENCOUNTER — Other Ambulatory Visit: Payer: Self-pay | Admitting: Pulmonary Disease

## 2015-05-15 ENCOUNTER — Telehealth: Payer: Self-pay | Admitting: Pulmonary Disease

## 2015-05-15 NOTE — Telephone Encounter (Signed)
Pt is aware of recs. Nothing further needed 

## 2015-05-15 NOTE — Telephone Encounter (Signed)
Pt cb, [phone]

## 2015-05-15 NOTE — Telephone Encounter (Signed)
Spoke with pt. She reports last Thursday she had diarrhea. Since she has felt nauseated, she has an urge to pass a bowel but does not. She is taking immodium BID x last Thursday. She does have night sweats at times, feels tired/weak. She denies any chills, no vomiting, no fever, no stomach pain. She reports her uncle has "intestinal flu".  I advised pt to go home and rest and drink plenty of fluids. Requesting further recs. Please advise MW as SN off this week. thanks

## 2015-05-15 NOTE — Telephone Encounter (Signed)
Nothing else to rec > go to UC or ER prn - would stop calcium carbonate until feeling better

## 2015-05-15 NOTE — Telephone Encounter (Signed)
lmomtcb x1 for pt as she has left

## 2015-05-21 ENCOUNTER — Telehealth: Payer: Self-pay | Admitting: Pulmonary Disease

## 2015-05-21 ENCOUNTER — Ambulatory Visit (INDEPENDENT_AMBULATORY_CARE_PROVIDER_SITE_OTHER): Payer: PPO | Admitting: Pulmonary Disease

## 2015-05-21 VITALS — BP 100/78 | HR 72 | Temp 97.6°F | Resp 16

## 2015-05-21 DIAGNOSIS — F341 Dysthymic disorder: Secondary | ICD-10-CM | POA: Diagnosis not present

## 2015-05-21 DIAGNOSIS — H9202 Otalgia, left ear: Secondary | ICD-10-CM

## 2015-05-21 MED ORDER — ANTIPYRINE-BENZOCAINE 5.4-1.4 % OT SOLN: 2.0000 [drp] | Freq: Three times a day (TID) | OTIC | Status: DC

## 2015-05-21 NOTE — Patient Instructions (Signed)
Today we updated your med list in our EPIC system...    Continue your current medications the same...  We wrote for an ear medication to help your left ear pain...    Use the AURALGAN ear drops-- place 2 drops in the left ear up to 3 times daily as needed for the ear pain...  Let me know if there are any further problems or developments...  Call for any questions...  Marland Kitchen

## 2015-05-21 NOTE — Telephone Encounter (Signed)
Per SN >> he will see pt today.  Pt is aware that SN is going to see her today. Nothing further was needed.

## 2015-05-22 ENCOUNTER — Encounter: Payer: Self-pay | Admitting: Pulmonary Disease
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
## 2015-06-27 ENCOUNTER — Ambulatory Visit (INDEPENDENT_AMBULATORY_CARE_PROVIDER_SITE_OTHER): Payer: PPO | Admitting: Pulmonary Disease

## 2015-06-27 ENCOUNTER — Encounter: Payer: Self-pay | Admitting: Pulmonary Disease

## 2015-06-27 VITALS — BP 124/76 | HR 63 | Temp 98.3°F | Wt 164.6 lb

## 2015-06-27 DIAGNOSIS — M810 Age-related osteoporosis without current pathological fracture: Secondary | ICD-10-CM

## 2015-06-27 DIAGNOSIS — F341 Dysthymic disorder: Secondary | ICD-10-CM

## 2015-06-27 DIAGNOSIS — E7800 Pure hypercholesterolemia, unspecified: Secondary | ICD-10-CM | POA: Diagnosis not present

## 2015-06-27 DIAGNOSIS — M545 Low back pain, unspecified: Secondary | ICD-10-CM

## 2015-06-27 MED ORDER — ACYCLOVIR 400 MG PO TABS: 400.0000 mg | ORAL_TABLET | Freq: Three times a day (TID) | ORAL | Status: DC

## 2015-06-27 MED ORDER — EZETIMIBE 10 MG PO TABS: 10.0000 mg | ORAL_TABLET | Freq: Every day | ORAL | Status: DC

## 2015-06-27 MED ORDER — FLUOXETINE HCL 40 MG PO CAPS: 40.0000 mg | ORAL_CAPSULE | Freq: Every day | ORAL | Status: DC

## 2015-06-27 MED ORDER — ALPRAZOLAM 0.5 MG PO TABS: ORAL_TABLET | ORAL | Status: DC

## 2015-06-27 NOTE — Patient Instructions (Signed)
Today we updated your med list in our EPIC system...    Continue your current medications the same...    We refilled your meds per request...  Please return to our lab one morning soon for your FASTING blood work... We will also sched a f/u bone density test...    We will contact you w/ the results when available...   We gave you the 2016 FLU vaccine...  Call for any questions...  Let's plan a follow up visit in 76mo, sooner if needed for problems.Marland KitchenMarland Kitchen
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
## 2015-06-28 ENCOUNTER — Other Ambulatory Visit (INDEPENDENT_AMBULATORY_CARE_PROVIDER_SITE_OTHER): Payer: PPO

## 2015-06-28 DIAGNOSIS — M545 Low back pain, unspecified: Secondary | ICD-10-CM

## 2015-06-28 DIAGNOSIS — M810 Age-related osteoporosis without current pathological fracture: Secondary | ICD-10-CM | POA: Diagnosis not present

## 2015-06-28 DIAGNOSIS — F341 Dysthymic disorder: Secondary | ICD-10-CM | POA: Diagnosis not present

## 2015-06-28 DIAGNOSIS — E7800 Pure hypercholesterolemia, unspecified: Secondary | ICD-10-CM | POA: Diagnosis not present

## 2015-06-28 LAB — TSH: TSH: 0.74 u[IU]/mL (ref 0.35–4.50)

## 2015-06-28 LAB — HEPATIC FUNCTION PANEL
ALBUMIN: 4.1 g/dL (ref 3.5–5.2)
ALT: 22 U/L (ref 0–35)
AST: 22 U/L (ref 0–37)
Alkaline Phosphatase: 79 U/L (ref 39–117)
Bilirubin, Direct: 0.1 mg/dL (ref 0.0–0.3)
Total Bilirubin: 0.8 mg/dL (ref 0.2–1.2)
Total Protein: 6.5 g/dL (ref 6.0–8.3)

## 2015-06-28 LAB — CBC WITH DIFFERENTIAL/PLATELET
BASOS PCT: 0.7 % (ref 0.0–3.0)
Basophils Absolute: 0 10*3/uL (ref 0.0–0.1)
Eosinophils Absolute: 0.2 10*3/uL (ref 0.0–0.7)
Eosinophils Relative: 2.6 % (ref 0.0–5.0)
HCT: 43.5 % (ref 36.0–46.0)
HEMOGLOBIN: 14.4 g/dL (ref 12.0–15.0)
LYMPHS PCT: 32.6 % (ref 12.0–46.0)
Lymphs Abs: 2.1 10*3/uL (ref 0.7–4.0)
MCHC: 33.1 g/dL (ref 30.0–36.0)
MCV: 83.7 fl (ref 78.0–100.0)
MONO ABS: 0.4 10*3/uL (ref 0.1–1.0)
MONOS PCT: 6.7 % (ref 3.0–12.0)
Neutro Abs: 3.7 10*3/uL (ref 1.4–7.7)
Neutrophils Relative %: 57.4 % (ref 43.0–77.0)
PLATELETS: 217 10*3/uL (ref 150.0–400.0)
RBC: 5.2 Mil/uL — AB (ref 3.87–5.11)
RDW: 14.5 % (ref 11.5–15.5)
WBC: 6.4 10*3/uL (ref 4.0–10.5)

## 2015-06-28 LAB — BASIC METABOLIC PANEL
BUN: 12 mg/dL (ref 6–23)
CALCIUM: 9.6 mg/dL (ref 8.4–10.5)
CO2: 30 mEq/L (ref 19–32)
Chloride: 103 mEq/L (ref 96–112)
Creatinine, Ser: 0.84 mg/dL (ref 0.40–1.20)
GFR: 71.09 mL/min (ref >60.00)
GLUCOSE: 99 mg/dL (ref 70–99)
Potassium: 4.2 mEq/L (ref 3.5–5.1)
Sodium: 140 mEq/L (ref 135–145)

## 2015-06-28 LAB — VITAMIN D 25 HYDROXY (VIT D DEFICIENCY, FRACTURES): VITD: 52.42 ng/mL (ref 30.00–100.00)

## 2015-06-28 LAB — LIPID PANEL
CHOLESTEROL: 163 mg/dL (ref 0–200)
HDL: 44.6 mg/dL (ref >39.00)
LDL CALC: 82 mg/dL (ref 0–99)
NonHDL: 118.27
TRIGLYCERIDES: 179 mg/dL — AB (ref 0.0–149.0)
Total CHOL/HDL Ratio: 4
VLDL: 35.8 mg/dL (ref 0.0–40.0)

## 2015-06-29 LAB — HEPATITIS C ANTIBODY: HCV Ab: NEGATIVE

## 2015-07-01 ENCOUNTER — Ambulatory Visit (INDEPENDENT_AMBULATORY_CARE_PROVIDER_SITE_OTHER)
Admission: RE | Admit: 2015-07-01 | Discharge: 2015-07-01 | Disposition: A | Discharge status: Home / Self Care | Payer: PPO | Source: Ambulatory Visit | Attending: Pulmonary Disease | Admitting: Pulmonary Disease

## 2015-07-01 ENCOUNTER — Ambulatory Visit (INDEPENDENT_AMBULATORY_CARE_PROVIDER_SITE_OTHER): Payer: PPO

## 2015-07-01 ENCOUNTER — Telehealth: Payer: Self-pay | Admitting: Pulmonary Disease

## 2015-07-01 DIAGNOSIS — M810 Age-related osteoporosis without current pathological fracture: Secondary | ICD-10-CM | POA: Diagnosis not present

## 2015-07-01 DIAGNOSIS — Z23 Encounter for immunization: Secondary | ICD-10-CM | POA: Diagnosis not present

## 2015-07-01 NOTE — Telephone Encounter (Signed)
Patient came into the office requesting copy of Lab results and flu shot.   Patient given copy of her lab results and placed on injection schedule to have flu shot. Nothing further needed. Closing encounter

## 2015-07-09 ENCOUNTER — Telehealth: Payer: Self-pay | Admitting: Pulmonary Disease

## 2015-07-09 NOTE — Telephone Encounter (Signed)
Patient came by the office to get bone density test results. Printed out copy for patient and explained Dr. Jeannine Kitten recommendations. Nothing further needed. Closing encounter

## 2015-07-10 NOTE — Progress Notes (Signed)
Quick Note:  Called and spoke to pt. Pt came in to office on 07/09/15 and discussed results with a nurse. Please see phone note from 07/09/15 for more information. Nothing further needed. Will sign off. ______

## 2015-07-14 ENCOUNTER — Ambulatory Visit: Payer: PPO

## 2015-08-30 ENCOUNTER — Other Ambulatory Visit (HOSPITAL_COMMUNITY): Payer: Self-pay | Admitting: Otolaryngology

## 2015-08-30 DIAGNOSIS — E079 Disorder of thyroid, unspecified: Secondary | ICD-10-CM

## 2015-09-04 ENCOUNTER — Other Ambulatory Visit (HOSPITAL_COMMUNITY): Payer: Self-pay | Admitting: Otolaryngology

## 2015-09-05 ENCOUNTER — Other Ambulatory Visit (HOSPITAL_COMMUNITY): Payer: Self-pay | Admitting: Otolaryngology

## 2015-09-05 DIAGNOSIS — E042 Nontoxic multinodular goiter: Secondary | ICD-10-CM

## 2015-09-06 ENCOUNTER — Ambulatory Visit (HOSPITAL_COMMUNITY)
Admission: RE | Admit: 2015-09-06 | Discharge: 2015-09-06 | Disposition: A | Discharge status: Home / Self Care | Payer: PPO | Source: Ambulatory Visit | Attending: Otolaryngology | Admitting: Otolaryngology

## 2015-09-06 ENCOUNTER — Ambulatory Visit (HOSPITAL_COMMUNITY): Payer: PPO

## 2015-09-06 DIAGNOSIS — E049 Nontoxic goiter, unspecified: Secondary | ICD-10-CM | POA: Diagnosis not present

## 2015-09-06 DIAGNOSIS — E042 Nontoxic multinodular goiter: Secondary | ICD-10-CM | POA: Diagnosis not present

## 2015-12-09 ENCOUNTER — Other Ambulatory Visit: Payer: Self-pay | Admitting: Pulmonary Disease

## 2015-12-14 ENCOUNTER — Other Ambulatory Visit: Payer: Self-pay | Admitting: Pulmonary Disease

## 2015-12-25 ENCOUNTER — Ambulatory Visit (INDEPENDENT_AMBULATORY_CARE_PROVIDER_SITE_OTHER): Payer: PPO | Admitting: Pulmonary Disease

## 2015-12-25 VITALS — BP 100/68 | HR 70 | Temp 99.2°F | Ht 64.5 in | Wt 161.1 lb

## 2015-12-25 DIAGNOSIS — M159 Polyosteoarthritis, unspecified: Secondary | ICD-10-CM

## 2015-12-25 DIAGNOSIS — M545 Low back pain, unspecified: Secondary | ICD-10-CM

## 2015-12-25 DIAGNOSIS — E042 Nontoxic multinodular goiter: Secondary | ICD-10-CM | POA: Diagnosis not present

## 2015-12-25 DIAGNOSIS — F341 Dysthymic disorder: Secondary | ICD-10-CM

## 2015-12-25 DIAGNOSIS — E785 Hyperlipidemia, unspecified: Secondary | ICD-10-CM | POA: Diagnosis not present

## 2015-12-25 DIAGNOSIS — M1990 Unspecified osteoarthritis, unspecified site: Secondary | ICD-10-CM | POA: Insufficient documentation

## 2015-12-25 DIAGNOSIS — M150 Primary generalized (osteo)arthritis: Secondary | ICD-10-CM

## 2015-12-25 DIAGNOSIS — M8580 Other specified disorders of bone density and structure, unspecified site: Secondary | ICD-10-CM

## 2015-12-25 MED ORDER — ALPRAZOLAM 0.5 MG PO TABS: ORAL_TABLET | ORAL | Status: DC

## 2015-12-25 NOTE — Patient Instructions (Signed)
Today we updated your med list in our EPIC system...    Continue your current medications the same...  We refilled your Xanax per request...  We wrote for a SHINGLES SHOT (you may get this vaccination at [facility] or [facility])...  Stay as active as possible...  Call for any questions...  Let's plan a follow up visit in 31mo, sooner if needed for problems.Marland KitchenMarland Kitchen

## 2015-12-26 ENCOUNTER — Encounter: Payer: Self-pay | Admitting: Pulmonary Disease
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
## 2016-01-22 ENCOUNTER — Other Ambulatory Visit: Payer: Self-pay | Admitting: Pulmonary Disease

## 2016-02-22 ENCOUNTER — Other Ambulatory Visit: Payer: Self-pay | Admitting: Pulmonary Disease

## 2016-03-30 ENCOUNTER — Other Ambulatory Visit: Payer: Self-pay | Admitting: Pulmonary Disease

## 2016-04-12 ENCOUNTER — Other Ambulatory Visit: Payer: Self-pay | Admitting: Pulmonary Disease

## 2016-05-05 ENCOUNTER — Other Ambulatory Visit: Payer: Self-pay | Admitting: Pulmonary Disease

## 2016-05-27 DIAGNOSIS — H527 Unspecified disorder of refraction: Secondary | ICD-10-CM | POA: Diagnosis not present

## 2016-05-27 DIAGNOSIS — H25813 Combined forms of age-related cataract, bilateral: Secondary | ICD-10-CM | POA: Diagnosis not present

## 2016-05-27 DIAGNOSIS — H43391 Other vitreous opacities, right eye: Secondary | ICD-10-CM | POA: Diagnosis not present

## 2016-05-29 ENCOUNTER — Other Ambulatory Visit: Payer: Self-pay | Admitting: Pulmonary Disease

## 2016-05-29 DIAGNOSIS — Z1231 Encounter for screening mammogram for malignant neoplasm of breast: Secondary | ICD-10-CM

## 2016-06-01 ENCOUNTER — Ambulatory Visit: Payer: PPO

## 2016-06-16 ENCOUNTER — Ambulatory Visit (INDEPENDENT_AMBULATORY_CARE_PROVIDER_SITE_OTHER): Payer: PPO

## 2016-06-16 DIAGNOSIS — Z23 Encounter for immunization: Secondary | ICD-10-CM

## 2016-06-17 DIAGNOSIS — Z23 Encounter for immunization: Secondary | ICD-10-CM

## 2016-06-25 ENCOUNTER — Ambulatory Visit
Admission: RE | Admit: 2016-06-25 | Discharge: 2016-06-25 | Disposition: A | Discharge status: Home / Self Care | Payer: PPO | Source: Ambulatory Visit | Attending: Pulmonary Disease | Admitting: Pulmonary Disease

## 2016-06-25 DIAGNOSIS — Z1231 Encounter for screening mammogram for malignant neoplasm of breast: Secondary | ICD-10-CM | POA: Diagnosis not present

## 2016-06-29 ENCOUNTER — Ambulatory Visit (INDEPENDENT_AMBULATORY_CARE_PROVIDER_SITE_OTHER): Payer: PPO | Admitting: Pulmonary Disease

## 2016-06-29 ENCOUNTER — Encounter: Payer: Self-pay | Admitting: Pulmonary Disease

## 2016-06-29 VITALS — BP 120/70 | HR 61 | Temp 99.0°F | Ht 64.5 in | Wt 163.4 lb

## 2016-06-29 DIAGNOSIS — M150 Primary generalized (osteo)arthritis: Secondary | ICD-10-CM | POA: Diagnosis not present

## 2016-06-29 DIAGNOSIS — E7800 Pure hypercholesterolemia, unspecified: Secondary | ICD-10-CM

## 2016-06-29 DIAGNOSIS — F341 Dysthymic disorder: Secondary | ICD-10-CM

## 2016-06-29 DIAGNOSIS — E042 Nontoxic multinodular goiter: Secondary | ICD-10-CM

## 2016-06-29 DIAGNOSIS — M8589 Other specified disorders of bone density and structure, multiple sites: Secondary | ICD-10-CM | POA: Diagnosis not present

## 2016-06-29 DIAGNOSIS — E785 Hyperlipidemia, unspecified: Secondary | ICD-10-CM

## 2016-06-29 DIAGNOSIS — M810 Age-related osteoporosis without current pathological fracture: Secondary | ICD-10-CM | POA: Diagnosis not present

## 2016-06-29 DIAGNOSIS — M159 Polyosteoarthritis, unspecified: Secondary | ICD-10-CM

## 2016-06-29 DIAGNOSIS — M545 Low back pain, unspecified: Secondary | ICD-10-CM

## 2016-06-29 MED ORDER — ALPRAZOLAM 0.5 MG PO TABS: ORAL_TABLET | ORAL | 5 refills | Status: DC

## 2016-06-29 MED ORDER — BACLOFEN 10 MG PO TABS: 10.0000 mg | ORAL_TABLET | Freq: Every day | ORAL | 6 refills | Status: DC

## 2016-06-29 MED ORDER — EZETIMIBE 10 MG PO TABS: 10.0000 mg | ORAL_TABLET | Freq: Every day | ORAL | 6 refills | Status: DC

## 2016-06-29 MED ORDER — ACYCLOVIR 400 MG PO TABS: 400.0000 mg | ORAL_TABLET | Freq: Three times a day (TID) | ORAL | 2 refills | Status: DC

## 2016-06-29 MED ORDER — FLUOXETINE HCL 40 MG PO CAPS: 40.0000 mg | ORAL_CAPSULE | Freq: Every day | ORAL | 6 refills | Status: DC

## 2016-06-29 NOTE — Patient Instructions (Signed)
Today we updated your med list in our EPIC system...    Continue your current medications the same...  Please return to our lab in the AM for your FASTING blood work...    We will contact you w/ the results when available...   We gave you a low sodium diet sheet...  You can get your calcium fro your diet...  Add-in a women's formula multivit daily...    We will check your Vit D level to see if you need a supplement...  Continue your walking etc (weight bearing exercises)...  Call for any questions...  Let's plan a follow up visit in 32mo to 1 yr, sooner if needed for problems.Marland KitchenMarland Kitchen
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
## 2016-06-30 ENCOUNTER — Other Ambulatory Visit (INDEPENDENT_AMBULATORY_CARE_PROVIDER_SITE_OTHER): Payer: PPO

## 2016-06-30 ENCOUNTER — Other Ambulatory Visit: Payer: PPO

## 2016-06-30 DIAGNOSIS — M810 Age-related osteoporosis without current pathological fracture: Secondary | ICD-10-CM

## 2016-06-30 DIAGNOSIS — R7989 Other specified abnormal findings of blood chemistry: Secondary | ICD-10-CM | POA: Diagnosis not present

## 2016-06-30 DIAGNOSIS — E7800 Pure hypercholesterolemia, unspecified: Secondary | ICD-10-CM

## 2016-06-30 LAB — CBC WITH DIFFERENTIAL/PLATELET
BASOS ABS: 0.1 10*3/uL (ref 0.0–0.1)
Basophils Relative: 1 % (ref 0.0–3.0)
Eosinophils Absolute: 0.2 10*3/uL (ref 0.0–0.7)
Eosinophils Relative: 2.4 % (ref 0.0–5.0)
HEMATOCRIT: 41.5 % (ref 36.0–46.0)
Hemoglobin: 14.2 g/dL (ref 12.0–15.0)
LYMPHS PCT: 34.2 % (ref 12.0–46.0)
Lymphs Abs: 2.3 10*3/uL (ref 0.7–4.0)
MCHC: 34.3 g/dL (ref 30.0–36.0)
MCV: 83.2 fl (ref 78.0–100.0)
MONOS PCT: 6.2 % (ref 3.0–12.0)
Monocytes Absolute: 0.4 10*3/uL (ref 0.1–1.0)
Neutro Abs: 3.7 10*3/uL (ref 1.4–7.7)
Neutrophils Relative %: 56.2 % (ref 43.0–77.0)
Platelets: 239 10*3/uL (ref 150.0–400.0)
RBC: 4.99 Mil/uL (ref 3.87–5.11)
RDW: 14.1 % (ref 11.5–15.5)
WBC: 6.6 10*3/uL (ref 4.0–10.5)

## 2016-06-30 LAB — COMPREHENSIVE METABOLIC PANEL
ALT: 20 U/L (ref 0–35)
AST: 20 U/L (ref 0–37)
Albumin: 4 g/dL (ref 3.5–5.2)
Alkaline Phosphatase: 78 U/L (ref 39–117)
BILIRUBIN TOTAL: 0.7 mg/dL (ref 0.2–1.2)
BUN: 12 mg/dL (ref 6–23)
CALCIUM: 9.3 mg/dL (ref 8.4–10.5)
CO2: 28 meq/L (ref 19–32)
CREATININE: 0.86 mg/dL (ref 0.40–1.20)
Chloride: 104 mEq/L (ref 96–112)
GFR: 68.98 mL/min (ref >60.00)
GLUCOSE: 93 mg/dL (ref 70–99)
Potassium: 4.1 mEq/L (ref 3.5–5.1)
Sodium: 139 mEq/L (ref 135–145)
Total Protein: 6.6 g/dL (ref 6.0–8.3)

## 2016-06-30 LAB — LIPID PANEL
Cholesterol: 168 mg/dL (ref 0–200)
HDL: 36.1 mg/dL — ABNORMAL LOW (ref >39.00)
NONHDL: 131.93
Total CHOL/HDL Ratio: 5
Triglycerides: 217 mg/dL — ABNORMAL HIGH (ref 0.0–149.0)
VLDL: 43.4 mg/dL — ABNORMAL HIGH (ref 0.0–40.0)

## 2016-06-30 LAB — VITAMIN D 25 HYDROXY (VIT D DEFICIENCY, FRACTURES): VITD: 44.39 ng/mL (ref 30.00–100.00)

## 2016-06-30 LAB — TSH: TSH: 1.31 u[IU]/mL (ref 0.35–4.50)

## 2016-06-30 LAB — LDL CHOLESTEROL, DIRECT: LDL DIRECT: 96 mg/dL

## 2016-09-01 DIAGNOSIS — D485 Neoplasm of uncertain behavior of skin: Secondary | ICD-10-CM | POA: Diagnosis not present

## 2016-09-01 DIAGNOSIS — D2211 Melanocytic nevi of right eyelid, including canthus: Secondary | ICD-10-CM | POA: Diagnosis not present

## 2016-09-01 DIAGNOSIS — B029 Zoster without complications: Secondary | ICD-10-CM | POA: Diagnosis not present

## 2016-09-16 ENCOUNTER — Telehealth: Payer: Self-pay | Admitting: Pulmonary Disease

## 2016-09-16 MED ORDER — METHYLPREDNISOLONE 4 MG PO TBPK: ORAL_TABLET | ORAL | 0 refills | Status: DC

## 2016-09-16 NOTE — Telephone Encounter (Signed)
Per SN---  Medrol dosepak #1  Take as directed This has been sent to the pharmacy and the pt is aware .

## 2016-09-16 NOTE — Telephone Encounter (Signed)
Spoke with pt. States that she was diagnosed with the shingles. Pt was treated by Dr. Nevada Crane (dermatology) for this. She is now having issues with fatigue. Pt thinks this is coming from having shingles. Would like to have SN's recommendations.  SN - please advise. Thanks.  Allergies  Allergen Reactions  . Other Other (See Comments)    hallucinations   . Procaine Other (See Comments)    REACTION:  Lowers blood pressure, "makes me loopy, can't move or talk"

## 2016-10-07 ENCOUNTER — Encounter: Payer: Self-pay | Admitting: Family Medicine

## 2016-10-07 ENCOUNTER — Ambulatory Visit
Admission: RE | Admit: 2016-10-07 | Discharge: 2016-10-07 | Disposition: A | Discharge status: Home / Self Care | Payer: PPO | Source: Ambulatory Visit | Attending: Sports Medicine | Admitting: Sports Medicine

## 2016-10-07 ENCOUNTER — Ambulatory Visit (INDEPENDENT_AMBULATORY_CARE_PROVIDER_SITE_OTHER): Payer: PPO | Admitting: Family Medicine

## 2016-10-07 ENCOUNTER — Other Ambulatory Visit: Payer: Self-pay | Admitting: Family Medicine

## 2016-10-07 VITALS — BP 128/86 | Ht 65.5 in | Wt 165.0 lb

## 2016-10-07 DIAGNOSIS — M7989 Other specified soft tissue disorders: Secondary | ICD-10-CM | POA: Diagnosis not present

## 2016-10-07 DIAGNOSIS — M79644 Pain in right finger(s): Secondary | ICD-10-CM

## 2016-10-07 MED ORDER — MELOXICAM 15 MG PO TABS: 15.0000 mg | ORAL_TABLET | Freq: Every day | ORAL | 1 refills | Status: DC

## 2016-10-08 DIAGNOSIS — M79644 Pain in right finger(s): Secondary | ICD-10-CM | POA: Insufficient documentation

## 2016-10-08 NOTE — Assessment & Plan Note (Signed)
Most likely she has a strain of the ligaments around the PIP joint of the 4th digit on the right hand. The 3rd appears normal. No malrotation. Will evaluate for fracture  - x-rays of right hand.  - will buddy tape.  - will call with results.

## 2016-10-08 NOTE — Progress Notes (Signed)
  Carly Rhodes - 71 y.o. female MRN [id number]  Date of birth: 04-08-1946  SUBJECTIVE:  Including CC & ROS.   Carly Rhodes is a 71 yo F that is presenting with 3rd and 4th digit pain of her right hand. She was walking around last night and jammed her finger against the door jam. She went to bed last night and woke up with pain and swelling of her fingers this morning. She denies any altered sensation. She has trouble with moving her 4th digit.   ROS: No unexpected weight loss, fever, chills, instability, muscle pain, numbness/tingling, redness, otherwise see HPI    HISTORY: Past Medical, Surgical, Social, and Family History Reviewed & Updated per EMR.   Pertinent Historical Findings include: PMSHx -  OA, osteopenia, hysterectomy  PSHx -  Never smoker   DATA REVIEWED: None to review   PHYSICAL EXAM:  VS: BP:128/86  HR: bpm  TEMP: ( )  RESP:   HT:5' 5.5" (166.4 cm)   WT:165 lb (74.8 kg)  BMI:27.1 PHYSICAL EXAM: Gen: NAD, alert, cooperative with exam, well-appearing HEENT: clear conjunctiva, EOMI CV:  no edema, capillary refill brisk,  Resp: non-labored, normal speech Skin: no rashes, normal turgor  Neuro: no gross deficits.  Psych:  alert and oriented Right hand:  Obvious swelling of the PIP joint of 4th digit  Pain with palpation of PIP of 4th digit  Limited flexion and extension of DIP and PIP but intact  No ecchymosis  No TTP of metacarpals.  No malalignment or malrotation  Neurovascularly intact    ASSESSMENT & PLAN:   Pain of finger of right hand Most likely she has a strain of the ligaments around the PIP joint of the 4th digit on the right hand. The 3rd appears normal. No malrotation. Will evaluate for fracture  - x-rays of right hand.  - will buddy tape.  - will call with results.

## 2016-10-28 ENCOUNTER — Ambulatory Visit (INDEPENDENT_AMBULATORY_CARE_PROVIDER_SITE_OTHER): Payer: PPO | Admitting: Pulmonary Disease

## 2016-10-28 ENCOUNTER — Encounter: Payer: Self-pay | Admitting: Pulmonary Disease

## 2016-10-28 DIAGNOSIS — J069 Acute upper respiratory infection, unspecified: Secondary | ICD-10-CM | POA: Diagnosis not present

## 2016-10-28 MED ORDER — AZITHROMYCIN 250 MG PO TABS: ORAL_TABLET | ORAL | 2 refills | Status: DC

## 2016-10-28 NOTE — Patient Instructions (Addendum)
Today we updated your med list in our EPIC system...    Continue your current medications the same...  We wrote a new prescription for a Zithromax ZPak to take as directed for the upper resp tract infection...  Rest at home, drink plenty of fluids, you may use OTC MUCINEX, DELSYM, & TYLENOL as well...  Call for any questions...  Plan to keep your May 2018 follow up appt as previously scheduled.Marland KitchenMarland Kitchen
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
## 2016-12-28 ENCOUNTER — Ambulatory Visit: Payer: PPO | Admitting: Pulmonary Disease

## 2016-12-29 ENCOUNTER — Ambulatory Visit (INDEPENDENT_AMBULATORY_CARE_PROVIDER_SITE_OTHER): Payer: PPO | Admitting: Pulmonary Disease

## 2016-12-29 ENCOUNTER — Encounter: Payer: Self-pay | Admitting: Pulmonary Disease

## 2016-12-29 VITALS — BP 120/62 | HR 75 | Temp 97.3°F | Ht 64.5 in | Wt 171.0 lb

## 2016-12-29 DIAGNOSIS — E042 Nontoxic multinodular goiter: Secondary | ICD-10-CM

## 2016-12-29 DIAGNOSIS — M545 Low back pain, unspecified: Secondary | ICD-10-CM

## 2016-12-29 DIAGNOSIS — E785 Hyperlipidemia, unspecified: Secondary | ICD-10-CM | POA: Diagnosis not present

## 2016-12-29 DIAGNOSIS — G8929 Other chronic pain: Secondary | ICD-10-CM

## 2016-12-29 DIAGNOSIS — F341 Dysthymic disorder: Secondary | ICD-10-CM

## 2016-12-29 DIAGNOSIS — M159 Polyosteoarthritis, unspecified: Secondary | ICD-10-CM

## 2016-12-29 DIAGNOSIS — M8580 Other specified disorders of bone density and structure, unspecified site: Secondary | ICD-10-CM

## 2016-12-29 DIAGNOSIS — M150 Primary generalized (osteo)arthritis: Secondary | ICD-10-CM | POA: Diagnosis not present

## 2016-12-29 NOTE — Patient Instructions (Signed)
Today we updated your med list in our EPIC system...    Continue your current medications the same...  We wrote a prescription for the new SHINGLES vaccine -  SHINGRIX (one shot now & 2nd shot in about 3+months)  Keep up the good work w/ diet & exercise...  Call for any questions...  Let's plan a follow up visit in 76mo, sooner if needed for problems.Marland KitchenMarland Kitchen

## 2016-12-29 NOTE — Progress Notes (Signed)
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 mouth daily.   AZITHROMYCIN (ZITHROMAX) 250 MG TABLET    Take as directed   B COMPLEX VITAMINS TABLET    Take 1 tablet by mouth daily.   BACLOFEN (LIORESAL) 10 MG TABLET    Take 1 tablet (10 mg total) by mouth at bedtime.   EZETIMIBE (ZETIA) 10 MG TABLET    Take 1 tablet (10 mg total) by mouth daily.   FLUOXETINE (PROZAC) 40 MG CAPSULE    Take 1 capsule (40 mg total) by mouth daily.   MELOXICAM (MOBIC) 15 MG TABLET    Take 1 tablet (15 mg total) by mouth daily.   MULTIPLE VITAMINS-MINERALS (ALIVE WOMENS ENERGY) TABS    Take 1 tablet by mouth daily.  Modified Medications   No medications on file  Discontinued Medications   No medications on file

## 2016-12-30 ENCOUNTER — Encounter: Payer: Self-pay | Admitting: Gynecology

## 2017-01-24 ENCOUNTER — Other Ambulatory Visit: Payer: Self-pay | Admitting: Pulmonary Disease

## 2017-01-24 DIAGNOSIS — F341 Dysthymic disorder: Secondary | ICD-10-CM

## 2017-01-24 DIAGNOSIS — M545 Low back pain, unspecified: Secondary | ICD-10-CM

## 2017-01-25 NOTE — Telephone Encounter (Signed)
Please advise if okay to refill alprazolam 0.5 mg  Pt's last ov was 12/29/16 and no appt pending  Last given rx on 06/29/16 # 90 with 5 refills  Thanks

## 2017-02-26 ENCOUNTER — Other Ambulatory Visit: Payer: Self-pay | Admitting: Pulmonary Disease

## 2017-04-05 ENCOUNTER — Other Ambulatory Visit: Payer: Self-pay | Admitting: Pulmonary Disease

## 2017-04-05 ENCOUNTER — Telehealth: Payer: Self-pay | Admitting: Pulmonary Disease

## 2017-04-05 NOTE — Telephone Encounter (Signed)
Attempted to contact pt. No answer, no option to leave a message. Will try back.  

## 2017-04-06 MED ORDER — MELOXICAM 15 MG PO TABS: 15.0000 mg | ORAL_TABLET | Freq: Every day | ORAL | 1 refills | Status: DC

## 2017-04-06 NOTE — Telephone Encounter (Signed)
Called and spoke with pt and she is aware of refill that has been sent to the pharmacy per her request. Nothing further is needed.

## 2017-04-08 ENCOUNTER — Other Ambulatory Visit (INDEPENDENT_AMBULATORY_CARE_PROVIDER_SITE_OTHER): Payer: PPO

## 2017-04-08 ENCOUNTER — Ambulatory Visit (INDEPENDENT_AMBULATORY_CARE_PROVIDER_SITE_OTHER): Payer: PPO | Admitting: Pulmonary Disease

## 2017-04-08 ENCOUNTER — Encounter: Payer: Self-pay | Admitting: Pulmonary Disease

## 2017-04-08 VITALS — BP 120/82 | HR 76 | Temp 98.0°F | Wt 171.2 lb

## 2017-04-08 DIAGNOSIS — F341 Dysthymic disorder: Secondary | ICD-10-CM

## 2017-04-08 DIAGNOSIS — R5383 Other fatigue: Secondary | ICD-10-CM | POA: Diagnosis not present

## 2017-04-08 DIAGNOSIS — M150 Primary generalized (osteo)arthritis: Secondary | ICD-10-CM | POA: Diagnosis not present

## 2017-04-08 DIAGNOSIS — E785 Hyperlipidemia, unspecified: Secondary | ICD-10-CM

## 2017-04-08 DIAGNOSIS — E042 Nontoxic multinodular goiter: Secondary | ICD-10-CM

## 2017-04-08 DIAGNOSIS — M8580 Other specified disorders of bone density and structure, unspecified site: Secondary | ICD-10-CM

## 2017-04-08 DIAGNOSIS — M159 Polyosteoarthritis, unspecified: Secondary | ICD-10-CM

## 2017-04-08 DIAGNOSIS — E538 Deficiency of other specified B group vitamins: Secondary | ICD-10-CM

## 2017-04-08 LAB — COMPREHENSIVE METABOLIC PANEL
ALBUMIN: 4.4 g/dL (ref 3.5–5.2)
ALK PHOS: 82 U/L (ref 39–117)
ALT: 20 U/L (ref 0–35)
AST: 23 U/L (ref 0–37)
BUN: 21 mg/dL (ref 6–23)
CHLORIDE: 106 meq/L (ref 96–112)
CO2: 30 mEq/L (ref 19–32)
CREATININE: 0.85 mg/dL (ref 0.40–1.20)
Calcium: 10.1 mg/dL (ref 8.4–10.5)
GFR: 69.77 mL/min (ref >60.00)
Glucose, Bld: 104 mg/dL — ABNORMAL HIGH (ref 70–99)
Potassium: 4.1 mEq/L (ref 3.5–5.1)
Sodium: 141 mEq/L (ref 135–145)
Total Bilirubin: 0.8 mg/dL (ref 0.2–1.2)
Total Protein: 7.3 g/dL (ref 6.0–8.3)

## 2017-04-08 LAB — CBC WITH DIFFERENTIAL/PLATELET
BASOS PCT: 1.3 % (ref 0.0–3.0)
Basophils Absolute: 0.1 10*3/uL (ref 0.0–0.1)
EOS PCT: 2.3 % (ref 0.0–5.0)
Eosinophils Absolute: 0.2 10*3/uL (ref 0.0–0.7)
HEMATOCRIT: 42.4 % (ref 36.0–46.0)
HEMOGLOBIN: 14.3 g/dL (ref 12.0–15.0)
LYMPHS PCT: 39.3 % (ref 12.0–46.0)
Lymphs Abs: 2.7 10*3/uL (ref 0.7–4.0)
MCHC: 33.6 g/dL (ref 30.0–36.0)
MCV: 83.2 fl (ref 78.0–100.0)
MONO ABS: 0.4 10*3/uL (ref 0.1–1.0)
MONOS PCT: 6.3 % (ref 3.0–12.0)
Neutro Abs: 3.5 10*3/uL (ref 1.4–7.7)
Neutrophils Relative %: 50.8 % (ref 43.0–77.0)
Platelets: 232 10*3/uL (ref 150.0–400.0)
RBC: 5.09 Mil/uL (ref 3.87–5.11)
RDW: 14.2 % (ref 11.5–15.5)
WBC: 6.8 10*3/uL (ref 4.0–10.5)

## 2017-04-08 LAB — TSH: TSH: 0.84 u[IU]/mL (ref 0.35–4.50)

## 2017-04-08 LAB — VITAMIN B12: VITAMIN B 12: 204 pg/mL — AB (ref 211–911)

## 2017-04-08 NOTE — Progress Notes (Signed)
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 diarrhea, constipation, change in bowel habits, abdominal pain, melena, hematochezia, jaundice, indigestion/heartburn, dysphagia, odynophagia, dysuria, hematuria, urinary frequency, urinary hesitancy, nocturia, incontinence, joint pain, joint swelling, muscle cramps, muscle weakness, sciatica, restless legs, leg pain at night, leg pain with exertion, rash, itching, dryness, suspicious lesions, paralysis, paresthesias, seizures, tremors, vertigo, transient blindness, frequent falls, frequent headaches, difficulty walking, anxiety, memory loss, confusion, cold intolerance, heat intolerance, polydipsia, polyphagia, polyuria, unusual weight change, abnormal bruising, bleeding, enlarged lymph nodes, urticaria, allergic rash, and recurrent infections.     Objective:   Physical Exam     WD, WN, 71 y/o WF in NAD... GENERAL:  Alert & oriented; pleasant & cooperative... HEENT:  [place]/AT, EOM-full, PERRLA, EACs-clear, TMs-normal, NOSE-sl pale, THROAT- sl erythema w/o exud... NECK:  Supple w/ fairROM; no JVD; normal carotid impulses w/o bruits;  +thyromegaly w/ coarse bilat nodularity; no lymphadenopathy. CHEST:  Coarse BS w/ no wheezes, rales, or signs of consolidation. HEART:  Regular Rhythm; without murmurs/ rubs/ or gallops. ABDOMEN:  Soft & nontender; incr bowel sounds; no organomegaly or masses detected. EXT: without deformities or arthritic changes; no varicose veins/ venous insuffic/ or edema. NEURO:  no focal neuro deficits... DERM: no skin lesions or rash identified...  RADIOLOGY DATA:  Reviewed in the EPIC EMR & discussed w/ the patient...  LABORATORY DATA:  Reviewed in the EPIC EMR & discussed w/ the patient...   Assessment:      B12 Deficiency>  She is delighted to see that her B12 level  is sl low as she wants B12 shots and knows that this will give her more energy... REC to start B12 1059mg shot monthly...   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  Thyroid>  Thyroid function is wnl, nodules are palp & Sonar 12/13 showed most nodules stable in size but dom nodule left lower pole is NEW & subseq left thyroid lobectomy surg by DrByers 1/14 (see above)...  Ortho- LBP, wrist, shoulder> she is managed by DrFields & is improved overall...  [facility] [facility] Al[facility] 715mwk but pt stopped on her own using calcium, MVI, VitD; f/u BMD 10/14 showed lowest Tscore -2.1 & she wants to continue Fosamax70, calcium, MVI, VitD (level = 62)... BMD 11/16 IMPROVED & Alendronate stopped for 2y97yrneymoon period...  Anxiety> she is stable on Prozac40 & Xanax 0.5mg65m needed (taking one qhs), she wants to continue same, requesting refills- ok...  Left ear Otalgia 10/16>  RESOLVED w/ auralgan- no sign of shingles & she is relieved...     Plan:     Patient's Medications  New Prescriptions   No medications on file  Previous Medications   ACYCLOVIR (ZOVIRAX) 400 MG TABLET    Take 1 tablet (400 mg total) by mouth 3 (three) times daily. As directed by physician   ALPRAZOLAM (XANAX) 0.5 MG TABLET    TAKE ONE-HALF TO ONE TABLET BY MOUTH THREE TIMES DAILY AS NEEDED   ASPIRIN 325 MG TABLET    Take 325 mg by mouth daily.   B COMPLEX VITAMINS TABLET    Take 1 tablet by mouth daily.   BACLOFEN (LIORESAL) 10 MG TABLET    TAKE ONE TABLET BY MOUTH AT BEDTIME   EZETIMIBE (ZETIA) 10 MG TABLET    TAKE ONE TABLET BY MOUTH ONCE DAILY   FLUOXETINE (PROZAC) 40 MG CAPSULE    Take 1 capsule (40 mg total) by mouth daily.   MULTIPLE VITAMINS-MINERALS (ALIVE WOMENS ENERGY) TABS    Take 1 tablet by mouth daily.  Modified Medications   No medications on file  Discontinued Medications   AZITHROMYCIN (ZITHROMAX) 250 MG TABLET    Take as directed   MELOXICAM (MOBIC) 15 MG TABLET    Take 1 tablet (15 mg total) by mouth daily.

## 2017-04-08 NOTE — Patient Instructions (Addendum)
Today we updated your med list in our EPIC system...    Continue your current medications the same...  Today we checked your follow up blood work to see if we could find an explanation for your fatigue...    We will contact you w/ the results when available...   Let me know the name of the GYN-surgeon you want [place] to refer you to...  Continue a good low carb, low fat diet and stay as active as possible...  Call for any questions or if we can be of service in any way.Marland KitchenMarland Kitchen

## 2017-04-09 ENCOUNTER — Telehealth: Payer: Self-pay | Admitting: Pulmonary Disease

## 2017-04-09 DIAGNOSIS — E538 Deficiency of other specified B group vitamins: Secondary | ICD-10-CM | POA: Insufficient documentation

## 2017-04-09 NOTE — Telephone Encounter (Signed)
Called and spoke with pt and she is aware of appt on Monday at 10 to come in and get her first b12 and this will be ongoing every month.  Pt is aware of SN recs.  Nothing further is needed.

## 2017-04-12 ENCOUNTER — Telehealth: Payer: Self-pay | Admitting: Pulmonary Disease

## 2017-04-12 ENCOUNTER — Ambulatory Visit (INDEPENDENT_AMBULATORY_CARE_PROVIDER_SITE_OTHER): Payer: PPO

## 2017-04-12 DIAGNOSIS — E538 Deficiency of other specified B group vitamins: Secondary | ICD-10-CM

## 2017-04-12 DIAGNOSIS — N8110 Cystocele, unspecified: Secondary | ICD-10-CM

## 2017-04-12 NOTE — Telephone Encounter (Signed)
Patient stated she also need a referral to Dr. Katrine Coho at [facility] [phone].

## 2017-04-12 NOTE — Telephone Encounter (Signed)
I skyped Carly Rhodes around 8:40 and asked her if she would ask SN if Carly Rhodes needed to wait since this is her first B-12 shot. Per SN wait 30 mins.. I called pt. To let her know she would need to wait 30 mins.. She understood and said she would wait. She also needed to ask SN about a med. they talked about. I told her I would transfer her so she could leave a message. She said she would leave the message while she was waiting. Nothing further needed.

## 2017-04-12 NOTE — Telephone Encounter (Signed)
I had just called her a little before 9:00 to let her know SN wanted her to wait 30 mins.. She forgot momentarily she had something else to do. (apparently) I called her back and told her to come on in. (around 9:30) Busy morning.Nothing further needed.

## 2017-04-12 NOTE — Telephone Encounter (Signed)
Attempted to contact pt. No answer, no option to leave a message. Will try back.  

## 2017-04-13 MED ORDER — CYANOCOBALAMIN 1000 MCG/ML IJ SOLN
1000.0000 ug | Freq: Once | INTRAMUSCULAR | Status: AC
  Administered 2017-04-12: 1000 ug via INTRAMUSCULAR

## 2017-04-13 NOTE — Telephone Encounter (Signed)
ATC x2  

## 2017-04-14 MED ORDER — ESCITALOPRAM OXALATE 10 MG PO TABS: 10.0000 mg | ORAL_TABLET | Freq: Every day | ORAL | 3 refills | Status: DC

## 2017-04-14 NOTE — Telephone Encounter (Signed)
Per SN---   She didn't want to take anything for depression when she was here due to reading the side effects of those types of medications.  If she wants to take something then she can try the generic lexapro 10 mg  1 daily.  #30 with 3 refills.   Ok to refer her to Dr. Katrine Coho.  Thanks

## 2017-04-14 NOTE — Telephone Encounter (Signed)
Spoke with pt. She is aware that we will make this referral and send in her prescription. Nothing further was needed.

## 2017-04-14 NOTE — Telephone Encounter (Signed)
Called and spoke with pt. Pt states during her OV with JN on 04/08/17 JN mentioned prescribing pt something for depression. Pt states Rx was not sent to pharmacy.  Pt is also requesting a referral to Dr. Katrine Coho at [facility].  Preferred pharmacy is [facility] on [address].   SN please advise. Thanks.

## 2017-04-15 ENCOUNTER — Telehealth: Payer: Self-pay | Admitting: Obstetrics and Gynecology

## 2017-04-15 NOTE — Telephone Encounter (Signed)
Called and left a message for patient to call back to schedule a new patient doctor referral for: Cystocele without uterine prolapse.

## 2017-05-10 ENCOUNTER — Other Ambulatory Visit: Payer: Self-pay | Admitting: Pulmonary Disease

## 2017-05-11 ENCOUNTER — Encounter: Payer: Self-pay | Admitting: Obstetrics and Gynecology

## 2017-05-11 ENCOUNTER — Ambulatory Visit (INDEPENDENT_AMBULATORY_CARE_PROVIDER_SITE_OTHER): Payer: PPO | Admitting: Obstetrics and Gynecology

## 2017-05-11 VITALS — BP 120/70 | HR 84 | Resp 14 | Ht 64.25 in | Wt 171.0 lb

## 2017-05-11 DIAGNOSIS — N993 Prolapse of vaginal vault after hysterectomy: Secondary | ICD-10-CM | POA: Diagnosis not present

## 2017-05-11 DIAGNOSIS — R159 Full incontinence of feces: Secondary | ICD-10-CM

## 2017-05-11 DIAGNOSIS — Z636 Dependent relative needing care at home: Secondary | ICD-10-CM | POA: Diagnosis not present

## 2017-05-11 NOTE — Progress Notes (Signed)
GYNECOLOGY  VISIT   HPI: 71 y.o.   Divorced  Caucasian  female   G2P2 with No LMP recorded. Patient has had a hysterectomy.   here for  Cystocele.  States she does not want surgery but wants to be cared for someone who can help if she needs surgery.  Saw Dr. Matilde Sprang 2 year ago for evaluation.  She is doing Tour manager.   Voiding well.  No leaking urine unless her bladder is really full. Some urgency to void.  DF - 2 - 3 hours.  NF - voids 2 times per night.  No enuresis.   No hx UTIs.  No pyelo or stones.   No constipation.  Can have loose BMs due to dietary fruits and grains.  Fecal incontinence 2 -3 times a week.  States metamucil makes it worse.   She does not feel prolapse or pressure. Is not sexually active.   Status post abdominal hysterectomy and bladder tack and suprapubic catheter - Dr. Dory Horn.  MMK or Burch procedure?  Husband is 20 yo, and she has CHF and defibrillator.  They are married for 2 years.  She does everything for him and feels stressed about this.  He fell today, too. Tried an antidepressant but did not tolerate it.  States her husband is not open to having help at their home.  GYNECOLOGIC HISTORY: No LMP recorded. Patient has had a hysterectomy. Contraception:  Hysterectomy -- ovaries remain Menopausal hormone therapy:  none Last mammogram:  06/25/16 BIRADS 1 negative/density b Last pap smear:  About 5-6 years ago per patient        OB History    Gravida Para Term Preterm AB Living   2 2       2    SAB TAB Ectopic Multiple Live Births                     Patient Active Problem List   Diagnosis Date Noted  . Vitamin B 12 deficiency 04/09/2017  . Fatigue 04/08/2017  . Acute upper respiratory infection 10/28/2016  . Pain of finger of right hand 10/08/2016  . Dyslipidemia 12/25/2015  . Osteoarthritis 12/25/2015  . Osteopenia 12/25/2015  . Cystocele with uterine prolapse 01/24/2015  . Effusion of left knee 05/10/2014   . Knee pain, left 11/21/2013  . Gallstones without obstruction of gallbladder 05/31/2013  . Tendinitis of right rotator cuff 02/16/2013  . Pain in joint, shoulder region 12/06/2012  . Wrist pain 04/26/2012  . Thumb pain 04/26/2012  . 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 NONTOXIC MULTINODULAR GOITER 07/05/2009  . LIPOMA OF OTHER SKIN AND SUBCUTANEOUS TISSUE 08/16/2007  . DYSTHYMIA 08/16/2007  . LOW BACK PAIN SYNDROME 08/16/2007    Past Medical History:  Diagnosis Date  . Anxiety   . Depression   . Family history of anesthesia complication    sister extreme nausea & vomiting  . 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 Osteoporosis   . Seasonal allergies     Past Surgical History:  Procedure Laterality Date  . ABDOMINAL HYSTERECTOMY  1984  . APPENDECTOMY  1984   at time of Hysterectomy  . HAMMER TOE SURGERY  1993   2nd toe left foot  . THYROID LOBECTOMY  09/15/2012   Procedure: THYROID LOBECTOMY;  Surgeon: Melissa Montane, MD;  Location: [facility];  Service: ENT;  Laterality: Left;  . THYROIDECTOMY, PARTIAL  09/15/2012   Dr Janace Hoard  . TONSILLECTOMY  1957    Current Outpatient Prescriptions  Medication Sig Dispense Refill  . acyclovir (ZOVIRAX) 400 MG tablet Take 1 tablet (400 mg total) by mouth 3 (three) times daily. As directed by physician 30 tablet 2  . ALPRAZolam (XANAX) 0.5 MG tablet TAKE ONE-HALF TO ONE TABLET BY MOUTH THREE TIMES DAILY AS NEEDED 90 tablet 5  . aspirin 325 MG tablet Take 325 mg by mouth daily.    Marland Kitchen b complex vitamins tablet Take 1 tablet by mouth daily.    . baclofen (LIORESAL) 10 MG tablet TAKE ONE TABLET BY MOUTH AT BEDTIME 30 tablet 6  . ezetimibe (ZETIA) 10 MG tablet TAKE ONE TABLET BY MOUTH ONCE DAILY 30 tablet 6  . MEGARED OMEGA-3 KRILL OIL PO Take by mouth daily.    . Multiple Vitamins-Minerals (ALIVE WOMENS ENERGY) TABS Take 1 tablet by mouth daily.    . vitamin B-12 (CYANOCOBALAMIN) 1000 MCG tablet Take 1,000 mcg by mouth daily.    Marland Kitchen escitalopram (LEXAPRO) 10 MG tablet  Take 1 tablet (10 mg total) by mouth daily. (Patient not taking: Reported on 05/11/2017) 30 tablet 3  . FLUoxetine (PROZAC) 40 MG capsule Take 1 capsule (40 mg total) by mouth daily. (Patient not taking: Reported on 04/08/2017) 30 capsule 6   No current facility-administered medications for this visit.      ALLERGIES: Other and Procaine  Family History  Problem Relation Age of Onset  . Stomach cancer Brother 18  . Colon cancer Paternal Grandmother 2  . Diabetes Father   . Hypertension Sister   . Heart attack Neg Hx     Social History   Social History  . Marital status: Divorced    Spouse name: N/A  . Number of children: N/A  . Years of education: N/A   Occupational History  . Not on file.   Social History Main Topics  . Smoking status: Never Smoker  . Smokeless tobacco: Never Used  . Alcohol use No  . Drug use: No  . Sexual activity: Not Currently    Birth control/ protection: Post-menopausal, Surgical   Other Topics Concern  . Not on file   Social History Narrative  . No narrative on file    ROS:  Pertinent items are noted in HPI.  PHYSICAL EXAMINATION:    BP 120/70 (BP Location: Right Arm, Patient Position: Sitting, Cuff Size: Normal)   Pulse 84   Resp 14   Ht 5' 4.25" (1.632 m)   Wt 171 lb (77.6 kg)   BMI 29.12 kg/m     General appearance: alert, cooperative and appears stated age Head: Normocephalic, without obvious abnormality, atraumatic Neck: no adenopathy, supple, symmetrical, trachea midline and thyroid normal to inspection and palpation Lungs: clear to auscultation bilaterally Heart: regular rate and rhythm Abdomen: soft, non-tender, no masses,  no organomegaly Extremities: extremities normal, atraumatic, no cyanosis or edema Skin: Skin color, texture, turgor normal. No rashes or lesions Lymph nodes: Cervical, supraclavicular, and axillary nodes normal. No abnormal inguinal nodes palpated Neurologic: Grossly normal  Pelvic: External  genitalia:  no lesions              Urethra:  normal appearing urethra with no masses, tenderness or lesions              Bartholins and Skenes: normal                 Vagina: normal appearing vagina with normal color and discharge, no lesions.  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  First degree rectocele.  Cervix: no lesions                Bimanual Exam:  Uterus:  Absent.              Adnexa: no mass, fullness, tenderness              Rectal exam: Yes.  .  Confirms.              Anus:  normal sphincter tone, no lesions  Chaperone was present for exam.  ASSESSMENT  Cystocele. Third degree.  Has vaginal vault prolapse. Fecal incontinence.  Situational stress.   PLAN  Prolapse discussed.   Options for care:  Observation, physical therapy, pessary, and surgery discussed.  She declines Metamucil for her fecal incontinence.  I mentioned Imodium if needed for bowel control prior to any special occasion. ACOG HO on prolapse and surgery for prolapse. She wishes to do observational management and will follow up yearly.  I recommend she contact her PCP back about her intolerance to medication.  I also strongly encouraged her to see what resources are available for care of her husband in their home.    An After Visit Summary was printed and given to the patient.  __45____ minutes face to face time of which over 50% was spent in counseling.

## 2017-05-13 ENCOUNTER — Ambulatory Visit (INDEPENDENT_AMBULATORY_CARE_PROVIDER_SITE_OTHER): Payer: PPO

## 2017-05-13 DIAGNOSIS — E538 Deficiency of other specified B group vitamins: Secondary | ICD-10-CM | POA: Diagnosis not present

## 2017-05-14 MED ORDER — CYANOCOBALAMIN 1000 MCG/ML IJ SOLN
1000.0000 ug | Freq: Once | INTRAMUSCULAR | Status: AC
  Administered 2017-05-13: 1000 ug via INTRAMUSCULAR

## 2017-05-20 ENCOUNTER — Encounter: Payer: Self-pay | Admitting: Pulmonary Disease

## 2017-05-20 ENCOUNTER — Ambulatory Visit (INDEPENDENT_AMBULATORY_CARE_PROVIDER_SITE_OTHER): Payer: PPO | Admitting: Pulmonary Disease

## 2017-05-20 VITALS — BP 120/80 | HR 70 | Temp 97.3°F | Wt 173.1 lb

## 2017-05-20 DIAGNOSIS — E538 Deficiency of other specified B group vitamins: Secondary | ICD-10-CM | POA: Diagnosis not present

## 2017-05-20 DIAGNOSIS — E785 Hyperlipidemia, unspecified: Secondary | ICD-10-CM

## 2017-05-20 DIAGNOSIS — R5383 Other fatigue: Secondary | ICD-10-CM | POA: Diagnosis not present

## 2017-05-20 DIAGNOSIS — E042 Nontoxic multinodular goiter: Secondary | ICD-10-CM

## 2017-05-20 DIAGNOSIS — M8580 Other specified disorders of bone density and structure, unspecified site: Secondary | ICD-10-CM | POA: Diagnosis not present

## 2017-05-20 DIAGNOSIS — M150 Primary generalized (osteo)arthritis: Secondary | ICD-10-CM | POA: Diagnosis not present

## 2017-05-20 DIAGNOSIS — F341 Dysthymic disorder: Secondary | ICD-10-CM

## 2017-05-20 DIAGNOSIS — G4700 Insomnia, unspecified: Secondary | ICD-10-CM | POA: Insufficient documentation

## 2017-05-20 DIAGNOSIS — M159 Polyosteoarthritis, unspecified: Secondary | ICD-10-CM

## 2017-05-20 NOTE — Progress Notes (Signed)
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 Influenza Split 05/20/2011, 06/23/2012  . Influenza Whole 07/15/2009, 05/22/2010  . Influenza, High Dose Seasonal PF 06/17/2016  . Influenza,inj,Quad PF,6+ Mos 06/30/2013, 07/02/2014, 07/01/2015  . Pneumococcal Polysaccharide-23 05/31/2013  . Tdap 01/16/2015    Current Medications, Allergies, Past Medical History, Past Surgical History, Family History, and Social History were reviewed in [employer] record.   Review of Systems        The patient complains of recent URI symproms- cough/ congestion, sore throat + gas/bloating, back pain, stiffness, arthritis, depression.  The patient denies fever, chills, sweats, anorexia, fatigue, weakness, malaise, weight loss, sleep disorder, blurring, diplopia, eye irritation, eye discharge, vision loss, eye pain, photophobia, earache, ear discharge, tinnitus, decreased hearing, nasal congestion, nosebleeds, sore throat, hoarseness, chest pain, palpitations, syncope, dyspnea on exertion, orthopnea, PND, peripheral edema, cough,  dyspnea at rest, excessive sputum, hemoptysis, wheezing, pleurisy, nausea, vomiting, diarrhea, constipation, change in bowel habits, abdominal pain, melena, hematochezia, jaundice, indigestion/heartburn, dysphagia, odynophagia, dysuria, hematuria, urinary frequency, urinary hesitancy, nocturia, incontinence, joint pain, joint swelling, muscle cramps, muscle weakness, sciatica, restless legs,  leg pain at night, leg pain with exertion, rash, itching, dryness, suspicious lesions, paralysis, paresthesias, seizures, tremors, vertigo, transient blindness, frequent falls, frequent headaches, difficulty walking, anxiety, memory loss, confusion, cold intolerance, heat intolerance, polydipsia, polyphagia, polyuria, unusual weight change, abnormal bruising, bleeding, enlarged lymph nodes, urticaria, allergic rash, and recurrent infections.     Objective:   Physical Exam     WD, WN, 71 y/o WF in NAD... GENERAL:  Alert & oriented; pleasant & cooperative... HEENT:  [place]/AT, EOM-full, PERRLA, EACs-clear, TMs-normal, NOSE-sl pale, THROAT- sl erythema w/o exud... NECK:  Supple w/ fairROM; no JVD; normal carotid impulses w/o bruits; +thyromegaly w/ coarse bilat nodularity; no lymphadenopathy. CHEST:  Coarse BS w/ no wheezes, rales, or signs of consolidation. HEART:  Regular Rhythm; without murmurs/ rubs/ or gallops. ABDOMEN:  Soft & nontender; incr bowel sounds; no organomegaly or masses detected. EXT: without deformities or arthritic changes; no varicose veins/ venous insuffic/ or edema. NEURO:  no focal neuro deficits... DERM: no skin lesions or rash identified...  RADIOLOGY DATA:  Reviewed in the EPIC EMR & discussed w/ the patient...  LABORATORY DATA:  Reviewed in the EPIC EMR & discussed w/ the patient...   Assessment:      B12 Deficiency>  She is delighted to see that her B12 level is sl low as she wants B12 shots and knows that this will give her more energy... REC to start B12 1088mg shot monthly =>  f/u B12 level is >1500 on these shots.   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  Thyroid>  Thyroid function is wnl, nodules are palp & Sonar 12/13 showed most nodules stable in size but dom nodule left lower pole is NEW & subseq left thyroid lobectomy surg by DrByers 1/14 (see above)...  Ortho- LBP, wrist, shoulder> she is managed by DrFields & is improved overall...  Osteop> [facility] but pt stopped on her own using calcium, MVI, VitD; f/u BMD 10/14 showed lowest Tscore -2.1 & she wants to continue Fosamax70, calcium, MVI, VitD (level = 62)... BMD 11/16 IMPROVED & Alendronate stopped for 2y81yrneymoon period...  Anxiety/ Depression> she was stable on Prozac40 & Xanax 0.5mg72m needed (taking one qhs); then she stopped the Prozac after reading something about potential side effects- offered Lexapro or Psyche referral for other med considerations but she declines...  Left ear Otalgia 10/16> RESOLVED w/ auralgan- no sign of shingles & she is relieved...     Plan:     Patient's Medications  New Prescriptions   No medications on file  Previous Medications   ACYCLOVIR (ZOVIRAX) 400 MG TABLET    Take 1 tablet (400 mg total) by mouth 3 (three) times daily. As directed by physician   ALPRAZOLAM (XANAX) 0.5 MG TABLET    TAKE ONE-HALF TO ONE TABLET BY MOUTH THREE TIMES DAILY AS NEEDED   ASPIRIN 325 MG TABLET    Take 325 mg by mouth daily.   B COMPLEX VITAMINS TABLET    Take 1 tablet by mouth daily.   BACLOFEN (LIORESAL) 10 MG TABLET    TAKE ONE  TABLET BY MOUTH AT BEDTIME   CYANOCOBALAMIN (,VITAMIN B-12,) 1000 MCG/ML  INJECTION    Inject 1,000 mcg into the muscle every 30 (thirty) days.   EZETIMIBE (ZETIA) 10 MG TABLET    TAKE ONE TABLET BY MOUTH ONCE DAILY   MEGARED OMEGA-3 KRILL OIL PO    Take by mouth daily.   MULTIPLE VITAMINS-MINERALS (ALIVE WOMENS ENERGY) TABS    Take 1 tablet by mouth daily.  Modified Medications   No medications on file  Discontinued Medications   ESCITALOPRAM (LEXAPRO) 10 MG TABLET    Take 1 tablet (10 mg total) by mouth daily.   FLUOXETINE (PROZAC) 40 MG CAPSULE    Take 1 capsule (40 mg total) by mouth daily.   VITAMIN B-12 (CYANOCOBALAMIN) 1000 MCG TABLET    Take 1,000 mcg by mouth daily.

## 2017-06-14 ENCOUNTER — Ambulatory Visit: Payer: PPO

## 2017-06-14 ENCOUNTER — Ambulatory Visit (INDEPENDENT_AMBULATORY_CARE_PROVIDER_SITE_OTHER): Payer: PPO

## 2017-06-14 ENCOUNTER — Telehealth: Payer: Self-pay | Admitting: Pulmonary Disease

## 2017-06-14 DIAGNOSIS — Z23 Encounter for immunization: Secondary | ICD-10-CM

## 2017-06-14 DIAGNOSIS — E538 Deficiency of other specified B group vitamins: Secondary | ICD-10-CM

## 2017-06-14 NOTE — Telephone Encounter (Signed)
Notes recorded by Noralee Space, MD on 04/09/2017 at 10:43 AM EDT I called cell & left message to call back> Chems, CBC, Thyroid are all WNL... B12 level returns at the lower lim of norm & her options are:   1) B12 shots - give 1051mcg IM Qmo w/ rov recheck in 23mo...  2) oral B12 supplement - take OTC B12 10106mcg tab daily w/ rov in 72mo to confirm blood level is back to norm...  ATC pt, no answer. Left message for pt to call back. '

## 2017-06-15 ENCOUNTER — Ambulatory Visit: Payer: PPO

## 2017-06-15 DIAGNOSIS — Z23 Encounter for immunization: Secondary | ICD-10-CM

## 2017-06-15 DIAGNOSIS — E538 Deficiency of other specified B group vitamins: Secondary | ICD-10-CM | POA: Diagnosis not present

## 2017-06-15 MED ORDER — CYANOCOBALAMIN 1000 MCG/ML IJ SOLN
1000.0000 ug | Freq: Once | INTRAMUSCULAR | Status: AC
  Administered 2017-06-14: 1000 ug via INTRAMUSCULAR

## 2017-06-15 NOTE — Progress Notes (Signed)
Vitamin B12 injection documentation and charges entered by Maury Dus, RMA, based on injection sheet filled out by Alroy Bailiff per office protocol.

## 2017-06-16 NOTE — Telephone Encounter (Signed)
Attempted to contact pt. Received fast busy signal x2. Will try back.  

## 2017-06-18 NOTE — Telephone Encounter (Signed)
lmtcb x2 for pt. 

## 2017-06-21 ENCOUNTER — Other Ambulatory Visit (INDEPENDENT_AMBULATORY_CARE_PROVIDER_SITE_OTHER): Payer: PPO

## 2017-06-21 DIAGNOSIS — E538 Deficiency of other specified B group vitamins: Secondary | ICD-10-CM

## 2017-06-21 LAB — VITAMIN B12

## 2017-06-21 NOTE — Telephone Encounter (Signed)
Pt is aware of below message. B12 lab has been ordered.  Pt states she would like to proceed with B12 injection, even if she has to pay out of pocket.  Will route to Leigh to make aware.

## 2017-06-21 NOTE — Telephone Encounter (Signed)
Pt will not need to come in for repeat labs for her vit b 12  And will need to recheck in her 3 months.

## 2017-07-01 ENCOUNTER — Telehealth: Payer: Self-pay | Admitting: Obstetrics and Gynecology

## 2017-07-01 NOTE — Telephone Encounter (Signed)
Patient states she was not honest about her diahrrea problems when she saw Dr Quincy Simmonds.  She states Dr Quincy Simmonds could help her with her problem and tell her how she can use metamucil to help.   Would like to speak with Dr Quincy Simmonds directly if she could.

## 2017-07-01 NOTE — Telephone Encounter (Signed)
She can use Metamucil or Citrucel 1 - 2 tablespoons daily for fecal incontinence.  I would suggest she start with this treatment every other day and work up to daily use.

## 2017-07-01 NOTE — Telephone Encounter (Signed)
Spoke with patient. Patient states she discussed bowel problems with Dr. Quincy Simmonds at last [place] 05/11/17 and declined recommendations for Metamucil. Patient states she feels she may have been in denial at that time about the issue and did not want to admit there may be a problem. Patient would now like recommendations for metamucil for fecal incontinence. Advised patient would review with Dr. Quincy Simmonds and return call with recommendations, patient is agreeable.   Dr. Quincy Simmonds -please advise on Metamucil.

## 2017-07-02 NOTE — Telephone Encounter (Signed)
Spoke with patient, advised as seen below per Dr. Quincy Simmonds. Patient read back instructions, verbalizes understanding and is agreeable. Will close encounter.

## 2017-07-13 ENCOUNTER — Ambulatory Visit (INDEPENDENT_AMBULATORY_CARE_PROVIDER_SITE_OTHER): Payer: PPO

## 2017-07-13 DIAGNOSIS — E538 Deficiency of other specified B group vitamins: Secondary | ICD-10-CM | POA: Diagnosis not present

## 2017-07-16 MED ORDER — CYANOCOBALAMIN 1000 MCG/ML IJ SOLN
1000.0000 ug | Freq: Once | INTRAMUSCULAR | Status: AC
  Administered 2017-07-13: 1000 ug via INTRAMUSCULAR

## 2017-08-11 ENCOUNTER — Ambulatory Visit (INDEPENDENT_AMBULATORY_CARE_PROVIDER_SITE_OTHER): Payer: PPO

## 2017-08-11 DIAGNOSIS — E538 Deficiency of other specified B group vitamins: Secondary | ICD-10-CM

## 2017-08-12 DIAGNOSIS — E538 Deficiency of other specified B group vitamins: Secondary | ICD-10-CM

## 2017-08-12 MED ORDER — CYANOCOBALAMIN 1000 MCG/ML IJ SOLN
1000.0000 ug | Freq: Once | INTRAMUSCULAR | Status: AC
  Administered 2017-08-12: 1000 ug via INTRAMUSCULAR

## 2017-08-24 ENCOUNTER — Encounter: Payer: Self-pay | Admitting: Pulmonary Disease

## 2017-08-24 ENCOUNTER — Ambulatory Visit: Payer: PPO | Admitting: Emergency Medicine

## 2017-08-24 ENCOUNTER — Ambulatory Visit: Payer: PPO | Admitting: Pulmonary Disease

## 2017-08-24 VITALS — BP 116/70 | HR 82 | Temp 98.2°F | Ht 64.25 in | Wt 176.0 lb

## 2017-08-24 DIAGNOSIS — G4709 Other insomnia: Secondary | ICD-10-CM | POA: Diagnosis not present

## 2017-08-24 DIAGNOSIS — E538 Deficiency of other specified B group vitamins: Secondary | ICD-10-CM

## 2017-08-24 DIAGNOSIS — M150 Primary generalized (osteo)arthritis: Secondary | ICD-10-CM

## 2017-08-24 DIAGNOSIS — E042 Nontoxic multinodular goiter: Secondary | ICD-10-CM | POA: Diagnosis not present

## 2017-08-24 DIAGNOSIS — F341 Dysthymic disorder: Secondary | ICD-10-CM | POA: Diagnosis not present

## 2017-08-24 DIAGNOSIS — M159 Polyosteoarthritis, unspecified: Secondary | ICD-10-CM

## 2017-08-24 DIAGNOSIS — M8580 Other specified disorders of bone density and structure, unspecified site: Secondary | ICD-10-CM | POA: Diagnosis not present

## 2017-08-24 DIAGNOSIS — R5383 Other fatigue: Secondary | ICD-10-CM | POA: Diagnosis not present

## 2017-08-24 DIAGNOSIS — E785 Hyperlipidemia, unspecified: Secondary | ICD-10-CM

## 2017-08-24 NOTE — Progress Notes (Signed)
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 tablet by mouth daily.  . baclofen (LIORESAL) 10 MG tablet TAKE ONE TABLET BY MOUTH AT BEDTIME  . cyanocobalamin (,VITAMIN B-12,) 1000 MCG/ML injection Inject 1,000 mcg into the muscle every 30 (thirty) days.  Marland Kitchen MEGARED OMEGA-3 KRILL OIL PO Take by mouth daily.  . Multiple Vitamins-Minerals (ALIVE WOMENS ENERGY) TABS Take 1 tablet by mouth daily.  . [DISCONTINUED] ezetimibe (ZETIA) 10 MG tablet TAKE ONE TABLET BY MOUTH ONCE DAILY   No facility-administered encounter medications on file as of 08/24/2017.     Allergies  Allergen Reactions  . Other Other (See Comments)    hallucinations   . Procaine Other (See Comments)    REACTION:  Lowers blood pressure, "makes me loopy, can't move or talk"    Immunization History  Administered Date(s) Administered  . Influenza Split 05/20/2011, 06/23/2012  . Influenza Whole 07/15/2009, 05/22/2010  . Influenza, High Dose Seasonal PF 06/17/2016, 06/15/2017  . Influenza,inj,Quad PF,6+ Mos 06/30/2013, 07/02/2014, 07/01/2015  . Pneumococcal Polysaccharide-23 05/31/2013  . Tdap 01/16/2015    Current Medications, Allergies, Past Medical History, Past Surgical History, Family  History, and Social History  were reviewed in [employer] record.   Review of Systems        The patient complains of recent URI symproms- cough/ congestion, sore throat + gas/bloating, back pain, stiffness, arthritis, depression.  The patient denies fever, chills, sweats, anorexia, fatigue, weakness, malaise, weight loss, sleep disorder, blurring, diplopia, eye irritation, eye discharge, vision loss, eye pain, photophobia, earache, ear discharge, tinnitus, decreased hearing, nasal congestion, nosebleeds, sore throat, hoarseness, chest pain, palpitations, syncope, dyspnea on exertion, orthopnea, PND, peripheral edema, cough, dyspnea at rest, excessive sputum, hemoptysis, wheezing, pleurisy, nausea, vomiting, diarrhea, constipation, change in bowel habits, abdominal pain, melena, hematochezia, jaundice, indigestion/heartburn, dysphagia, odynophagia, dysuria, hematuria, urinary frequency, urinary hesitancy, nocturia, incontinence, joint pain, joint swelling, muscle cramps, muscle weakness, sciatica, restless legs, leg pain at night, leg pain with exertion, rash, itching, dryness, suspicious lesions, paralysis, paresthesias, seizures, tremors, vertigo, transient blindness, frequent falls, frequent headaches, difficulty walking, anxiety, memory loss, confusion, cold intolerance, heat intolerance, polydipsia, polyphagia, polyuria, unusual weight change, abnormal bruising, bleeding, enlarged lymph nodes, urticaria, allergic rash, and recurrent infections.     Objective:   Physical Exam     WD, WN, 72 y/o WF in NAD... GENERAL:  Alert & oriented; pleasant & cooperative... HEENT:  [place]/AT, EOM-full, PERRLA, EACs-clear, TMs-normal, NOSE-sl pale, THROAT- sl erythema w/o exud... NECK:  Supple w/ fairROM; no JVD; normal carotid impulses w/o bruits; +thyromegaly w/ coarse bilat nodularity; no lymphadenopathy. CHEST:  Coarse BS w/ no wheezes, rales, or signs of consolidation. HEART:  Regular Rhythm; without murmurs/ rubs/  or gallops. ABDOMEN:  Soft & nontender; incr bowel sounds; no organomegaly or masses detected. EXT: without deformities or arthritic changes; no varicose veins/ venous insuffic/ or edema. NEURO:  no focal neuro deficits... DERM: no skin lesions or rash identified...  RADIOLOGY DATA:  Reviewed in the EPIC EMR & discussed w/ the patient...  LABORATORY DATA:  Reviewed in the EPIC EMR & discussed w/ the patient...   Assessment:       08/24/17>  [place] is stable & working thru her stress & fatigue;  Rec to continue same meds, take some time for herself, & incr exercise...   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  Thyroid>  Thyroid function is wnl, nodules are palp & Sonar 12/13 showed most nodules stable in size but dom nodule left lower pole is NEW & subseq left thyroid lobectomy surg by DrByers 1/14 (see above)...  Ortho- LBP, wrist, shoulder> she is managed by DrFields & is improved overall...  Osteop> BMD 6/09 at [facility] -2.9 in Spine, and -2.1 in left [facility]; treated w/ Alendronate '70mg'$ /wk but pt stopped on her own using calcium, MVI, VitD; f/u BMD 10/14 showed lowest Tscore -2.1 & she wants to continue Fosamax70, calcium, MVI, VitD (level = 62)... BMD 11/16 IMPROVED & Alendronate stopped for 52yrhoneymoon period...  B12 Deficiency>  She is delighted to see that her B12 level is sl low as she wants B12 shots and knows that this will give her more energy... REC to start B12 10067m shot monthly => f/u B12 level is >1500 on these shots.  Anxiety/ Depression> she was  stable on Prozac40 & Xanax 0.'5mg'$  as needed (taking one qhs); then she stopped the Prozac after reading something about  potential side effects- offered Lexapro or Psyche referral for other med considerations but she declines...     Plan:       Medication List        Accurate as of 08/24/17  1:18 PM. Always use your most recent med list.          acyclovir 400 MG tablet Commonly known as:  ZOVIRAX Take 1 tablet (400 mg total) by mouth 3 (three) times daily. As directed by physician   ALIVE WOMENS ENERGY Tabs   ALPRAZolam 0.5 MG tablet Commonly known as:  XANAX TAKE ONE-HALF TO ONE TABLET BY MOUTH THREE TIMES DAILY AS NEEDED   aspirin 325 MG tablet   b complex vitamins tablet   baclofen 10 MG tablet Commonly known as:  LIORESAL TAKE ONE TABLET BY MOUTH AT BEDTIME   cyanocobalamin 1000 MCG/ML injection Commonly known as:  (VITAMIN B-12)   MEGARED OMEGA-3 KRILL OIL PO

## 2017-08-24 NOTE — Patient Instructions (Signed)
Today we updated your med list in our EPIC system...    Continue your current medications the same...  Call for any questions...  Let's plan a follow up visit in 6mo, sooner if needed for problems...   

## 2017-09-01 ENCOUNTER — Other Ambulatory Visit: Payer: Self-pay | Admitting: Pulmonary Disease

## 2017-09-01 DIAGNOSIS — F341 Dysthymic disorder: Secondary | ICD-10-CM

## 2017-09-01 DIAGNOSIS — M545 Low back pain, unspecified: Secondary | ICD-10-CM

## 2017-09-14 ENCOUNTER — Ambulatory Visit (INDEPENDENT_AMBULATORY_CARE_PROVIDER_SITE_OTHER): Payer: PPO

## 2017-09-14 DIAGNOSIS — E538 Deficiency of other specified B group vitamins: Secondary | ICD-10-CM | POA: Diagnosis not present

## 2017-09-15 MED ORDER — CYANOCOBALAMIN 1000 MCG/ML IJ SOLN
1000.0000 ug | Freq: Once | INTRAMUSCULAR | Status: AC
  Administered 2017-09-14: 1000 ug via INTRAMUSCULAR

## 2017-09-15 NOTE — Progress Notes (Signed)
Documentation of medication administration and charges of Vitamin B12 have been completed by Lindsay Lemons, CMA based on the Vitamin B12 documentation sheet completed by Tammy Scott.  

## 2017-09-21 ENCOUNTER — Ambulatory Visit: Payer: PPO | Admitting: Sports Medicine

## 2017-09-28 ENCOUNTER — Encounter: Payer: Self-pay | Admitting: Sports Medicine

## 2017-09-28 ENCOUNTER — Ambulatory Visit (INDEPENDENT_AMBULATORY_CARE_PROVIDER_SITE_OTHER): Payer: PPO | Admitting: Sports Medicine

## 2017-09-28 DIAGNOSIS — M76821 Posterior tibial tendinitis, right leg: Secondary | ICD-10-CM | POA: Diagnosis not present

## 2017-09-28 DIAGNOSIS — M76822 Posterior tibial tendinitis, left leg: Secondary | ICD-10-CM | POA: Insufficient documentation

## 2017-09-28 NOTE — Assessment & Plan Note (Signed)
This came after a sprain  Improving and I reassured her that what she was doing is working  Compression HEP Arch support  Reck if problems persist

## 2017-09-28 NOTE — Progress Notes (Signed)
RT foot pain  Patient turned ankle - eversion - 2 weeks ago Hard to walk Pain along medial arch Used ice Compression sock Both of these helped  Still painful and comes for check  Ros No swelling now Some shoes are painful unless she adds arch Pain radiates up inside of leg  PE Older F in NAD BP 110/80   Ht 5' 5.5" (1.664 m)   Wt 175 lb (79.4 kg)   BMI 28.68 kg/m   Ankle: RT No visible erythema or swelling. Range of motion is full in all directions. Strength is 5/5 in all directions except inverted plantar flexion is mildly weak Stable lateral and medial ligaments; squeeze test and kleiger test unremarkable; Talar dome nontender; No pain at base of 5th MT; No tenderness over cuboid; No tenderness over N spot or navicular prominence No tenderness on posterior aspects of lateral and medial malleolus No sign of peroneal tendon subluxations; Negative tarsal tunnel tinel's Able to walk 4 steps.  TTP along post. tib tendon This tracks under long arch  ULtrasound screen ; swelling along post. Tibial tendon is mild; extends down into arch area.  No tendon tear.  No  Swelling in ankle joint.

## 2017-09-28 NOTE — Patient Instructions (Signed)
Keep arch in your shoes  For cramps Mustard Dill pickle juice Regular strength gatorade  Exercises  1. Foot motion 2. Press down with foot 3. Gradually walk some on your toes  Compression is very helpful  This will gradually heal as it appears a strain and not a tear

## 2017-10-14 ENCOUNTER — Ambulatory Visit: Payer: PPO

## 2017-10-18 ENCOUNTER — Ambulatory Visit (INDEPENDENT_AMBULATORY_CARE_PROVIDER_SITE_OTHER): Payer: PPO

## 2017-10-18 DIAGNOSIS — E538 Deficiency of other specified B group vitamins: Secondary | ICD-10-CM

## 2017-10-19 ENCOUNTER — Ambulatory Visit
Admission: RE | Admit: 2017-10-19 | Discharge: 2017-10-19 | Disposition: A | Discharge status: Home / Self Care | Payer: PPO | Source: Ambulatory Visit | Attending: Sports Medicine | Admitting: Sports Medicine

## 2017-10-19 ENCOUNTER — Ambulatory Visit (INDEPENDENT_AMBULATORY_CARE_PROVIDER_SITE_OTHER): Payer: PPO | Admitting: Sports Medicine

## 2017-10-19 ENCOUNTER — Encounter: Payer: Self-pay | Admitting: Sports Medicine

## 2017-10-19 VITALS — BP 120/72 | Ht 65.5 in | Wt 175.0 lb

## 2017-10-19 DIAGNOSIS — M25561 Pain in right knee: Secondary | ICD-10-CM

## 2017-10-19 DIAGNOSIS — M1711 Unilateral primary osteoarthritis, right knee: Secondary | ICD-10-CM | POA: Diagnosis not present

## 2017-10-19 DIAGNOSIS — E538 Deficiency of other specified B group vitamins: Secondary | ICD-10-CM | POA: Diagnosis not present

## 2017-10-19 MED ORDER — CYANOCOBALAMIN 1000 MCG/ML IJ SOLN
1000.0000 ug | Freq: Once | INTRAMUSCULAR | Status: AC
  Administered 2017-10-19: 1000 ug via INTRAMUSCULAR

## 2017-10-19 MED ORDER — ACETAMINOPHEN-CODEINE #3 300-30 MG PO TABS: ORAL_TABLET | ORAL | 0 refills | Status: DC

## 2017-10-19 NOTE — Progress Notes (Signed)
Documentation of medication administration and charges of Vitamin B12 have been completed by Nate Perri, CMA based on the Vitamin B12 documentation sheet completed by Tammy Scott.  

## 2017-10-19 NOTE — Assessment & Plan Note (Signed)
-  Acute, improving.  Concern for retained body given point tenderness and acute nature of pain.  Baker's cyst unlikely to be causing current symptoms.  X-ray ordered to rule this out.  Dr. Micheline Chapman to call patient on her cell phone at [phone] to discuss results once x-ray performed. -Recommended wearing a compression sleeve to reduce swelling.  Patient fitted for this. -Tylenol #3 nightly as needed ordered for patient without refills, as she reports this has worked for pain for her in the past. -Offered steroid injection of the knee to help with inflammation.  However, patient declined at this time.  This seems reasonable since she is having improvement in pain already.  If pain worsens, reconsider knee injection.

## 2017-10-19 NOTE — Progress Notes (Addendum)
Carly Rhodes Progress Note  Subjective:  Carly Rhodes is a 72 y.o. female with history of recent right ankle sprain, previous left knee effusion and known osteoarthritis, and previous known Baker's cyst of left knee.  She presents for sudden onset of right knee locking that occurred yesterday afternoon while she was standing in her home.  She says locking sensation was incredibly painful and lasted about 3 minutes until she manually got her knee out of that position.  Since then, she continues to have a soreness of her right knee, but it is much improved since yesterday. Pain was worse with going up and down stairs.  She is wary of twisting the knee or putting much weight on it.  She has not noticed swelling.  She took 2 Tylenol with no improvement.  A hot bath with Epsom salts did help.  She barely slept last night due to pain.  She denies any known injury to this knee in the past.  ROS: No fever, no rash.  Allergies  Allergen Reactions  . Other Other (See Comments)    hallucinations   . Procaine Other (See Comments)    REACTION:  Lowers blood pressure, "makes me loopy, can't move or talk"    Social History   Tobacco Use  . Smoking status: Never Smoker  . Smokeless tobacco: Never Used  Substance Use Topics  . Alcohol use: No    Objective: Blood pressure 120/72, height 5' 5.5" (1.664 m), weight 175 lb (79.4 kg). Body mass index is 28.68 kg/m. Constitutional: Overweight female, very pleasant, in no apparent distress. Musculoskeletal: Medial aspect of right knee appears swollen compared to left knee.  Somewhat increased warmth of her right knee.  Point tenderness to palpation over right lateral joint line.  Minimal crepitus appreciated bilaterally.  Extension of knees intact bilaterally.  Slightly decreased flexion of right knee due to pain.  Could not perform Thessaly's test on right due to discomfort.  Patient favoring left leg. Neurological: Peripheral sensation  intact. Skin: Skin is warm and dry.  No ecchymosis. No rash noted.  Psychiatric: Normal mood and affect.  Vitals reviewed  Ultrasound of right knee: Moderately sized Baker's cyst present between gastrocnemius and hamstring muscles on the right.  Medial meniscus appears intact on the right.  Trace amount of fluid seen within suprapatellar pouch.  Assessment/Plan: Right knee pain -Acute, improving.  Concern for retained body given point tenderness and acute nature of pain.  Baker's cyst unlikely to be causing current symptoms.  X-ray ordered to rule this out.  Dr. Micheline Rhodes to call patient on her cell phone at [phone] to discuss results once x-ray performed. -Recommended wearing a compression sleeve to reduce swelling.  Patient fitted for this. -Tylenol #3 nightly as needed ordered for patient without refills, as she reports this has worked for pain for her in the past. -Offered steroid injection of the knee to help with inflammation.  However, patient declined at this time.  This seems reasonable since she is having improvement in pain already.  If pain worsens, reconsider knee injection.  Follow-up pending x-ray results.  Carly Floss, MD [place], PGY-3   Patient seen and evaluated with the resident. I agree with the above plan of care. Patient's x-rays show only minimal degenerative changes. No obvious loose body is seen. We will proceed with treatment as above. I did discuss the possibility of a cortisone injection if her symptoms do not continue to improve. Follow-up with me  again in 2-3 weeks. If patient experiences another episode of locking then we will need to consider an MRI specifically to rule out a bucket-handle meniscus tear.

## 2017-10-28 ENCOUNTER — Other Ambulatory Visit: Payer: Self-pay | Admitting: Pulmonary Disease

## 2017-11-10 ENCOUNTER — Other Ambulatory Visit: Payer: Self-pay | Admitting: Pulmonary Disease

## 2017-11-11 ENCOUNTER — Encounter: Payer: Self-pay | Admitting: Sports Medicine

## 2017-11-11 ENCOUNTER — Ambulatory Visit (INDEPENDENT_AMBULATORY_CARE_PROVIDER_SITE_OTHER): Payer: PPO | Admitting: Sports Medicine

## 2017-11-11 VITALS — BP 120/74 | Ht 65.5 in | Wt 175.0 lb

## 2017-11-11 DIAGNOSIS — M76821 Posterior tibial tendinitis, right leg: Secondary | ICD-10-CM

## 2017-11-11 NOTE — Progress Notes (Signed)
   HPI  [name] is here for follow up of her right ankle pain that was last seen on February as a posterior tibial tendon strain after eversion injury. She is wearing shoes with arch support inserts and these reduce pain. Barefoot standing reproduces pain within about 5 minutes. She also feels an achy pain over the medial ankle at rest some days and not others.  CC: Follow up for right ankle pain  Medications/Interventions Tried: Arch support inserts  See HPI and/or previous note for associated ROS.ness  ROS - no swelling in foot/ no numbness  Objective: BP 120/74   Ht 5' 5.5" (1.664 m)   Wt 175 lb (79.4 kg)   BMI 28.68 kg/m   Gen: NAD, well groomed, normal affect.  CV: Well-perfused. Warm.  Resp: Non-labored.  Neuro: Sensation intact throughout. Gait: Nonpathologic posture, stride is neutral with shoes on without severe limp MSK: Left ankle is tender to pressure superior to the medial malleolus, extends around to inferomedial aspect along posterior tibial tendon, ankle is without joint effusion or warmth, strength and ROM are preserved and symmetric to the right  Ultrasound evaluation demonstrates effusion surrounding the posterior tibial tendon near the top of the medial malleolus, with interval resolution of the previously seen distal small tear.  Amount of swelling is reduced from before.  Assessment and plan:  Posterior tibial tendinitis of right leg She is having slow progression of her pain but ultrasound and exam today demonstrate healing. She continues to have pain despite using arch supporting inserts. This injury was secondary to an ankle sprain from eversion stepping out of a car. We will start soft ankle brace with compression and provided instruction for ROM and leg raises for light strengthening.    Collier Salina, MD PGY-III Internal Medicine Resident 11/11/2017, 2:26 PM   I observed and examined the patient with the resident and agree with assessment and  plan.  Note reviewed and modified by me. Stefanie Libel, MD

## 2017-11-11 NOTE — Assessment & Plan Note (Addendum)
She is having some resolution of her pain and ultrasound and exam today demonstrate healing. She continues to have pain despite using arch supporting inserts. This injury was secondary to an ankle sprain from eversion stepping out of a car. We will start soft ankle brace with compression and provided instruction for ROM and leg raises for light strengthening.   Reck if not resolved in 6 weeks

## 2017-11-18 ENCOUNTER — Ambulatory Visit (INDEPENDENT_AMBULATORY_CARE_PROVIDER_SITE_OTHER): Payer: PPO

## 2017-11-18 DIAGNOSIS — E538 Deficiency of other specified B group vitamins: Secondary | ICD-10-CM

## 2017-11-19 MED ORDER — CYANOCOBALAMIN 1000 MCG/ML IJ SOLN
1000.0000 ug | Freq: Once | INTRAMUSCULAR | Status: AC
  Administered 2017-11-18: 1000 ug via INTRAMUSCULAR

## 2017-11-19 NOTE — Progress Notes (Signed)
Documentation of medication administration and charges of Vitamin B12 have been completed by Reika Callanan, CMA based on the Vitamin B12 documentation sheet completed by Tammy Scott.  

## 2017-12-15 DIAGNOSIS — Z8661 Personal history of infections of the central nervous system: Secondary | ICD-10-CM

## 2017-12-15 HISTORY — DX: Personal history of infections of the central nervous system: Z86.61

## 2017-12-17 ENCOUNTER — Ambulatory Visit: Payer: PPO

## 2017-12-20 ENCOUNTER — Ambulatory Visit (INDEPENDENT_AMBULATORY_CARE_PROVIDER_SITE_OTHER): Payer: PPO

## 2017-12-20 DIAGNOSIS — E538 Deficiency of other specified B group vitamins: Secondary | ICD-10-CM

## 2017-12-21 MED ORDER — CYANOCOBALAMIN 1000 MCG/ML IJ SOLN
1000.0000 ug | Freq: Once | INTRAMUSCULAR | Status: AC
  Administered 2017-12-20: 1000 ug via INTRAMUSCULAR

## 2017-12-21 NOTE — Progress Notes (Signed)
Documentation of medication administration and charges of Vitamin B12 have been completed by Lindsay Lemons, CMA based on the Vitamin B12 documentation sheet completed by Tammy Scott.  

## 2017-12-23 ENCOUNTER — Encounter: Payer: Self-pay | Admitting: Pulmonary Disease

## 2017-12-23 ENCOUNTER — Ambulatory Visit: Payer: PPO | Admitting: Pulmonary Disease

## 2017-12-23 ENCOUNTER — Other Ambulatory Visit (INDEPENDENT_AMBULATORY_CARE_PROVIDER_SITE_OTHER): Payer: PPO

## 2017-12-23 VITALS — BP 118/76 | HR 80 | Temp 98.2°F | Ht 65.5 in | Wt 176.8 lb

## 2017-12-23 DIAGNOSIS — R5383 Other fatigue: Secondary | ICD-10-CM

## 2017-12-23 DIAGNOSIS — F341 Dysthymic disorder: Secondary | ICD-10-CM

## 2017-12-23 DIAGNOSIS — N390 Urinary tract infection, site not specified: Secondary | ICD-10-CM | POA: Insufficient documentation

## 2017-12-23 DIAGNOSIS — G4700 Insomnia, unspecified: Secondary | ICD-10-CM

## 2017-12-23 DIAGNOSIS — R319 Hematuria, unspecified: Secondary | ICD-10-CM

## 2017-12-23 DIAGNOSIS — M150 Primary generalized (osteo)arthritis: Secondary | ICD-10-CM | POA: Diagnosis not present

## 2017-12-23 DIAGNOSIS — E785 Hyperlipidemia, unspecified: Secondary | ICD-10-CM | POA: Diagnosis not present

## 2017-12-23 DIAGNOSIS — E042 Nontoxic multinodular goiter: Secondary | ICD-10-CM | POA: Diagnosis not present

## 2017-12-23 DIAGNOSIS — E538 Deficiency of other specified B group vitamins: Secondary | ICD-10-CM | POA: Diagnosis not present

## 2017-12-23 DIAGNOSIS — M159 Polyosteoarthritis, unspecified: Secondary | ICD-10-CM

## 2017-12-23 LAB — URINALYSIS, ROUTINE W REFLEX MICROSCOPIC
Bilirubin Urine: NEGATIVE
KETONES UR: NEGATIVE
Nitrite: POSITIVE — AB
PH: 7 (ref 5.0–8.0)
SPECIFIC GRAVITY, URINE: 1.01 (ref 1.000–1.030)
Total Protein, Urine: NEGATIVE
URINE GLUCOSE: NEGATIVE
UROBILINOGEN UA: 0.2 (ref 0.0–1.0)

## 2017-12-23 MED ORDER — CIPROFLOXACIN HCL 250 MG PO TABS: 250.0000 mg | ORAL_TABLET | Freq: Two times a day (BID) | ORAL | 0 refills | Status: DC

## 2017-12-23 NOTE — Patient Instructions (Signed)
Today we updated your med list in our EPIC system...    Continue your current medications the same...  Today we checked a urinalysis & culture...  We are going to start you on CIPRO 250mg  one tab twice daily til gone...  We will call you w/ the results...  Get plenty of rest, fluids, Tylenol as needed.Marland KitchenMarland Kitchen

## 2017-12-23 NOTE — Progress Notes (Signed)
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 Hysterectomy  . HAMMER TOE SURGERY  1993   2nd toe left foot  . THYROID LOBECTOMY  09/15/2012   Procedure: THYROID LOBECTOMY;  Surgeon: John ByeMelissa Montaneocation: [facility] OR;  [place];  Laterality: Left;  . THYROIDECTOMY, PARTIAL  09/15/2012   Dr Byers  .Janace HoardILLECTOMY  1958    Outpatient Encounter Medications as of 12/23/2017  Medication Sig  . acetaminophen-codeine (TYLENOL #3) 300-30 MG tablet Take one tab at bedtime as needed  . acyclovir (ZOVIRAX) 400 MG tablet Take 1 tablet (400 mg total) by mouth 3 (three) times daily. As directed by physician  . ALPRAZolam (XANAX) 0.5 MG tablet TAKE 1/2 TO 1 (ONE-HALF TO ONE) TABLET BY MOUTH THREE TIMES DAILY AS  NEEDED  . aspirin 325 MG tablet Take 325 mg by mouth daily. Takes 1/2 tab  . b complex vitamins tablet Take 1 tablet by mouth daily.  . baclofen (LIORESAL) 10 MG tablet TAKE 1 TABLET BY MOUTH ONCE DAILY AT  BEDTIME  . cyanocobalamin (,VITAMIN B-12,) 1000 MCG/ML injection Inject 1,000 mcg into the muscle every 30 (thirty) days.  Marland Kitchen ezetimibe (ZETIA) 10 MG tablet TAKE 1 TABLET BY MOUTH ONCE DAILY  . MEGARED OMEGA-3 KRILL OIL PO Take by mouth daily.  . Multiple Vitamins-Minerals (ALIVE WOMENS ENERGY) TABS Take 1 tablet by mouth daily.  . ciprofloxacin (CIPRO) 250 MG tablet Take 1 tablet (250 mg total) by mouth 2 (two) times daily.   No facility-administered encounter medications on file as of 12/23/2017.     Allergies  Allergen Reactions  . Other Other (See Comments)    hallucinations   . Procaine Other (See Comments)    REACTION:  Lowers blood pressure, "makes me loopy, can't move or talk"    Immunization History  Administered Date(s) Administered  . Influenza Split 05/20/2011, 06/23/2012  . Influenza Whole 07/15/2009, 05/22/2010  . Influenza, High Dose Seasonal PF 06/17/2016, 06/15/2017  . Influenza,inj,Quad PF,6+ Mos 06/30/2013, 07/02/2014, 07/01/2015  . Pneumococcal Polysaccharide-23 05/31/2013  . Tdap 01/16/2015    Current Medications, Allergies, Past Medical History, Past Surgical History, Family History, and Social History were reviewed in [employer] record.   Review of Systems        The patient complains of recent URI symproms- cough/ congestion, sore throat + gas/bloating, back pain, stiffness, arthritis, depression.  The patient denies fever, chills, sweats, anorexia, fatigue, weakness, malaise, weight loss, sleep disorder, blurring, diplopia, eye irritation, eye discharge, vision loss, eye pain, photophobia, earache, ear discharge, tinnitus, decreased hearing, nasal congestion, nosebleeds, sore throat, hoarseness, chest pain, palpitations, syncope, dyspnea on exertion, orthopnea, PND, peripheral edema, cough, dyspnea at rest, excessive sputum, hemoptysis, wheezing, pleurisy, nausea, vomiting, diarrhea, constipation, change in bowel habits, abdominal pain,  melena, hematochezia, jaundice, indigestion/heartburn, dysphagia, odynophagia, dysuria, hematuria, urinary frequency, urinary hesitancy, nocturia, incontinence, joint pain, joint swelling, muscle cramps, muscle weakness, sciatica, restless legs, leg pain at night, leg pain with exertion, rash, itching, dryness, suspicious lesions, paralysis, paresthesias, seizures, tremors, vertigo, transient blindness, frequent falls, frequent headaches, difficulty walking, anxiety, memory loss, confusion, cold intolerance, heat intolerance, polydipsia, polyphagia, polyuria, unusual weight change, abnormal bruising, bleeding, enlarged lymph nodes, urticaria, allergic rash, and recurrent infections.     Objective:   Physical Exam     WD, WN, 72 y/o WF in NAD... GENERAL:  Alert & oriented; pleasant & cooperative... HEENT:  [place]/AT, EOM-full, PERRLA, EACs-clear, TMs-normal, NOSE-sl pale, THROAT- sl erythema w/o exud... NECK:  Supple w/ fairROM; no JVD; normal carotid impulses w/o bruits; +thyromegaly w/ coarse bilat nodularity; no lymphadenopathy. CHEST:  Coarse BS w/ no wheezes, rales, or signs of consolidation. HEART:  Regular Rhythm; without murmurs/ rubs/ or gallops. ABDOMEN:  Soft & nontender; incr bowel sounds; no organomegaly or masses detected. EXT: without deformities or arthritic changes; no varicose veins/ venous insuffic/ or edema. NEURO:  no focal neuro deficits... DERM: no skin lesions or rash identified...  RADIOLOGY DATA:  Reviewed in the EPIC EMR & discussed w/ the patient...  LABORATORY DATA:  Reviewed in the EPIC EMR & discussed w/ the patient...   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  Thyroid>  Thyroid function is wnl, nodules are palp & Sonar 12/13 showed most nodules stable in size but dom nodule left lower pole is NEW & subseq left thyroid lobectomy surg by DrByers 1/14 (see above)...  Ortho- LBP, wrist, shoulder> she is managed by DrFields & is improved overall...  Osteop> BMD 6/09 at [place] -2.9 in Spine, and -2.1 in left [facility]; treated w/ Alendronate 75m/wk but pt stopped on her own using calcium, MVI, VitD; f/u BMD 10/14 showed lowest Tscore -2.1 & she wants to continue Fosamax70, calcium, MVI, VitD (level = 62)... BMD 11/16 IMPROVED & Alendronate stopped for 241yroneymoon period...  B12 Deficiency>  She is delighted to see that her B12 level is sl low as she wants B12 shots and knows that this will give her more energy... REC to start B12 [id number] monthly => f/u B12 level is >1500 on these shots.  Anxiety/ Depression> she was stable on Prozac40 & Xanax 0.5mg29m needed (taking one qhs); then she stopped the Prozac after reading something about potential side effects- offered Lexapro or Psyche referral for other med considerations but she declines...     Plan:     Patient's Medications  New Prescriptions   CIPROFLOXACIN (CIPRO) 250 MG TABLET    Take 1 tablet (250 mg total) by mouth 2 (two) times daily.  Previous Medications   ACETAMINOPHEN-CODEINE (TYLENOL #3) 300-30 MG TABLET    Take one tab at bedtime as needed   ACYCLOVIR (ZOVIRAX) 400 MG TABLET    Take 1 tablet (400 mg total) by mouth 3 (three) times daily. As directed by physician   ALPRAZOLAM (XANAX) 0.5 MG TABLET    TAKE 1/2 TO 1 (ONE-HALF TO ONE) TABLET BY MOUTH THREE TIMES DAILY AS NEEDED   ASPIRIN 325 MG TABLET    Take 325 mg by mouth daily. Takes 1/2 tab   B COMPLEX VITAMINS TABLET    Take 1 tablet by mouth daily.   BACLOFEN  (LIORESAL) 10 MG TABLET    TAKE 1 TABLET BY MOUTH ONCE DAILY AT BEDTIME   CYANOCOBALAMIN (,VITAMIN B-12,) 1000 MCG/ML INJECTION    Inject 1,000 mcg into the muscle every 30 (thirty) days.   EZETIMIBE (ZETIA) 10 MG TABLET    TAKE 1 TABLET BY MOUTH ONCE DAILY   MEGARED OMEGA-3 KRILL OIL PO    Take by mouth daily.   MULTIPLE VITAMINS-MINERALS (ALIVE WOMENS ENERGY) TABS    Take 1 tablet by mouth daily.  Modified Medications   No medications on file  Discontinued Medications   No medications on file

## 2017-12-25 LAB — URINE CULTURE
MICRO NUMBER: 90567232
SPECIMEN QUALITY:: ADEQUATE

## 2017-12-27 ENCOUNTER — Other Ambulatory Visit: Payer: Self-pay | Admitting: Pulmonary Disease

## 2017-12-27 ENCOUNTER — Telehealth: Payer: Self-pay | Admitting: Pulmonary Disease

## 2017-12-27 NOTE — Telephone Encounter (Signed)
Spoke with pt, she states she spoke with SN already and she was thinking he was going to send in more medication.   I called the pharmacy and they stated there was not a Rx for pt there because I thought maybe SN called it in.   I called the pt but there was no answer. LM to call back so I could verify if SN wanted to refill medication.   Current Outpatient Medications on File Prior to Visit  Medication Sig Dispense Refill  . acetaminophen-codeine (TYLENOL #3) 300-30 MG tablet Take one tab at bedtime as needed 20 tablet 0  . acyclovir (ZOVIRAX) 400 MG tablet Take 1 tablet (400 mg total) by mouth 3 (three) times daily. As directed by physician 30 tablet 2  . ALPRAZolam (XANAX) 0.5 MG tablet TAKE 1/2 TO 1 (ONE-HALF TO ONE) TABLET BY MOUTH THREE TIMES DAILY AS NEEDED 90 tablet 5  . aspirin 325 MG tablet Take 325 mg by mouth daily. Takes 1/2 tab    . b complex vitamins tablet Take 1 tablet by mouth daily.    . baclofen (LIORESAL) 10 MG tablet TAKE 1 TABLET BY MOUTH ONCE DAILY AT BEDTIME 30 tablet 6  . ciprofloxacin (CIPRO) 250 MG tablet Take 1 tablet (250 mg total) by mouth 2 (two) times daily. 10 tablet 0  . cyanocobalamin (,VITAMIN B-12,) 1000 MCG/ML injection Inject 1,000 mcg into the muscle every 30 (thirty) days.    Marland Kitchen ezetimibe (ZETIA) 10 MG tablet TAKE 1 TABLET BY MOUTH ONCE DAILY 30 tablet 5  . MEGARED OMEGA-3 KRILL OIL PO Take by mouth daily.    . Multiple Vitamins-Minerals (ALIVE WOMENS ENERGY) TABS Take 1 tablet by mouth daily.     No current facility-administered medications on file prior to visit.    Allergies  Allergen Reactions  . Other Other (See Comments)    hallucinations   . Procaine Other (See Comments)    REACTION:  Lowers blood pressure, "makes me loopy, can't move or talk"

## 2017-12-28 MED ORDER — CIPROFLOXACIN HCL 250 MG PO TABS: 250.0000 mg | ORAL_TABLET | Freq: Two times a day (BID) | ORAL | 0 refills | Status: DC

## 2017-12-28 NOTE — Telephone Encounter (Signed)
Pt aware of recs.  rx sent to preferred pharmacy.  Nothing further needed.  

## 2017-12-28 NOTE — Telephone Encounter (Signed)
Per SN -  Cipro 250mg  #10, 1 tab PO BID till gone.

## 2017-12-30 ENCOUNTER — Ambulatory Visit: Payer: PPO | Admitting: Pulmonary Disease

## 2018-01-03 ENCOUNTER — Telehealth: Payer: Self-pay | Admitting: Pulmonary Disease

## 2018-01-03 DIAGNOSIS — F341 Dysthymic disorder: Secondary | ICD-10-CM

## 2018-01-03 DIAGNOSIS — E7800 Pure hypercholesterolemia, unspecified: Secondary | ICD-10-CM

## 2018-01-03 DIAGNOSIS — M545 Low back pain, unspecified: Secondary | ICD-10-CM

## 2018-01-03 DIAGNOSIS — M810 Age-related osteoporosis without current pathological fracture: Secondary | ICD-10-CM

## 2018-01-03 MED ORDER — ACYCLOVIR 400 MG PO TABS: 400.0000 mg | ORAL_TABLET | Freq: Three times a day (TID) | ORAL | 2 refills | Status: DC

## 2018-01-03 NOTE — Telephone Encounter (Signed)
Spoke with Patient, she is wanting a refill of her current cold sore treatment, Acyclovir  400mg  TID as needed. Prescription sent to [facility] at [place] per Patient request. Per SN- Ok for acyclovir  Refill #30 with 2 refills. Nothing further needed at this time.

## 2018-01-03 NOTE — Telephone Encounter (Signed)
Called and spoke with pt.  Pt is requesting refill for Valtrex 500mg , as she developed a cold sore over the weekend.  Rx last refilled 12/16/11 #15.  SN please advise. Thanks  Current Outpatient Medications on File Prior to Visit  Medication Sig Dispense Refill  . acetaminophen-codeine (TYLENOL #3) 300-30 MG tablet Take one tab at bedtime as needed 20 tablet 0  . acyclovir (ZOVIRAX) 400 MG tablet Take 1 tablet (400 mg total) by mouth 3 (three) times daily. As directed by physician 30 tablet 2  . ALPRAZolam (XANAX) 0.5 MG tablet TAKE 1/2 TO 1 (ONE-HALF TO ONE) TABLET BY MOUTH THREE TIMES DAILY AS NEEDED 90 tablet 5  . aspirin 325 MG tablet Take 325 mg by mouth daily. Takes 1/2 tab    . b complex vitamins tablet Take 1 tablet by mouth daily.    . baclofen (LIORESAL) 10 MG tablet TAKE 1 TABLET BY MOUTH ONCE DAILY AT BEDTIME 30 tablet 6  . ciprofloxacin (CIPRO) 250 MG tablet Take 1 tablet (250 mg total) by mouth 2 (two) times daily. 10 tablet 0  . cyanocobalamin (,VITAMIN B-12,) 1000 MCG/ML injection Inject 1,000 mcg into the muscle every 30 (thirty) days.    Marland Kitchen ezetimibe (ZETIA) 10 MG tablet TAKE 1 TABLET BY MOUTH ONCE DAILY 30 tablet 5  . MEGARED OMEGA-3 KRILL OIL PO Take by mouth daily.    . Multiple Vitamins-Minerals (ALIVE WOMENS ENERGY) TABS Take 1 tablet by mouth daily.     No current facility-administered medications on file prior to visit.     Allergies  Allergen Reactions  . Other Other (See Comments)    hallucinations   . Procaine Other (See Comments)    REACTION:  Lowers blood pressure, "makes me loopy, can't move or talk"

## 2018-01-06 ENCOUNTER — Encounter: Payer: Self-pay | Admitting: Pulmonary Disease

## 2018-01-06 ENCOUNTER — Other Ambulatory Visit (INDEPENDENT_AMBULATORY_CARE_PROVIDER_SITE_OTHER): Payer: PPO

## 2018-01-06 ENCOUNTER — Ambulatory Visit: Payer: PPO | Admitting: Pulmonary Disease

## 2018-01-06 ENCOUNTER — Telehealth: Payer: Self-pay | Admitting: Pulmonary Disease

## 2018-01-06 VITALS — BP 116/70 | HR 90 | Temp 99.2°F | Ht 65.5 in | Wt 177.0 lb

## 2018-01-06 DIAGNOSIS — M150 Primary generalized (osteo)arthritis: Secondary | ICD-10-CM | POA: Diagnosis not present

## 2018-01-06 DIAGNOSIS — B349 Viral infection, unspecified: Secondary | ICD-10-CM

## 2018-01-06 DIAGNOSIS — E785 Hyperlipidemia, unspecified: Secondary | ICD-10-CM | POA: Diagnosis not present

## 2018-01-06 DIAGNOSIS — E042 Nontoxic multinodular goiter: Secondary | ICD-10-CM | POA: Diagnosis not present

## 2018-01-06 DIAGNOSIS — E538 Deficiency of other specified B group vitamins: Secondary | ICD-10-CM

## 2018-01-06 DIAGNOSIS — F341 Dysthymic disorder: Secondary | ICD-10-CM | POA: Diagnosis not present

## 2018-01-06 DIAGNOSIS — G4700 Insomnia, unspecified: Secondary | ICD-10-CM

## 2018-01-06 DIAGNOSIS — R5383 Other fatigue: Secondary | ICD-10-CM

## 2018-01-06 DIAGNOSIS — M159 Polyosteoarthritis, unspecified: Secondary | ICD-10-CM

## 2018-01-06 LAB — CBC WITH DIFFERENTIAL/PLATELET
BASOS ABS: 0.1 10*3/uL (ref 0.0–0.1)
Basophils Relative: 1 % (ref 0.0–3.0)
EOS ABS: 0 10*3/uL (ref 0.0–0.7)
Eosinophils Relative: 0.3 % (ref 0.0–5.0)
HCT: 43.3 % (ref 36.0–46.0)
Hemoglobin: 14.8 g/dL (ref 12.0–15.0)
LYMPHS ABS: 0.9 10*3/uL (ref 0.7–4.0)
Lymphocytes Relative: 15.3 % (ref 12.0–46.0)
MCHC: 34.1 g/dL (ref 30.0–36.0)
MCV: 83.2 fl (ref 78.0–100.0)
Monocytes Absolute: 0.4 10*3/uL (ref 0.1–1.0)
Monocytes Relative: 6.4 % (ref 3.0–12.0)
NEUTROS PCT: 77 % (ref 43.0–77.0)
Neutro Abs: 4.4 10*3/uL (ref 1.4–7.7)
Platelets: 189 10*3/uL (ref 150.0–400.0)
RBC: 5.21 Mil/uL — AB (ref 3.87–5.11)
RDW: 13.7 % (ref 11.5–15.5)
WBC: 5.8 10*3/uL (ref 4.0–10.5)

## 2018-01-06 LAB — COMPREHENSIVE METABOLIC PANEL
ALK PHOS: 78 U/L (ref 39–117)
ALT: 19 U/L (ref 0–35)
AST: 23 U/L (ref 0–37)
Albumin: 4.1 g/dL (ref 3.5–5.2)
BUN: 12 mg/dL (ref 6–23)
CO2: 28 mEq/L (ref 19–32)
Calcium: 9.6 mg/dL (ref 8.4–10.5)
Chloride: 100 mEq/L (ref 96–112)
Creatinine, Ser: 0.85 mg/dL (ref 0.40–1.20)
GFR: 69.62 mL/min (ref >60.00)
GLUCOSE: 111 mg/dL — AB (ref 70–99)
POTASSIUM: 4.1 meq/L (ref 3.5–5.1)
Sodium: 135 mEq/L (ref 135–145)
TOTAL PROTEIN: 7 g/dL (ref 6.0–8.3)
Total Bilirubin: 0.9 mg/dL (ref 0.2–1.2)

## 2018-01-06 LAB — SEDIMENTATION RATE: Sed Rate: 7 mm/hr (ref 0–30)

## 2018-01-06 LAB — TSH: TSH: 0.37 u[IU]/mL (ref 0.35–4.50)

## 2018-01-06 NOTE — Telephone Encounter (Signed)
Returned Patient's call.  Gave her results and recommendations from SN. Patient stated understanding. Nothing further needed at this time.

## 2018-01-06 NOTE — Patient Instructions (Signed)
Today we updated your med list in our EPIC system...    Continue your current medications the same...  Continue your ACYCLOVIR for the fever blister...  Today we checked your follow up blood work...    We will contact you w/ the results when available...   Main treatment at this point is REST, FLUIDS, TYLENOL (2tabs three times daily as needed)...  Call for any questions.Marland KitchenMarland Kitchen

## 2018-01-06 NOTE — Progress Notes (Signed)
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 Prozac40 & Alpraz 0.'5mg'$  Prn & requests refills... ~  12/13:  She is off the prev Xanax & Prozac- apparently she is seeing a Psychiatrist but she couldn't tell me who or what med they changed her to... ~  5/14: She had f/u 5/14 w/ TP for anxiety & depression> she stopped her Lexapro (wasn't helping) & requested to go back on Prozac40; getting counselling which she feels is helpful; notes family stress & rec to continue talking/counseling ~  10/15: she has remained on Prozac40 & Xanax0.5 prn (taking 1Qhs) and stable; she requests to continue the same meds...   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 [facility] lipoma excision and removal of dystrophic right great toenail by DrMarks in 2009.  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 we do not have this report....  ~  GYN:  routine GYN- saw DrDickstein in 2009... she has her Mammograms at [facility] (last 8/13=neg), but hasn't had  a BMD- "they don't work, it was in the news"--- finally had BMD 6/09 by Erling Conte GYN= TScores -2.9 in spine,  & -2.1 in left [facility] w/ rec for Rx by DrDickstein on ALENDRONATE... Vit D level 3/10= 41...  ~  IMMUNIZ:  Given Pneumovax 10/14... Had TDAP 2011 in hosp ER... Gets yearly Flu vaccine   Past Surgical History:  Procedure Laterality Date  . ABDOMINAL HYSTERECTOMY  1984  . APPENDECTOMY  1984   at time of Hysterectomy  . HAMMER TOE SURGERY  1993   2nd toe left foot  . THYROID LOBECTOMY  09/15/2012   Procedure: THYROID LOBECTOMY;  Surgeon: Melissa Montane, MD;  Location: [facility];  Service: ENT;  Laterality: Left;  . THYROIDECTOMY, PARTIAL  09/15/2012   Dr Janace Hoard  . TONSILLECTOMY  1957    Outpatient Encounter Medications as of 01/06/2018  Medication Sig  . acetaminophen-codeine (TYLENOL #3) 300-30 MG tablet Take one tab at bedtime as needed  . acyclovir (ZOVIRAX) 400 MG tablet Take 1 tablet (400 mg total) by mouth 3 (three) times daily. As directed by physician  . ALPRAZolam (XANAX) 0.5 MG tablet TAKE 1/2 TO 1 (ONE-HALF TO ONE) TABLET BY MOUTH THREE TIMES DAILY AS NEEDED  . aspirin 325 MG tablet Take 325 mg by mouth daily. Takes 1/2 tab  . b complex vitamins tablet Take 1 tablet by mouth daily.  . baclofen (LIORESAL) 10 MG tablet TAKE 1 TABLET BY MOUTH ONCE DAILY AT BEDTIME  . cyanocobalamin (,VITAMIN B-12,) 1000 MCG/ML injection Inject 1,000 mcg into the muscle every 30 (thirty) days.  Marland Kitchen ezetimibe (ZETIA) 10 MG tablet TAKE 1 TABLET BY MOUTH ONCE DAILY  . MEGARED OMEGA-3 KRILL OIL PO Take by mouth daily.  . Multiple Vitamins-Minerals (ALIVE WOMENS ENERGY) TABS Take 1 tablet by mouth daily.  . [DISCONTINUED] ciprofloxacin (CIPRO) 250 MG tablet Take 1 tablet (250 mg total) by mouth 2 (two) times daily.   No facility-administered encounter medications on file as of 01/06/2018.     Allergies  Allergen Reactions  . Other Other (See Comments)    hallucinations   . Procaine Other (See  Comments)    REACTION:  Lowers blood pressure, "makes me loopy, can't move or talk"    Immunization History  Administered Date(s) Administered  . Influenza Split 05/20/2011, 06/23/2012  . Influenza Whole 07/15/2009, 05/22/2010  . Influenza, High Dose Seasonal PF 06/17/2016, 06/15/2017  . Influenza,inj,Quad PF,6+ Mos 06/30/2013, 07/02/2014, 07/01/2015  . Pneumococcal Polysaccharide-23 05/31/2013  . Tdap 01/16/2015    Current Medications, Allergies, Past Medical History, Past Surgical History, Family History, and Social History were reviewed in [employer] record.   Review of Systems        The patient complains of recent URI symproms- cough/ congestion, sore throat + gas/bloating, back pain, stiffness, arthritis, depression.  The patient denies fever, chills, sweats, anorexia, fatigue, weakness, malaise, weight loss, sleep disorder, blurring, diplopia, eye irritation, eye discharge, vision loss, eye pain, photophobia, earache, ear discharge, tinnitus, decreased hearing, nasal congestion, nosebleeds, sore throat, hoarseness, chest pain, palpitations, syncope, dyspnea on exertion, orthopnea, PND, peripheral edema, cough, dyspnea at rest, excessive sputum, hemoptysis, wheezing, pleurisy, nausea, vomiting, diarrhea, constipation, change  in bowel habits, abdominal pain, melena, hematochezia, jaundice, indigestion/heartburn, dysphagia, odynophagia, dysuria, hematuria, urinary frequency, urinary hesitancy, nocturia, incontinence, joint pain, joint swelling, muscle cramps, muscle weakness, sciatica, restless legs, leg pain at night, leg pain with exertion, rash, itching, dryness, suspicious lesions, paralysis, paresthesias, seizures, tremors, vertigo, transient blindness, frequent falls, frequent headaches, difficulty walking, anxiety, memory loss, confusion, cold intolerance, heat intolerance, polydipsia, polyphagia, polyuria, unusual weight change, abnormal bruising, bleeding,  enlarged lymph nodes, urticaria, allergic rash, and recurrent infections.     Objective:   Physical Exam     WD, WN, 72 y/o WF in NAD... GENERAL:  Alert & oriented; pleasant & cooperative... HEENT:  [place]/AT, EOM-full, PERRLA, EACs-clear, TMs-normal, NOSE-sl pale, THROAT- sl erythema w/o exud... NECK:  Supple w/ fairROM; no JVD; normal carotid impulses w/o bruits; +thyromegaly w/ coarse bilat nodularity; no lymphadenopathy. CHEST:  Coarse BS w/ no wheezes, rales, or signs of consolidation. HEART:  Regular Rhythm; without murmurs/ rubs/ or gallops. ABDOMEN:  Soft & nontender; incr bowel sounds; no organomegaly or masses detected. EXT: without deformities or arthritic changes; no varicose veins/ venous insuffic/ or edema. NEURO:  no focal neuro deficits... DERM: no skin lesions or rash identified...  RADIOLOGY DATA:  Reviewed in the EPIC EMR & discussed w/ the patient...  LABORATORY DATA:  Reviewed in the EPIC EMR & discussed w/ the patient...   Assessment:      12/23/17>   Bladder infection w/ sens EColi => Rx w/ CIPRO '250mg'$ Bid; she understands that she may need GYN & GU follow up appts if this is a recurrent problem 01/06/18>   I suspect that Carly Rhodes is correct- she prob has a viral, obvious HSV1 on right lower lip (continue Acyclovir), REST/ FLUIDS/ TYLENOL;  She remains under a lot of stress caring for her 73 y/o husb and does not appear to have much additional help at home (rec to get his children to ptch in & give her a breather!   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  Thyroid>  Thyroid function is wnl, nodules are palp & Sonar 12/13 showed most nodules stable in size but dom nodule left lower  pole is NEW & subseq left thyroid lobectomy surg by DrByers 1/14 (see above)...  Ortho- LBP, wrist, shoulder> she is managed by DrFields & is improved overall...  Osteop> BMD 6/09 at [place] -2.9 in Spine, and -2.1 in left [facility]; treated w/ Alendronate '70mg'$ /wk but pt stopped on her own using calcium, MVI, VitD; f/u BMD 10/14 showed lowest Tscore -2.1 & she wants to continue Fosamax70, calcium, MVI, VitD (level = 62)... BMD 11/16 IMPROVED & Alendronate stopped for 44yrhoneymoon period...  B12 Deficiency>  She is delighted to see that her B12 level is sl low as she wants B12 shots and knows that this will give her more energy... REC to start B12 10058m shot monthly => f/u B12 level is >1500 on these shots.  Anxiety/ Depression> she was stable on Prozac40 & Xanax 0.'5mg'$  as needed (taking one qhs); then she stopped the Prozac after reading something about potential side effects- offered Lexapro or Psyche referral for other med considerations but she declines...Marland KitchenMarland Kitchen  Plan:     Patient's Medications  New Prescriptions   No medications on file  Previous Medications   ACETAMINOPHEN-CODEINE (TYLENOL #3) 300-30 MG TABLET    Take one tab at bedtime as needed   ACYCLOVIR (ZOVIRAX) 400 MG TABLET    Take 1 tablet (400 mg total) by mouth 3 (three) times daily. As directed by physician   ALPRAZOLAM (XANAX) 0.5 MG TABLET    TAKE 1/2 TO 1 (ONE-HALF TO ONE) TABLET BY MOUTH THREE TIMES DAILY AS NEEDED   ASPIRIN 325 MG TABLET    Take 325 mg by mouth daily. Takes 1/2 tab   B COMPLEX VITAMINS TABLET    Take 1 tablet by mouth daily.   BACLOFEN (LIORESAL) 10 MG TABLET    TAKE 1 TABLET BY MOUTH ONCE DAILY AT BEDTIME   CYANOCOBALAMIN (,VITAMIN B-12,) 1000 MCG/ML INJECTION    Inject 1,000 mcg into the muscle every 30 (thirty) days.   EZETIMIBE (ZETIA) 10 MG TABLET    TAKE 1 TABLET BY MOUTH ONCE DAILY   MEGARED OMEGA-3 KRILL OIL PO    Take by mouth daily.   MULTIPLE VITAMINS-MINERALS (ALIVE WOMENS  ENERGY) TABS    Take 1 tablet by mouth daily.  Modified Medications   No medications on file  Discontinued Medications   CIPROFLOXACIN (CIPRO) 250 MG TABLET    Take 1 tablet (250 mg total) by mouth 2 (two) times daily.

## 2018-01-07 ENCOUNTER — Telehealth: Payer: Self-pay | Admitting: Pulmonary Disease

## 2018-01-07 NOTE — Telephone Encounter (Signed)
Pt is returning call. Cb is [phone].

## 2018-01-07 NOTE — Telephone Encounter (Signed)
Called and spoke with patient, she states that she has had a severe headache, she cannot look down, neck stiffness and dizzy. Spoke with Dr. Lenna Gilford and he states with the patients symptoms she needs to go to the ER for evaluation.   Advised patient of SN response. Patient is going to ED.

## 2018-01-07 NOTE — Telephone Encounter (Signed)
Called patient, unable to reach, left message to give [place] a call back.

## 2018-01-10 ENCOUNTER — Encounter (HOSPITAL_COMMUNITY): Payer: Self-pay

## 2018-01-10 ENCOUNTER — Other Ambulatory Visit: Payer: Self-pay

## 2018-01-10 ENCOUNTER — Emergency Department (HOSPITAL_COMMUNITY): Payer: PPO

## 2018-01-10 ENCOUNTER — Inpatient Hospital Stay (HOSPITAL_COMMUNITY)
Admission: EM | Admit: 2018-01-10 | Discharge: 2018-01-15 | DRG: 071 | Disposition: A | Discharge status: Home Health | Payer: PPO | Attending: Internal Medicine | Admitting: Internal Medicine

## 2018-01-10 DIAGNOSIS — R519 Headache, unspecified: Secondary | ICD-10-CM | POA: Diagnosis present

## 2018-01-10 DIAGNOSIS — R42 Dizziness and giddiness: Secondary | ICD-10-CM | POA: Diagnosis not present

## 2018-01-10 DIAGNOSIS — Z800 Family history of malignant neoplasm of digestive organs: Secondary | ICD-10-CM

## 2018-01-10 DIAGNOSIS — G9340 Encephalopathy, unspecified: Principal | ICD-10-CM | POA: Diagnosis present

## 2018-01-10 DIAGNOSIS — G894 Chronic pain syndrome: Secondary | ICD-10-CM | POA: Diagnosis present

## 2018-01-10 DIAGNOSIS — D696 Thrombocytopenia, unspecified: Secondary | ICD-10-CM | POA: Diagnosis not present

## 2018-01-10 DIAGNOSIS — F329 Major depressive disorder, single episode, unspecified: Secondary | ICD-10-CM | POA: Diagnosis present

## 2018-01-10 DIAGNOSIS — T368X5A Adverse effect of other systemic antibiotics, initial encounter: Secondary | ICD-10-CM | POA: Diagnosis not present

## 2018-01-10 DIAGNOSIS — R4182 Altered mental status, unspecified: Secondary | ICD-10-CM | POA: Diagnosis not present

## 2018-01-10 DIAGNOSIS — E785 Hyperlipidemia, unspecified: Secondary | ICD-10-CM | POA: Diagnosis present

## 2018-01-10 DIAGNOSIS — I4891 Unspecified atrial fibrillation: Secondary | ICD-10-CM | POA: Diagnosis not present

## 2018-01-10 DIAGNOSIS — Z884 Allergy status to anesthetic agent status: Secondary | ICD-10-CM | POA: Diagnosis not present

## 2018-01-10 DIAGNOSIS — M810 Age-related osteoporosis without current pathological fracture: Secondary | ICD-10-CM | POA: Diagnosis not present

## 2018-01-10 DIAGNOSIS — M1990 Unspecified osteoarthritis, unspecified site: Secondary | ICD-10-CM | POA: Diagnosis not present

## 2018-01-10 DIAGNOSIS — F419 Anxiety disorder, unspecified: Secondary | ICD-10-CM | POA: Diagnosis present

## 2018-01-10 DIAGNOSIS — R404 Transient alteration of awareness: Secondary | ICD-10-CM | POA: Diagnosis not present

## 2018-01-10 DIAGNOSIS — T375X5A Adverse effect of antiviral drugs, initial encounter: Secondary | ICD-10-CM | POA: Diagnosis present

## 2018-01-10 DIAGNOSIS — H5500 Unspecified nystagmus: Secondary | ICD-10-CM | POA: Diagnosis not present

## 2018-01-10 DIAGNOSIS — F4489 Other dissociative and conversion disorders: Secondary | ICD-10-CM | POA: Diagnosis not present

## 2018-01-10 DIAGNOSIS — I495 Sick sinus syndrome: Secondary | ICD-10-CM | POA: Diagnosis not present

## 2018-01-10 DIAGNOSIS — D709 Neutropenia, unspecified: Secondary | ICD-10-CM | POA: Diagnosis present

## 2018-01-10 DIAGNOSIS — G3184 Mild cognitive impairment, so stated: Secondary | ICD-10-CM | POA: Diagnosis present

## 2018-01-10 DIAGNOSIS — M8580 Other specified disorders of bone density and structure, unspecified site: Secondary | ICD-10-CM | POA: Diagnosis present

## 2018-01-10 DIAGNOSIS — Z833 Family history of diabetes mellitus: Secondary | ICD-10-CM

## 2018-01-10 DIAGNOSIS — F418 Other specified anxiety disorders: Secondary | ICD-10-CM | POA: Diagnosis not present

## 2018-01-10 DIAGNOSIS — B009 Herpesviral infection, unspecified: Secondary | ICD-10-CM | POA: Diagnosis present

## 2018-01-10 DIAGNOSIS — H8309 Labyrinthitis, unspecified ear: Secondary | ICD-10-CM | POA: Diagnosis present

## 2018-01-10 DIAGNOSIS — N390 Urinary tract infection, site not specified: Secondary | ICD-10-CM | POA: Diagnosis not present

## 2018-01-10 DIAGNOSIS — B9620 Unspecified Escherichia coli [E. coli] as the cause of diseases classified elsewhere: Secondary | ICD-10-CM | POA: Diagnosis not present

## 2018-01-10 DIAGNOSIS — Z7982 Long term (current) use of aspirin: Secondary | ICD-10-CM

## 2018-01-10 DIAGNOSIS — I481 Persistent atrial fibrillation: Secondary | ICD-10-CM | POA: Diagnosis not present

## 2018-01-10 DIAGNOSIS — Z90710 Acquired absence of both cervix and uterus: Secondary | ICD-10-CM

## 2018-01-10 DIAGNOSIS — R509 Fever, unspecified: Secondary | ICD-10-CM

## 2018-01-10 DIAGNOSIS — R4020 Unspecified coma: Secondary | ICD-10-CM | POA: Diagnosis not present

## 2018-01-10 DIAGNOSIS — Z8249 Family history of ischemic heart disease and other diseases of the circulatory system: Secondary | ICD-10-CM

## 2018-01-10 DIAGNOSIS — B001 Herpesviral vesicular dermatitis: Secondary | ICD-10-CM | POA: Diagnosis present

## 2018-01-10 DIAGNOSIS — R51 Headache: Secondary | ICD-10-CM

## 2018-01-10 DIAGNOSIS — H8302 Labyrinthitis, left ear: Secondary | ICD-10-CM | POA: Diagnosis not present

## 2018-01-10 LAB — URINALYSIS, ROUTINE W REFLEX MICROSCOPIC
BACTERIA UA: NONE SEEN
Bilirubin Urine: NEGATIVE
Glucose, UA: NEGATIVE mg/dL
Ketones, ur: 20 mg/dL — AB
Leukocytes, UA: NEGATIVE
NITRITE: NEGATIVE
Protein, ur: NEGATIVE mg/dL
SPECIFIC GRAVITY, URINE: 1.008 (ref 1.005–1.030)
pH: 5 (ref 5.0–8.0)

## 2018-01-10 LAB — CSF CELL COUNT WITH DIFFERENTIAL
RBC COUNT CSF: 515 /mm3 — AB
RBC Count, CSF: 4400 /mm3 — ABNORMAL HIGH
TUBE #: 1
Tube #: 4
WBC CSF: 2 /mm3 (ref 0–5)
WBC, CSF: 2 /mm3 (ref 0–5)

## 2018-01-10 LAB — COMPREHENSIVE METABOLIC PANEL
ALBUMIN: 3.1 g/dL — AB (ref 3.5–5.0)
ALT: 50 U/L (ref 14–54)
ANION GAP: 11 (ref 5–15)
AST: 81 U/L — ABNORMAL HIGH (ref 15–41)
Alkaline Phosphatase: 59 U/L (ref 38–126)
BUN: 11 mg/dL (ref 6–20)
CALCIUM: 8.1 mg/dL — AB (ref 8.9–10.3)
CO2: 21 mmol/L — ABNORMAL LOW (ref 22–32)
CREATININE: 0.82 mg/dL (ref 0.44–1.00)
Chloride: 98 mmol/L — ABNORMAL LOW (ref 101–111)
GFR calc Af Amer: >60 mL/min (ref >60)
GFR calc non Af Amer: >60 mL/min (ref >60)
Glucose, Bld: 95 mg/dL (ref 65–99)
POTASSIUM: 3.3 mmol/L — AB (ref 3.5–5.1)
SODIUM: 130 mmol/L — AB (ref 135–145)
TOTAL PROTEIN: 5.7 g/dL — AB (ref 6.5–8.1)
Total Bilirubin: 1.7 mg/dL — ABNORMAL HIGH (ref 0.3–1.2)

## 2018-01-10 LAB — CBC WITH DIFFERENTIAL/PLATELET
Basophils Absolute: 0 10*3/uL (ref 0.0–0.1)
Basophils Relative: 1 %
Eosinophils Absolute: 0 10*3/uL (ref 0.0–0.7)
Eosinophils Relative: 0 %
HEMATOCRIT: 40.7 % (ref 36.0–46.0)
Hemoglobin: 14 g/dL (ref 12.0–15.0)
LYMPHS ABS: 0.6 10*3/uL — AB (ref 0.7–4.0)
Lymphocytes Relative: 28 %
MCH: 27.5 pg (ref 26.0–34.0)
MCHC: 34.4 g/dL (ref 30.0–36.0)
MCV: 80 fL (ref 78.0–100.0)
MONO ABS: 0.2 10*3/uL (ref 0.1–1.0)
Monocytes Relative: 8 %
NEUTROS PCT: 63 %
Neutro Abs: 1.5 10*3/uL — ABNORMAL LOW (ref 1.7–7.7)
Platelets: 69 10*3/uL — ABNORMAL LOW (ref 150–400)
RBC: 5.09 MIL/uL (ref 3.87–5.11)
RDW: 13.1 % (ref 11.5–15.5)
WBC Morphology: INCREASED
WBC: 2.3 10*3/uL — AB (ref 4.0–10.5)

## 2018-01-10 LAB — I-STAT CG4 LACTIC ACID, ED: Lactic Acid, Venous: 1.77 mmol/L (ref 0.5–1.9)

## 2018-01-10 LAB — CBG MONITORING, ED: GLUCOSE-CAPILLARY: 81 mg/dL (ref 65–99)

## 2018-01-10 LAB — GLUCOSE, CSF: GLUCOSE CSF: 60 mg/dL (ref 40–70)

## 2018-01-10 LAB — PROTIME-INR
INR: 1.06
PROTHROMBIN TIME: 13.7 s (ref 11.4–15.2)

## 2018-01-10 LAB — PROTEIN, CSF: TOTAL PROTEIN, CSF: 34 mg/dL (ref 15–45)

## 2018-01-10 MED ORDER — LORATADINE 10 MG PO TABS
10.0000 mg | ORAL_TABLET | Freq: Every day | ORAL | Status: DC
  Administered 2018-01-11 – 2018-01-15 (×4): 10 mg via ORAL
  Filled 2018-01-10 (×4): qty 1

## 2018-01-10 MED ORDER — ACETAMINOPHEN 325 MG PO TABS
325.0000 mg | ORAL_TABLET | Freq: Four times a day (QID) | ORAL | Status: DC | PRN
  Administered 2018-01-11 – 2018-01-12 (×2): 650 mg via ORAL
  Filled 2018-01-10 (×2): qty 2

## 2018-01-10 MED ORDER — MEGARED OMEGA-3 KRILL OIL 500 MG PO CAPS: ORAL_CAPSULE | Freq: Every day | ORAL | Status: DC

## 2018-01-10 MED ORDER — DEXAMETHASONE SODIUM PHOSPHATE 10 MG/ML IJ SOLN
10.0000 mg | Freq: Once | INTRAMUSCULAR | Status: AC
  Administered 2018-01-10: 10 mg via INTRAVENOUS
  Filled 2018-01-10: qty 1

## 2018-01-10 MED ORDER — SODIUM CHLORIDE 0.9 % IV SOLN
INTRAVENOUS | Status: DC
  Administered 2018-01-10 – 2018-01-12 (×3): via INTRAVENOUS

## 2018-01-10 MED ORDER — ONDANSETRON HCL 4 MG/2ML IJ SOLN: 4.0000 mg | Freq: Four times a day (QID) | INTRAMUSCULAR | Status: DC | PRN

## 2018-01-10 MED ORDER — VANCOMYCIN HCL IN DEXTROSE 1-5 GM/200ML-% IV SOLN
1000.0000 mg | Freq: Once | INTRAVENOUS | Status: AC
  Administered 2018-01-10: 1000 mg via INTRAVENOUS
  Filled 2018-01-10: qty 200

## 2018-01-10 MED ORDER — ACYCLOVIR SODIUM 50 MG/ML IV SOLN
10.0000 mg/kg | Freq: Once | INTRAVENOUS | Status: AC
  Administered 2018-01-10: 805 mg via INTRAVENOUS
  Filled 2018-01-10: qty 16.1

## 2018-01-10 MED ORDER — VANCOMYCIN HCL IN DEXTROSE 1-5 GM/200ML-% IV SOLN
1000.0000 mg | Freq: Two times a day (BID) | INTRAVENOUS | Status: DC
  Administered 2018-01-11: 1000 mg via INTRAVENOUS
  Filled 2018-01-10 (×2): qty 200

## 2018-01-10 MED ORDER — ONDANSETRON HCL 4 MG PO TABS
4.0000 mg | ORAL_TABLET | Freq: Four times a day (QID) | ORAL | Status: DC | PRN
  Administered 2018-01-12 (×2): 4 mg via ORAL
  Filled 2018-01-10 (×2): qty 1

## 2018-01-10 MED ORDER — SODIUM CHLORIDE 0.9 % IV SOLN
2.0000 g | Freq: Two times a day (BID) | INTRAVENOUS | Status: DC
  Administered 2018-01-10 – 2018-01-11 (×2): 2 g via INTRAVENOUS
  Filled 2018-01-10 (×3): qty 20

## 2018-01-10 MED ORDER — DEXTROSE 5 % IV SOLN
10.0000 mg/kg | Freq: Three times a day (TID) | INTRAVENOUS | Status: DC
  Administered 2018-01-11 (×2): 805 mg via INTRAVENOUS
  Filled 2018-01-10 (×3): qty 16.1

## 2018-01-10 MED ORDER — HEPARIN SODIUM (PORCINE) 5000 UNIT/ML IJ SOLN
5000.0000 [IU] | Freq: Three times a day (TID) | INTRAMUSCULAR | Status: DC
  Administered 2018-01-10 – 2018-01-11 (×2): 5000 [IU] via SUBCUTANEOUS
  Filled 2018-01-10 (×2): qty 1

## 2018-01-10 MED ORDER — BACLOFEN 10 MG PO TABS
10.0000 mg | ORAL_TABLET | Freq: Every day | ORAL | Status: DC
  Administered 2018-01-10 – 2018-01-14 (×5): 10 mg via ORAL
  Filled 2018-01-10 (×5): qty 1

## 2018-01-10 MED ORDER — SODIUM CHLORIDE 0.9 % IV SOLN
2.0000 g | Freq: Once | INTRAVENOUS | Status: AC
  Administered 2018-01-10: 2 g via INTRAVENOUS
  Filled 2018-01-10: qty 2000

## 2018-01-10 MED ORDER — SODIUM CHLORIDE 0.9 % IV BOLUS
1000.0000 mL | Freq: Once | INTRAVENOUS | Status: AC
  Administered 2018-01-10: 1000 mL via INTRAVENOUS

## 2018-01-10 MED ORDER — SODIUM CHLORIDE 0.9 % IV SOLN
2.0000 g | INTRAVENOUS | Status: DC
  Administered 2018-01-10 – 2018-01-11 (×3): 2 g via INTRAVENOUS
  Filled 2018-01-10 (×7): qty 2000

## 2018-01-10 MED ORDER — ASPIRIN 325 MG PO TABS
325.0000 mg | ORAL_TABLET | Freq: Every day | ORAL | Status: DC
  Administered 2018-01-11 – 2018-01-12 (×2): 325 mg via ORAL
  Filled 2018-01-10 (×2): qty 1

## 2018-01-10 MED ORDER — ACETAMINOPHEN 325 MG PO TABS
650.0000 mg | ORAL_TABLET | Freq: Once | ORAL | Status: AC
  Administered 2018-01-10: 650 mg via ORAL
  Filled 2018-01-10: qty 2

## 2018-01-10 MED ORDER — B COMPLEX PO TABS: 1.0000 | ORAL_TABLET | Freq: Every day | ORAL | Status: DC

## 2018-01-10 MED ORDER — EZETIMIBE 10 MG PO TABS
10.0000 mg | ORAL_TABLET | Freq: Every day | ORAL | Status: DC
  Administered 2018-01-11 – 2018-01-15 (×5): 10 mg via ORAL
  Filled 2018-01-10 (×5): qty 1

## 2018-01-10 MED ORDER — B COMPLEX-C PO TABS
1.0000 | ORAL_TABLET | Freq: Every day | ORAL | Status: DC
  Administered 2018-01-11 – 2018-01-15 (×5): 1 via ORAL
  Filled 2018-01-10 (×5): qty 1

## 2018-01-10 MED ORDER — FLUOXETINE HCL 20 MG PO CAPS
40.0000 mg | ORAL_CAPSULE | Freq: Every day | ORAL | Status: DC
  Administered 2018-01-11 – 2018-01-15 (×4): 40 mg via ORAL
  Filled 2018-01-10 (×5): qty 2

## 2018-01-10 NOTE — ED Triage Notes (Signed)
Pt arrives to ED from home with complaints of stroke like sx and ams since x3 days ago. EMS reports pt was told by PCP to go to ED last week for MRI and eval of stroke like symptoms (left side weakness, headache, AMS) and did not go. Pt experienced a fall early last night in the bathroom and reports not hitting her head or losing consciousness. Pt arrives to ED with a-fib in 140s with no hx.  Temp 102.4 orally upon arrival to ED; MD informed, bedside for evaluation. Pt placed in position of comfort with bed locked and lowered, call bell in reach.

## 2018-01-10 NOTE — ED Notes (Signed)
EDP at bedside performing lumbar puncture.

## 2018-01-10 NOTE — ED Notes (Signed)
ED Provider at bedside. 

## 2018-01-10 NOTE — ED Provider Notes (Addendum)
[facility] EMERGENCY DEPARTMENT Provider Note   CSN: [id number] Arrival date & time: 01/10/18  1444     History   Chief Complaint Chief Complaint  Patient presents with  . Stroke Symptoms  . Atrial Fibrillation  . Altered Mental Status    HPI Carly Rhodes is a 72 y.o. female.  HPI  72 year old female with a history of anxiety, depression and recurrent HSV-1 cold sores presents with headache and fever.  Patient tells me that she was told to come to the hospital about 5 days ago by her PCP but did not come.  However her husband seem to call EMS because the patient is worsening.  She is been having fever and chills as well as headache that has been improving but still is present.  It is a left-sided headache.  She feels diffusely weak.  EMS noted her to be in atrial fibrillation which appears to be a new problem.  The patient denies any other symptoms such as cough, shortness of breath, vomiting, abdominal pain or urinary symptoms.  She is currently on Cipro from her PCP and tells EMSs for the cold sore although chart review indicates it might be for a relatively recent UTI.  When first picked up she was confused, EMS notes that she knew her name but did not know what street she lived on.  She would refer to the year as 1919.  Past Medical History:  Diagnosis Date  . Anxiety   . Depression   . Family history of anesthesia complication    sister extreme nausea & vomiting  . 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 Osteoporosis   . Seasonal allergies     Patient Active Problem List   Diagnosis Date Noted  . Encephalopathy acute 01/10/2018  . Headache 01/10/2018  . UTI (urinary tract infection) 12/23/2017  . Right knee pain 10/19/2017  . Posterior tibial tendinitis of right leg 09/28/2017  . Insomnia 05/20/2017  . Incontinence of feces 05/11/2017  . Caregiver stress 05/11/2017  . Vaginal vault prolapse after hysterectomy 05/11/2017  . Vitamin B 12  deficiency 04/09/2017  . Fatigue 04/08/2017  . Acute upper respiratory infection 10/28/2016  . Pain of finger of right hand 10/08/2016  . Dyslipidemia 12/25/2015  . Osteoarthritis 12/25/2015  . Osteopenia 12/25/2015  . Cystocele with uterine prolapse 01/24/2015  . Effusion of left knee 05/10/2014  . Knee pain, left 11/21/2013  . Gallstones without obstruction of gallbladder 05/31/2013  . Tendinitis of right rotator cuff 02/16/2013  . Pain in joint, shoulder region 12/06/2012  . Wrist pain 04/26/2012  . Thumb pain 04/26/2012  . Herpes simplex virus (HSV) infection 07/05/2009  . NONTOXIC MULTINODULAR GOITER 07/05/2009  . LIPOMA OF OTHER SKIN AND SUBCUTANEOUS TISSUE 08/16/2007  . DYSTHYMIA 08/16/2007  . LOW BACK PAIN SYNDROME 08/16/2007    Past Surgical History:  Procedure Laterality Date  . ABDOMINAL HYSTERECTOMY  1984  . APPENDECTOMY  1984   at time of Hysterectomy  . HAMMER TOE SURGERY  1993   2nd toe left foot  . THYROID LOBECTOMY  09/15/2012   Procedure: THYROID LOBECTOMY;  Surgeon: Melissa Montane, MD;  Location: [facility];  Service: ENT;  Laterality: Left;  . THYROIDECTOMY, PARTIAL  09/15/2012   Dr Janace Hoard  . TONSILLECTOMY  1957     OB History    Gravida  2   Para  2   Term      Preterm      AB  Living  2     SAB      TAB      Ectopic      Multiple      Live Births               Home Medications    Prior to Admission medications   Medication Sig Start Date End Date Taking? Authorizing Provider  acetaminophen (TYLENOL) 325 MG tablet Take 325-650 mg by mouth every 6 (six) hours as needed for headache (pain).   Yes [provider]  acetaminophen-codeine (TYLENOL #3) 300-30 MG tablet Take one tab at bedtime as needed Patient taking differently: Take 1 tablet by mouth at bedtime as needed (pain).  10/19/17  Yes Draper, Christia Reading R, DO  ALPRAZolam (XANAX) 0.5 MG tablet TAKE 1/2 TO 1 (ONE-HALF TO ONE) TABLET BY MOUTH THREE TIMES DAILY AS  NEEDED Patient taking differently: TAKE 2 TABLETS BY MOUTH DAILY AT BEDTIME 09/02/17  Yes Noralee Space, MD  Ascorbic Acid (VITAMIN C PO) Take 1 tablet by mouth daily.   Yes [provider]  aspirin 325 MG tablet Take 162.5 mg by mouth daily.    Yes [provider]  b complex vitamins tablet Take 1 tablet by mouth daily.   Yes [provider]  baclofen (LIORESAL) 10 MG tablet TAKE 1 TABLET BY MOUTH ONCE DAILY AT BEDTIME 11/11/17  Yes Noralee Space, MD  cyanocobalamin (,VITAMIN B-12,) 1000 MCG/ML injection Inject 1,000 mcg into the muscle every 30 (thirty) days.   Yes [provider]  ezetimibe (ZETIA) 10 MG tablet TAKE 1 TABLET BY MOUTH ONCE DAILY 10/28/17  Yes Noralee Space, MD  Multiple Vitamin (MULTIVITAMIN WITH MINERALS) TABS tablet Take 1 tablet by mouth daily. Centrum   Yes [provider]  Omega-3 Fatty Acids (FISH OIL PO) Take 2 capsules by mouth daily.   Yes [provider]  acyclovir (ZOVIRAX) 400 MG tablet Take 1 tablet (400 mg total) by mouth 3 (three) times daily. As directed by physician 01/03/18   Noralee Space, MD    Family History Family History  Problem Relation Age of Onset  . Stomach cancer Brother 35  . Colon cancer Paternal Grandmother 71  . Diabetes Father   . Hypertension Sister   . Heart attack Neg Hx     Social History Social History   Tobacco Use  . Smoking status: Never Smoker  . Smokeless tobacco: Never Used  Substance Use Topics  . Alcohol use: No  . Drug use: No     Allergies   Procaine   Review of Systems Review of Systems  Constitutional: Positive for chills and fever.  Respiratory: Negative for cough and shortness of breath.   Cardiovascular: Negative for chest pain.  Gastrointestinal: Negative for abdominal pain and vomiting.  Genitourinary: Negative for dysuria.  Neurological: Positive for dizziness, weakness and headaches.  All other systems reviewed and are  negative.    Physical Exam Updated Vital Signs BP 111/69 (BP Location: Left Arm)   Pulse 98   Temp 97.7 F (36.5 C) (Oral)   Resp 16   Ht 5' 6.5" (1.689 m)   Wt 79.8 kg (175 lb 14.8 oz)   SpO2 94%   BMI 27.97 kg/m   Physical Exam  Constitutional: She appears well-developed and well-nourished.  HENT:  Head: Normocephalic and atraumatic.  Right Ear: External ear normal.  Left Ear: External ear normal.  Nose: Nose normal.  Eyes: Pupils are equal, round, and  reactive to light. EOM are normal. Right eye exhibits no discharge. Left eye exhibits no discharge.  Neck: Normal range of motion. Neck supple.  Cardiovascular: Normal heart sounds. An irregular rhythm present. Tachycardia present.  Pulmonary/Chest: Effort normal and breath sounds normal.  Abdominal: Soft. There is no tenderness.  Neurological: She is alert.  Awake, alert. Oriented to person and place. Knows it's May "19" but does not remember the day of the week. Takes a long time but eventually knows the president's name. CN 3-12 grossly intact. 5/5 strength in all 4 extremities. Grossly normal sensation. Normal finger to nose.   Skin: Skin is warm and dry. She is not diaphoretic.  Nursing note and vitals reviewed.    ED Treatments / Results  Labs (all labs ordered are listed, but only abnormal results are displayed) Labs Reviewed  COMPREHENSIVE METABOLIC PANEL - Abnormal; Notable for the following components:      Result Value   Sodium 130 (*)    Potassium 3.3 (*)    Chloride 98 (*)    CO2 21 (*)    Calcium 8.1 (*)    Total Protein 5.7 (*)    Albumin 3.1 (*)    AST 81 (*)    Total Bilirubin 1.7 (*)    All other components within normal limits  CBC WITH DIFFERENTIAL/PLATELET - Abnormal; Notable for the following components:   WBC 2.3 (*)    Platelets 69 (*)    Neutro Abs 1.5 (*)    Lymphs Abs 0.6 (*)    All other components within normal limits  URINALYSIS, ROUTINE W REFLEX MICROSCOPIC - Abnormal; Notable  for the following components:   APPearance HAZY (*)    Hgb urine dipstick MODERATE (*)    Ketones, ur 20 (*)    All other components within normal limits  CSF CELL COUNT WITH DIFFERENTIAL - Abnormal; Notable for the following components:   Color, CSF PINK (*)    Appearance, CSF HAZY (*)    RBC Count, CSF 4,400 (*)    All other components within normal limits  CSF CELL COUNT WITH DIFFERENTIAL - Abnormal; Notable for the following components:   RBC Count, CSF 515 (*)    All other components within normal limits  CSF CULTURE  CULTURE, BLOOD (ROUTINE X 2)  CULTURE, BLOOD (ROUTINE X 2)  URINE CULTURE  PROTIME-INR  GLUCOSE, CSF  PROTEIN, CSF  HERPES SIMPLEX VIRUS(HSV) DNA BY PCR  COMPREHENSIVE METABOLIC PANEL  CBC  I-STAT CG4 LACTIC ACID, ED  CBG MONITORING, ED    EKG EKG Interpretation  Date/Time:  Monday Jan 10 2018 15:13:00 EDT Ventricular Rate:  126 PR Interval:    QRS Duration: 100 QT Interval:  283 QTC Calculation: 410 R Axis:   73 Text Interpretation:  Atrial fibrillation Nonspecific repol abnormality, diffuse leads No old tracing to compare Confirmed by Sherwood Gambler [phone]) on 01/10/2018 7:00:40 PM   Radiology Dg Chest 2 View  Result Date: 01/10/2018 CLINICAL DATA:  Fever, altered mental status. EXAM: CHEST - 2 VIEW COMPARISON:  None. FINDINGS: Enlarged cardiac silhouette. Calcific atherosclerotic disease of the aorta and tortuosity. Mediastinal contours appear intact. There is no evidence of focal airspace consolidation, pleural effusion or pneumothorax. Low lung volumes with exaggeration of the interstitial markings. Thoracic kyphosis with stable changes of spondylosis of the thoracic spine. Soft tissues are grossly normal. IMPRESSION: No active cardiopulmonary disease. Enlarged heart. Electronically Signed   By: Fidela Salisbury M.D.   On: 01/10/2018 16:04   Ct  Head Wo Contrast  Result Date: 01/10/2018 CLINICAL DATA:  Altered level of consciousness. EXAM: CT  HEAD WITHOUT CONTRAST TECHNIQUE: Contiguous axial images were obtained from the base of the skull through the vertex without intravenous contrast. COMPARISON:  CT scan of January 16, 2015. FINDINGS: Brain: Mild diffuse cortical atrophy is noted. Mild chronic ischemic white matter disease is noted. No mass effect or midline shift is noted. Ventricular size is within normal limits. There is no evidence of mass lesion, hemorrhage or acute infarction. Vascular: No hyperdense vessel or unexpected calcification. Skull: Normal. Negative for fracture or focal lesion. Sinuses/Orbits: No acute finding. Other: None. IMPRESSION: Mild diffuse cortical atrophy. Mild chronic ischemic white matter disease. No acute intracranial abnormality seen. Electronically Signed   By: Marijo Conception, M.D.   On: 01/10/2018 16:20    Procedures .Lumbar Puncture Date/Time: 01/10/2018 5:47 PM Performed by: Sherwood Gambler, MD Authorized by: Sherwood Gambler, MD   Consent:    Consent obtained:  Verbal and written   Consent given by:  Patient (and sister)   Risks discussed:  Bleeding, headache, nerve damage, infection and pain Pre-procedure details:    Procedure purpose:  Diagnostic   Preparation: Patient was prepped and draped in usual sterile fashion   Anesthesia (see MAR for exact dosages):    Anesthesia method:  Local infiltration   Local anesthetic:  Lidocaine 1% w/o epi Procedure details:    Lumbar space:  L4-L5 interspace   Patient position:  L lateral decubitus   Needle gauge:  20   Number of attempts:  2   Fluid appearance:  Blood-tinged then clearing   Tubes of fluid:  4   Total volume (ml):  4 Post-procedure:    Puncture site:  Adhesive bandage applied   Patient tolerance of procedure:  Tolerated well, no immediate complications  .Critical Care Performed by: Sherwood Gambler, MD Authorized by: Sherwood Gambler, MD   Critical care provider statement:    Critical care time (minutes):  35   Critical care time was  exclusive of:  Separately billable procedures and treating other patients   Critical care was necessary to treat or prevent imminent or life-threatening deterioration of the following conditions:  CNS failure or compromise, sepsis, shock and cardiac failure   Critical care was time spent personally by me on the following activities:  Development of treatment plan with patient or surrogate, discussions with consultants, evaluation of patient's response to treatment, examination of patient, obtaining history from patient or surrogate, ordering and performing treatments and interventions, ordering and review of laboratory studies, ordering and review of radiographic studies, pulse oximetry, re-evaluation of patient's condition and review of old charts   (including critical care time)  Medications Ordered in ED Medications  cefTRIAXone (ROCEPHIN) 2 g in sodium chloride 0.9 % 100 mL IVPB (0 g Intravenous Stopped 01/10/18 1625)  ampicillin (OMNIPEN) 2 g in sodium chloride 0.9 % 100 mL IVPB (has no administration in time range)  vancomycin (VANCOCIN) IVPB 1000 mg/200 mL premix (has no administration in time range)  acyclovir (ZOVIRAX) 805 mg in dextrose 5 % 150 mL IVPB (has no administration in time range)  aspirin tablet 325 mg (has no administration in time range)  ezetimibe (ZETIA) tablet 10 mg (has no administration in time range)  baclofen (LIORESAL) tablet 10 mg (has no administration in time range)  acetaminophen (TYLENOL) tablet 325-650 mg (has no administration in time range)  loratadine (CLARITIN) tablet 10 mg (has no administration in time range)  FLUoxetine (PROZAC)  capsule 40 mg (has no administration in time range)  heparin injection 5,000 Units (has no administration in time range)  0.9 %  sodium chloride infusion (has no administration in time range)  ondansetron (ZOFRAN) tablet 4 mg (has no administration in time range)    Or  ondansetron (ZOFRAN) injection 4 mg (has no administration  in time range)  B-complex with vitamin C tablet 1 tablet (has no administration in time range)  dexamethasone (DECADRON) injection 10 mg (10 mg Intravenous Given 01/10/18 1526)  vancomycin (VANCOCIN) IVPB 1000 mg/200 mL premix (0 mg Intravenous Stopped 01/10/18 1745)  ampicillin (OMNIPEN) 2 g in sodium chloride 0.9 % 100 mL IVPB (0 g Intravenous Stopped 01/10/18 1625)  acetaminophen (TYLENOL) tablet 650 mg (650 mg Oral Given 01/10/18 1524)  acyclovir (ZOVIRAX) 805 mg in dextrose 5 % 150 mL IVPB (0 mg Intravenous Stopped 01/10/18 1745)  sodium chloride 0.9 % bolus 1,000 mL (0 mLs Intravenous Stopped 01/10/18 1625)  sodium chloride 0.9 % bolus 1,000 mL (0 mLs Intravenous Stopped 01/10/18 1933)     Initial Impression / Assessment and Plan / ED Course  I have reviewed the triage vital signs and the nursing notes.  Pertinent labs & imaging results that were available during my care of the patient were reviewed by me and considered in my medical decision making (see chart for details).  Clinical Course as of Jan 11 2215  Mon Jan 10, 2018  1509 With her mental status changes, headache, fever, and no other obvious source on initial exam, I will broadly treat her for possible meningitis.  The Rocephin should help cover her UTI as she had does have relatively frequent UTIs.  Unless another contraindication pops up after CT and initial labs she will likely need LP.   [SG]    Clinical Course User Index [SG] Sherwood Gambler, MD    Patient has no obvious source of infection on initial exam except for the headache and so a lumbar puncture was performed.  However shortly after arrival, antibiotics and Decadron ordered for possible meningitis.  She was also given acyclovir.  CT without acute pathology.  No obvious pneumonia or UTI.  LP performed and was initially mildly bloody that cleared.  However there does not appear to be an obvious meningitis or encephalitis.  She will need her cultures followed.  She also  has new onset atrial fibrillation which has improved and rate control with fluids and Tylenol.  I discussed with hospitalist, Dr. Jonelle Sidle, who will admit.  Ask for MRI with and without contrast as well of the brain.  Final Clinical Impressions(s) / ED Diagnoses   Final diagnoses:  Fever in adult  New onset atrial fibrillation [facility])    ED Discharge Orders    None       Sherwood Gambler, MD 01/10/18 2217    Sherwood Gambler, MD 01/10/18 2218

## 2018-01-10 NOTE — ED Notes (Signed)
Patient transported to CT 

## 2018-01-10 NOTE — ED Notes (Signed)
Purwick placed and warm blanket given to patient.

## 2018-01-10 NOTE — Progress Notes (Signed)
Pt arrived to the floor from the ED. Had a episode of incont.of bowel and bladder. Family at bedside. Oriented to room. Call bell in reach. Bed locked. Tele placed.

## 2018-01-10 NOTE — Progress Notes (Signed)
When Pt gets up to go to the bathroom HR goes up to 140s-150s. We agreed on the [facility] next time because she does not like the purewick.

## 2018-01-10 NOTE — Progress Notes (Signed)
Pharmacy Antibiotic Note  Carly Rhodes is a 72 y.o. female admitted on 01/10/2018 with possible meningitis.  Plan: Amp 2 g q4, ceftriaxone 2 g q12h Vanc 1 g q12h Acyclovir 10 mg/kg q8h Monitor renal fx cx vt prn F/u meningitis w/u  Height: 5\' 5"  (165.1 cm) Weight: 177 lb (80.3 kg) IBW/kg (Calculated) : 57  Temp (24hrs), Avg:102.4 F (39.1 C), Min:102.4 F (39.1 C), Max:102.4 F (39.1 C)  Recent Labs  Lab 01/06/18 1123 01/10/18 1514 01/10/18 1529  WBC 5.8 2.3*  --   CREATININE 0.85 0.82  --   LATICACIDVEN  --   --  1.77    Estimated Creatinine Clearance: 64 mL/min (by C-G formula based on SCr of 0.82 mg/dL).    Allergies  Allergen Reactions  . Other Other (See Comments)    hallucinations   . Procaine Other (See Comments)    REACTION:  Lowers blood pressure, "makes me loopy, can't move or talk"   Levester Fresh, PharmD, BCPS, BCCCP Clinical Pharmacist Clinical phone for 01/10/2018 from 1430 [phone]: [phone] If after 2300, please call main pharmacy at: [phone] 01/10/2018 4:00 PM

## 2018-01-10 NOTE — H&P (Signed)
History and Physical    [place] DJT:[id number] DOB: 06/21/1946 DOA: 01/10/2018  Referring MD/NP/PA: Dr. Verta Ellen  PCP: Noralee Space, MD   Outpatient Specialists: Teressa Lower, MD Pulmonology   Patient coming from: Home  Chief Complaint: Altered mental status  HPI: Carly Rhodes is a 72 y.o. female with medical history significant of recurrent HSV-1 infections, anxiety disorder, chronic pain syndrome on multiple narcotics and benzodiazepines who presented with headache and fever. Patient was seen and evaluated in the ER. She was initially seen by PCP 5 days ago. At that time she was told to come to the ER for evaluation but did not come in. She was given ciprofloxacin at the time for upper respiratory tract infection. In the ER today patient was confused with fever and headache. Suspected meningitis. Lumbar puncture was performed and patient was given Rocephin as well as acyclovir. She is still confused but arousable. Patient has recent UTI also that was treated. No history of dementia. She is now being admitted for workup and treatment.   ED Course: Temperature is 102.4, pulse rate 147, respiratory of 31, blood pressure 93/65, Oxistat 90% on room air. She has a white count of 2.3 with neutropenia, platelets 69, sodium 1:30, potassium 3.3, chloride 98. Patient's creatinine is 0.82. Lumbar puncture performed results are currently pending.  Review of Systems: As per HPI otherwise 10 point review of systems negative.    Past Medical History:  Diagnosis Date  . Anxiety   . Depression   . Family history of anesthesia complication    sister extreme nausea & vomiting  . 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 Osteoporosis   . Seasonal allergies     Past Surgical History:  Procedure Laterality Date  . ABDOMINAL HYSTERECTOMY  1984  . APPENDECTOMY  1984   at time of Hysterectomy  . HAMMER TOE SURGERY  1993   2nd toe left foot  . THYROID LOBECTOMY  09/15/2012   Procedure:  THYROID LOBECTOMY;  Surgeon: Melissa Montane, MD;  Location: [facility];  Service: ENT;  Laterality: Left;  . THYROIDECTOMY, PARTIAL  09/15/2012   Dr Janace Hoard  . TONSILLECTOMY  1957     reports that she has never smoked. She has never used smokeless tobacco. She reports that she does not drink alcohol or use drugs.  Allergies  Allergen Reactions  . Procaine Other (See Comments)    REACTION:  Lowers blood pressure, "makes me loopy, can't move or talk"    Family History  Problem Relation Age of Onset  . Stomach cancer Brother 54  . Colon cancer Paternal Grandmother 9  . Diabetes Father   . Hypertension Sister   . Heart attack Neg Hx     Prior to Admission medications   Medication Sig Start Date End Date Taking? Authorizing Provider  acetaminophen (TYLENOL) 325 MG tablet Take 325-650 mg by mouth every 6 (six) hours as needed for headache (pain).   Yes [provider]  ALPRAZolam (XANAX) 0.5 MG tablet TAKE 1/2 TO 1 (ONE-HALF TO ONE) TABLET BY MOUTH THREE TIMES DAILY AS NEEDED Patient taking differently: TAKE 1/2 TO 1 (ONE-HALF TO ONE) TABLET BY MOUTH THREE TIMES DAILY AS NEEDED FOR ANXIETY 09/02/17  Yes Noralee Space, MD  cetirizine (ZYRTEC) 10 MG tablet Take 10 mg by mouth daily.   Yes [provider]  ezetimibe (ZETIA) 10 MG tablet TAKE 1 TABLET BY MOUTH ONCE DAILY 10/28/17  Yes Noralee Space, MD  acetaminophen-codeine (TYLENOL #  3) 300-30 MG tablet Take one tab at bedtime as needed Patient taking differently: Take 1 tablet by mouth at bedtime as needed (pain).  10/19/17   Thurman Coyer, DO  acyclovir (ZOVIRAX) 400 MG tablet Take 1 tablet (400 mg total) by mouth 3 (three) times daily. As directed by physician 01/03/18   Noralee Space, MD  aspirin 325 MG tablet Take 325 mg by mouth daily. Takes 1/2 tab    [provider]  b complex vitamins tablet Take 1 tablet by mouth daily.    [provider]  baclofen (LIORESAL) 10 MG tablet TAKE 1 TABLET BY MOUTH ONCE  DAILY AT BEDTIME 11/11/17   Noralee Space, MD  cyanocobalamin (,VITAMIN B-12,) 1000 MCG/ML injection Inject 1,000 mcg into the muscle every 30 (thirty) days.    [provider]  FLUoxetine (PROZAC) 40 MG capsule Take 40 mg by mouth daily.    [provider]  MEGARED OMEGA-3 KRILL OIL PO Take by mouth daily.    [provider]  Multiple Vitamins-Minerals (ALIVE WOMENS ENERGY) TABS Take 1 tablet by mouth daily.    [provider]    Physical Exam: Vitals:   01/10/18 1700 01/10/18 1739 01/10/18 1742 01/10/18 1800  BP: 110/69 97/62 97/68  93/65  Pulse: (!) 120 (!) 121 (!) 117 (!) 103  Resp: (!) 24 (!) 31 (!) 21 18  Temp:      TempSrc:      SpO2: 93% 90% 95% 95%  Weight:      Height:          Constitutional: NAD, calm, comfortable Vitals:   01/10/18 1700 01/10/18 1739 01/10/18 1742 01/10/18 1800  BP: 110/69 97/62 97/68  93/65  Pulse: (!) 120 (!) 121 (!) 117 (!) 103  Resp: (!) 24 (!) 31 (!) 21 18  Temp:      TempSrc:      SpO2: 93% 90% 95% 95%  Weight:      Height:       Eyes: PERRL, lids and conjunctivae normal ENMT: Mucous membranes are moist. Posterior pharynx clear of any exudate or lesions.Normal dentition.  Neck: normal, supple, no masses, no thyromegaly Respiratory: clear to auscultation bilaterally, no wheezing, no crackles. Normal respiratory effort. No accessory muscle use.  Cardiovascular: Regular rate and rhythm, no murmurs / rubs / gallops. No extremity edema. 2+ pedal pulses. No carotid bruits.  Abdomen: no tenderness, no masses palpated. No hepatosplenomegaly. Bowel sounds positive.  Musculoskeletal: no clubbing / cyanosis. No joint deformity upper and lower extremities. Good ROM, no contractures. Normal muscle tone.  Skin: no rashes, lesions, ulcers. No induration Neurologic: CN 2-12 grossly intact. Sensation intact, DTR normal. Strength 5/5 in all 4.  Psychiatric: Confused, arousable, .     Labs on Admission: I have  personally reviewed following labs and imaging studies  CBC: Recent Labs  Lab 01/06/18 1123 01/10/18 1514  WBC 5.8 2.3*  NEUTROABS 4.4 1.5*  HGB 14.8 14.0  HCT 43.3 40.7  MCV 83.2 80.0  PLT 189.0 69*   Basic Metabolic Panel: Recent Labs  Lab 01/06/18 1123 01/10/18 1514  NA 135 130*  K 4.1 3.3*  CL 100 98*  CO2 28 21*  GLUCOSE 111* 95  BUN 12 11  CREATININE 0.85 0.82  CALCIUM 9.6 8.1*   GFR: Estimated Creatinine Clearance: 64 mL/min (by C-G formula based on SCr of 0.82 mg/dL). Liver Function Tests: Recent Labs  Lab 01/06/18 1123 01/10/18 1514  AST 23 81*  ALT 19  50  ALKPHOS 78 59  BILITOT 0.9 1.7*  PROT 7.0 5.7*  ALBUMIN 4.1 3.1*   No results for input(s): LIPASE, AMYLASE in the last 168 hours. No results for input(s): AMMONIA in the last 168 hours. Coagulation Profile: Recent Labs  Lab 01/10/18 1514  INR 1.06   Cardiac Enzymes: No results for input(s): CKTOTAL, CKMB, CKMBINDEX, TROPONINI in the last 168 hours. BNP (last 3 results) No results for input(s): PROBNP in the last 8760 hours. HbA1C: No results for input(s): HGBA1C in the last 72 hours. CBG: Recent Labs  Lab 01/10/18 1511  GLUCAP 81   Lipid Profile: No results for input(s): CHOL, HDL, LDLCALC, TRIG, CHOLHDL, LDLDIRECT in the last 72 hours. Thyroid Function Tests: No results for input(s): TSH, T4TOTAL, FREET4, T3FREE, THYROIDAB in the last 72 hours. Anemia Panel: No results for input(s): VITAMINB12, FOLATE, FERRITIN, TIBC, IRON, RETICCTPCT in the last 72 hours. Urine analysis:    Component Value Date/Time   COLORURINE YELLOW 01/10/2018 1721   APPEARANCEUR HAZY (A) 01/10/2018 1721   LABSPEC 1.008 01/10/2018 1721   PHURINE 5.0 01/10/2018 1721   GLUCOSEU NEGATIVE 01/10/2018 1721   GLUCOSEU NEGATIVE 12/23/2017 1242   HGBUR MODERATE (A) 01/10/2018 1721   BILIRUBINUR NEGATIVE 01/10/2018 1721   KETONESUR 20 (A) 01/10/2018 1721   PROTEINUR NEGATIVE 01/10/2018 1721   UROBILINOGEN 0.2  12/23/2017 1242   NITRITE NEGATIVE 01/10/2018 1721   LEUKOCYTESUR NEGATIVE 01/10/2018 1721   Sepsis Labs: @LABRCNTIP (procalcitonin:4,lacticidven:4) )No results found for this or any previous visit (from the past 240 hour(s)).   Radiological Exams on Admission: Dg Chest 2 View  Result Date: 01/10/2018 CLINICAL DATA:  Fever, altered mental status. EXAM: CHEST - 2 VIEW COMPARISON:  None. FINDINGS: Enlarged cardiac silhouette. Calcific atherosclerotic disease of the aorta and tortuosity. Mediastinal contours appear intact. There is no evidence of focal airspace consolidation, pleural effusion or pneumothorax. Low lung volumes with exaggeration of the interstitial markings. Thoracic kyphosis with stable changes of spondylosis of the thoracic spine. Soft tissues are grossly normal. IMPRESSION: No active cardiopulmonary disease. Enlarged heart. Electronically Signed   By: Fidela Salisbury M.D.   On: 01/10/2018 16:04   Ct Head Wo Contrast  Result Date: 01/10/2018 CLINICAL DATA:  Altered level of consciousness. EXAM: CT HEAD WITHOUT CONTRAST TECHNIQUE: Contiguous axial images were obtained from the base of the skull through the vertex without intravenous contrast. COMPARISON:  CT scan of January 16, 2015. FINDINGS: Brain: Mild diffuse cortical atrophy is noted. Mild chronic ischemic white matter disease is noted. No mass effect or midline shift is noted. Ventricular size is within normal limits. There is no evidence of mass lesion, hemorrhage or acute infarction. Vascular: No hyperdense vessel or unexpected calcification. Skull: Normal. Negative for fracture or focal lesion. Sinuses/Orbits: No acute finding. Other: None. IMPRESSION: Mild diffuse cortical atrophy. Mild chronic ischemic white matter disease. No acute intracranial abnormality seen. Electronically Signed   By: Marijo Conception, M.D.   On: 01/10/2018 16:20     Assessment/Plan Principal Problem:   Encephalopathy acute Active Problems:   Herpes  simplex virus (HSV) infection   Dyslipidemia   UTI (urinary tract infection)   Headache    #1 acute Encephalopathy: Suspected viral meningitis. Treated with Rocephin as well as acyclovir at the moment. We'll await for final lumbar puncture results. Admit to stepdown unit. Neurologic evaluation closely. Patient on multiple narcotics and benzodiazepines which could be responsible. We will evaluate and decrease doses. Consider neurology consultation and possible infectious disease when results  are back. On vancomycin and ampicillin and ceftriaxone was acyclovir  #2 chronic recurrent herpes simplex infection: Again we'll continue with acyclovir IV.  #3 persistent headache: May be related to encephalopathy. Continue to monitor.  #4 hyperlipidemia: Continue statin.  #5 recent UTI: Patient currently on Rocephin. Continue treatment.  #6 anxiety disorder: Continue home was any tics.   DVT prophylaxis: heparin   Code Status: Full Code   Family Communication: Sister at bedside   Disposition Plan: To be determined  Consults called: Pls call neurology in am   Admission status: inpatient   Severity of Illness: The appropriate patient status for this patient is INPATIENT. Inpatient status is judged to be reasonable and necessary in order to provide the required intensity of service to ensure the patient's safety. The patient's presenting symptoms, physical exam findings, and initial radiographic and laboratory data in the context of their chronic comorbidities is felt to place them at high risk for further clinical deterioration. Furthermore, it is not anticipated that the patient will be medically stable for discharge from the hospital within 2 midnights of admission. The following factors support the patient status of inpatient.   " The patient's presenting symptoms include confusion and altered mental status. " The worrisome physical exam findings include altered mental status. " The initial  radiographic and laboratory data are worrisome because of negative head CT. " The chronic co-morbidities include chronic herpes simplex infection.   * I certify that at the point of admission it is my clinical judgment that the patient will require inpatient hospital care spanning beyond 2 midnights from the point of admission due to high intensity of service, high risk for further deterioration and high frequency of surveillance required.Barbette Merino MD [facility] Pager [phone]  If 7PM-7AM, please contact night-coverage [email or web address] Password [facility]  01/10/2018, 6:44 PM

## 2018-01-10 NOTE — ED Notes (Signed)
Patient has been laying flat since 1745 post lumbar puncture. She is asleep and resting comfortably. Patient made aware that she may have the head of her bed raised at 1645.

## 2018-01-10 NOTE — ED Notes (Addendum)
Head of bed 20 degrees. Patient tolerating well.

## 2018-01-10 NOTE — ED Notes (Signed)
Patient transported to X-ray 

## 2018-01-10 NOTE — ED Notes (Signed)
Consent for LP obtained, needing MD signature

## 2018-01-11 ENCOUNTER — Inpatient Hospital Stay (HOSPITAL_COMMUNITY): Payer: PPO

## 2018-01-11 DIAGNOSIS — B9620 Unspecified Escherichia coli [E. coli] as the cause of diseases classified elsewhere: Secondary | ICD-10-CM

## 2018-01-11 DIAGNOSIS — M810 Age-related osteoporosis without current pathological fracture: Secondary | ICD-10-CM

## 2018-01-11 DIAGNOSIS — R51 Headache: Secondary | ICD-10-CM

## 2018-01-11 DIAGNOSIS — Z800 Family history of malignant neoplasm of digestive organs: Secondary | ICD-10-CM

## 2018-01-11 DIAGNOSIS — G9340 Encephalopathy, unspecified: Principal | ICD-10-CM

## 2018-01-11 DIAGNOSIS — E785 Hyperlipidemia, unspecified: Secondary | ICD-10-CM

## 2018-01-11 DIAGNOSIS — D696 Thrombocytopenia, unspecified: Secondary | ICD-10-CM

## 2018-01-11 DIAGNOSIS — N390 Urinary tract infection, site not specified: Secondary | ICD-10-CM

## 2018-01-11 DIAGNOSIS — F418 Other specified anxiety disorders: Secondary | ICD-10-CM

## 2018-01-11 DIAGNOSIS — R509 Fever, unspecified: Secondary | ICD-10-CM

## 2018-01-11 DIAGNOSIS — I481 Persistent atrial fibrillation: Secondary | ICD-10-CM

## 2018-01-11 LAB — URINE CULTURE: Culture: NO GROWTH

## 2018-01-11 LAB — CBC
HCT: 43.2 % (ref 36.0–46.0)
Hemoglobin: 14.6 g/dL (ref 12.0–15.0)
MCH: 27.2 pg (ref 26.0–34.0)
MCHC: 33.8 g/dL (ref 30.0–36.0)
MCV: 80.6 fL (ref 78.0–100.0)
PLATELETS: 75 10*3/uL — AB (ref 150–400)
RBC: 5.36 MIL/uL — ABNORMAL HIGH (ref 3.87–5.11)
RDW: 13.5 % (ref 11.5–15.5)
WBC: 4.1 10*3/uL (ref 4.0–10.5)

## 2018-01-11 LAB — COMPREHENSIVE METABOLIC PANEL
ALK PHOS: 56 U/L (ref 38–126)
ALT: 48 U/L (ref 14–54)
ANION GAP: 10 (ref 5–15)
AST: 73 U/L — AB (ref 15–41)
Albumin: 2.8 g/dL — ABNORMAL LOW (ref 3.5–5.0)
BILIRUBIN TOTAL: 1.1 mg/dL (ref 0.3–1.2)
BUN: 9 mg/dL (ref 6–20)
CALCIUM: 7.9 mg/dL — AB (ref 8.9–10.3)
CO2: 21 mmol/L — ABNORMAL LOW (ref 22–32)
CREATININE: 0.64 mg/dL (ref 0.44–1.00)
Chloride: 110 mmol/L (ref 101–111)
GFR calc Af Amer: >60 mL/min (ref >60)
GFR calc non Af Amer: >60 mL/min (ref >60)
GLUCOSE: 143 mg/dL — AB (ref 65–99)
Potassium: 3.5 mmol/L (ref 3.5–5.1)
Sodium: 141 mmol/L (ref 135–145)
TOTAL PROTEIN: 5.5 g/dL — AB (ref 6.5–8.1)

## 2018-01-11 LAB — ECHOCARDIOGRAM COMPLETE
Height: 66.5 in
WEIGHTICAEL: 2814.83 [oz_av]

## 2018-01-11 LAB — SAVE SMEAR

## 2018-01-11 MED ORDER — ACYCLOVIR SODIUM 50 MG/ML IV SOLN
800.0000 mg | Freq: Three times a day (TID) | INTRAVENOUS | Status: DC
  Administered 2018-01-11 – 2018-01-13 (×5): 800 mg via INTRAVENOUS
  Filled 2018-01-11 (×8): qty 16

## 2018-01-11 MED ORDER — METOPROLOL TARTRATE 25 MG PO TABS
25.0000 mg | ORAL_TABLET | Freq: Two times a day (BID) | ORAL | Status: DC
  Administered 2018-01-11 – 2018-01-12 (×3): 25 mg via ORAL
  Filled 2018-01-11 (×3): qty 1

## 2018-01-11 MED ORDER — TRAMADOL HCL 50 MG PO TABS
25.0000 mg | ORAL_TABLET | Freq: Four times a day (QID) | ORAL | Status: DC | PRN
  Administered 2018-01-11 – 2018-01-14 (×7): 25 mg via ORAL
  Filled 2018-01-11 (×7): qty 1

## 2018-01-11 MED ORDER — GADOBENATE DIMEGLUMINE 529 MG/ML IV SOLN
15.0000 mL | Freq: Once | INTRAVENOUS | Status: AC | PRN
  Administered 2018-01-11: 15 mL via INTRAVENOUS

## 2018-01-11 NOTE — Progress Notes (Signed)
EEG Completed; Results Pending  

## 2018-01-11 NOTE — Consult Note (Addendum)
Neurology Consultation Reason for Consult: AMS Referring Physician: Dr. Cruzita Lederer  CC: AMS  History is obtained from: patient  HPI: Carly Rhodes is a 72 y.o. female  With PMH significant for chronic pain syndrome on multiple narcotics and benzodiazepines, recurrent HSV-1 infections. Patient was admitted 5/27 for AMS and HA.  Patient states that since Tuesday 5/21 she has been sick. On Tuesday she had chills and went to her PCP. Her PCP told her she had a virus and sent her home without medication. On Thursday she reports that she felt worse and a HA developed. She described the headache as throbbing pain that started in the posteriorly and progressed to the entire head. Tylenol was tried and ineffective at reliving symptoms. She called her PCP to ask for pain medication and he told her that she should be seen in the ER. Patient did not follow advice and come to ED at this time. She then states that on Sunday she fell because she felt weak. She had also not been eating or drinking since Thursday. Denies LOC, HA, or hitting her head during this fall. She remained on the floor for >1hr until she felt strong enough to get up off the floor. Her husband was unable to assist her off the floor. That night she states she took 1/2 of a codeine pill to help her sleep. Denies having pain at that time. The next morning when she woke up she was having difficulty with speech. She could understand what people were saying to her, but could not speak. Her sister told her husband to call EMS and have her brought into the hospital. Patient states that HA has resolved.   Patient was febrile on arrival to ED. AMS continued along with  HA. LP negative. CT negative and MRI negative for any acute intracranial abnormalities . Acyclovir, Ceftriaxone, and Ampicillin started. Neurology consulted for AMS and HA..   ROS: A 14 point ROS was performed and is negative except as noted in the HPI.   Past Medical History:   Diagnosis Date  . Anxiety   . Depression   . Family history of anesthesia complication    sister extreme nausea & vomiting  . 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 Osteoporosis   . Seasonal allergies      Family History  Problem Relation Age of Onset  . Stomach cancer Brother 40  . Colon cancer Paternal Grandmother 58  . Diabetes Father   . Hypertension Sister   . Heart attack Neg Hx      Social History:  reports that she has never smoked. She has never used smokeless tobacco. She reports that she does not drink alcohol or use drugs.   Exam: Current vital signs: BP 109/73 (BP Location: Left Arm)   Pulse (!) 112   Temp 97.8 F (36.6 C) (Oral)   Resp 17   Ht 5' 6.5" (1.689 m)   Wt 79.8 kg (175 lb 14.8 oz)   SpO2 100%   BMI 27.97 kg/m  Vital signs in last 24 hours: Temp:  [97.6 F (36.4 C)-102.4 F (39.1 C)] 97.8 F (36.6 C) (05/28 0458) Pulse Rate:  [69-147] 112 (05/28 0755) Resp:  [16-31] 17 (05/28 0755) BP: (93-120)/(57-83) 109/73 (05/28 0755) SpO2:  [90 %-100 %] 100 % (05/28 0755) Weight:  [79.8 kg (175 lb 14.8 oz)-80.3 kg (177 lb)] 79.8 kg (175 lb 14.8 oz) (05/27 2029)   Physical Exam  Constitutional: Appears well-developed and well-nourished.  Psych: Affect  appropriate to situation Eyes: normal sclera and conjuctiva HENT: fever blister on lip, no blistering in throat noted Head: Normocephalic.  Cardiovascular: Normal rate and regular rhythm.  Respiratory: Effort normal, non-labored breathing GI: Soft.  No distension. There is no tenderness. BS present in all 4 quadrants  Skin: WDI; except for fever blister on lower lip.  Neuro: Mental Status: Patient is awake, alert, oriented to person, place, month, year, and situation. Patient is able to give a history, but still shows signs of AMS. No signs of aphasia or neglect. Cranial Nerves: II: Visual Fields normal. Pupils are equal, round, and reactive to light.   III,IV, VI: EOMI without ptosis or  diploplia.  V: Facial sensation is symmetric to temperature and light touch VII: Facial movement is symmetric.  VIII: hearing is intact to voice X: Uvula elevates symmetrically XI: Shoulder shrug is symmetric. XII: tongue is midline without atrophy or fasciculations.  Motor: Tone is normal. Bulk is normal. Moves all extremities well, but gives poor effort.  Sensory: Sensation is symmetric to   light touch and temperature in the face,arms and legs  Deep Tendon Reflexes: 2+ and symmetric in the biceps and patellae.  Plantars: Toes are downgoing bilaterally.  Cerebellar: FNF and HKS are intact bilaterally      I have reviewed labs in epic and the results pertinent to this consultation are: BG: 143 HSV: pending Blood Cx: pending Sed rate: 7 on 5/23 CBC: platelets 75    I have reviewed the images obtained:  MRI Head: no acute intracranial abnormality Chronic ischemic microangiopathy   CT Head: No acute intracranial abnormality Mild chronic ischemic white matter disease Mild diffuse cortical atrophy   Laurey Morale, MSN, NP-C [facility] [phone]   Impression:  72 yo F presenting with AMS and HA. The description of her AMS sounds like it could be aphasia as opposed to other types of delirium or encephalopathy, but without examining her during the episode, difficult to say.  It is possible that this is all due to a viral syndrome with delirium, but I would favro checking an EEG given a question of focal deficit. With the duration of the symptoms and negative MRI, I think stroke/TIA is unlikely.   One other consideration given fever with normal ESR, neurological symptoms, and thrombocyotpenia would be TTP and I would favor urgent discussion with hematology. This was communicated to the primary team.   Recommendations: 1) Continue acyclovir until HSV results 2) EEG 3) Hematology consult  Roland Rack, MD [facility] [phone]  If 7pm- 7am, please page neurology on call as listed in [place].

## 2018-01-11 NOTE — Progress Notes (Signed)
Attending MD note  Patient was seen, examined,treatment plan was discussed with the PA-S.  I have personally reviewed the clinical findings, lab, imaging studies and management of this patient in detail. I agree with the documentation, as recorded by the PA-S  Patient is 72 year old with history of anxiety, depression, recurrent HSV-1 cold sores admitted on 5/27 for altered mental status, was found to be febrile and was admitted to the hospital, placed on broad-spectrum antibiotics to cover for meningitis and underwent an LP.  She was also found to have new onset A. fib with RVR.  BP 100/68 (BP Location: Left Arm)   Pulse (!) 105   Temp 98 F (36.7 C) (Oral)   Resp 18   Ht 5' 6.5" (1.689 m)   Wt 79.8 kg (175 lb 14.8 oz)   SpO2 96%   BMI 27.97 kg/m  On Exam: Gen. exam: Awake, alert, not in any distress Chest: Good air entry bilaterally, no rhonchi or rales CVS: Irregular, no murmurs heard Abdomen: Soft, nontender and nondistended Neurology: Non-focal Skin: No rash or lesions  Plan  Acute encephalopathy -Unclear etiology, concern for meningitis versus TTP given thrombocytopenia -LP results reviewed, no evidence for bacterial meningitis, Gram stain negative, 2 white blood cells (somewhat of a traumatic LP with elevated RBCs), normal glucose and normal total protein.  Stop antibacterials and continue antivirals until HSV DNA is back -MRI of the brain without acute CVA  Atrial fibrillation, new onset -patient's CHA2DS2-VASc Score for Stroke Risk is > 2 -Cardiology consulted, appreciate input  Hyperlipidemia -Continue home medications  Thrombocytopenia -discussed with Dr. Alvy Bimler, unlikely TTP, smear without schistocytes and appears normal  -discontinue vancomycin as above    Rest as above  Carly Ruggles M. Cruzita Lederer, MD [facility] [phone]  If 7PM-7AM, please contact night-coverage [email or web address] Password [email or web address]     PROGRESS NOTE  Carly Rhodes  CHE:[id number] DOB: [birth year] DOA: 01/10/2018 PCP: Noralee Space, MD   LOS: 1 day   Brief Narrative / Interim history: Carly Rhodes is a 72 y.o. female with history of anxiety, depression, and HSV cold sores admitted to the hospital 5/27 for acute encephalopathy, headache, and fever. She was brought in by EMS and was reported to have a new onset of Atrial Fibrillation. She has little memory of being brought in to the ED. She has no history of afib or other heart conditions.   Assessment & Plan: Principal Problem:   Encephalopathy acute Active Problems:   Herpes simplex virus (HSV) infection   Dyslipidemia   UTI (urinary tract infection)   Headache   Acute Encephalopathy -Status is improving, continue to monitor -Working diagnosis is viral meningitis versus TTP. Neurology and hematology consulted. Results pending on PCR analysis of CSF and peripheral blood smear -Continue Acyclovir until HSV results, discontinue antibiotics  Atrial Fibrillation -Patient currently asymptomatic, continue cardiac monitor -Given new onset of Afib, cardiology consulted. CHADS-VAsC score=2, consider anticoagulation on discharge. Currently on aspirin 325mg  QD.  -2D echo ordered and pending  HSV Infection -Pt currently has cold sore on lower lip, continue acyclovir and monitor  Hyperlipidemia -Continue Ezetimibe 10mg  daily  Headache -Pt no longer experiencing headache, continue to monitor  Thrombocytopenia -Hematology consulted and peripheral smear pending for suspected TTP    DVT prophylaxis: SCDs Code Status: Full Code Family Communication: N/A Disposition Plan: Home when improved  Consultants:   Neurology  Hematology  Cardiology  Procedures:   2D echo: Results pending  Antimicrobials:  Ampicillin   Ceftriaxone  Vancomycin  Subjective:  She complains of body weakness and fatigue. She reports today no fever, chills, headache, shortness of breath, chest pain, palpitations,  nausea, vomiting, or urinary symptoms.   Objective: Vitals:   01/10/18 2029 01/11/18 0003 01/11/18 0458 01/11/18 0755  BP: 111/69 106/66 (!) 105/57 109/73  Pulse: 98 69 98 (!) 112  Resp: 16 18 18 17   Temp: 97.7 F (36.5 C) 97.7 F (36.5 C) 97.8 F (36.6 C)   TempSrc: Oral Oral Oral   SpO2: 94% 100% 98% 100%  Weight: 79.8 kg (175 lb 14.8 oz)     Height: 5' 6.5" (1.689 m)       Intake/Output Summary (Last 24 hours) at 01/11/2018 1100 Last data filed at 01/11/2018 0600 Gross per 24 hour  Intake 1241.1 ml  Output 1150 ml  Net 91.1 ml   Filed Weights   01/10/18 1457 01/10/18 2029  Weight: 80.3 kg (177 lb) 79.8 kg (175 lb 14.8 oz)    Examination:  Constitutional: NAD Eyes: lids and conjunctivae normal Respiratory: clear to auscultation bilaterally, no wheezing, no crackles. Normal respiratory effort. No accessory muscle use.  Cardiovascular: Irregular rate and irregular rhythm, no murmurs / rubs / gallops. Radial pulses irregular. No LE edema. 2+ pedal pulses.  Abdomen: no tenderness. Bowel sounds positive.  Musculoskeletal: no clubbing / cyanosis. No joint deformity upper and lower extremities. No contractures. Normal muscle tone.  Skin: no rashes, lesions, ulcers. No induration Neurologic: CN 2-12 grossly intact. Strength 5/5 in all 4.  Psychiatric: Lethargic. Normal judgment and insight. Alert and oriented x 3. Normal mood.    Data Reviewed: I have independently reviewed following labs and imaging studies.  CBC: Recent Labs  Lab 01/06/18 1123 01/10/18 1514 01/11/18 0610  WBC 5.8 2.3* 4.1  NEUTROABS 4.4 1.5*  --   HGB 14.8 14.0 14.6  HCT 43.3 40.7 43.2  MCV 83.2 80.0 80.6  PLT 189.0 69* 75*   Basic Metabolic Panel: Recent Labs  Lab 01/06/18 1123 01/10/18 1514 01/11/18 0610  NA 135 130* 141  K 4.1 3.3* 3.5  CL 100 98* 110  CO2 28 21* 21*  GLUCOSE 111* 95 143*  BUN 12 11 9   CREATININE 0.85 0.82 0.64  CALCIUM 9.6 8.1* 7.9*   GFR: Estimated Creatinine  Clearance: 67.4 mL/min (by C-G formula based on SCr of 0.64 mg/dL). Liver Function Tests: Recent Labs  Lab 01/06/18 1123 01/10/18 1514 01/11/18 0610  AST 23 81* 73*  ALT 19 50 48  ALKPHOS 78 59 56  BILITOT 0.9 1.7* 1.1  PROT 7.0 5.7* 5.5*  ALBUMIN 4.1 3.1* 2.8*   No results for input(s): LIPASE, AMYLASE in the last 168 hours. No results for input(s): AMMONIA in the last 168 hours. Coagulation Profile: Recent Labs  Lab 01/10/18 1514  INR 1.06   Cardiac Enzymes: No results for input(s): CKTOTAL, CKMB, CKMBINDEX, TROPONINI in the last 168 hours. BNP (last 3 results) No results for input(s): PROBNP in the last 8760 hours. HbA1C: No results for input(s): HGBA1C in the last 72 hours. CBG: Recent Labs  Lab 01/10/18 1511  GLUCAP 81   Lipid Profile: No results for input(s): CHOL, HDL, LDLCALC, TRIG, CHOLHDL, LDLDIRECT in the last 72 hours. Thyroid Function Tests: No results for input(s): TSH, T4TOTAL, FREET4, T3FREE, THYROIDAB in the last 72 hours. Anemia Panel: No results for input(s): VITAMINB12, FOLATE, FERRITIN, TIBC, IRON, RETICCTPCT in the last 72 hours. Urine analysis:    Component Value Date/Time   COLORURINE YELLOW 01/10/2018 1721   APPEARANCEUR HAZY (  A) 01/10/2018 1721   LABSPEC 1.008 01/10/2018 1721   PHURINE 5.0 01/10/2018 1721   GLUCOSEU NEGATIVE 01/10/2018 1721   GLUCOSEU NEGATIVE 12/23/2017 1242   HGBUR MODERATE (A) 01/10/2018 1721   BILIRUBINUR NEGATIVE 01/10/2018 1721   KETONESUR 20 (A) 01/10/2018 1721   PROTEINUR NEGATIVE 01/10/2018 1721   UROBILINOGEN 0.2 12/23/2017 1242   NITRITE NEGATIVE 01/10/2018 1721   LEUKOCYTESUR NEGATIVE 01/10/2018 1721   Sepsis Labs: Invalid input(s): PROCALCITONIN, LACTICIDVEN  Recent Results (from the past 240 hour(s))  CSF culture     Status: None (Preliminary result)   Collection Time: 01/10/18  5:41 PM  Result Value Ref Range Status   Specimen Description CSF  Final   Special Requests NONE  Final   Gram Stain    Final    CYTOSPIN SMEAR WBC PRESENT, PREDOMINANTLY MONONUCLEAR NO ORGANISMS SEEN    Culture   Final    NO GROWTH < 12 HOURS Performed at [facility], [address], [place], [place] [address]    Report Status PENDING  Incomplete      Radiology Studies: Dg Chest 2 View  Result Date: 01/10/2018 CLINICAL DATA:  Fever, altered mental status. EXAM: CHEST - 2 VIEW COMPARISON:  None. FINDINGS: Enlarged cardiac silhouette. Calcific atherosclerotic disease of the aorta and tortuosity. Mediastinal contours appear intact. There is no evidence of focal airspace consolidation, pleural effusion or pneumothorax. Low lung volumes with exaggeration of the interstitial markings. Thoracic kyphosis with stable changes of spondylosis of the thoracic spine. Soft tissues are grossly normal. IMPRESSION: No active cardiopulmonary disease. Enlarged heart. Electronically Signed   By: Fidela Salisbury M.D.   On: 01/10/2018 16:04   Ct Head Wo Contrast  Result Date: 01/10/2018 CLINICAL DATA:  Altered level of consciousness. EXAM: CT HEAD WITHOUT CONTRAST TECHNIQUE: Contiguous axial images were obtained from the base of the skull through the vertex without intravenous contrast. COMPARISON:  CT scan of January 16, 2015. FINDINGS: Brain: Mild diffuse cortical atrophy is noted. Mild chronic ischemic white matter disease is noted. No mass effect or midline shift is noted. Ventricular size is within normal limits. There is no evidence of mass lesion, hemorrhage or acute infarction. Vascular: No hyperdense vessel or unexpected calcification. Skull: Normal. Negative for fracture or focal lesion. Sinuses/Orbits: No acute finding. Other: None. IMPRESSION: Mild diffuse cortical atrophy. Mild chronic ischemic white matter disease. No acute intracranial abnormality seen. Electronically Signed   By: Marijo Conception, M.D.   On: 01/10/2018 16:20   Mr Brain W And Wo Contrast  Result Date: 01/11/2018 CLINICAL DATA:  Altered mental  status EXAM: MRI HEAD WITHOUT AND WITH CONTRAST TECHNIQUE: Multiplanar, multiecho pulse sequences of the brain and surrounding structures were obtained without and with intravenous contrast. CONTRAST:  22mL MULTIHANCE GADOBENATE DIMEGLUMINE 529 MG/ML IV SOLN COMPARISON:  Head CT 01/10/2018 FINDINGS: BRAIN: The midline structures are normal. There is no acute infarct or acute hemorrhage. There is no mass lesion or other mass effect. There is no hydrocephalus, dural abnormality or extra-axial collection. Multifocal white matter hyperintensity, most commonly due to chronic ischemic microangiopathy. No age-advanced or lobar predominant atrophy. No chronic microhemorrhage or superficial siderosis. VASCULAR: Major intracranial arterial and venous sinus flow voids are preserved. SKULL AND UPPER CERVICAL SPINE: The visualized skull base, calvarium, upper cervical spine and extracranial soft tissues are normal. SINUSES/ORBITS: No fluid levels or advanced mucosal thickening. No mastoid or middle ear effusion. The orbits are normal. 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 Electronically Signed  By: Ulyses Jarred M.D.   On: 01/11/2018 04:43     Scheduled Meds: . aspirin  325 mg Oral Daily  . B-complex with vitamin C  1 tablet Oral Daily  . baclofen  10 mg Oral QHS  . ezetimibe  10 mg Oral Daily  . FLUoxetine  40 mg Oral Daily  . heparin  5,000 Units Subcutaneous Q8H  . loratadine  10 mg Oral Daily   Continuous Infusions: . sodium chloride 100 mL/hr at 01/10/18 2227  . acyclovir    . ampicillin (OMNIPEN) IV 2 g (01/11/18 0907)  . cefTRIAXone (ROCEPHIN)  IV Stopped (01/11/18 7014)  . vancomycin Stopped (01/11/18 0859)     Carita Pian, PA-S  If 7PM-7AM, please contact night-coverage [email or web address] Password [email or web address] 01/11/2018, 11:00 AM   [email or web address]

## 2018-01-11 NOTE — Progress Notes (Signed)
Pt going down for MRI. 
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
## 2018-01-11 NOTE — Progress Notes (Signed)
Pt returned from MRI °

## 2018-01-11 NOTE — Progress Notes (Signed)
   01/11/18 2342  Vitals  Temp 98.4 F (36.9 C)  Temp Source Oral  BP (!) 86/59  MAP (mmHg) 65  BP Location Left Arm  BP Method Automatic  Patient Position (if appropriate) Lying  Pulse Rate 73  Resp 18   Txt paged Tylene Fantasia. Pt also wanted all her meds at one time. See MAR. No other symptoms.

## 2018-01-11 NOTE — Progress Notes (Signed)
[facility] NOTE  Patient Care Team: Noralee Space, MD as PCP - General (Pulmonary Disease)  CHIEF COMPLAINTS/PURPOSE OF CONSULTATION:  Acute thrombocytopenia  HISTORY OF PRESENTING ILLNESS:  Carly Rhodes 72 y.o. female is seen per request because of acute thrombocytopenia.  She was found to have abnormal CBC from blood count monitoring since admission. On 01/06/2018, CBC was normal with white count of 5.8, hemoglobin 14.8 and platelet count of 189 Over the last few days, her her blood counts changed Her platelet count dropped to 69,000 but rebounded back to 75,000 this morning. She was admitted for work-up for possible meningitis causing severe headache. Her urine cultures was abnormal. Urine culture from 12/23/2017 showed E. Coli. CT scan of the head is unremarkable MRI was negative The patient was placed on broad-spectrum antibiotics including vancomycin since admission. CSF culture is pending  She denies recent bruising/bleeding, such as spontaneous epistaxis, hematuria, melena or hematochezia The patient denies history of liver disease  She has been complaining of severe headache for 4 days.  Her headache is better but now she is complaining of back pain due to lying down in the hospital bed. Her appetite was stable up until recent admission She denies cough She has documented fever  MEDICAL HISTORY:  Past Medical History:  Diagnosis Date  . Anxiety   . Depression   . Family history of anesthesia complication    sister extreme nausea & vomiting  . 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 Osteoporosis   . Seasonal allergies     SURGICAL HISTORY: Past Surgical History:  Procedure Laterality Date  . ABDOMINAL HYSTERECTOMY  1984  . APPENDECTOMY  1984   at time of Hysterectomy  . HAMMER TOE SURGERY  1993   2nd toe left foot  . THYROID LOBECTOMY  09/15/2012   Procedure: THYROID LOBECTOMY;  Surgeon: Melissa Montane, MD;  Location: [facility];  Service:  ENT;  Laterality: Left;  . THYROIDECTOMY, PARTIAL  09/15/2012   Dr Janace Hoard  . TONSILLECTOMY  1957    SOCIAL HISTORY: Social History   Socioeconomic History  . Marital status: Divorced    Spouse name: Not on file  . Number of children: Not on file  . Years of education: Not on file  . Highest education level: Not on file  Occupational History  . Not on file  Social Needs  . Financial resource strain: Not on file  . Food insecurity:    Worry: Not on file    Inability: Not on file  . Transportation needs:    Medical: Not on file    Non-medical: Not on file  Tobacco Use  . Smoking status: Never Smoker  . Smokeless tobacco: Never Used  Substance and Sexual Activity  . Alcohol use: No  . Drug use: No  . Sexual activity: Not Currently    Birth control/protection: Post-menopausal, Surgical  Lifestyle  . Physical activity:    Days per week: Not on file    Minutes per session: Not on file  . Stress: Not on file  Relationships  . Social connections:    Talks on phone: Not on file    Gets together: Not on file    Attends religious service: Not on file    Active member of club or organization: Not on file    Attends meetings of clubs or organizations: Not on file    Relationship status: Not on file  . Intimate partner violence:    Fear  of current or ex partner: Not on file    Emotionally abused: Not on file    Physically abused: Not on file    Forced sexual activity: Not on file  Other Topics Concern  . Not on file  Social History Narrative  . Not on file    FAMILY HISTORY: Family History  Problem Relation Age of Onset  . Stomach cancer Brother 40  . Colon cancer Paternal Grandmother 40  . Diabetes Father   . Hypertension Sister   . Heart attack Neg Hx     ALLERGIES:  is allergic to procaine.  MEDICATIONS:  Current Facility-Administered Medications  Medication Dose Route Frequency Provider Last Rate Last Dose  . 0.9 %  sodium chloride infusion   Intravenous  Continuous Elwyn Reach, MD 100 mL/hr at 01/10/18 2227    . acetaminophen (TYLENOL) tablet 325-650 mg  325-650 mg Oral Q6H PRN Elwyn Reach, MD      . acyclovir (ZOVIRAX) 800 mg in dextrose 5 % 150 mL IVPB  800 mg Intravenous Q8H Rolla Flatten, [facility]      . ampicillin (OMNIPEN) 2 g in sodium chloride 0.9 % 100 mL IVPB  2 g Intravenous Q4H Gala Romney L, MD 300 mL/hr at 01/11/18 0907 2 g at 01/11/18 0907  . aspirin tablet 325 mg  325 mg Oral Daily Elwyn Reach, MD   325 mg at 01/11/18 3235  . B-complex with vitamin C tablet 1 tablet  1 tablet Oral Daily Elwyn Reach, MD   1 tablet at 01/11/18 0907  . baclofen (LIORESAL) tablet 10 mg  10 mg Oral QHS Elwyn Reach, MD   10 mg at 01/10/18 2228  . cefTRIAXone (ROCEPHIN) 2 g in sodium chloride 0.9 % 100 mL IVPB  2 g Intravenous Q12H Elwyn Reach, MD   Stopped at 01/11/18 [phone]  . ezetimibe (ZETIA) tablet 10 mg  10 mg Oral Daily Elwyn Reach, MD   10 mg at 01/11/18 0907  . FLUoxetine (PROZAC) capsule 40 mg  40 mg Oral Daily Elwyn Reach, MD   40 mg at 01/11/18 0907  . loratadine (CLARITIN) tablet 10 mg  10 mg Oral Daily Elwyn Reach, MD   10 mg at 01/11/18 0907  . ondansetron (ZOFRAN) tablet 4 mg  4 mg Oral Q6H PRN Elwyn Reach, MD       Or  . ondansetron (ZOFRAN) injection 4 mg  4 mg Intravenous Q6H PRN Elwyn Reach, MD      . vancomycin (VANCOCIN) IVPB 1000 mg/200 mL premix  1,000 mg Intravenous Q12H Elwyn Reach, MD   Stopped at 01/11/18 [phone]    REVIEW OF SYSTEMS:   Constitutional: Denies fevers, chills or abnormal night sweats Eyes: Denies blurriness of vision, double vision or watery eyes Ears, nose, mouth, throat, and face: Denies mucositis or sore throat Respiratory: Denies cough, dyspnea or wheezes Cardiovascular: Denies palpitation, chest discomfort or lower extremity swelling Gastrointestinal:  Denies nausea, heartburn or change in bowel habits Skin: Denies abnormal skin  rashes Lymphatics: Denies new lymphadenopathy or easy bruising Neurological:Denies numbness, tingling or new weaknesses Behavioral/Psych: Mood is stable, no new changes  All other systems were reviewed with the patient and are negative.  PHYSICAL EXAMINATION: ECOG PERFORMANCE STATUS: 1 - Symptomatic but completely ambulatory  Vitals:   01/11/18 0458 01/11/18 0755  BP: (!) 105/57 109/73  Pulse: 98 (!) 112  Resp: 18 17  Temp: 97.8 F (36.6 C)  SpO2: 98% 100%   Filed Weights   01/10/18 1457 01/10/18 2029  Weight: 177 lb (80.3 kg) 175 lb 14.8 oz (79.8 kg)    GENERAL:alert, no distress but appears uncomfortable, complaining of back pain SKIN: skin color, texture, turgor are normal, no rashes or significant lesions EYES: normal, conjunctiva are pink and non-injected, sclera clear OROPHARYNX:no exudate, no erythema and lips, buccal mucosa, and tongue normal  NECK: supple, thyroid normal size, non-tender, without nodularity LYMPH:  no palpable lymphadenopathy in the cervical, axillary or inguinal LUNGS: clear to auscultation and percussion with normal breathing effort HEART: regular rate & rhythm and no murmurs and no lower extremity edema ABDOMEN:abdomen soft, non-tender and normal bowel sounds Musculoskeletal:no cyanosis of digits and no clubbing  PSYCH: alert & oriented x 3 with fluent speech NEURO: no focal motor/sensory deficits  LABORATORY DATA:  I have reviewed the data as listed Lab Results  Component Value Date   WBC 4.1 01/11/2018   HGB 14.6 01/11/2018   HCT 43.2 01/11/2018   MCV 80.6 01/11/2018   PLT 75 (L) 01/11/2018   I have personally review her blood smear. Occasional platelet clumping is noted.  There are no schistocytes.  WBC of normal morphology.  RADIOGRAPHIC STUDIES: I have personally reviewed the radiological images as listed and agreed with the findings in the report. Dg Chest 2 View  Result Date: 01/10/2018 CLINICAL DATA:  Fever, altered mental  status. EXAM: CHEST - 2 VIEW COMPARISON:  None. FINDINGS: Enlarged cardiac silhouette. Calcific atherosclerotic disease of the aorta and tortuosity. Mediastinal contours appear intact. There is no evidence of focal airspace consolidation, pleural effusion or pneumothorax. Low lung volumes with exaggeration of the interstitial markings. Thoracic kyphosis with stable changes of spondylosis of the thoracic spine. Soft tissues are grossly normal. IMPRESSION: No active cardiopulmonary disease. Enlarged heart. Electronically Signed   By: Fidela Salisbury M.D.   On: 01/10/2018 16:04   Ct Head Wo Contrast  Result Date: 01/10/2018 CLINICAL DATA:  Altered level of consciousness. EXAM: CT HEAD WITHOUT CONTRAST TECHNIQUE: Contiguous axial images were obtained from the base of the skull through the vertex without intravenous contrast. COMPARISON:  CT scan of January 16, 2015. FINDINGS: Brain: Mild diffuse cortical atrophy is noted. Mild chronic ischemic white matter disease is noted. No mass effect or midline shift is noted. Ventricular size is within normal limits. There is no evidence of mass lesion, hemorrhage or acute infarction. Vascular: No hyperdense vessel or unexpected calcification. Skull: Normal. Negative for fracture or focal lesion. Sinuses/Orbits: No acute finding. Other: None. IMPRESSION: Mild diffuse cortical atrophy. Mild chronic ischemic white matter disease. No acute intracranial abnormality seen. Electronically Signed   By: Marijo Conception, M.D.   On: 01/10/2018 16:20   Mr Brain W And Wo Contrast  Result Date: 01/11/2018 CLINICAL DATA:  Altered mental status EXAM: MRI HEAD WITHOUT AND WITH CONTRAST TECHNIQUE: Multiplanar, multiecho pulse sequences of the brain and surrounding structures were obtained without and with intravenous contrast. CONTRAST:  34mL MULTIHANCE GADOBENATE DIMEGLUMINE 529 MG/ML IV SOLN COMPARISON:  Head CT 01/10/2018 FINDINGS: BRAIN: The midline structures are normal. There is no  acute infarct or acute hemorrhage. There is no mass lesion or other mass effect. There is no hydrocephalus, dural abnormality or extra-axial collection. Multifocal white matter hyperintensity, most commonly due to chronic ischemic microangiopathy. No age-advanced or lobar predominant atrophy. No chronic microhemorrhage or superficial siderosis. VASCULAR: Major intracranial arterial and venous sinus flow voids are preserved. SKULL AND UPPER CERVICAL SPINE: The  visualized skull base, calvarium, upper cervical spine and extracranial soft tissues are normal. SINUSES/ORBITS: No fluid levels or advanced mucosal thickening. No mastoid or middle ear effusion. The orbits are normal. 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 Electronically Signed   By: Ulyses Jarred M.D.   On: 01/11/2018 04:43    ASSESSMENT & PLAN  Acute thrombocytopenia This is likely due to antibiotic therapy, specifically due to vancomycin Heparin-induced thrombocytopenia is also a possibility This is not TTP or ITP I have discontinued heparin and put her on elastic compression hose I have discussed the case with the hospitalist to consider stopping IV vancomycin  UTI She has been treated with ceftriaxone (previously treated with ciprofloxacin by her primary care doctor)  Severe headache Cause is unknown.  Defer to neurology She does not have altered mental status in my opinion There is nothing in her case at this point to suggest TTP  Back pain According to the patient, this is due to lying down in her bed She has been prescribed Tylenol 3 as needed  Discharge planning Hopefully she can be discharged within the next 24 to 48 hours I will continue to follow   All questions were answered. The patient knows to call the clinic with any problems, questions or concerns. No barriers to learning was detected.     Heath Lark, MD 01/11/2018 11:27 AM

## 2018-01-11 NOTE — Procedures (Signed)
HPI:  72 y/o with MS change  TECHNICAL SUMMARY:  A multichannel referential and bipolar montage EEG using the standard international 10-20 system was performed on the patient.  The dominant background activity consists of 8-8.5 hertz activity seen most prominantly over the posterior head region.  The backgound activity is reactive to eye opening and closing procedures.  Low voltage fast (beta) activity is distributed symmetrically and maximally over the anterior head regions.  There is much myogenic artifact of the frontal head regions.  ACTIVATION:  Stepwise photic stimulation and hyperventilation are not performed.  EPILEPTIFORM ACTIVITY:  There were no spikes, sharp waves or paroxysmal activity.  SLEEP: No sleep is noted.  CARDIAC:  The EKG lead revealed a irregular rhythm.    IMPRESSION:  This is a normal EEG for the patients stated age.  There were no focal, hemispheric or lateralizing features.  No epileptiform activity was recorded.  A normal EEG does not exclude the diagnosis of a seizure disorder and if seizure remains high on the list of differential diagnosis, an ambulatory EEG may be of value.  Clinical correlation is required.

## 2018-01-12 ENCOUNTER — Inpatient Hospital Stay (HOSPITAL_COMMUNITY): Payer: PPO

## 2018-01-12 DIAGNOSIS — H8302 Labyrinthitis, left ear: Secondary | ICD-10-CM

## 2018-01-12 DIAGNOSIS — I4891 Unspecified atrial fibrillation: Secondary | ICD-10-CM

## 2018-01-12 LAB — CBC WITH DIFFERENTIAL/PLATELET
BASOS PCT: 1 %
Basophils Absolute: 0.1 10*3/uL (ref 0.0–0.1)
EOS ABS: 0 10*3/uL (ref 0.0–0.7)
EOS PCT: 0 %
HEMATOCRIT: 38.6 % (ref 36.0–46.0)
Hemoglobin: 13.2 g/dL (ref 12.0–15.0)
LYMPHS PCT: 28 %
Lymphs Abs: 1.6 10*3/uL (ref 0.7–4.0)
MCH: 27.6 pg (ref 26.0–34.0)
MCHC: 34.2 g/dL (ref 30.0–36.0)
MCV: 80.6 fL (ref 78.0–100.0)
Monocytes Absolute: 0.2 10*3/uL (ref 0.1–1.0)
Monocytes Relative: 3 %
NEUTROS ABS: 3.9 10*3/uL (ref 1.7–7.7)
Neutrophils Relative %: 68 %
PLATELETS: 62 10*3/uL — AB (ref 150–400)
RBC: 4.79 MIL/uL (ref 3.87–5.11)
RDW: 14 % (ref 11.5–15.5)
WBC: 5.8 10*3/uL (ref 4.0–10.5)

## 2018-01-12 LAB — HERPES SIMPLEX VIRUS(HSV) DNA BY PCR
HSV 1 DNA: NEGATIVE
HSV 2 DNA: NEGATIVE

## 2018-01-12 MED ORDER — METOPROLOL TARTRATE 12.5 MG HALF TABLET: 12.5000 mg | ORAL_TABLET | Freq: Two times a day (BID) | ORAL | Status: DC

## 2018-01-12 NOTE — Progress Notes (Signed)
Pt c/o of nausea. Will given PRN meds. Paging Tylene Fantasia because Pt now has nystagmus as a new onset. VSS.

## 2018-01-12 NOTE — Progress Notes (Signed)
Pt stated she was seeing smoke in her room. C/o being nauseated. Stated she was okay. Took BP manually per Tylene Fantasia. Will continue to monitor.

## 2018-01-12 NOTE — Progress Notes (Signed)
Attending MD note  Patient was seen, examined,treatment plan was discussed with the PA-S.  I have personally reviewed the clinical findings, lab, imaging studies and management of this patient in detail. 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  She was initially placed on broad-spectrum antibiotics to cover for meningitis however these were discontinued once the LP was negative.  She has been maintained on acyclovir for presumed viral meningitis.  She was also found to have new onset A. fib with RVR  BP 117/80 (BP Location: Right Arm)   Pulse 86   Temp 97.8 F (36.6 C) (Oral)   Resp 17   Ht 5' 6.5" (1.689 m)   Wt 79.8 kg (175 lb 14.8 oz)   SpO2 98%   BMI 27.97 kg/m  On Exam: Gen. exam: Awake, alert, not in any distress Chest: Good air entry bilaterally, no rhonchi or rales CVS: Irregular, no murmurs Abdomen: Soft, nontender and nondistended Neurology: Non-focal Skin: No rash or lesions  Plan  Acute encephalopathy -Probably in the setting of fever/nonspecific viral infection, her encephalopathy has resolved she is alert and oriented x4 however still feels a little bit "off" -Antibiotics were discontinued on 5/28, she is on acyclovir alone -HSV DNA was negative -New onset nystagmus overnight and early this morning, discussed with Dr. Leonel Ramsay will repeat MRI with IAC, and if negative will benefit from a dose of steroids and transition her Acyclovir to p.o.  Atrial fibrillation, new onset -patient's CHA2DS2-VASc Score for Stroke Risk is > 2 -Cardiology consulted, appreciate input -Hold anticoagulation up until platelets are better. -2D echo done on 5/28 showed normal systolic ejection fraction 55 to 60%, no WMA  Hyperlipidemia -Continue home medications  Thrombocytopenia -discussed  with Dr. Alvy Bimler, unlikely TTP, smear without schistocytes and appears normal  -Potential offending agent/vancomycin was discontinued on 5/28    Rest as below  Arie Powell M. Cruzita Lederer, MD [facility] [phone]  If 7PM-7AM, please contact night-coverage [email or web address] Password [email or web address]    PROGRESS NOTE  HEAVENLEE MAIORANA WGN:[id number] DOB: 08/17/1945 DOA: 01/10/2018 PCP: Noralee Space, MD   LOS: 2 days   Brief Narrative / Interim history: Jazzmen is a 72 y.o. female with history of depression, anxiety, and HSV-1 cold sores admitted to the hospital on 5/27 for altered mental status and fever. Viral etiology suspected as spinal fluid negative for bacterial infection. Nursing staff reports new onset nystagmus last night and stat head CT showed no changes.   Assessment & Plan: Principal Problem:   Encephalopathy acute Active Problems:   Herpes simplex virus (HSV) infection   Dyslipidemia   UTI (urinary tract infection)   Headache  Acute Encephalopathy -Neurology consulted, ordered new MRI for evaluation of new symptoms -Continue Acyclovir as viral origin is suspected -LP results reviewed, no evidence for bacterial etiology and HSV is negative  Atrial fibrillation, new onset -CHADS-VASc Score is 2 -Echo results as described below -Cardiology consulted  Hyperlipidemia -Continue Ezetimibe 10mg  qd  Thrombocytopenia -Hematology consulted, unlikely TTP as smear was without schistocytes  Nystagmus -Neurology consulted, suspects labyrinthitis post viral infection. MRI results pending.    DVT prophylaxis: SCDs Code Status: Full Code Family Communication: N/A Disposition Plan: Home when improved  Consultants:   Neurology  Hematology  Cardiology  Procedures:  2D echo: - Left ventricle normal systolic function with EF  55%   to 60%. Left atrium severely dilated. Mild mitral valve regurgitation.   Antimicrobials:  N/A  Subjective: Pt reports last night when  trying to focus on objects with her eyes, the objects were moving. She reported associated nausea and vomiting. She also reported to nursing staff seeing a fog and "the room turned upside down." She reports feeling the same as yesterday in regards to weakness and fatigue. Her main concern continues to be figuring out what put her in the hospital.   Objective: Vitals:   01/11/18 2342 01/12/18 0034 01/12/18 0453 01/12/18 0839  BP: (!) 86/59 100/70 112/77 123/82  Pulse: 73  90 88  Resp: 18  18 16   Temp: 98.4 F (36.9 C)  97.7 F (36.5 C) (!) 97.5 F (36.4 C)  TempSrc: Oral  Oral Oral  SpO2: 95%  96% 96%  Weight:      Height:        Intake/Output Summary (Last 24 hours) at 01/12/2018 0958 Last data filed at 01/12/2018 0119 Gross per 24 hour  Intake 452 ml  Output 200 ml  Net 252 ml   Filed Weights   01/10/18 1457 01/10/18 2029  Weight: 80.3 kg (177 lb) 79.8 kg (175 lb 14.8 oz)    Examination:  Constitutional: NAD Eyes: right beating horizontal nystagmus in all directions of gaze, lids and conjunctivae normal thyromegaly Respiratory: clear to auscultation bilaterally, no wheezing, no crackles. Normal respiratory effort. No accessory muscle use.  Cardiovascular: Irregular rate and irregular rhythm, no murmurs / rubs / gallops. Irregular radial pulses. No LE edema. 2+ pedal pulses.  Abdomen: no tenderness. Bowel sounds positive.  Musculoskeletal: no clubbing / cyanosis. No joint deformity upper and lower extremities.  No contractures. Normal muscle tone.  Skin: no rashes, lesions, ulcers. No induration Neurologic: CN 2-12 grossly intact. Strength 5/5 in all 4.  Psychiatric: Normal judgment and insight. Alert and oriented x 3. Normal mood.    Data Reviewed: I have independently reviewed following labs and imaging studies   CBC: Recent Labs  Lab 01/06/18 1123 01/10/18 1514 01/11/18 0610 01/12/18 0636  WBC 5.8 2.3* 4.1 5.8  NEUTROABS 4.4 1.5*  --  3.9  HGB 14.8 14.0 14.6  13.2  HCT 43.3 40.7 43.2 38.6  MCV 83.2 80.0 80.6 80.6  PLT 189.0 69* 75* 62*   Basic Metabolic Panel: Recent Labs  Lab 01/06/18 1123 01/10/18 1514 01/11/18 0610  NA 135 130* 141  K 4.1 3.3* 3.5  CL 100 98* 110  CO2 28 21* 21*  GLUCOSE 111* 95 143*  BUN 12 11 9   CREATININE 0.85 0.82 0.64  CALCIUM 9.6 8.1* 7.9*   GFR: Estimated Creatinine Clearance: 67.4 mL/min (by C-G formula based on SCr of 0.64 mg/dL). Liver Function Tests: Recent Labs  Lab 01/06/18 1123 01/10/18 1514 01/11/18 0610  AST 23 81* 73*  ALT 19 50 48  ALKPHOS 78 59 56  BILITOT 0.9 1.7* 1.1  PROT 7.0 5.7* 5.5*  ALBUMIN 4.1 3.1* 2.8*   No results for input(s): LIPASE, AMYLASE in the last 168 hours. No results for input(s): AMMONIA in the last 168 hours. Coagulation Profile: Recent Labs  Lab 01/10/18 1514  INR 1.06   Cardiac Enzymes: No results for input(s): CKTOTAL, CKMB, CKMBINDEX, TROPONINI in the last 168 hours. BNP (last 3 results) No results for input(s): PROBNP in the last 8760 hours. HbA1C: No results for input(s): HGBA1C in the last 72 hours. CBG: Recent Labs  Lab 01/10/18 1511  GLUCAP 81  Lipid Profile: No results for input(s): CHOL, HDL, LDLCALC, TRIG, CHOLHDL, LDLDIRECT in the last 72 hours. Thyroid Function Tests: No results for input(s): TSH, T4TOTAL, FREET4, T3FREE, THYROIDAB in the last 72 hours. Anemia Panel: No results for input(s): VITAMINB12, FOLATE, FERRITIN, TIBC, IRON, RETICCTPCT in the last 72 hours. Urine analysis:    Component Value Date/Time   COLORURINE YELLOW 01/10/2018 1721   APPEARANCEUR HAZY (A) 01/10/2018 1721   LABSPEC 1.008 01/10/2018 1721   PHURINE 5.0 01/10/2018 1721   GLUCOSEU NEGATIVE 01/10/2018 1721   GLUCOSEU NEGATIVE 12/23/2017 1242   HGBUR MODERATE (A) 01/10/2018 1721   BILIRUBINUR NEGATIVE 01/10/2018 1721   KETONESUR 20 (A) 01/10/2018 1721   PROTEINUR NEGATIVE 01/10/2018 1721   UROBILINOGEN 0.2 12/23/2017 1242   NITRITE NEGATIVE  01/10/2018 1721   LEUKOCYTESUR NEGATIVE 01/10/2018 1721   Sepsis Labs: Invalid input(s): PROCALCITONIN, LACTICIDVEN  Recent Results (from the past 240 hour(s))  Urine culture     Status: None   Collection Time: 01/10/18  3:04 PM  Result Value Ref Range Status   Specimen Description URINE, RANDOM  Final   Special Requests NONE  Final   Culture   Final    NO GROWTH Performed at [facility], [address], [place], [place] [address]    Report Status 01/11/2018 FINAL  Final  Blood Culture (routine x 2)     Status: None (Preliminary result)   Collection Time: 01/10/18  3:10 PM  Result Value Ref Range Status   Specimen Description BLOOD LEFT HAND  Final   Special Requests   Final    BOTTLES DRAWN AEROBIC AND ANAEROBIC Blood Culture adequate volume   Culture   Final    NO GROWTH < 24 HOURS Performed at [facility], [place] [address] [place], [place] [address]    Report Status PENDING  Incomplete  Blood Culture (routine x 2)     Status: None (Preliminary result)   Collection Time: 01/10/18  3:16 PM  Result Value Ref Range Status   Specimen Description BLOOD RIGHT ANTECUBITAL  Final   Special Requests   Final    BOTTLES DRAWN AEROBIC AND ANAEROBIC Blood Culture adequate volume   Culture   Final    NO GROWTH < 24 HOURS Performed at [facility], [place] [address] [place], [place] [address]    Report Status PENDING  Incomplete  CSF culture     Status: None (Preliminary result)   Collection Time: 01/10/18  5:41 PM  Result Value Ref Range Status   Specimen Description CSF  Final   Special Requests NONE  Final   Gram Stain   Final    CYTOSPIN SMEAR WBC PRESENT, PREDOMINANTLY MONONUCLEAR NO ORGANISMS SEEN    Culture   Final    NO GROWTH 2 DAYS Performed at [facility], [place] [address], [place],  [address]    Report Status PENDING  Incomplete      Radiology Studies: Dg Chest 2 View  Result Date: 01/10/2018 CLINICAL DATA:  Fever, altered mental  status. EXAM: CHEST - 2 VIEW COMPARISON:  None. FINDINGS: Enlarged cardiac silhouette. Calcific atherosclerotic disease of the aorta and tortuosity. Mediastinal contours appear intact. There is no evidence of focal airspace consolidation, pleural effusion or pneumothorax. Low lung volumes with exaggeration of the interstitial markings. Thoracic kyphosis with stable changes of spondylosis of the thoracic spine. Soft tissues are grossly normal. IMPRESSION: No active cardiopulmonary disease. Enlarged heart. Electronically Signed   By: Thomas Hoff  Dimitrova M.D.   On: 01/10/2018 16:04   Ct Head Wo Contrast  Result Date: 01/12/2018 CLINICAL DATA:  Acute onset of nausea and vomiting. Nystagmus. EXAM: CT HEAD WITHOUT CONTRAST TECHNIQUE: Contiguous axial images were obtained from the base of the skull through the vertex without intravenous contrast. COMPARISON:  CT of the head performed 01/10/2018, and MRI of the brain performed 01/11/2018 FINDINGS: Brain: No evidence of acute infarction, hemorrhage, hydrocephalus, extra-axial collection or mass lesion / mass effect. Prominence of the ventricles and sulci reflects mild cortical volume loss. Mild periventricular white matter change likely reflects small vessel ischemic microangiopathy. The brainstem and fourth ventricle are within normal limits. The basal ganglia are unremarkable in appearance. The cerebral hemispheres demonstrate grossly normal gray-white differentiation. No mass effect or midline shift is seen. Vascular: No hyperdense vessel or unexpected calcification. Skull: There is no evidence of fracture; visualized osseous structures are unremarkable in appearance. Sinuses/Orbits: The visualized portions of the orbits are within normal limits. The paranasal sinuses and mastoid air cells are well-aerated. Other: No significant soft tissue abnormalities are seen. IMPRESSION: 1. No acute intracranial pathology seen on CT. 2. Mild cortical volume loss and scattered  small vessel ischemic microangiopathy. Electronically Signed   By: Garald Balding M.D.   On: 01/12/2018 06:28   Ct Head Wo Contrast  Result Date: 01/10/2018 CLINICAL DATA:  Altered level of consciousness. EXAM: CT HEAD WITHOUT CONTRAST TECHNIQUE: Contiguous axial images were obtained from the base of the skull through the vertex without intravenous contrast. COMPARISON:  CT scan of January 16, 2015. FINDINGS: Brain: Mild diffuse cortical atrophy is noted. Mild chronic ischemic white matter disease is noted. No mass effect or midline shift is noted. Ventricular size is within normal limits. There is no evidence of mass lesion, hemorrhage or acute infarction. Vascular: No hyperdense vessel or unexpected calcification. Skull: Normal. Negative for fracture or focal lesion. Sinuses/Orbits: No acute finding. Other: None. IMPRESSION: Mild diffuse cortical atrophy. Mild chronic ischemic white matter disease. No acute intracranial abnormality seen. Electronically Signed   By: Marijo Conception, M.D.   On: 01/10/2018 16:20   Mr Brain W And Wo Contrast  Result Date: 01/11/2018 CLINICAL DATA:  Altered mental status EXAM: MRI HEAD WITHOUT AND WITH CONTRAST TECHNIQUE: Multiplanar, multiecho pulse sequences of the brain and surrounding structures were obtained without and with intravenous contrast. CONTRAST:  76mL MULTIHANCE GADOBENATE DIMEGLUMINE 529 MG/ML IV SOLN COMPARISON:  Head CT 01/10/2018 FINDINGS: BRAIN: The midline structures are normal. There is no acute infarct or acute hemorrhage. There is no mass lesion or other mass effect. There is no hydrocephalus, dural abnormality or extra-axial collection. Multifocal white matter hyperintensity, most commonly due to chronic ischemic microangiopathy. No age-advanced or lobar predominant atrophy. No chronic microhemorrhage or superficial siderosis. VASCULAR: Major intracranial arterial and venous sinus flow voids are preserved. SKULL AND UPPER CERVICAL SPINE: The visualized  skull base, calvarium, upper cervical spine and extracranial soft tissues are normal. SINUSES/ORBITS: No fluid levels or advanced mucosal thickening. No mastoid or middle ear effusion. The orbits are normal. 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 Electronically Signed   By: Ulyses Jarred M.D.   On: 01/11/2018 04:43     Scheduled Meds: . aspirin  325 mg Oral Daily  . B-complex with vitamin C  1 tablet Oral Daily  . baclofen  10 mg Oral QHS  . ezetimibe  10 mg Oral Daily  . FLUoxetine  40 mg Oral Daily  . loratadine  10 mg  Oral Daily  . metoprolol tartrate  25 mg Oral BID   Continuous Infusions: . sodium chloride 100 mL/hr at 01/12/18 0838  . acyclovir 800 mg (01/12/18 0904)    Carita Pian PA-S    Marzetta Board, MD, PhD [facility] Pager [phone]  If 7PM-7AM, please contact night-coverage [email or web address] Password [email or web address] 01/12/2018, 9:58 AM

## 2018-01-12 NOTE — Progress Notes (Addendum)
Progress Note  Patient Name: Carly Rhodes Date of Encounter: 01/12/2018  Primary Cardiologist: No primary care provider on file.   Subjective   Reports having vertigo this morning. Was sent for CT scan to r/o stroke and felt nauseated. She vomited while in CT. No complaints of chest pain this morning.  Inpatient Medications    Scheduled Meds: . aspirin  325 mg Oral Daily  . B-complex with vitamin C  1 tablet Oral Daily  . baclofen  10 mg Oral QHS  . ezetimibe  10 mg Oral Daily  . FLUoxetine  40 mg Oral Daily  . loratadine  10 mg Oral Daily  . metoprolol tartrate  25 mg Oral BID   Continuous Infusions: . sodium chloride 100 mL/hr at 01/12/18 0838  . acyclovir Stopped (01/12/18 1035)   PRN Meds: acetaminophen, ondansetron **OR** ondansetron (ZOFRAN) IV, traMADol   Vital Signs    Vitals:   01/12/18 0034 01/12/18 0453 01/12/18 0839 01/12/18 1044  BP: 100/70 112/77 123/82 113/79  Pulse:  90 88 85  Resp:  18 16 18   Temp:  97.7 F (36.5 C) (!) 97.5 F (36.4 C) 97.7 F (36.5 C)  TempSrc:  Oral Oral Oral  SpO2:  96% 96% 98%  Weight:      Height:        Intake/Output Summary (Last 24 hours) at 01/12/2018 1122 Last data filed at 01/12/2018 0119 Gross per 24 hour  Intake 452 ml  Output 200 ml  Net 252 ml   Filed Weights   01/10/18 1457 01/10/18 2029  Weight: 177 lb (80.3 kg) 175 lb 14.8 oz (79.8 kg)    Telemetry    Atrial fibrillation with episode of RVR yesterday evening, with intermittent bradycardia overnight. 4.5 second pause noted this morning while in CT - Personally Reviewed  Physical Exam   GEN: Laying in bed in no acute distress.   Neck: No JVD, no carotid bruits Cardiac: IRRR, no murmurs, rubs, or gallops.  Respiratory: faint crackles at lung bases GI: NABS, Soft, nontender, non-distended  MS: No edema; No deformity. Neuro:  Nonfocal, moving all extremities spontaneously Psych: anxious, still confused  Labs    Chemistry Recent Labs  Lab  01/06/18 1123 01/10/18 1514 01/11/18 0610  NA 135 130* 141  K 4.1 3.3* 3.5  CL 100 98* 110  CO2 28 21* 21*  GLUCOSE 111* 95 143*  BUN 12 11 9   CREATININE 0.85 0.82 0.64  CALCIUM 9.6 8.1* 7.9*  PROT 7.0 5.7* 5.5*  ALBUMIN 4.1 3.1* 2.8*  AST 23 81* 73*  ALT 19 50 48  ALKPHOS 78 59 56  BILITOT 0.9 1.7* 1.1  GFRNONAA  --  >60 >60  GFRAA  --  >60 >60  ANIONGAP  --  11 10     Hematology Recent Labs  Lab 01/10/18 1514 01/11/18 0610 01/12/18 0636  WBC 2.3* 4.1 5.8  RBC 5.09 5.36* 4.79  HGB 14.0 14.6 13.2  HCT 40.7 43.2 38.6  MCV 80.0 80.6 80.6  MCH 27.5 27.2 27.6  MCHC 34.4 33.8 34.2  RDW 13.1 13.5 14.0  PLT 69* 75* 62*    Cardiac EnzymesNo results for input(s): TROPONINI in the last 168 hours. No results for input(s): TROPIPOC in the last 168 hours.   BNPNo results for input(s): BNP, PROBNP in the last 168 hours.   DDimer No results for input(s): DDIMER in the last 168 hours.   Radiology    Dg Chest 2 View  Result  Date: 01/10/2018 CLINICAL DATA:  Fever, altered mental status. EXAM: CHEST - 2 VIEW COMPARISON:  None. FINDINGS: Enlarged cardiac silhouette. Calcific atherosclerotic disease of the aorta and tortuosity. Mediastinal contours appear intact. There is no evidence of focal airspace consolidation, pleural effusion or pneumothorax. Low lung volumes with exaggeration of the interstitial markings. Thoracic kyphosis with stable changes of spondylosis of the thoracic spine. Soft tissues are grossly normal. IMPRESSION: No active cardiopulmonary disease. Enlarged heart. Electronically Signed   By: Fidela Salisbury M.D.   On: 01/10/2018 16:04   Ct Head Wo Contrast  Result Date: 01/12/2018 CLINICAL DATA:  Acute onset of nausea and vomiting. Nystagmus. EXAM: CT HEAD WITHOUT CONTRAST TECHNIQUE: Contiguous axial images were obtained from the base of the skull through the vertex without intravenous contrast. COMPARISON:  CT of the head performed 01/10/2018, and MRI of the  brain performed 01/11/2018 FINDINGS: Brain: No evidence of acute infarction, hemorrhage, hydrocephalus, extra-axial collection or mass lesion / mass effect. Prominence of the ventricles and sulci reflects mild cortical volume loss. Mild periventricular white matter change likely reflects small vessel ischemic microangiopathy. The brainstem and fourth ventricle are within normal limits. The basal ganglia are unremarkable in appearance. The cerebral hemispheres demonstrate grossly normal gray-white differentiation. No mass effect or midline shift is seen. Vascular: No hyperdense vessel or unexpected calcification. Skull: There is no evidence of fracture; visualized osseous structures are unremarkable in appearance. Sinuses/Orbits: The visualized portions of the orbits are within normal limits. The paranasal sinuses and mastoid air cells are well-aerated. Other: No significant soft tissue abnormalities are seen. IMPRESSION: 1. No acute intracranial pathology seen on CT. 2. Mild cortical volume loss and scattered small vessel ischemic microangiopathy. Electronically Signed   By: Garald Balding M.D.   On: 01/12/2018 06:28   Ct Head Wo Contrast  Result Date: 01/10/2018 CLINICAL DATA:  Altered level of consciousness. EXAM: CT HEAD WITHOUT CONTRAST TECHNIQUE: Contiguous axial images were obtained from the base of the skull through the vertex without intravenous contrast. COMPARISON:  CT scan of January 16, 2015. FINDINGS: Brain: Mild diffuse cortical atrophy is noted. Mild chronic ischemic white matter disease is noted. No mass effect or midline shift is noted. Ventricular size is within normal limits. There is no evidence of mass lesion, hemorrhage or acute infarction. Vascular: No hyperdense vessel or unexpected calcification. Skull: Normal. Negative for fracture or focal lesion. Sinuses/Orbits: No acute finding. Other: None. IMPRESSION: Mild diffuse cortical atrophy. Mild chronic ischemic white matter disease. No acute  intracranial abnormality seen. Electronically Signed   By: Marijo Conception, M.D.   On: 01/10/2018 16:20   Mr Brain W And Wo Contrast  Result Date: 01/11/2018 CLINICAL DATA:  Altered mental status EXAM: MRI HEAD WITHOUT AND WITH CONTRAST TECHNIQUE: Multiplanar, multiecho pulse sequences of the brain and surrounding structures were obtained without and with intravenous contrast. CONTRAST:  43mL MULTIHANCE GADOBENATE DIMEGLUMINE 529 MG/ML IV SOLN COMPARISON:  Head CT 01/10/2018 FINDINGS: BRAIN: The midline structures are normal. There is no acute infarct or acute hemorrhage. There is no mass lesion or other mass effect. There is no hydrocephalus, dural abnormality or extra-axial collection. Multifocal white matter hyperintensity, most commonly due to chronic ischemic microangiopathy. No age-advanced or lobar predominant atrophy. No chronic microhemorrhage or superficial siderosis. VASCULAR: Major intracranial arterial and venous sinus flow voids are preserved. SKULL AND UPPER CERVICAL SPINE: The visualized skull base, calvarium, upper cervical spine and extracranial soft tissues are normal. SINUSES/ORBITS: No fluid levels or advanced mucosal thickening. No mastoid  or middle ear effusion. The orbits are normal. 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 Electronically Signed   By: Ulyses Jarred M.D.   On: 01/11/2018 04:43    Cardiac Studies   Echocardiogram 01/11/18: Study Conclusions  - Left ventricle: The cavity size was normal. Systolic function was   normal. The estimated ejection fraction was in the range of 55%   to 60%. Wall motion was normal; there were no regional wall   motion abnormalities. - Aortic valve: Transvalvular velocity was within the normal range.   There was no stenosis. There was no regurgitation. Valve area   (VTI): 1.97 cm^2. Valve area (Vmax): 2.23 cm^2. Valve area   (Vmean): 2.19 cm^2. - Mitral valve: Transvalvular velocity was  within the normal range.   There was no evidence for stenosis. There was mild regurgitation.   Valve area by pressure half-time: 1.26 cm^2. - Left atrium: The atrium was severely dilated. - Right ventricle: The cavity size was normal. Wall thickness was   normal. Systolic function was normal. - Tricuspid valve: There was trivial regurgitation. - Pulmonary arteries: Systolic pressure was within the normal   range. PA peak pressure: 21 mm Hg (S).   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 Cardiology consulted for atrial fibrillation with RVR  Assessment & Plan    1. Atrial fibrillation with RVR: rate overall improved from yesterday after starting metoprolol po. Anticoagulation deferred given thrombocytopenia. Hopeful this will be a transient episode driven by infection.  - Will continue metoprolol at 12.5mg  BID today - CHADS2VASc score of 2 (age and female) - would ultimately benefit from anticoagulation for stroke prevention, although limited by thrombocytopenia. Should thrombocytopenia resolve, could consider eliquis BID.  - ASA discontinued given thrombocytopenia  2. Pause on telemetry: patient with 4.5 second pause while at CT scan. She reported an episode of vomiting at the time of documented pause. She did not lose consciousness. Suspect the pause was 2/2 a vasovagal event.  - Metoprolol decreased to 12.5mg  BID today - No indication for ICD at this time - Continue to monitor on telemetry.   3. AMS/ encephalopathy: Neurology following. LP negative for acute findings. CT Head/MRI with chronic ischemic microvascular changes; no acute findings. Repeat MRI pending - if negative, planning for steroids. On acyclovir for possible HSV component.  - Continue management per primary team and Neurology   4. Thrombocytopenia: PLT 75>62 on labs today. Hematology following and thought likely 2/2  medications - vancomycin vs heparin (both discontinued yesterday) - ASA discontinued today - Will defer anticoagulation for stroke prevention at this time - Continue management per primary team and hematology  5. HLD: - Continue statin  For questions or updates, please contact [place] Please consult [email or web address] for contact info under Cardiology/STEMI.      Signed, Abigail Butts, PA-C  01/12/2018, 11:22 AM   [phone]  Personally seen and examined. Agree with above. Feels better today. No CP.  Having some nausea however.  Felt sick and nauseous during CT scan.  Telemetry personally reviewed revealed 4-second pause during heaving.  GEN: Well nourished, well developed, in no acute distress  HEENT: normal  Neck: no JVD, carotid bruits, or masses Cardiac: Irregular; no murmurs, rubs, or gallops,no edema  Respiratory:   minimal crackles bilaterally, normal work of breathing GI: soft, nontender, nondistended, + BS MS: no deformity or atrophy  Skin: warm and  dry, no rash Neuro:  Alert and Oriented x 3, Strength and sensation are intact Psych: euthymic mood, full affect  Atrial fibrillation - We will decrease her metoprolol to 12.5 mg's twice a day given pause.  However, this pause was likely vaguely mediated in the setting of Valsalva maneuver. -No anticoagulation because of continued thrombocytopenia. -Likely driven by underlying illness which is being treated with antibiotics.  E. coli in urine.  Candee Furbish, MD

## 2018-01-12 NOTE — Progress Notes (Signed)
MD notified of 5 second heart beat pause, no new orders as of now, patient is ok and A&O

## 2018-01-12 NOTE — Progress Notes (Signed)
Per radiology protocol will have to wait 48 hours in order to administer contrast again.

## 2018-01-12 NOTE — Progress Notes (Signed)
Pt has returned.  

## 2018-01-12 NOTE — Progress Notes (Signed)
Aroor clarified with Leonel Ramsay and Radiology the MRI test will be done tonight.

## 2018-01-12 NOTE — Progress Notes (Signed)
Pt had a 4.3 sec pause while in CT at 0607. Paged Tylene Fantasia, MD.

## 2018-01-12 NOTE — Progress Notes (Signed)
Pt went down for CT.

## 2018-01-12 NOTE — Progress Notes (Addendum)
Subjective: Developed nystagmus/nausea overnight  Exam: Vitals:   01/12/18 0034 01/12/18 0453  BP: 100/70 112/77  Pulse:  90  Resp:  18  Temp:  97.7 F (36.5 C)  SpO2:  96%   Gen: In bed, NAD Resp: non-labored breathing, no acute distress Abd: soft, nt HENT: healing lesion consistent with HSV on her lip. No clear lesions in her ear canal. Hearing intact to finger rub.   Neuro: MS: Awake, alert, interactive and appropriate OQ:HUTML, EOMI but with persistent right beating nystagmus with rotary component in all directions of gaze.  Motor: 5/5 throughout Sensory:intact to LT Cerebellar: She has some difficulty with FNF with eyes open, but improves with eyes closed. +head thrust to the left  Pertinent Labs: Platelets 62  Impression: 72 yo F with AMS in the setting of high fever. With negative spinal fluid and with her improvement, I think HSV encephalitis is relatively unlikely. Her current symptoms are consistent with labrynthitis. Although valtrex has not been shown to improve outcome in all-comers, with her recent lip outbreak, I do think it may be reasonable to continue  Acyclovir until HSV is back, then use valtrex 1g TID.   With her fevers, etc, I think that IAC imaging would be warrented.   Recommendations: 1) MRI brain/IAC 2) If no clear cause on MRI, then would use prednisone 60mg  x5 days followed by 5 day taper.  3) Continue acyclovir for now.  4) Likely to need vestibular PT  Roland Rack, MD [facility] [phone]  If 7pm- 7am, please page neurology on call as listed in [place].

## 2018-01-13 ENCOUNTER — Inpatient Hospital Stay (HOSPITAL_COMMUNITY): Payer: PPO

## 2018-01-13 LAB — CBC WITH DIFFERENTIAL/PLATELET
BASOS PCT: 0 %
Basophils Absolute: 0 10*3/uL (ref 0.0–0.1)
EOS ABS: 0 10*3/uL (ref 0.0–0.7)
Eosinophils Relative: 0 %
HCT: 39.7 % (ref 36.0–46.0)
Hemoglobin: 13.7 g/dL (ref 12.0–15.0)
Lymphocytes Relative: 34 %
Lymphs Abs: 3.9 10*3/uL (ref 0.7–4.0)
MCH: 27.1 pg (ref 26.0–34.0)
MCHC: 34.5 g/dL (ref 30.0–36.0)
MCV: 78.6 fL (ref 78.0–100.0)
MONO ABS: 0.5 10*3/uL (ref 0.1–1.0)
Monocytes Relative: 4 %
NEUTROS PCT: 62 %
Neutro Abs: 7 10*3/uL (ref 1.7–7.7)
Platelets: 114 10*3/uL — ABNORMAL LOW (ref 150–400)
RBC: 5.05 MIL/uL (ref 3.87–5.11)
RDW: 14 % (ref 11.5–15.5)
WBC: 11.4 10*3/uL — ABNORMAL HIGH (ref 4.0–10.5)

## 2018-01-13 MED ORDER — GADOBENATE DIMEGLUMINE 529 MG/ML IV SOLN
15.0000 mL | Freq: Once | INTRAVENOUS | Status: AC | PRN
  Administered 2018-01-13: 15 mL via INTRAVENOUS

## 2018-01-13 MED ORDER — VALACYCLOVIR HCL 500 MG PO TABS
1000.0000 mg | ORAL_TABLET | Freq: Three times a day (TID) | ORAL | Status: DC
  Filled 2018-01-13: qty 2

## 2018-01-13 MED ORDER — WHITE PETROLATUM EX OINT
TOPICAL_OINTMENT | CUTANEOUS | Status: AC
  Administered 2018-01-13: 0.2
  Filled 2018-01-13: qty 28.35

## 2018-01-13 MED ORDER — PREDNISONE 20 MG PO TABS
60.0000 mg | ORAL_TABLET | Freq: Every day | ORAL | Status: DC
  Administered 2018-01-13 – 2018-01-15 (×3): 60 mg via ORAL
  Filled 2018-01-13 (×3): qty 3

## 2018-01-13 NOTE — Progress Notes (Signed)
Subjective: Somewhat improved overnight. MRI negative.   Exam: Vitals:   01/13/18 0003 01/13/18 0534  BP: 93/70 118/76  Pulse: 97 (!) 117  Resp: 18 18  Temp: 98.4 F (36.9 C) 98 F (36.7 C)  SpO2: 94% 97%   Gen: In bed, NAD Resp: non-labored breathing, no acute distress Abd: soft, nt HENT: healing lesion consistent with HSV on her lip.  Neuro: MS: Awake, alert, interactive and appropriate EM:LJQGB, EOMI but with persistent right beating nystagmus with rotary component in all directions of gaze.  Motor: 5/5 throughout Sensory:intact to LT Cerebellar: Normal FNF  Pertinent Labs: Platelets 75  Impression: 72 yo F with AMS in the setting of high fever. At this point, I think that the most likely explanation is viral syndrome with delirium with post-viral labyrinthitis. Certainly the delirium is improving compared to admission and her labrynthitis is better today than yesterday as well(consistent with expected course)  She describes illusions/hallucinations typical of delirium. Of note, she was on acyclovir prior to admission, and therefore has completed a 7 day course already. Given that this can be associated with both thrombocytopenia and delirium, I would favor stopping this at this time. Prednisone will likely speed recovery from her labyrinthitis, but would not affect overall outcome, so if it worsens delirium, then could discontinue this.    Recommendations: 1) Prednisone 60mg  x5 days followed by 5 day taper(50mg -40mg -30mg -20mg -10mg ).  2) would stop valtrex 3) Likely to need vestibular PT 4) Please call with any further questions or concerns.   Roland Rack, MD [facility] [phone]  If 7pm- 7am, please page neurology on call as listed in [place].

## 2018-01-13 NOTE — Evaluation (Signed)
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  Being treated for viral meningitis and a-fib w/RVR.  Clinical Impression  Patient presents with decreased independence and safety with mobility due to weakness, vertigo, decreased activity tolerance and balance.  She currently needs min A with mod cues for very slow ambulation in hallway with walker.  She was previously independent taking care of her spouse in a two level home.  She does not feel she can return home safely and may need SNF vs. CIR level rehab if not able to progress to be independent upon d/c.  Since her spouse cannot assist her she may need SNF if unable to progress to independent upon d/c.    Follow Up Recommendations SNF;Supervision/Assistance - 24 hour    Equipment Recommendations  Rolling walker with 5" wheels    Recommendations for Other Services       Precautions / Restrictions Precautions Precautions: Fall      Mobility  Bed Mobility Overal bed mobility: Needs Assistance Bed Mobility: Supine to Sit;Sit to Supine     Supine to sit: Supervision;HOB elevated Sit to supine: Supervision   General bed mobility comments: increased time, assist for safety  Transfers Overall transfer level: Needs assistance Equipment used: Rolling walker (2 wheeled) Transfers: Sit to/from Stand Sit to Stand: Min assist         General transfer comment: cues for targeting and assist for balance  Ambulation/Gait Ambulation/Gait assistance: Min assist Ambulation Distance (Feet): 140 Feet Assistive device: Rolling walker (2 wheeled) Gait Pattern/deviations: Step-through pattern;Decreased stride length;Shuffle     General Gait Details: slow pace,  cues for targeting throughout, assist for turns with walker and for balance  Stairs            Wheelchair Mobility    Modified Rankin (Stroke Patients Only)       Balance Overall balance assessment: Needs assistance   Sitting balance-Leahy Scale: Fair     Standing balance support: Bilateral upper extremity supported Standing balance-Leahy Scale: Poor Standing balance comment: UE support needed                             Pertinent Vitals/Pain Pain Assessment: No/denies pain    Home Living Family/patient expects to be discharged to:: Private residence Living Arrangements: Spouse/significant other Available Help at Discharge: Family Type of Home: House Home Access: Stairs to enter Entrance Stairs-Rails: Can reach both Entrance Stairs-Number of Steps: 10 Home Layout: Two level Home Equipment: Cane - single point Additional Comments: not sure if has a walker at home    Prior Function Level of Independence: Independent         Comments: spouse can't help     Hand Dominance   Dominant Hand: Right    Extremity/Trunk Assessment   Upper Extremity Assessment Upper Extremity Assessment: Overall WFL for tasks assessed    Lower Extremity Assessment Lower Extremity Assessment: Overall WFL for tasks assessed       Communication   Communication: No difficulties  Cognition Arousal/Alertness: Awake/alert Behavior During Therapy: WFL for tasks assessed/performed Overall Cognitive Status: Impaired/Different from baseline Area of Impairment: Attention;Memory;Problem solving  Current Attention Level: Sustained Memory: Decreased short-term memory       Problem Solving: Slow processing        General Comments    Vestibular Assessment - 01/13/18 0001      Vestibular Assessment   General Observation  patient reports was vertiginous yesterday and had been nauseated, but today has been better since starting Prednisone.        Symptom Behavior   Type of Dizziness  Unsteady with head/body turns    Frequency of Dizziness  intermittent    Duration of Dizziness  minutes    Aggravating Factors  Turning head quickly    Relieving Factors  Rest;Slow movements      Occulomotor Exam   Occulomotor Alignment  Normal    Spontaneous  Absent    Gaze-induced  Right beating nystagmus with R gaze    Smooth Pursuits  Intact    Saccades  Intact;Slow      Vestibulo-Occular Reflex   VOR 1 Head Only (x 1 viewing)  performed with assist for head movements x 30 sec horizontal head turns    VOR to Slow Head Movement  Positive left    VOR Cancellation  Corrective saccades      Auditory   Comments  intact and equal bilateral to scratch test         Exercises     Assessment/Plan    PT Assessment Patient needs continued PT services  PT Problem List Decreased strength;Decreased mobility;Decreased activity tolerance;Decreased safety awareness;Decreased balance;Decreased knowledge of use of DME       PT Treatment Interventions DME instruction;Functional mobility training;Balance training;Patient/family education;Therapeutic exercise;Gait training;Therapeutic activities;Neuromuscular re-education;Other (comment)(vestibular rehab)    PT Goals (Current goals can be found in the Care Plan section)  Acute Rehab PT Goals Patient Stated Goal: to get better before going home PT Goal Formulation: With patient Time For Goal Achievement: 01/27/18 Potential to Achieve Goals: Good    Frequency Min 3X/week   Barriers to discharge        Co-evaluation               AM-PAC PT "6 Clicks" Daily Activity  Outcome Measure Difficulty turning over in bed (including adjusting bedclothes, sheets and blankets)?: A Little Difficulty moving from lying on back to sitting on the side of the bed? : A Lot Difficulty sitting down on and standing up from a chair with arms (e.g., wheelchair, bedside commode, etc,.)?: Unable Help needed  moving to and from a bed to chair (including a wheelchair)?: A Little Help needed walking in hospital room?: A Little Help needed climbing 3-5 steps with a railing? : A Lot 6 Click Score: 14    End of Session Equipment Utilized During Treatment: Gait belt Activity Tolerance: Patient limited by fatigue Patient left: in bed;with call bell/phone within reach;with family/visitor present;with bed alarm set   PT Visit Diagnosis: Other abnormalities of gait and mobility (R26.89);Dizziness and giddiness (R42)    Time: 1405-1450 PT Time Calculation (min) (ACUTE ONLY): 45 min   Charges:   PT Evaluation $PT Eval Moderate Complexity: 1 Mod PT Treatments $Gait Training: 8-22 mins $Neuromuscular Re-education: 8-22 mins   PT G CodesMagda Kiel, Jalynn [phone] 01/13/2018   Reginia Naas 01/13/2018, 4:20 PM

## 2018-01-13 NOTE — Progress Notes (Signed)
PROGRESS NOTE  Attending MD note  Patient was seen, examined,treatment plan was discussed with the PA-S.  I have personally reviewed the clinical findings, lab, imaging studies and management of this patient in detail. 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  She was initially placed on broad-spectrum antibiotics to cover for meningitis however these were discontinued once the LP was negative.  She has been maintained on acyclovir for presumed viral meningitis.  She was also found to have new onset A. fib with RVR for which cardiology was consulted  BP 133/83 (BP Location: Left Arm)   Pulse 97   Temp 97.6 F (36.4 C) (Oral)   Resp 16   Ht 5' 6.5" (1.689 m)   Wt 79.8 kg (175 lb 14.8 oz)   SpO2 95%   BMI 27.97 kg/m  On Exam: Gen. exam: Awake, alert, not in any distress Chest: Good air entry bilaterally, no rhonchi or rales CVS: irregular Abdomen: Soft, nontender and nondistended Neurology: Non-focal Skin: No rash or lesions  Plan  Acute encephalopathy -Probably in the setting of fever/nonspecific viral infection, her encephalopathy has resolved she is alert and oriented x4 however still feels a little bit "off" -Antibiotics were discontinued on 5/28, she is on acyclovir alone -HSV DNA was negative -Patient developed post viral labyrinthitis, discussed with Dr. Leonel Ramsay, start steroids.  She has completed 7 days of acyclovir, stop as it can also cause encephalopathy/delirium  Atrial fibrillation, new onset, concern for tachybradycardia syndrome -patient's CHA2DS2-VASc Score for Stroke Risk is> 2 -Cardiology consulted, appreciate input -Patient initially started on metoprolol however had a one 4-second and one 5-second pause on telemetry yesterday, metoprolol has since been  discontinued -EP to see today -2D echo done on 5/28 showed normal systolic ejection fraction 55 to 60%, no WMA  Hyperlipidemia -Continue home medications  Thrombocytopenia -discussed with Dr.Gorsuch, unlikely TTP, smear without schistocytes and appears normal  -Potential offending agent/vancomycin was discontinued on 5/28 -Platelets are starting to recover, continue to monitor, okay for anticoagulation per Dr. poor such    Rest as below  Costin M. Cruzita Lederer, MD [facility] [phone]  If 7PM-7AM, please contact night-coverage [email or web address] [place] DUK:[phone] DOB: [birth year] DOA: 01/10/2018 PCP: Noralee Space, MD   LOS: 3 days   Brief Narrative / Interim history: Kendrea is a 72 y.o. female with history of depression, anxiety, and HSV-1 cold sores admitted to the hospital on 5/27 for altered mental status and fever. LP was negative for bacterial etiology and negative for HSV. Patient has been taken off antibiotics and antivirals. MRI/IAC performed for new onset of nystagmus and was negative.   Assessment & Plan: Principal Problem:   Encephalopathy acute Active Problems:   Herpes simplex virus (HSV) infection   Dyslipidemia   UTI (urinary tract infection)   Headache  Acute Encephalopathy -Pt is oriented x3 with episodes of delirium/hallucinations but is improving. Likely medication induced from Acyclovir which patient started prior to admission -Neurology consulted, d/c of Acyclovir -Persistent weakness, PT to evaluate and treat   Atrial fibrillation, new onset -CHADS-VASc Score is 2, patient would benefit from anticoagulation -Echo results as described below -Cardiology consulted, reports patient is reasonably rate controlled without medication currently. Will wait to anticoagulate after patient sees EP  Hyperlipidemia -Continue Ezetimibe 10mg  qd  Thrombocytopenia -Platelet count is improving since d/c of Vancomycin  and Heparin.  -Hematology consulted, patient OK to start anticoagulation for Afib  Labyrinthitis -MRI/IAC negative, symptoms consistent with labyrinthitis -Pt is improving, nausea and vomiting have resolved, but she continues to feel unsteady and like objects are moving -Neurology consulted, Prednisone taper to speed recovery. However, if delirium worsens will d/c prednisone   DVT prophylaxis: SCDs Code Status: Full Code Family Communication: N/A Disposition Plan: CIR when improved  Consultants:   Neurology  Hematology  Cardiology  Electrophysiology  Procedures:  2D echo: - Left ventricle normal systolic function with EF 55%   to 60%. Left atrium severely dilated. Mild mitral valve regurgitation.   Antimicrobials:  N/A  Subjective: Pt reports feeling less nauseated today as compared to yesterday. She continues to have significant weakness and deliriums. She reports "when I close my eyes I see the Earth falling in." She also reports smoke coming out of her sharps container. She is worried she will not be able to go up the stairs to get to her home on the second floor.   Objective: Vitals:   01/13/18 0003 01/13/18 0534 01/13/18 0840 01/13/18 1122  BP: 93/70 118/76 133/81 133/83  Pulse: 97 (!) 117 91 97  Resp: 18 18 20 16   Temp: 98.4 F (36.9 C) 98 F (36.7 C) 97.6 F (36.4 C) 97.6 F (36.4 C)  TempSrc: Oral Oral Oral Oral  SpO2: 94% 97% 97% 95%  Weight:      Height:        Intake/Output Summary (Last 24 hours) at 01/13/2018 1210 Last data filed at 01/13/2018 0936 Gross per 24 hour  Intake 858 ml  Output 1600 ml  Net -742 ml   Filed Weights   01/10/18 1457 01/10/18 2029  Weight: 80.3 kg (177 lb) 79.8 kg (175 lb 14.8 oz)    Examination:  Constitutional: NAD Eyes: right beating horizontal nystagmus in all directions of gaze, lids and conjunctivae normal Respiratory: clear to auscultation bilaterally, no wheezing, no crackles. Normal respiratory effort. No  accessory muscle use.  Cardiovascular: Irregular rate and irregular rhythm, no murmurs / rubs / gallops. Irregular radial pulses. No LE edema. 2+ pedal pulses.  Abdomen: no tenderness. Bowel sounds positive.  Musculoskeletal: no clubbing / cyanosis. No joint deformity upper and lower extremities.  No contractures. Normal muscle tone.  Skin: no rashes, lesions, ulcers. No induration Neurologic: CN 2-12 grossly intact. Strength 5/5 in all 4.  Psychiatric: Normal judgment and insight. Alert and oriented x 3. Normal mood.    Data Reviewed: I have independently reviewed following labs and imaging studies   CBC: Recent Labs  Lab 01/10/18 1514 01/11/18 0610 01/12/18 0636 01/13/18 0745  WBC 2.3* 4.1 5.8 11.4*  NEUTROABS 1.5*  --  3.9 PENDING  HGB 14.0 14.6 13.2 13.7  HCT 40.7 43.2 38.6 39.7  MCV 80.0 80.6 80.6 78.6  PLT 69* 75* 62* 323*   Basic Metabolic Panel: Recent Labs  Lab 01/10/18 1514 01/11/18 0610  NA 130* 141  K 3.3* 3.5  CL 98* 110  CO2 21* 21*  GLUCOSE 95 143*  BUN 11 9  CREATININE 0.82 0.64  CALCIUM 8.1* 7.9*   GFR: Estimated Creatinine Clearance: 67.4 mL/min (by C-G formula based on SCr of 0.64 mg/dL). Liver Function Tests: Recent Labs  Lab 01/10/18 1514 01/11/18 0610  AST 81* 73*  ALT 50 48  ALKPHOS 59 56  BILITOT 1.7* 1.1  PROT 5.7* 5.5*  ALBUMIN 3.1* 2.8*  No results for input(s): LIPASE, AMYLASE in the last 168 hours. No results for input(s): AMMONIA in the last 168 hours. Coagulation Profile: Recent Labs  Lab 01/10/18 1514  INR 1.06   Cardiac Enzymes: No results for input(s): CKTOTAL, CKMB, CKMBINDEX, TROPONINI in the last 168 hours. BNP (last 3 results) No results for input(s): PROBNP in the last 8760 hours. HbA1C: No results for input(s): HGBA1C in the last 72 hours. CBG: Recent Labs  Lab 01/10/18 1511  GLUCAP 81   Lipid Profile: No results for input(s): CHOL, HDL, LDLCALC, TRIG, CHOLHDL, LDLDIRECT in the last 72 hours. Thyroid  Function Tests: No results for input(s): TSH, T4TOTAL, FREET4, T3FREE, THYROIDAB in the last 72 hours. Anemia Panel: No results for input(s): VITAMINB12, FOLATE, FERRITIN, TIBC, IRON, RETICCTPCT in the last 72 hours. Urine analysis:    Component Value Date/Time   COLORURINE YELLOW 01/10/2018 1721   APPEARANCEUR HAZY (A) 01/10/2018 1721   LABSPEC 1.008 01/10/2018 1721   PHURINE 5.0 01/10/2018 1721   GLUCOSEU NEGATIVE 01/10/2018 1721   GLUCOSEU NEGATIVE 12/23/2017 1242   HGBUR MODERATE (A) 01/10/2018 1721   BILIRUBINUR NEGATIVE 01/10/2018 1721   KETONESUR 20 (A) 01/10/2018 1721   PROTEINUR NEGATIVE 01/10/2018 1721   UROBILINOGEN 0.2 12/23/2017 1242   NITRITE NEGATIVE 01/10/2018 1721   LEUKOCYTESUR NEGATIVE 01/10/2018 1721   Sepsis Labs: Invalid input(s): PROCALCITONIN, LACTICIDVEN  Recent Results (from the past 240 hour(s))  Urine culture     Status: None   Collection Time: 01/10/18  3:04 PM  Result Value Ref Range Status   Specimen Description URINE, RANDOM  Final   Special Requests NONE  Final   Culture   Final    NO GROWTH Performed at [facility], [address], [place], [place] [address]    Report Status 01/11/2018 FINAL  Final  Blood Culture (routine x 2)     Status: None (Preliminary result)   Collection Time: 01/10/18  3:10 PM  Result Value Ref Range Status   Specimen Description BLOOD LEFT HAND  Final   Special Requests   Final    BOTTLES DRAWN AEROBIC AND ANAEROBIC Blood Culture adequate volume   Culture   Final    NO GROWTH 2 DAYS Performed at [facility], [place] [address] [place], [place] [address]    Report Status PENDING  Incomplete  Blood Culture (routine x 2)     Status: None (Preliminary result)   Collection Time: 01/10/18  3:16 PM  Result Value Ref Range Status   Specimen Description BLOOD RIGHT ANTECUBITAL  Final   Special Requests   Final    BOTTLES DRAWN AEROBIC AND ANAEROBIC Blood Culture adequate volume   Culture   Final    NO  GROWTH 2 DAYS Performed at [facility], [place] [address] [place], [place] [address]    Report Status PENDING  Incomplete  CSF culture     Status: None (Preliminary result)   Collection Time: 01/10/18  5:41 PM  Result Value Ref Range Status   Specimen Description CSF  Final   Special Requests NONE  Final   Gram Stain   Final    CYTOSPIN SMEAR WBC PRESENT, PREDOMINANTLY MONONUCLEAR NO ORGANISMS SEEN    Culture   Final    NO GROWTH 3 DAYS Performed at [facility], [place] [address], [place], [place] [address]    Report Status PENDING  Incomplete      Radiology Studies: Ct Head Wo Contrast  Result Date: 01/12/2018 CLINICAL DATA:  Acute onset of nausea and vomiting. Nystagmus. EXAM: CT HEAD WITHOUT CONTRAST TECHNIQUE: Contiguous axial images were obtained from the base of the skull through the vertex without intravenous contrast. COMPARISON:  CT of the head performed 01/10/2018, and MRI of the brain performed 01/11/2018 FINDINGS: Brain: No evidence of acute infarction, hemorrhage, hydrocephalus, extra-axial collection or mass lesion / mass effect. Prominence of the ventricles and sulci reflects mild cortical volume loss. Mild periventricular white matter change likely reflects small vessel ischemic microangiopathy. The brainstem and fourth ventricle are within normal limits. The basal ganglia are unremarkable in appearance. The cerebral hemispheres demonstrate grossly normal gray-white differentiation. No mass effect or midline shift is seen. Vascular: No hyperdense vessel or unexpected calcification. Skull: There is no evidence of fracture; visualized osseous structures are unremarkable in appearance. Sinuses/Orbits: The visualized portions of the orbits are within normal limits. The paranasal sinuses and mastoid air cells are well-aerated. Other: No significant soft tissue abnormalities are seen. IMPRESSION: 1. No acute intracranial pathology seen on CT. 2. Mild cortical volume loss  and scattered small vessel ischemic microangiopathy. Electronically Signed   By: Garald Balding M.D.   On: 01/12/2018 06:28   Mr Albertina [place] OA Contrast  Result Date: 01/13/2018 CLINICAL DATA:  Severe vertigo.  Acute onset nystagmus, nausea EXAM: MRI HEAD WITHOUT AND WITH CONTRAST TECHNIQUE: Multiplanar, multiecho pulse sequences of the brain and surrounding structures were obtained without and with intravenous contrast. CONTRAST:  25mL MULTIHANCE GADOBENATE DIMEGLUMINE 529 MG/ML IV SOLN COMPARISON:  MRI head 01/11/2018, CT 01/12/2018 FINDINGS: Brain: IAC protocol was performed including thin section imaging through the posterior fossa before and after intravenous contrast. Seventh and eighth cranial nerves normal. No enhancing mass lesion. Basilar cisterns normal. Mastoid sinus is clear bilaterally. Brainstem and cerebellum normal. No enhancing mass in the temporal bone or posterior fossa. Normal venous enhancement. Mild atrophy. Ventricle size normal. Negative for acute infarct. Mild chronic microvascular ischemic change in the white matter. Negative for hemorrhage or mass. Vascular: Normal arterial flow void Skull and upper cervical spine: Negative Sinuses/Orbits: Mild mucosal edema paranasal sinuses.  Normal orbit Other: None IMPRESSION: No cause for vertigo or nystagmus identified. Negative for acute infarct or mass. Mild chronic microvascular ischemic change in the white matter. Electronically Signed   By: Franchot Gallo M.D.   On: 01/13/2018 07:20     Scheduled Meds: . B-complex with vitamin C  1 tablet Oral Daily  . baclofen  10 mg Oral QHS  . ezetimibe  10 mg Oral Daily  . FLUoxetine  40 mg Oral Daily  . loratadine  10 mg Oral Daily  . predniSONE  60 mg Oral Q breakfast   Continuous Infusions:   Carita Pian PA-S    Marzetta Board, MD, PhD [facility] Pager [phone]  If 7PM-7AM, please contact night-coverage [email or web address] Password [email or web address] 01/13/2018, 12:10 PM

## 2018-01-13 NOTE — Progress Notes (Signed)
Inpatient Rehabilitation  Per PT request, patient was screened by Gunnar Fusi for appropriateness for an Inpatient Acute Rehab consult.  Note that patient is currently Min A and will need to be Mod I /Independent prior to returning home.  At this time we are recommending an Inpatient Rehab consult so that our MD can determine this.  Please order if you are agreeable.    Carmelia Roller., CCC/SLP Admission Coordinator  [place]  Cell [phone]

## 2018-01-13 NOTE — Care Management Important Message (Signed)
Important Message  Patient Details  Name: Carly Rhodes MRN: [id number] Date of Birth: 02/11/1946   Medicare Important Message Given:  Yes    Bina Veenstra [address] 01/13/2018, 3:14 PM

## 2018-01-13 NOTE — Progress Notes (Signed)
Patient not seen Platelet count improving since discontinuation of vancomycin and heparin OK to start anticoagulation as needed for atrial fibrillation: will defer to primary service/cardiologist Will sign off

## 2018-01-13 NOTE — Consult Note (Addendum)
Cardiology Consultation:   Patient ID: PHALLON HAYDU; [id number]; [birth year]   Admit date: 01/10/2018 Date of Consult: 01/13/2018  Primary Care Provider: Noralee Space, MD Primary Cardiologist: new to Dr. Marlou Porch Primary Electrophysiologist:  New, Dr. Lovena Le   Patient Profile:   Carly Rhodes is a 72 y.o. female with a hx of HSV 1, anxiety, chronic pain on a number of pain meds and benzo, no other known medical hx who is being seen today for the evaluation of possible tachy-brady, new onset AF at the request of Dr. Marlou Porch.  History of Present Illness:   Carly Rhodes was admitted to [facility] with AMS,  Febrile.  She had 2 weeks prior developed symptoms of weakness, fatigue, felt likely have viral syndrome by out patient evaluation, though symptoms progressed and recommended to come to the hospital though declined (worried about leaving her husband at home alone) and started on Abx.  Eventually reports of a syncopal event, progressive AMS and family insisted she come in.  In the ER Temperature is 102.4, pulse rate 147, respiratory of 31, blood pressure 93/65, Oxistat 90% on room air. She has a white count of 2.3 with neutropenia, platelets 69, sodium 1:30, potassium 3.3, chloride 98. Patient's creatinine is 0.82.  Neuro was brought on board, (LP was done and negative), maintained on antibiotic and acyclovir, cardiology consulted for AFib w.RVR.  She was  Started on rate control, with thrombocytopenia, anticoagulation has not been started, duration ofher AF is unclear.  LABS K+ 3.5 BUN/Creat 9/0.64 WBC 11.4 today (slightly up some) H/H 13/39 Plts  69 >> 114  The patient denies hx of syncope previously.  The day she come to the hospital though she thinks she fell in the bathroom, but is not sure, she knows she was to weak to get off the floor for a couple hours.  She denies any kind of CP, palpitations historically, with her current illness, while here or now.  While here she reports  yesterday all day long she was very dizzy, on/off nausea with the dizziness.  Particularly when moving her head side to side.  In general, she is doing better since admission, though remains feeling "very tired and weak"   Past Medical History:  Diagnosis Date  . Anxiety   . Depression   . Family history of anesthesia complication    sister extreme nausea & vomiting  . 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 Osteoporosis   . Seasonal allergies     Past Surgical History:  Procedure Laterality Date  . ABDOMINAL HYSTERECTOMY  1984  . APPENDECTOMY  1984   at time of Hysterectomy  . HAMMER TOE SURGERY  1993   2nd toe left foot  . THYROID LOBECTOMY  09/15/2012   Procedure: THYROID LOBECTOMY;  Surgeon: Melissa Montane, MD;  Location: [facility];  Service: ENT;  Laterality: Left;  . THYROIDECTOMY, PARTIAL  09/15/2012   Dr Janace Hoard  . TONSILLECTOMY  1957       Inpatient Medications: Scheduled Meds: . B-complex with vitamin C  1 tablet Oral Daily  . baclofen  10 mg Oral QHS  . ezetimibe  10 mg Oral Daily  . FLUoxetine  40 mg Oral Daily  . loratadine  10 mg Oral Daily  . predniSONE  60 mg Oral Q breakfast   Continuous Infusions:  PRN Meds: acetaminophen, ondansetron **OR** ondansetron (ZOFRAN) IV, traMADol  Allergies:    Allergies  Allergen Reactions  . Procaine Other (See Comments)  REACTION:  Lowers blood pressure, "makes me loopy, can't move or talk"    Social History:   Social History   Socioeconomic History  . Marital status: Divorced    Spouse name: Not on file  . Number of children: Not on file  . Years of education: Not on file  . Highest education level: Not on file  Occupational History  . Not on file  Social Needs  . Financial resource strain: Not on file  . Food insecurity:    Worry: Not on file    Inability: Not on file  . Transportation needs:    Medical: Not on file    Non-medical: Not on file  Tobacco Use  . Smoking status: Never Smoker  . Smokeless  tobacco: Never Used  Substance and Sexual Activity  . Alcohol use: No  . Drug use: No  . Sexual activity: Not Currently    Birth control/protection: Post-menopausal, Surgical  Lifestyle  . Physical activity:    Days per week: Not on file    Minutes per session: Not on file  . Stress: Not on file  Relationships  . Social connections:    Talks on phone: Not on file    Gets together: Not on file    Attends religious service: Not on file    Active member of club or organization: Not on file    Attends meetings of clubs or organizations: Not on file    Relationship status: Not on file  . Intimate partner violence:    Fear of current or ex partner: Not on file    Emotionally abused: Not on file    Physically abused: Not on file    Forced sexual activity: Not on file  Other Topics Concern  . Not on file  Social History Narrative  . Not on file    Family History:   Family History  Problem Relation Age of Onset  . Stomach cancer Brother 33  . Colon cancer Paternal Grandmother 13  . Diabetes Father   . Hypertension Sister   . Heart attack Neg Hx      ROS:  Please see the history of present illness.  All other ROS reviewed and negative.     Physical Exam/Data:   Vitals:   01/13/18 0003 01/13/18 0534 01/13/18 0840 01/13/18 1122  BP: 93/70 118/76 133/81 133/83  Pulse: 97 (!) 117 91 97  Resp: _0 Temp: 98.4 F (36.9 C) 98 F (36.7 C) 97.6 F (36.4 C) 97.6 F (36.4 C)  TempSrc: Oral Oral Oral Oral  SpO2: 94% 97% 97% 95%  Weight:      Height:        Intake/Output Summary (Last 24 hours) at 01/13/2018 1123 Last data filed at 01/13/2018 0936 Gross per 24 hour  Intake 858 ml  Output 1600 ml  Net -742 ml   Filed Weights   01/10/18 1457 01/10/18 2029  Weight: 177 lb (80.3 kg) 175 lb 14.8 oz (79.8 kg)   Body mass index is 27.97 kg/m.  General:  Well nourished, well developed, in no acute distress HEENT: normal Lymph: no adenopathy Neck: no JVD Endocrine:   No thryomegaly Vascular: No carotid bruits; FA pulses 2+ bilaterally without bruits  Cardiac:  iRRR; no murmurs, gallops or rubs Lungs:  CTA b/l, no wheezing, rhonchi or rales  Abd: soft, nontender  Ext: no edema Musculoskeletal:  No deformities Skin: warm and dry  Neuro:  no focal abnormalities noted Psych:  Normal affect   EKG:  The EKG was personally reviewed and demonstrates:   AFib, 126bpm (no old EKGs) Telemetry:  Telemetry was personally reviewed and demonstrates:   AFib 120's >> 90's-110 range in the lst day or so.  She has had a number of brady episodes   01/12/18 between 1AM-3AM, at least one was said to have been associated with vomiting, this with a pauses, 5.7 and 3.6 seconds, with recurrent brady again afterwards,w/ 2.4 sec pause About 6 AM 4.47sec pause/brady (unknown activities/symptoms) About 2pm brady/pause 5.3sec (unknown activities/symptoms) This AM about 6am brady/pause 2.2 seconds  Brady events are fairly transient 30-45 seconds, rates dip to about 25-35,  difficult though are fairly sudden onset/offset   Relevant CV Studies:  Echocardiogram 01/11/18: Study Conclusions - Left ventricle: The cavity size was normal. Systolic function was normal. The estimated ejection fraction was in the range of 55% to 60%. Wall motion was normal; there were no regional wall motion abnormalities. - Aortic valve: Transvalvular velocity was within the normal range. There was no stenosis. There was no regurgitation. Valve area (VTI): 1.97 cm^2. Valve area (Vmax): 2.23 cm^2. Valve area (Vmean): 2.19 cm^2. - Mitral valve: Transvalvular velocity was within the normal range. There was no evidence for stenosis. There was mild regurgitation. Valve area by pressure half-time: 1.26 cm^2. - Left atrium: The atrium was severely dilated. - Right ventricle: The cavity size was normal. Wall thickness was normal. Systolic function was normal. - Tricuspid valve: There  was trivial regurgitation. - Pulmonary arteries: Systolic pressure was within the normal range. PA peak pressure: 21 mm Hg (S).    Laboratory Data:  Chemistry Recent Labs  Lab 01/10/18 1514 01/11/18 0610  NA 130* 141  K 3.3* 3.5  CL 98* 110  CO2 21* 21*  GLUCOSE 95 143*  BUN 11 9  CREATININE 0.82 0.64  CALCIUM 8.1* 7.9*  GFRNONAA >60 >60  GFRAA >60 >60  ANIONGAP 11 10    Recent Labs  Lab 01/10/18 1514 01/11/18 0610  PROT 5.7* 5.5*  ALBUMIN 3.1* 2.8*  AST 81* 73*  ALT 50 48  ALKPHOS 59 56  BILITOT 1.7* 1.1   Hematology Recent Labs  Lab 01/11/18 0610 01/12/18 0636 01/13/18 0745  WBC 4.1 5.8 11.4*  RBC 5.36* 4.79 5.05  HGB 14.6 13.2 13.7  HCT 43.2 38.6 39.7  MCV 80.6 80.6 78.6  MCH 27.2 27.6 27.1  MCHC 33.8 34.2 34.5  RDW 13.5 14.0 14.0  PLT 75* 62* 114*   Cardiac EnzymesNo results for input(s): TROPONINI in the last 168 hours. No results for input(s): TROPIPOC in the last 168 hours.  BNPNo results for input(s): BNP, PROBNP in the last 168 hours.  DDimer No results for input(s): DDIMER in the last 168 hours.  Radiology/Studies:   Dg Chest 2 View Result Date: 01/10/2018 CLINICAL DATA:  Fever, altered mental status. EXAM: CHEST - 2 VIEW COMPARISON:  None. FINDINGS: Enlarged cardiac silhouette. Calcific atherosclerotic disease of the aorta and tortuosity. Mediastinal contours appear intact. There is no evidence of focal airspace consolidation, pleural effusion or pneumothorax. Low lung volumes with exaggeration of the interstitial markings. Thoracic kyphosis with stable changes of spondylosis of the thoracic spine. Soft tissues are grossly normal. IMPRESSION: No active cardiopulmonary disease. Enlarged heart. Electronically Signed   By: Fidela Salisbury M.D.   On: 01/10/2018 16:04    Ct Head Wo Contrast Result Date: 01/12/2018 CLINICAL DATA:  Acute onset of nausea and vomiting. Nystagmus. EXAM: CT HEAD WITHOUT CONTRAST  TECHNIQUE: Contiguous axial images  were obtained from the base of the skull through the vertex without intravenous contrast. COMPARISON:  CT of the head performed 01/10/2018, and MRI of the brain performed 01/11/2018 FINDINGS: Brain: No evidence of acute infarction, hemorrhage, hydrocephalus, extra-axial collection or mass lesion / mass effect. Prominence of the ventricles and sulci reflects mild cortical volume loss. Mild periventricular white matter change likely reflects small vessel ischemic microangiopathy. The brainstem and fourth ventricle are within normal limits. The basal ganglia are unremarkable in appearance. The cerebral hemispheres demonstrate grossly normal gray-white differentiation. No mass effect or midline shift is seen. Vascular: No hyperdense vessel or unexpected calcification. Skull: There is no evidence of fracture; visualized osseous structures are unremarkable in appearance. Sinuses/Orbits: The visualized portions of the orbits are within normal limits. The paranasal sinuses and mastoid air cells are well-aerated. Other: No significant soft tissue abnormalities are seen. IMPRESSION: 1. No acute intracranial pathology seen on CT. 2. Mild cortical volume loss and scattered small vessel ischemic microangiopathy. Electronically Signed   By: Garald Balding M.D.   On: 01/12/2018 06:28      Mr Brain W And Wo Contrast Result Date: 01/11/2018 CLINICAL DATA:  Altered mental status EXAM: MRI HEAD WITHOUT AND WITH CONTRAST TECHNIQUE: Multiplanar, multiecho pulse sequences of the brain and surrounding structures were obtained without and with intravenous contrast. CONTRAST:  98m MULTIHANCE GADOBENATE DIMEGLUMINE 529 MG/ML IV SOLN COMPARISON:  Head CT 01/10/2018 FINDINGS: BRAIN: The midline structures are normal. There is no acute infarct or acute hemorrhage. There is no mass lesion or other mass effect. There is no hydrocephalus, dural abnormality or extra-axial collection. Multifocal white matter hyperintensity, most commonly due  to chronic ischemic microangiopathy. No age-advanced or lobar predominant atrophy. No chronic microhemorrhage or superficial siderosis. VASCULAR: Major intracranial arterial and venous sinus flow voids are preserved. SKULL AND UPPER CERVICAL SPINE: The visualized skull base, calvarium, upper cervical spine and extracranial soft tissues are normal. SINUSES/ORBITS: No fluid levels or advanced mucosal thickening. No mastoid or middle ear effusion. The orbits are normal. 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 Electronically Signed   By: KUlyses JarredM.D.   On: 01/11/2018 04:43   Mr BAlbertina SenegalWZWContrast Result Date: 01/13/2018 CLINICAL DATA:  Severe vertigo.  Acute onset nystagmus, nausea EXAM: MRI HEAD WITHOUT AND WITH CONTRAST TECHNIQUE: Multiplanar, multiecho pulse sequences of the brain and surrounding structures were obtained without and with intravenous contrast. CONTRAST:  155mMULTIHANCE GADOBENATE DIMEGLUMINE 529 MG/ML IV SOLN COMPARISON:  MRI head 01/11/2018, CT 01/12/2018 FINDINGS: Brain: IAC protocol was performed including thin section imaging through the posterior fossa before and after intravenous contrast. Seventh and eighth cranial nerves normal. No enhancing mass lesion. Basilar cisterns normal. Mastoid sinus is clear bilaterally. Brainstem and cerebellum normal. No enhancing mass in the temporal bone or posterior fossa. Normal venous enhancement. Mild atrophy. Ventricle size normal. Negative for acute infarct. Mild chronic microvascular ischemic change in the white matter. Negative for hemorrhage or mass. Vascular: Normal arterial flow void Skull and upper cervical spine: Negative Sinuses/Orbits: Mild mucosal edema paranasal sinuses.  Normal orbit Other: None IMPRESSION: No cause for vertigo or nystagmus identified. Negative for acute infarct or mass. Mild chronic microvascular ischemic change in the white matter. Electronically Signed   By: ChFranchot Gallo.D.   On: 01/13/2018 07:20    Assessment and Plan:   1. AFib, new for patient of unknown duration     Initially RVR, strted on BB, she  apparently was observed to have 4.5 sec pause associated with vomiting episode without syncope yesterday, though BB was reduced     She had another pause yesterday afternoon, though not apparently associated with any symptoms/activities and BB was stopped  2. Bradycardia, pauses. These have been episodic, at least once can be associated with retching/vomiting  She has evidence of tachy-brady, though given an acute febrile illness, a slight up-tic in her WBC, would like to hold off pacer implant if able.  Her BB last dose was yesterday AM, would monitor closely on telemetry for now.  She has been afebrile for the last 24 hours.  If she hs worsening brady issues, may need to proceed with pacing.       2. Encephalopathy     Per IM: Probably in the setting of fever/nonspecific viral infection     uine w/E. Coli     Neuro notes: most likely explanation is viral syndrome with delirium with post-viral labyrinthitis, pending start of steroid regime.   3. Thrombocytopenia      Per IM note, discussed with Dr.Gorsuch, unlikely TTP, smear without schistocytes and appears normal  -Potential offending agent/vancomycin was discontinued on 5/28       plts are improving       Will defer a/c choice/timing to hemo-onc, and primary cards.  Would avoid [employer] for now, with possible pacer in near future, ?? Heparin candidate   For questions or updates, please contact [facility] Please consult [email or web address] for contact info under Cardiology/STEMI.   Signed, Baldwin Jamaica, PA-C  01/13/2018 11:23 AM   EP Attending  Patient seen and examined. Agree with above. The patient is referred for evaluation of newly diagnosed atrial fib with a RVR but also episodes of a very slow VR while on beta blocker therapy. She has an indication for PPM but currently not a great  candidate with prior fever, persistently elevated WBC, ongoing anti-biotics and recent initiation of steroids. I would try and hold off on PPM until the infection has been treated. She has previously been mostly symptomatic except for an isolated episode of altered consciousness. We will follow with you. Do not restart her beta blocker at this time.  Mikle Bosworth.D.

## 2018-01-13 NOTE — Progress Notes (Addendum)
Progress Note  Patient Name: Carly Rhodes Date of Encounter: 01/13/2018  Primary Cardiologist: No primary care provider on file.   Subjective   Denies any new complaints. States she is feeling better overall but still with some dizziness and sluggishness. Patient was questioned on activities around 2:50pm yesterday when a pause was noted on telemetry. She does not recall feeling pre-syncopal at that time. She denies chest pain, SOB, or palpitations.   Inpatient Medications    Scheduled Meds: . B-complex with vitamin C  1 tablet Oral Daily  . baclofen  10 mg Oral QHS  . ezetimibe  10 mg Oral Daily  . FLUoxetine  40 mg Oral Daily  . loratadine  10 mg Oral Daily  . predniSONE  60 mg Oral Q breakfast   Continuous Infusions:  PRN Meds: acetaminophen, ondansetron **OR** ondansetron (ZOFRAN) IV, traMADol   Vital Signs    Vitals:   01/12/18 1951 01/13/18 0003 01/13/18 0534 01/13/18 0840  BP: 107/73 93/70 118/76 133/81  Pulse: 81 97 (!) 117 91  Resp: 18 18 18 20   Temp: 97.9 F (36.6 C) 98.4 F (36.9 C) 98 F (36.7 C) 97.6 F (36.4 C)  TempSrc: Oral Oral Oral Oral  SpO2: 97% 94% 97% 97%  Weight:      Height:        Intake/Output Summary (Last 24 hours) at 01/13/2018 1012 Last data filed at 01/13/2018 0936 Gross per 24 hour  Intake 858 ml  Output 1600 ml  Net -742 ml   Filed Weights   01/10/18 1457 01/10/18 2029  Weight: 177 lb (80.3 kg) 175 lb 14.8 oz (79.8 kg)    Telemetry    Atrial fibrillation with intermittent RVR. Episode of bradycardia yesterday around 2:50pm accompanied by a 5.2 second pause. No recurrence of bradycardia since that time. Was off the monitor for ~1 hour for MRI this morning. - Personally Reviewed  Physical Exam   GEN: Sitting in bedside chair in no acute distress.   Neck: No JVD, no carotid bruits Cardiac: IRIR, no murmurs, rubs, or gallops.  Respiratory: Clear to auscultation bilaterally, no wheezes/ rales/ rhonchi GI: NABS, Soft,  nontender, non-distended  MS: No edema; No deformity. Neuro:  Nonfocal, moving all extremities spontaneously Psych: Normal affect   Labs    Chemistry Recent Labs  Lab 01/06/18 1123 01/10/18 1514 01/11/18 0610  NA 135 130* 141  K 4.1 3.3* 3.5  CL 100 98* 110  CO2 28 21* 21*  GLUCOSE 111* 95 143*  BUN 12 11 9   CREATININE 0.85 0.82 0.64  CALCIUM 9.6 8.1* 7.9*  PROT 7.0 5.7* 5.5*  ALBUMIN 4.1 3.1* 2.8*  AST 23 81* 73*  ALT 19 50 48  ALKPHOS 78 59 56  BILITOT 0.9 1.7* 1.1  GFRNONAA  --  >60 >60  GFRAA  --  >60 >60  ANIONGAP  --  11 10     Hematology Recent Labs  Lab 01/11/18 0610 01/12/18 0636 01/13/18 0745  WBC 4.1 5.8 11.4*  RBC 5.36* 4.79 5.05  HGB 14.6 13.2 13.7  HCT 43.2 38.6 39.7  MCV 80.6 80.6 78.6  MCH 27.2 27.6 27.1  MCHC 33.8 34.2 34.5  RDW 13.5 14.0 14.0  PLT 75* 62* PENDING    Cardiac EnzymesNo results for input(s): TROPONINI in the last 168 hours. No results for input(s): TROPIPOC in the last 168 hours.   BNPNo results for input(s): BNP, PROBNP in the last 168 hours.   DDimer No results for  input(s): DDIMER in the last 168 hours.   Radiology    Ct Head Wo Contrast  Result Date: 01/12/2018 CLINICAL DATA:  Acute onset of nausea and vomiting. Nystagmus. EXAM: CT HEAD WITHOUT CONTRAST TECHNIQUE: Contiguous axial images were obtained from the base of the skull through the vertex without intravenous contrast. COMPARISON:  CT of the head performed 01/10/2018, and MRI of the brain performed 01/11/2018 FINDINGS: Brain: No evidence of acute infarction, hemorrhage, hydrocephalus, extra-axial collection or mass lesion / mass effect. Prominence of the ventricles and sulci reflects mild cortical volume loss. Mild periventricular white matter change likely reflects small vessel ischemic microangiopathy. The brainstem and fourth ventricle are within normal limits. The basal ganglia are unremarkable in appearance. The cerebral hemispheres demonstrate grossly normal  gray-white differentiation. No mass effect or midline shift is seen. Vascular: No hyperdense vessel or unexpected calcification. Skull: There is no evidence of fracture; visualized osseous structures are unremarkable in appearance. Sinuses/Orbits: The visualized portions of the orbits are within normal limits. The paranasal sinuses and mastoid air cells are well-aerated. Other: No significant soft tissue abnormalities are seen. IMPRESSION: 1. No acute intracranial pathology seen on CT. 2. Mild cortical volume loss and scattered small vessel ischemic microangiopathy. Electronically Signed   By: Garald Balding M.D.   On: 01/12/2018 06:28   Mr Albertina [place] YI Contrast  Result Date: 01/13/2018 CLINICAL DATA:  Severe vertigo.  Acute onset nystagmus, nausea EXAM: MRI HEAD WITHOUT AND WITH CONTRAST TECHNIQUE: Multiplanar, multiecho pulse sequences of the brain and surrounding structures were obtained without and with intravenous contrast. CONTRAST:  33mL MULTIHANCE GADOBENATE DIMEGLUMINE 529 MG/ML IV SOLN COMPARISON:  MRI head 01/11/2018, CT 01/12/2018 FINDINGS: Brain: IAC protocol was performed including thin section imaging through the posterior fossa before and after intravenous contrast. Seventh and eighth cranial nerves normal. No enhancing mass lesion. Basilar cisterns normal. Mastoid sinus is clear bilaterally. Brainstem and cerebellum normal. No enhancing mass in the temporal bone or posterior fossa. Normal venous enhancement. Mild atrophy. Ventricle size normal. Negative for acute infarct. Mild chronic microvascular ischemic change in the white matter. Negative for hemorrhage or mass. Vascular: Normal arterial flow void Skull and upper cervical spine: Negative Sinuses/Orbits: Mild mucosal edema paranasal sinuses.  Normal orbit Other: None IMPRESSION: No cause for vertigo or nystagmus identified. Negative for acute infarct or mass. Mild chronic microvascular ischemic change in the white matter. Electronically  Signed   By: Franchot Gallo M.D.   On: 01/13/2018 07:20    Cardiac Studies   Echocardiogram 01/11/18: Study Conclusions  - Left ventricle: The cavity size was normal. Systolic function was normal. The estimated ejection fraction was in the range of 55% to 60%. Wall motion was normal; there were no regional wall motion abnormalities. - Aortic valve: Transvalvular velocity was within the normal range. There was no stenosis. There was no regurgitation. Valve area (VTI): 1.97 cm^2. Valve area (Vmax): 2.23 cm^2. Valve area (Vmean): 2.19 cm^2. - Mitral valve: Transvalvular velocity was within the normal range. There was no evidence for stenosis. There was mild regurgitation. Valve area by pressure half-time: 1.26 cm^2. - Left atrium: The atrium was severely dilated. - Right ventricle: The cavity size was normal. Wall thickness was normal. Systolic function was normal. - Tricuspid valve: There was trivial regurgitation. - Pulmonary arteries: Systolic pressure was within the normal range. PA peak pressure: 21 mm Hg (S).    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 Cardiology consulted for atrial fibrillation with RVR  Assessment & Plan    1. Atrial fibrillation with RVR: Still with intermittent RVR. Metoprolol discontinued yesterday after patient experienced another pause, this time in the absence of vasovagal event. Anticoagulation deferred given thrombocytopenia, now improving. She is afebrile and mental status is improving.  - CHADS2VASc score of 2 (age and female) - would ultimately benefit from anticoagulation for stroke prevention, although limited by thrombocytopenia.  - Should PLT remain stable/continue to improve tomorrow, could consider starting eliquis BID.  - EP to evaluate today for possible tachy/brady syndrome  2. Pause on telemetry: patient with 4.5 second  pause while at CT scan 5/29. She reported an episode of vomiting at the time of documented pause. She did not lose consciousness. Suspect the pause was 2/2 a vasovagal event. Unfortunately she had a second pause (5.2 seconds) noted yesterday in the absence of vasovagal event. Metoprolol discontinued yesterday. She has not had any recurrence of bradycardia since discontinuation of BBlocker - Continue to monitor on telemetry.  - Avoid AV nodal blocking agents - EP to evaluate today - will make NPO just in case PPM indicated  3. AMS/ encephalopathy: Neurology following. LP negative for acute findings. CT Head/MRI with chronic ischemic microvascular changes; no acute findings. Repeat MRI negative. Starting on steroids taper today. Neurology recommending discontinuation of antivirals.  - Continue management per primary team and Neurology   4. Thrombocytopenia: PLT improved to 114 today. Hematology following and thought likely 2/2 medications - vancomycin vs heparin (both discontinued 5/28). ASA discontinued yesterday - Will defer anticoagulation for stroke prevention at this time - Continue management per primary team and hematology  5. HLD: - Continue zetia    For questions or updates, please contact [place] Please consult [email or web address] for contact info under Cardiology/STEMI.      Signed, Abigail Butts, PA-C  01/13/2018, 10:12 AM   [phone]  Personally seen and examined. Agree with above.  Feels better, clearer. No CP, no SOB. Nausea gone.  GEN: Well nourished, well developed, in no acute distress  HEENT: normal  Neck: no JVD, carotid bruits, or masses Cardiac: irreg; no murmurs, rubs, or gallops,no edema  Respiratory:  clear to auscultation bilaterally, normal work of breathing GI: soft, nontender, nondistended, + BS MS: no deformity or atrophy  Skin: warm and dry, no rash Neuro:  Alert and Oriented x 3, Strength and sensation are intact Psych: euthymic mood,  full affect  Labs personally viewed - PLT's improving TELE: 5.2 sec pause yesterday 132pm   A/P  Persistent AFIB  - Hematology states OK to anticoagulate. Will wait for EP consult (in case pacer is thought to be needed)  - Reasonable rate control without meds at this point  Pause  - occurred in daytime hours. Last pause 5.2 sec, does not recall symptoms associated. Yesterday and day prior had nausea (common culprit for vagal mediated pauses). EP to see. Ultimately may need pacer for tachy brady syndrome but waiting may be prudent)  Candee Furbish, MD

## 2018-01-14 LAB — CSF CULTURE W GRAM STAIN: Culture: NO GROWTH

## 2018-01-14 LAB — BASIC METABOLIC PANEL
ANION GAP: 6 (ref 5–15)
BUN: 8 mg/dL (ref 6–20)
CO2: 23 mmol/L (ref 22–32)
Calcium: 7.3 mg/dL — ABNORMAL LOW (ref 8.9–10.3)
Chloride: 107 mmol/L (ref 101–111)
Creatinine, Ser: 0.6 mg/dL (ref 0.44–1.00)
Glucose, Bld: 126 mg/dL — ABNORMAL HIGH (ref 65–99)
POTASSIUM: 3.3 mmol/L — AB (ref 3.5–5.1)
Sodium: 136 mmol/L (ref 135–145)

## 2018-01-14 LAB — CBC
HEMATOCRIT: 38.4 % (ref 36.0–46.0)
HEMOGLOBIN: 13.1 g/dL (ref 12.0–15.0)
MCH: 27.2 pg (ref 26.0–34.0)
MCHC: 34.1 g/dL (ref 30.0–36.0)
MCV: 79.8 fL (ref 78.0–100.0)
Platelets: 163 10*3/uL (ref 150–400)
RBC: 4.81 MIL/uL (ref 3.87–5.11)
RDW: 14.2 % (ref 11.5–15.5)
WBC: 15 10*3/uL — AB (ref 4.0–10.5)

## 2018-01-14 MED ORDER — POTASSIUM CHLORIDE CRYS ER 20 MEQ PO TBCR
60.0000 meq | EXTENDED_RELEASE_TABLET | Freq: Once | ORAL | Status: AC
  Administered 2018-01-14: 60 meq via ORAL
  Filled 2018-01-14: qty 3

## 2018-01-14 MED ORDER — APIXABAN 5 MG PO TABS
5.0000 mg | ORAL_TABLET | Freq: Two times a day (BID) | ORAL | Status: DC
  Administered 2018-01-14 – 2018-01-15 (×3): 5 mg via ORAL
  Filled 2018-01-14 (×3): qty 1

## 2018-01-14 MED ORDER — AMIODARONE HCL 200 MG PO TABS
200.0000 mg | ORAL_TABLET | Freq: Every day | ORAL | Status: DC
  Administered 2018-01-14 – 2018-01-15 (×2): 200 mg via ORAL
  Filled 2018-01-14 (×2): qty 1

## 2018-01-14 NOTE — Patient Instructions (Signed)
Gaze Stabilization: Sitting    Keeping eyes on target on wall 5-10 feet away, tilt head down 15-30 and move head side to side for _30-60___ seconds. Repeat while moving head up and down for 30-60____ seconds. Do _4___ sessions per day.   Copyright  VHI. All rights reserved.  Gaze Stabilization: Tip Card  1.Target must remain in focus, not blurry, and appear stationary while head is in motion. 2.Perform exercises with small head movements (45 to either side of midline). 3.Increase speed of head motion so long as target is in focus. 4.If you wear eyeglasses, be sure you can see target through lens (therapist will give specific instructions for bifocal / progressive lenses). 5.These exercises may provoke dizziness or nausea. Work through these symptoms. If too dizzy, slow head movement slightly. Rest between each exercise. 6.Exercises demand concentration; avoid distractions. 7.For safety, perform standing exercises close to a counter, wall, corner, or next to someone.  Copyright  VHI. All rights reserved.

## 2018-01-14 NOTE — Progress Notes (Addendum)
Progress Note  Patient Name: Carly Rhodes Date of Encounter: 01/14/2018  Primary Cardiologist: Candee Furbish, MD   Subjective   Patient was happy to hear that she was back in a normal heart rhythm. Disappointed to have to spend time in rehab but understand why she is recommended to go. Denies chest pain, SOB, or palpitations.   Inpatient Medications    Scheduled Meds: . amiodarone  200 mg Oral Daily  . apixaban  5 mg Oral BID  . B-complex with vitamin C  1 tablet Oral Daily  . baclofen  10 mg Oral QHS  . ezetimibe  10 mg Oral Daily  . FLUoxetine  40 mg Oral Daily  . loratadine  10 mg Oral Daily  . predniSONE  60 mg Oral Q breakfast   Continuous Infusions:  PRN Meds: acetaminophen, ondansetron **OR** ondansetron (ZOFRAN) IV, traMADol   Vital Signs    Vitals:   01/13/18 2007 01/13/18 2347 01/14/18 0426 01/14/18 0736  BP: 132/86 125/86 122/74 (!) 148/92  Pulse: 100 (!) 108 85 92  Resp: 18 18 18 17   Temp: 97.8 F (36.6 C) 97.9 F (36.6 C) 98.7 F (37.1 C) 98.4 F (36.9 C)  TempSrc: Oral Oral Oral Oral  SpO2: 95% 96% 93% 97%  Weight:      Height:        Intake/Output Summary (Last 24 hours) at 01/14/2018 1206 Last data filed at 01/13/2018 1858 Gross per 24 hour  Intake 480 ml  Output -  Net 480 ml   Filed Weights   01/10/18 1457 01/10/18 2029  Weight: 177 lb (80.3 kg) 175 lb 14.8 oz (79.8 kg)    Telemetry    Converted to NSR around 3:20am this morning. - Personally Reviewed  Physical Exam   GEN: Sitting in bedside chair in no acute distress.   Neck: No JVD, no carotid bruits Cardiac: RRR, no murmurs, rubs, or gallops.  Respiratory: Clear to auscultation bilaterally, no wheezes/ rales/ rhonchi GI: NABS, Soft, nontender, non-distended  MS: No edema; No deformity. Neuro:  Nonfocal, moving all extremities spontaneously Psych: Normal affect   Labs    Chemistry Recent Labs  Lab 01/10/18 1514 01/11/18 0610 01/14/18 0419  NA 130* 141 136  K  3.3* 3.5 3.3*  CL 98* 110 107  CO2 21* 21* 23  GLUCOSE 95 143* 126*  BUN 11 9 8   CREATININE 0.82 0.64 0.60  CALCIUM 8.1* 7.9* 7.3*  PROT 5.7* 5.5*  --   ALBUMIN 3.1* 2.8*  --   AST 81* 73*  --   ALT 50 48  --   ALKPHOS 59 56  --   BILITOT 1.7* 1.1  --   GFRNONAA >60 >60 >60  GFRAA >60 >60 >60  ANIONGAP 11 10 6      Hematology Recent Labs  Lab 01/12/18 0636 01/13/18 0745 01/14/18 0419  WBC 5.8 11.4* 15.0*  RBC 4.79 5.05 4.81  HGB 13.2 13.7 13.1  HCT 38.6 39.7 38.4  MCV 80.6 78.6 79.8  MCH 27.6 27.1 27.2  MCHC 34.2 34.5 34.1  RDW 14.0 14.0 14.2  PLT 62* 114* 163    Cardiac EnzymesNo results for input(s): TROPONINI in the last 168 hours. No results for input(s): TROPIPOC in the last 168 hours.   BNPNo results for input(s): BNP, PROBNP in the last 168 hours.   DDimer No results for input(s): DDIMER in the last 168 hours.   Radiology    Mr Albertina [place] Wo Contrast  Result Date:  01/13/2018 CLINICAL DATA:  Severe vertigo.  Acute onset nystagmus, nausea EXAM: MRI HEAD WITHOUT AND WITH CONTRAST TECHNIQUE: Multiplanar, multiecho pulse sequences of the brain and surrounding structures were obtained without and with intravenous contrast. CONTRAST:  55mL MULTIHANCE GADOBENATE DIMEGLUMINE 529 MG/ML IV SOLN COMPARISON:  MRI head 01/11/2018, CT 01/12/2018 FINDINGS: Brain: IAC protocol was performed including thin section imaging through the posterior fossa before and after intravenous contrast. Seventh and eighth cranial nerves normal. No enhancing mass lesion. Basilar cisterns normal. Mastoid sinus is clear bilaterally. Brainstem and cerebellum normal. No enhancing mass in the temporal bone or posterior fossa. Normal venous enhancement. Mild atrophy. Ventricle size normal. Negative for acute infarct. Mild chronic microvascular ischemic change in the white matter. Negative for hemorrhage or mass. Vascular: Normal arterial flow void Skull and upper cervical spine: Negative Sinuses/Orbits:  Mild mucosal edema paranasal sinuses.  Normal orbit Other: None IMPRESSION: No cause for vertigo or nystagmus identified. Negative for acute infarct or mass. Mild chronic microvascular ischemic change in the white matter. Electronically Signed   By: Franchot Gallo M.D.   On: 01/13/2018 07:20    Cardiac Studies   Echocardiogram 01/11/18: Study Conclusions  - Left ventricle: The cavity size was normal. Systolic function was normal. The estimated ejection fraction was in the range of 55% to 60%. Wall motion was normal; there were no regional wall motion abnormalities. - Aortic valve: Transvalvular velocity was within the normal range. There was no stenosis. There was no regurgitation. Valve area (VTI): 1.97 cm^2. Valve area (Vmax): 2.23 cm^2. Valve area (Vmean): 2.19 cm^2. - Mitral valve: Transvalvular velocity was within the normal range. There was no evidence for stenosis. There was mild regurgitation. Valve area by pressure half-time: 1.26 cm^2. - Left atrium: The atrium was severely dilated. - Right ventricle: The cavity size was normal. Wall thickness was normal. Systolic function was normal. - Tricuspid valve: There was trivial regurgitation. - Pulmonary arteries: Systolic pressure was within the normal range. PA peak pressure: 21 mm Hg (S).    Patient Profile      72 y.o.femalewith recurrent HSV infections and no significant cardiac history, who presented with AMS and fever. Cardiology consulted for atrial fibrillation with RVR  Assessment & Plan    1. Atrial fibrillation with OVF:IEPPI with intermittent RVR. Metoprolol discontinued 01/12/18 after patient experienced another pause, this time in the absence of vasovagal event. Around 3:20am this morning, patient spontaneously converted to NSR and has had no recurrence of bradycardia or atrial fibrillation since that time. She was evaluated by EP who recommended amiodarone 200mg  daily. Eliquis 5mg  BID started  5/31 given resolution of thrombocytopenia  - Continue eliquis 5mg  BID for CHADS2VASc score of 2 (age and female) - Continue amiodarone 200mg  daily  2. Pause on telemetry:Multiple pauses during admission. Evaluated by EP given concerns for tachy-brady syndrome and need for PPM. Recommended for continued monitoring on telemetry and thankfully converted to NSR overnight. She has not had any recurrence of bradycardia since that time.  - Continue amiodarone 200mg  daily - Avoid AV nodal blocking agents  3. AMS/ encephalopathy:Neurology following. LP negative for acute findings. CT Head/MRI with chronic ischemic microvascular changes; no acute findings. Repeat MRI negative. Started on steroid taper 01/13/18 - Continue management per primary team and Neurology   4. Thrombocytopenia:resolved - PLT 163 today. Hematology thought likely 2/2 medications - vancomycin (discontinued 5/28). - Continue management per primary team and hematology  5. HLD: - Continue zetia   Will arrange outpatient follow up with  Dr. Lovena Le.    For questions or updates, please contact [place] Please consult [email or web address] for contact info under Cardiology/STEMI.      Signed, Abigail Butts, PA-C  01/14/2018, 12:06 PM   [phone]  Personally seen and examined. Agree with above.  She was happy to see Dr. Lovena Le as her husband also sees Dr. Lovena Le.  Currently she is not having any chest pain or shortness of breath.  No further pauses now that she is back in sinus rhythm.  Perhaps her atrial fibrillation is a result of her underlying viral illness.  We will continue with Eliquis 5 mg twice a day and amiodarone 200 mg a day.  Hopefully this will help suppress future episodes of atrial fibrillation.  Watch for any evidence of significant pauses.  Previously had a 5.2-second pause.  Thrombocytopenia is improving.  She is alert, improved mentation, regular rate and rhythm, lungs are clear.  Appreciate EP's  assistance, Dr. Lovena Le and Joseph Art.  We will hold off on pacemaker at this time.  Okay for transfer to rehab.  Candee Furbish, MD

## 2018-01-14 NOTE — Consult Note (Signed)
Physical Medicine and Rehabilitation Consult Reason for Consult: Decreased functional mobility related to fever and altered mental status Referring Physician: Cruzita Lederer   HPI: Carly Rhodes is a 72 y.o. right-handed female with history significant for chronic pain syndrome, recurrent HSV-1 infections, anxiety disorder.  Per chart review patient lives with spouse.  Independent prior to admission.  Two-level home.  Husband cannot physically provide assistance.  Presented 01/10/2018 with low-grade fever and altered mental status.  She recently seen her PCP who referred her to the ED for further evaluation but she did not follow-up.  Placed on Cipro for suspected upper respiratory infection.  CT/MRI of the brain negative for acute changes.  Urine culture no growth as well as blood cultures.  Noted hyponatremia 130 as well as hypokalemia 3.3.  EEG negative for seizure.  Echocardiogram with ejection fraction of 60% no wall motion abnormalities.  Placed on broad-spectrum antibiotics question meningitis.  LP was negative.  Currently remains on acyclovir for presumed viral meningitis.  Found to have new onset atrial fibrillation with RVR and cardiology services consulted with follow-up and await plan of care on possible need for anticoagulation however bouts of thrombocytopenia that improved after discontinuation of vancomycin and heparin and continue to monitor.Marland Kitchen  Physical therapy evaluation completed 01/13/2018.  MD has requested physical medicine rehab consult.   Review of Systems  Constitutional: Positive for fever.  HENT: Negative for hearing loss.   Eyes: Negative for blurred vision and double vision.  Respiratory: Positive for cough. Negative for shortness of breath.   Cardiovascular: Negative for chest pain and palpitations.  Gastrointestinal: Positive for constipation. Negative for nausea and vomiting.  Genitourinary: Negative for dysuria, flank pain and hematuria.  Musculoskeletal:  Positive for back pain and myalgias.  Skin: Negative for rash.  Neurological: Positive for dizziness.  Psychiatric/Behavioral: Positive for depression.       Anxiety  All other systems reviewed and are negative.  Past Medical History:  Diagnosis Date  . Anxiety   . Depression   . Family history of anesthesia complication    sister extreme nausea & vomiting  . 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 Osteoporosis   . Seasonal allergies    Past Surgical History:  Procedure Laterality Date  . ABDOMINAL HYSTERECTOMY  1984  . APPENDECTOMY  1984   at time of Hysterectomy  . HAMMER TOE SURGERY  1993   2nd toe left foot  . THYROID LOBECTOMY  09/15/2012   Procedure: THYROID LOBECTOMY;  Surgeon: Melissa Montane, MD;  Location: [facility];  Service: ENT;  Laterality: Left;  . THYROIDECTOMY, PARTIAL  09/15/2012   Dr Janace Hoard  . TONSILLECTOMY  1957   Family History  Problem Relation Age of Onset  . Stomach cancer Brother 7  . Colon cancer Paternal Grandmother 36  . Diabetes Father   . Hypertension Sister   . Heart attack Neg Hx    Social History:  reports that she has never smoked. She has never used smokeless tobacco. She reports that she does not drink alcohol or use drugs. Allergies:  Allergies  Allergen Reactions  . Procaine Other (See Comments)    REACTION:  Lowers blood pressure, "makes me loopy, can't move or talk"   Medications Prior to Admission  Medication Sig Dispense Refill  . acetaminophen (TYLENOL) 325 MG tablet Take 325-650 mg by mouth every 6 (six) hours as needed for headache (pain).    Marland Kitchen acetaminophen-codeine (TYLENOL #3) 300-30 MG tablet Take one tab  at bedtime as needed (Patient taking differently: Take 1 tablet by mouth at bedtime as needed (pain). ) 20 tablet 0  . ALPRAZolam (XANAX) 0.5 MG tablet TAKE 1/2 TO 1 (ONE-HALF TO ONE) TABLET BY MOUTH THREE TIMES DAILY AS NEEDED (Patient taking differently: TAKE 2 TABLETS BY MOUTH DAILY AT BEDTIME) 90 tablet 5  . Ascorbic Acid  (VITAMIN C PO) Take 1 tablet by mouth daily.    Marland Kitchen aspirin 325 MG tablet Take 162.5 mg by mouth daily.     Marland Kitchen b complex vitamins tablet Take 1 tablet by mouth daily.    . baclofen (LIORESAL) 10 MG tablet TAKE 1 TABLET BY MOUTH ONCE DAILY AT BEDTIME 30 tablet 6  . cyanocobalamin (,VITAMIN B-12,) 1000 MCG/ML injection Inject 1,000 mcg into the muscle every 30 (thirty) days.    Marland Kitchen ezetimibe (ZETIA) 10 MG tablet TAKE 1 TABLET BY MOUTH ONCE DAILY 30 tablet 5  . Multiple Vitamin (MULTIVITAMIN WITH MINERALS) TABS tablet Take 1 tablet by mouth daily. Centrum    . Omega-3 Fatty Acids (FISH OIL PO) Take 2 capsules by mouth daily.    Marland Kitchen acyclovir (ZOVIRAX) 400 MG tablet Take 1 tablet (400 mg total) by mouth 3 (three) times daily. As directed by physician 30 tablet 2    Home: Home Living Family/patient expects to be discharged to:: Private residence Living Arrangements: Spouse/significant other Available Help at Discharge: Family Type of Home: House Home Access: Stairs to enter Technical brewer of Steps: 10 Entrance Stairs-Rails: Can reach both Home Layout: Two level Alternate Level Stairs-Number of Steps: flight with a landing at the entrance hall Alternate Level Stairs-Rails: Can reach both Bathroom Shower/Tub: Tub/shower unit(takes tub baths) Bathroom Toilet: Standard Home Equipment: Cane - single point Additional Comments: not sure if has a walker at home  Functional History: Prior Function Level of Independence: Independent Comments: spouse can't help Functional Status:  Mobility: Bed Mobility Overal bed mobility: Needs Assistance Bed Mobility: Supine to Sit, Sit to Supine Supine to sit: Supervision, HOB elevated Sit to supine: Supervision General bed mobility comments: increased time, assist for safety Transfers Overall transfer level: Needs assistance Equipment used: Rolling walker (2 wheeled) Transfers: Sit to/from Stand Sit to Stand: Min assist General transfer comment:  cues for targeting and assist for balance Ambulation/Gait Ambulation/Gait assistance: Min assist Ambulation Distance (Feet): 140 Feet Assistive device: Rolling walker (2 wheeled) Gait Pattern/deviations: Step-through pattern, Decreased stride length, Shuffle General Gait Details: slow pace, cues for targeting throughout, assist for turns with walker and for balance    ADL:    Cognition: Cognition Overall Cognitive Status: Impaired/Different from baseline Orientation Level: Oriented X4 Cognition Arousal/Alertness: Awake/alert Behavior During Therapy: WFL for tasks assessed/performed Overall Cognitive Status: Impaired/Different from baseline Area of Impairment: Attention, Memory, Problem solving Current Attention Level: Sustained Memory: Decreased short-term memory Problem Solving: Slow processing  Blood pressure 122/74, pulse 85, temperature 98.7 F (37.1 C), temperature source Oral, resp. rate 18, height 5' 6.5" (1.689 m), weight 79.8 kg (175 lb 14.8 oz), SpO2 93 %. Physical Exam  Vitals reviewed. Constitutional: She appears well-developed.  HENT:  Head: Normocephalic.  Eyes: Pupils are equal, round, and reactive to light.  Neck: Normal range of motion.  Cardiovascular: Normal rate.  Respiratory: Effort normal.  GI: Soft.  Musculoskeletal: Normal range of motion.  Neurological: She is alert. No cranial nerve deficit.  Patient is alert.  Needed some encouragement times participate with exam.  Follows simple commands.  She was able to provide her name and age  as well as date of birth. Able to review biographical information. Decreased insight and awareness. Processing delays. Strength 4 to 4+/5 all 4's. Decreased standing balance, falling quickly in posterior direction.   Psychiatric:  Pleasant and cooperative    Results for orders placed or performed during the hospital encounter of 01/10/18 (from the past 24 hour(s))  CBC with Differential/Platelet     Status: Abnormal    Collection Time: 01/13/18  7:45 AM  Result Value Ref Range   WBC 11.4 (H) 4.0 - 10.5 K/uL   RBC 5.05 3.87 - 5.11 MIL/uL   Hemoglobin 13.7 12.0 - 15.0 g/dL   HCT 39.7 36.0 - 46.0 %   MCV 78.6 78.0 - 100.0 fL   MCH 27.1 26.0 - 34.0 pg   MCHC 34.5 30.0 - 36.0 g/dL   RDW 14.0 11.5 - 15.5 %   Platelets 114 (L) 150 - 400 K/uL   Neutrophils Relative % 62 %   Lymphocytes Relative 34 %   Monocytes Relative 4 %   Eosinophils Relative 0 %   Basophils Relative 0 %   Neutro Abs 7.0 1.7 - 7.7 K/uL   Lymphs Abs 3.9 0.7 - 4.0 K/uL   Monocytes Absolute 0.5 0.1 - 1.0 K/uL   Eosinophils Absolute 0.0 0.0 - 0.7 K/uL   Basophils Absolute 0.0 0.0 - 0.1 K/uL   WBC Morphology ATYPICAL LYMPHOCYTES   Basic metabolic panel     Status: Abnormal   Collection Time: 01/14/18  4:19 AM  Result Value Ref Range   Sodium 136 135 - 145 mmol/L   Potassium 3.3 (L) 3.5 - 5.1 mmol/L   Chloride 107 101 - 111 mmol/L   CO2 23 22 - 32 mmol/L   Glucose, Bld 126 (H) 65 - 99 mg/dL   BUN 8 6 - 20 mg/dL   Creatinine, Ser 0.60 0.44 - 1.00 mg/dL   Calcium 7.3 (L) 8.9 - 10.3 mg/dL   GFR calc non Af Amer >60 >60 mL/min   GFR calc Af Amer >60 >60 mL/min   Anion gap 6 5 - 15  CBC     Status: Abnormal   Collection Time: 01/14/18  4:19 AM  Result Value Ref Range   WBC 15.0 (H) 4.0 - 10.5 K/uL   RBC 4.81 3.87 - 5.11 MIL/uL   Hemoglobin 13.1 12.0 - 15.0 g/dL   HCT 38.4 36.0 - 46.0 %   MCV 79.8 78.0 - 100.0 fL   MCH 27.2 26.0 - 34.0 pg   MCHC 34.1 30.0 - 36.0 g/dL   RDW 14.2 11.5 - 15.5 %   Platelets 163 150 - 400 K/uL   Mr Albertina [place] Wo Contrast  Result Date: 01/13/2018 CLINICAL DATA:  Severe vertigo.  Acute onset nystagmus, nausea EXAM: MRI HEAD WITHOUT AND WITH CONTRAST TECHNIQUE: Multiplanar, multiecho pulse sequences of the brain and surrounding structures were obtained without and with intravenous contrast. CONTRAST:  43mL MULTIHANCE GADOBENATE DIMEGLUMINE 529 MG/ML IV SOLN COMPARISON:  MRI head 01/11/2018, CT  01/12/2018 FINDINGS: Brain: IAC protocol was performed including thin section imaging through the posterior fossa before and after intravenous contrast. Seventh and eighth cranial nerves normal. No enhancing mass lesion. Basilar cisterns normal. Mastoid sinus is clear bilaterally. Brainstem and cerebellum normal. No enhancing mass in the temporal bone or posterior fossa. Normal venous enhancement. Mild atrophy. Ventricle size normal. Negative for acute infarct. Mild chronic microvascular ischemic change in the white matter. Negative for hemorrhage or mass. Vascular: Normal arterial flow void Skull and  upper cervical spine: Negative Sinuses/Orbits: Mild mucosal edema paranasal sinuses.  Normal orbit Other: None IMPRESSION: No cause for vertigo or nystagmus identified. Negative for acute infarct or mass. Mild chronic microvascular ischemic change in the white matter. Electronically Signed   By: Franchot Gallo M.D.   On: 01/13/2018 07:20    Assessment/Plan: Diagnosis: viral meningitis/encephalitis with subsequent cognitive and balance deficits 1. Does the need for close, 24 hr/day medical supervision in concert with the patient's rehab needs make it unreasonable for this patient to be served in a less intensive setting? Yes 2. Co-Morbidities requiring supervision/potential complications: ID consderations, ongoing neurological sequelae 3. Due to bladder management, bowel management, safety, skin/wound care, disease management, medication administration, pain management and patient education, does the patient require 24 hr/day rehab nursing? Yes 4. Does the patient require coordinated care of a physician, rehab nurse, PT (1-2 hrs/day, 5 days/week), OT (1-2 hrs/day, 5 days/week) and SLP (1-2 hrs/day, 5 days/week) to address physical and functional deficits in the context of the above medical diagnosis(es)? Yes Addressing deficits in the following areas: balance, endurance, locomotion, strength, transferring,  bowel/bladder control, bathing, dressing, feeding, grooming, toileting, cognition and psychosocial support 5. Can the patient actively participate in an intensive therapy program of at least 3 hrs of therapy per day at least 5 days per week? Yes 6. The potential for patient to make measurable gains while on inpatient rehab is excellent 7. Anticipated functional outcomes upon discharge from inpatient rehab are modified independent  with PT, modified independent with OT, modified independent with SLP. 8. Estimated rehab length of stay to reach the above functional goals is: 7-10 days 9. Anticipated D/C setting: Home 10. Anticipated post D/C treatments: HH therapy and Outpatient therapy 11. Overall Rehab/Functional Prognosis: excellent  RECOMMENDATIONS: This patient's condition is appropriate for continued rehabilitative care in the following setting: CIR Patient has agreed to participate in recommended program. Yes Note that insurance prior authorization may be required for reimbursement for recommended care.  Comment: Rehab Admissions Coordinator to follow up.  Thanks,  Meredith Staggers, MD, Carly Rhodes  I have personally performed a face to face diagnostic evaluation of this patient. Additionally, I have reviewed and concur with the physician assistant's documentation above.      Lavon Paganini Angiulli, PA-C 01/14/2018

## 2018-01-14 NOTE — Progress Notes (Signed)
Inpatient Rehabilitation-Admissions Coordinator    Met with patient at the bedside to discuss team's recommendation for inpatient rehabilitation. Shared booklets, expectations while in CIR, expected length of stay, and anticipated functional level at DC. Pt is aware that she is in need of rehab prior to returning home and is in favor of CIR. AC has begun seeking insurance authorization for this patient. It is unlikely that [facility] will hear back from the insurance carrier today-anticipate answer Monday. Please call if questions.   Jhonnie Garner, OTR/L  Rehab Admissions Coordinator  [phone] 01/14/2018 3:30 PM

## 2018-01-14 NOTE — NC FL2 (Signed)
[place] LEVEL OF CARE SCREENING TOOL     IDENTIFICATION  Patient Name: Carly Rhodes Birthdate: 04/22/1946 Sex: female Admission Date (Current Location): 01/10/2018  [facility] and [place] Number:  Herbalist and Address:  [facility], [place] [address], [place], [place] [address]      Provider Number: [id number]  Attending Physician Name and Address:  Caren Griffins, MD  Relative Name and Phone Number:       Current Level of Care: Hospital Recommended Level of Care: [place] Prior Approval Number:    Date Approved/Denied:   PASRR Number: pending  Discharge Plan: SNF    Current Diagnoses: Patient Active Problem List   Diagnosis Date Noted  . Encephalopathy acute 01/10/2018  . Headache 01/10/2018  . UTI (urinary tract infection) 12/23/2017  . Right knee pain 10/19/2017  . Posterior tibial tendinitis of right leg 09/28/2017  . Insomnia 05/20/2017  . Incontinence of feces 05/11/2017  . Caregiver stress 05/11/2017  . Vaginal vault prolapse after hysterectomy 05/11/2017  . Vitamin B 12 deficiency 04/09/2017  . Fatigue 04/08/2017  . Acute upper respiratory infection 10/28/2016  . Pain of finger of right hand 10/08/2016  . Dyslipidemia 12/25/2015  . Osteoarthritis 12/25/2015  . Osteopenia 12/25/2015  . Cystocele with uterine prolapse 01/24/2015  . Effusion of left knee 05/10/2014  . Knee pain, left 11/21/2013  . Gallstones without obstruction of gallbladder 05/31/2013  . Tendinitis of right rotator cuff 02/16/2013  . Pain in joint, shoulder region 12/06/2012  . Wrist pain 04/26/2012  . Thumb pain 04/26/2012  . Herpes simplex virus (HSV) infection 07/05/2009  . NONTOXIC MULTINODULAR GOITER 07/05/2009  . LIPOMA OF OTHER SKIN AND SUBCUTANEOUS TISSUE 08/16/2007  . DYSTHYMIA 08/16/2007  . LOW BACK PAIN SYNDROME 08/16/2007    Orientation RESPIRATION BLADDER Height & Weight     Self, Time, Situation,  Place  Normal Continent Weight: 175 lb 14.8 oz (79.8 kg) Height:  5' 6.5" (168.9 cm)  BEHAVIORAL SYMPTOMS/MOOD NEUROLOGICAL BOWEL NUTRITION STATUS      Continent Diet(heart healthy, thin liquids)  AMBULATORY STATUS COMMUNICATION OF NEEDS Skin   Limited Assist Verbally Normal                       Personal Care Assistance Level of Assistance  Bathing, Feeding, Dressing Bathing Assistance: Limited assistance Feeding assistance: Independent Dressing Assistance: Limited assistance     Functional Limitations Info  Sight, Hearing, Speech Sight Info: Adequate Hearing Info: Adequate Speech Info: Adequate    SPECIAL CARE FACTORS FREQUENCY  PT (By licensed PT), OT (By licensed OT)     PT Frequency: 3x OT Frequency: 3x            Contractures Contractures Info: Not present    Additional Factors Info  Code Status, Allergies Code Status Info: Full Code Allergies Info: Procaine           Current Medications (01/14/2018):  This is the current hospital active medication list Current Facility-Administered Medications  Medication Dose Route Frequency Provider Last Rate Last Dose  . acetaminophen (TYLENOL) tablet 325-650 mg  325-650 mg Oral Q6H PRN Elwyn Reach, MD   650 mg at 01/12/18 0531  . amiodarone (PACERONE) tablet 200 mg  200 mg Oral Daily Marzetta Board M, MD   200 mg at 01/14/18 1412  . apixaban (ELIQUIS) tablet 5 mg  5 mg Oral BID Caren Griffins, MD   5 mg  at 01/14/18 1412  . B-complex with vitamin C tablet 1 tablet  1 tablet Oral Daily Elwyn Reach, MD   1 tablet at 01/14/18 1038  . baclofen (LIORESAL) tablet 10 mg  10 mg Oral QHS Elwyn Reach, MD   10 mg at 01/13/18 2133  . ezetimibe (ZETIA) tablet 10 mg  10 mg Oral Daily Gala Romney L, MD   10 mg at 01/14/18 1038  . FLUoxetine (PROZAC) capsule 40 mg  40 mg Oral Daily Gala Romney L, MD   40 mg at 01/13/18 1032  . loratadine (CLARITIN) tablet 10 mg  10 mg Oral Daily Gala Romney L, MD    10 mg at 01/13/18 1031  . ondansetron (ZOFRAN) tablet 4 mg  4 mg Oral Q6H PRN Elwyn Reach, MD   4 mg at 01/12/18 2106   Or  . ondansetron (ZOFRAN) injection 4 mg  4 mg Intravenous Q6H PRN Gala Romney L, MD      . predniSONE (DELTASONE) tablet 60 mg  60 mg Oral Q breakfast Caren Griffins, MD   60 mg at 01/14/18 1038  . traMADol (ULTRAM) tablet 25 mg  25 mg Oral Q6H PRN Caren Griffins, MD   25 mg at 01/14/18 1427     Discharge Medications: Please see discharge summary for a list of discharge medications.  Relevant Imaging Results:  Relevant Lab Results:   Additional Information SSN; [id number]  Eileen Stanford, LCSW

## 2018-01-14 NOTE — Progress Notes (Signed)
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 She was initially placed on broad-spectrum antibiotics to cover for meningitis however these were discontinued once the LP was negative. She has been maintained on acyclovir for presumed viral meningitis. She was also found to have new onset A. fib with RVR for which cardiology was consulted  Assessment & Plan: Principal Problem:   Encephalopathy acute Active Problems:   Herpes simplex virus (HSV) infection   Dyslipidemia   UTI (urinary tract infection)   Headache   Acute encephalopathy -Probably in the setting of fever/nonspecific viral infection, her encephalopathy has resolved she is alert and oriented x4 however still feels a little bit "off" -Antibiotics and antivirals have been now discontinued -HSV DNA was negative -Patient appears to have developed post viral labyrinthitis, discussed with Dr. Leonel Ramsay, start steroids.  She has completed 7 days of acyclovir, stop as it can also cause encephalopathy/delirium -She will need prednisone 60 mg x 5 days followed by a 5-day taper (50>>10)  Atrial fibrillation, new onset, concern for tachybradycardia syndrome -patient's CHA2DS2-VASc Score for Stroke Risk is> 2 -Cardiology consulted, appreciate input -Patient initially started on metoprolol however had a one 4-second and one 5-second pause on telemetry yesterday, metoprolol has since been discontinued -EP consulted -2D echo done on 5/28 showed normal systolic ejection fraction 55 to 60%, no WMA -Fortunately patient converted back to sinus rhythm, EP recommended amiodarone which will be started today. -Start Eliquis today as well for anticoagulation as her platelets have  now normalized  Hyperlipidemia -Continue home medications  Thrombocytopenia -discussed with Dr.Gorsuch, unlikely TTP, smear without schistocytes and appears normal  -Potential offending agent/vancomycin was discontinued on 5/28 -Platelets have now normalized, will start Eliquis as above    DVT prophylaxis: On Eliquis Code Status: Full code Family Communication: No family at bedside Disposition Plan: Inpatient rehab pending insurance authorization  Consultants:   Cardiology  Hematology  Neurology  Procedures:   2D echo Study Conclusions - Left ventricle: The cavity size was normal. Systolic function was normal. The estimated ejection fraction was in the range of 55% to 60%. Wall motion was normal; there were no regional wall motion abnormalities. - Aortic valve: Transvalvular velocity was within the normal range. There was no stenosis. There was no regurgitation. Valve area (VTI): 1.97 cm^2. Valve area (Vmax): 2.23 cm^2. Valve area (Vmean): 2.19 cm^2. - Mitral valve: Transvalvular velocity was within the normal range. There was no evidence for stenosis. There was mild regurgitation. Valve area by pressure half-time: 1.26 cm^2. - Left atrium: The atrium was severely dilated. - Right ventricle: The cavity size was normal. Wall thickness was normal. Systolic function was normal. - Tricuspid valve: There was trivial regurgitation. - Pulmonary arteries: Systolic pressure was within the normal   range. PA peak pressure: 21 mm Hg (S).   EEG IMPRESSION:  This is a normal EEG for the patients stated age.  There were no focal, hemispheric or lateralizing features.  No epileptiform activity was recorded.  A normal EEG does not exclude the diagnosis of a seizure disorder and if seizure remains high on the list of differential diagnosis, an ambulatory EEG may be of value.  Clinical correlation is required.  Antimicrobials:  Vancomycin/ampicillin/ceftriaxone 5/27 >>  5/28  Acyclovir  5/27 >> 5/30  Subjective: - no chest pain, shortness of breath, no abdominal pain, nausea or vomiting.  Still feels  a little bit loopy and slow.  Less dizzy today  Objective: Vitals:   01/13/18 2347 01/14/18 0426 01/14/18 0736 01/14/18 1256  BP: 125/86 122/74 (!) 148/92 134/84  Pulse: (!) 108 85 92 81  Resp: 18 18 17 18   Temp: 97.9 F (36.6 C) 98.7 F (37.1 C) 98.4 F (36.9 C) 98.3 F (36.8 C)  TempSrc: Oral Oral Oral Oral  SpO2: 96% 93% 97% 97%  Weight:      Height:        Intake/Output Summary (Last 24 hours) at 01/14/2018 1656 Last data filed at 01/14/2018 1402 Gross per 24 hour  Intake 330 ml  Output -  Net 330 ml   Filed Weights   01/10/18 1457 01/10/18 2029  Weight: 80.3 kg (177 lb) 79.8 kg (175 lb 14.8 oz)    Examination:  Constitutional: NAD Eyes:  lids and conjunctivae normal ENMT: Mucous membranes are moist.  Neck: normal, supple Respiratory: clear to auscultation bilaterally, no wheezing, no crackles. Cardiovascular: Regular rate and rhythm, no murmurs / rubs / gallops. No LE edema. 2+ pedal pulses.  Abdomen: no tenderness. Bowel sounds positive.  Skin: no rashes Neurologic: CN 2-12 grossly intact. Strength 5/5 in all 4.     Data Reviewed: I have independently reviewed following labs and imaging studies   CBC: Recent Labs  Lab 01/10/18 1514 01/11/18 0610 01/12/18 0636 01/13/18 0745 01/14/18 0419  WBC 2.3* 4.1 5.8 11.4* 15.0*  NEUTROABS 1.5*  --  3.9 7.0  --   HGB 14.0 14.6 13.2 13.7 13.1  HCT 40.7 43.2 38.6 39.7 38.4  MCV 80.0 80.6 80.6 78.6 79.8  PLT 69* 75* 62* 114* 767   Basic Metabolic Panel: Recent Labs  Lab 01/10/18 1514 01/11/18 0610 01/14/18 0419  NA 130* 141 136  K 3.3* 3.5 3.3*  CL 98* 110 107  CO2 21* 21* 23  GLUCOSE 95 143* 126*  BUN 11 9 8   CREATININE 0.82 0.64 0.60  CALCIUM 8.1* 7.9* 7.3*   GFR: Estimated Creatinine Clearance: 67.4 mL/min (by C-G formula based on SCr of 0.6 mg/dL). Liver  Function Tests: Recent Labs  Lab 01/10/18 1514 01/11/18 0610  AST 81* 73*  ALT 50 48  ALKPHOS 59 56  BILITOT 1.7* 1.1  PROT 5.7* 5.5*  ALBUMIN 3.1* 2.8*   No results for input(s): LIPASE, AMYLASE in the last 168 hours. No results for input(s): AMMONIA in the last 168 hours. Coagulation Profile: Recent Labs  Lab 01/10/18 1514  INR 1.06   Cardiac Enzymes: No results for input(s): CKTOTAL, CKMB, CKMBINDEX, TROPONINI in the last 168 hours. BNP (last 3 results) No results for input(s): PROBNP in the last 8760 hours. HbA1C: No results for input(s): HGBA1C in the last 72 hours. CBG: Recent Labs  Lab 01/10/18 1511  GLUCAP 81   Lipid Profile: No results for input(s): CHOL, HDL, LDLCALC, TRIG, CHOLHDL, LDLDIRECT in the last 72 hours. Thyroid Function Tests: No results for input(s): TSH, T4TOTAL, FREET4, T3FREE, THYROIDAB in the last 72 hours. Anemia Panel: No results for input(s): VITAMINB12, FOLATE, FERRITIN, TIBC, IRON, RETICCTPCT in the last 72 hours. Urine analysis:    Component Value Date/Time   COLORURINE YELLOW 01/10/2018 1721   APPEARANCEUR HAZY (A) 01/10/2018 1721   LABSPEC 1.008 01/10/2018 1721   PHURINE 5.0 01/10/2018 1721   GLUCOSEU NEGATIVE 01/10/2018 1721   GLUCOSEU NEGATIVE 12/23/2017 1242  HGBUR MODERATE (A) 01/10/2018 1721   BILIRUBINUR NEGATIVE 01/10/2018 1721   KETONESUR 20 (A) 01/10/2018 1721   PROTEINUR NEGATIVE 01/10/2018 1721   UROBILINOGEN 0.2 12/23/2017 1242   NITRITE NEGATIVE 01/10/2018 1721   LEUKOCYTESUR NEGATIVE 01/10/2018 1721   Sepsis Labs: Invalid input(s): PROCALCITONIN, LACTICIDVEN  Recent Results (from the past 240 hour(s))  Urine culture     Status: None   Collection Time: 01/10/18  3:04 PM  Result Value Ref Range Status   Specimen Description URINE, RANDOM  Final   Special Requests NONE  Final   Culture   Final    NO GROWTH Performed at [facility], [address] [place], [place] [address]    Report Status  01/11/2018 FINAL  Final  Blood Culture (routine x 2)     Status: None (Preliminary result)   Collection Time: 01/10/18  3:10 PM  Result Value Ref Range Status   Specimen Description BLOOD LEFT HAND  Final   Special Requests   Final    BOTTLES DRAWN AEROBIC AND ANAEROBIC Blood Culture adequate volume   Culture   Final    NO GROWTH 4 DAYS Performed at [facility], [place] [address] [place], [place] [address]    Report Status PENDING  Incomplete  Blood Culture (routine x 2)     Status: None (Preliminary result)   Collection Time: 01/10/18  3:16 PM  Result Value Ref Range Status   Specimen Description BLOOD RIGHT ANTECUBITAL  Final   Special Requests   Final    BOTTLES DRAWN AEROBIC AND ANAEROBIC Blood Culture adequate volume   Culture   Final    NO GROWTH 4 DAYS Performed at [facility], [place] [address], [place], [place] [address]    Report Status PENDING  Incomplete  CSF culture     Status: None   Collection Time: 01/10/18  5:41 PM  Result Value Ref Range Status   Specimen Description CSF  Final   Special Requests NONE  Final   Gram Stain   Final    CYTOSPIN SMEAR WBC PRESENT, PREDOMINANTLY MONONUCLEAR NO ORGANISMS SEEN    Culture   Final    NO GROWTH 3 DAYS Performed at [facility], [place] [address] [place], [place] [address]    Report Status 01/14/2018 FINAL  Final      Radiology Studies: Mr Albertina Sen[place] EU Contrast  Result Date: 01/13/2018 CLINICAL DATA:  Severe vertigo.  Acute onset nystagmus, nausea EXAM: MRI HEAD WITHOUT AND WITH CONTRAST TECHNIQUE: Multiplanar, multiecho pulse sequences of the brain and surrounding structures were obtained without and with intravenous contrast. CONTRAST:  25mL MULTIHANCE GADOBENATE DIMEGLUMINE 529 MG/ML IV SOLN COMPARISON:  MRI head 01/11/2018, CT 01/12/2018 FINDINGS: Brain: IAC protocol was performed including thin section imaging through the posterior fossa before and after intravenous contrast. Seventh and eighth  cranial nerves normal. No enhancing mass lesion. Basilar cisterns normal. Mastoid sinus is clear bilaterally. Brainstem and cerebellum normal. No enhancing mass in the temporal bone or posterior fossa. Normal venous enhancement. Mild atrophy. Ventricle size normal. Negative for acute infarct. Mild chronic microvascular ischemic change in the white matter. Negative for hemorrhage or mass. Vascular: Normal arterial flow void Skull and upper cervical spine: Negative Sinuses/Orbits: Mild mucosal edema paranasal sinuses.  Normal orbit Other: None IMPRESSION: No cause for vertigo or nystagmus identified. Negative for acute infarct or mass. Mild chronic microvascular ischemic change in the white matter. Electronically Signed   By: Juanda Crumble  Carlis Abbott M.D.   On: 01/13/2018 07:20     Scheduled Meds: . amiodarone  200 mg Oral Daily  . apixaban  5 mg Oral BID  . B-complex with vitamin C  1 tablet Oral Daily  . baclofen  10 mg Oral QHS  . ezetimibe  10 mg Oral Daily  . FLUoxetine  40 mg Oral Daily  . loratadine  10 mg Oral Daily  . predniSONE  60 mg Oral Q breakfast   Continuous Infusions:   Marzetta Board, MD, PhD [facility] Pager [phone]  If 7PM-7AM, please contact night-coverage [email or web address] Password [facility] 01/14/2018, 4:56 PM

## 2018-01-14 NOTE — Discharge Instructions (Addendum)
Follow with Noralee Space, MD in 2 weeks  Please get a complete blood count and chemistry panel checked by your Primary MD at your next visit, and again as instructed by your Primary MD. Please get your medications reviewed and adjusted by your Primary MD.  Please request your Primary MD to go over all Hospital Tests and Procedure/Radiological results at the follow up, please get all Hospital records sent to your Prim MD by signing hospital release before you go home.  If you had Pneumonia of Lung problems at the Hospital: Please get a 2 view Chest X ray done in 6-8 weeks after hospital discharge or sooner if instructed by your Primary MD.  If you have Congestive Heart Failure: Please call your Cardiologist or Primary MD anytime you have any of the following symptoms:  1) 3 pound weight gain in 24 hours or 5 pounds in 1 week  2) shortness of breath, with or without a dry hacking cough  3) swelling in the hands, feet or stomach  4) if you have to sleep on extra pillows at night in order to breathe  Follow cardiac low salt diet and 1.5 lit/day fluid restriction.  If you have diabetes Accuchecks 4 times/day, Once in AM empty stomach and then before each meal. Log in all results and show them to your primary doctor at your next visit. If any glucose reading is under 80 or above 300 call your primary MD immediately.  If you have Seizure/Convulsions/Epilepsy: Please do not drive, operate heavy machinery, participate in activities at heights or participate in high speed sports until you have seen by Primary MD or a Neurologist and advised to do so again.  If you had Gastrointestinal Bleeding: Please ask your Primary MD to check a complete blood count within one week of discharge or at your next visit. Your endoscopic/colonoscopic biopsies that are pending at the time of discharge, will also need to followed by your Primary MD.  Get Medicines reviewed and adjusted. Please take all your  medications with you for your next visit with your Primary MD  Please request your Primary MD to go over all hospital tests and procedure/radiological results at the follow up, please ask your Primary MD to get all Hospital records sent to his/her office.  If you experience worsening of your admission symptoms, develop shortness of breath, life threatening emergency, suicidal or homicidal thoughts you must seek medical attention immediately by calling 911 or calling your MD immediately  if symptoms less severe.  You must read complete instructions/literature along with all the possible adverse reactions/side effects for all the Medicines you take and that have been prescribed to you. Take any new Medicines after you have completely understood and accpet all the possible adverse reactions/side effects.   Do not drive or operate heavy machinery when taking Pain medications.   Do not take more than prescribed Pain, Sleep and Anxiety Medications  Special Instructions: If you have smoked or chewed Tobacco  in the last 2 yrs please stop smoking, stop any regular Alcohol  and or any Recreational drug use.  Wear Seat belts while driving.  Please note You were cared for by a hospitalist during your hospital stay. If you have any questions about your discharge medications or the care you received while you were in the hospital after you are discharged, you can call the unit and asked to speak with the hospitalist on call if the hospitalist that took care of you is not available.  Once you are discharged, your primary care physician will handle any further medical issues. Please note that NO REFILLS for any discharge medications will be authorized once you are discharged, as it is imperative that you return to your primary care physician (or establish a relationship with a primary care physician if you do not have one) for your aftercare needs so that they can reassess your need for medications and monitor your  lab values.  You can reach the hospitalist office at phone [phone] or fax [phone]   If you do not have a primary care physician, you can call [phone] for a physician referral.  Activity: As tolerated with Full fall precautions use walker/cane & assistance as needed  Diet: regular  Disposition Home      Information on my medicine - ELIQUIS (apixaban)  This medication education was reviewed with me or my healthcare representative as part of my discharge preparation.   Why was Eliquis prescribed for you? Eliquis was prescribed for you to reduce the risk of a blood clot forming that can cause a stroke if you have a medical condition called atrial fibrillation (a type of irregular heartbeat).  What do You need to know about Eliquis ? Take your Eliquis TWICE DAILY - one tablet in the morning and one tablet in the evening with or without food. If you have difficulty swallowing the tablet whole please discuss with your pharmacist how to take the medication safely.  Take Eliquis exactly as prescribed by your doctor and DO NOT stop taking Eliquis without talking to the doctor who prescribed the medication.  Stopping may increase your risk of developing a stroke.  Refill your prescription before you run out.  After discharge, you should have regular check-up appointments with your healthcare provider that is prescribing your Eliquis.  In the future your dose may need to be changed if your kidney function or weight changes by a significant amount or as you get older.  What do you do if you miss a dose? If you miss a dose, take it as soon as you remember on the same day and resume taking twice daily.  Do not take more than one dose of ELIQUIS at the same time to make up a missed dose.  Important Safety Information A possible side effect of Eliquis is bleeding. You should call your healthcare provider right away if you experience any of the following: ? Bleeding from an injury  or your nose that does not stop. ? Unusual colored urine (red or dark brown) or unusual colored stools (red or black). ? Unusual bruising for unknown reasons. ? A serious fall or if you hit your head (even if there is no bleeding).  Some medicines may interact with Eliquis and might increase your risk of bleeding or clotting while on Eliquis. To help avoid this, consult your healthcare provider or pharmacist prior to using any new prescription or non-prescription medications, including herbals, vitamins, non-steroidal anti-inflammatory drugs (NSAIDs) and supplements.  This website has more information on Eliquis (apixaban): http://[email or web address].com/eliquis/home

## 2018-01-14 NOTE — Progress Notes (Addendum)
Progress Note  Patient Name: Carly Rhodes Date of Encounter: 01/14/2018  Primary Cardiologist: Dr. Marlou Porch  Subjective   Feels worried that she is getting "bad" news,  anxious, no c/o CP, palpitations or SOB  Inpatient Medications    Scheduled Meds: . B-complex with vitamin C  1 tablet Oral Daily  . baclofen  10 mg Oral QHS  . ezetimibe  10 mg Oral Daily  . FLUoxetine  40 mg Oral Daily  . loratadine  10 mg Oral Daily  . predniSONE  60 mg Oral Q breakfast   Continuous Infusions:  PRN Meds: acetaminophen, ondansetron **OR** ondansetron (ZOFRAN) IV, traMADol   Vital Signs    Vitals:   01/13/18 2007 01/13/18 2347 01/14/18 0426 01/14/18 0736  BP: 132/86 125/86 122/74 (!) 148/92  Pulse: 100 (!) 108 85 92  Resp: 18 18 18 17   Temp: 97.8 F (36.6 C) 97.9 F (36.6 C) 98.7 F (37.1 C) 98.4 F (36.9 C)  TempSrc: Oral Oral Oral Oral  SpO2: 95% 96% 93% 97%  Weight:      Height:        Intake/Output Summary (Last 24 hours) at 01/14/2018 1007 Last data filed at 01/13/2018 1858 Gross per 24 hour  Intake 480 ml  Output -  Net 480 ml   Filed Weights   01/10/18 1457 01/10/18 2029  Weight: 177 lb (80.3 kg) 175 lb 14.8 oz (79.8 kg)    Telemetry    AFIB >> SR 70's - Personally Reviewed  ECG    No new EKGs  - Personally Reviewed  Physical Exam   GEN: anxious, though in No acute physical distress.   Neck: No JVD Cardiac: RRR, no murmurs, rubs, or gallops.  Respiratory: CTA b/l. GI: Soft, nontender, non-distended  MS: No edema; No deformity. Neuro:  Nonfocal  Psych: Normal affect   Labs    Chemistry Recent Labs  Lab 01/10/18 1514 01/11/18 0610 01/14/18 0419  NA 130* 141 136  K 3.3* 3.5 3.3*  CL 98* 110 107  CO2 21* 21* 23  GLUCOSE 95 143* 126*  BUN 11 9 8   CREATININE 0.82 0.64 0.60  CALCIUM 8.1* 7.9* 7.3*  PROT 5.7* 5.5*  --   ALBUMIN 3.1* 2.8*  --   AST 81* 73*  --   ALT 50 48  --   ALKPHOS 59 56  --   BILITOT 1.7* 1.1  --   GFRNONAA >60  >60 >60  GFRAA >60 >60 >60  ANIONGAP 11 10 6      Hematology Recent Labs  Lab 01/12/18 0636 01/13/18 0745 01/14/18 0419  WBC 5.8 11.4* 15.0*  RBC 4.79 5.05 4.81  HGB 13.2 13.7 13.1  HCT 38.6 39.7 38.4  MCV 80.6 78.6 79.8  MCH 27.6 27.1 27.2  MCHC 34.2 34.5 34.1  RDW 14.0 14.0 14.2  PLT 62* 114* 163    Cardiac EnzymesNo results for input(s): TROPONINI in the last 168 hours. No results for input(s): TROPIPOC in the last 168 hours.   BNPNo results for input(s): BNP, PROBNP in the last 168 hours.   DDimer No results for input(s): DDIMER in the last 168 hours.   Radiology    Mr Albertina [place] YO Contrast Result Date: 01/13/2018 CLINICAL DATA:  Severe vertigo.  Acute onset nystagmus, nausea EXAM: MRI HEAD WITHOUT AND WITH CONTRAST TECHNIQUE: Multiplanar, multiecho pulse sequences of the brain and surrounding structures were obtained without and with intravenous contrast. CONTRAST:  3mL MULTIHANCE GADOBENATE DIMEGLUMINE 529 MG/ML IV  SOLN COMPARISON:  MRI head 01/11/2018, CT 01/12/2018 FINDINGS: Brain: IAC protocol was performed including thin section imaging through the posterior fossa before and after intravenous contrast. Seventh and eighth cranial nerves normal. No enhancing mass lesion. Basilar cisterns normal. Mastoid sinus is clear bilaterally. Brainstem and cerebellum normal. No enhancing mass in the temporal bone or posterior fossa. Normal venous enhancement. Mild atrophy. Ventricle size normal. Negative for acute infarct. Mild chronic microvascular ischemic change in the white matter. Negative for hemorrhage or mass. Vascular: Normal arterial flow void Skull and upper cervical spine: Negative Sinuses/Orbits: Mild mucosal edema paranasal sinuses.  Normal orbit Other: None IMPRESSION: No cause for vertigo or nystagmus identified. Negative for acute infarct or mass. Mild chronic microvascular ischemic change in the white matter. Electronically Signed   By: Franchot Gallo M.D.   On:  01/13/2018 07:20    Cardiac Studies   Echocardiogram 01/11/18: Study Conclusions - Left ventricle: The cavity size was normal. Systolic function was normal. The estimated ejection fraction was in the range of 55% to 60%. Wall motion was normal; there were no regional wall motion abnormalities. - Aortic valve: Transvalvular velocity was within the normal range. There was no stenosis. There was no regurgitation. Valve area (VTI): 1.97 cm^2. Valve area (Vmax): 2.23 cm^2. Valve area (Vmean): 2.19 cm^2. - Mitral valve: Transvalvular velocity was within the normal range. There was no evidence for stenosis. There was mild regurgitation. Valve area by pressure half-time: 1.26 cm^2. - Left atrium: The atrium was severely dilated. - Right ventricle: The cavity size was normal. Wall thickness was normal. Systolic function was normal. - Tricuspid valve: There was trivial regurgitation. - Pulmonary arteries: Systolic pressure was within the normal range. PA peak pressure: 21 mm Hg (S).    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 AFib, new for patient of unknown duration     Initially RVR, strted on BB, she apparently was observed to have 4.5 sec pause associated with vomiting episode without syncope yesterday, though BB was reduced     She had another pause yesterday afternoon, though not apparently associated with any symptoms/activities and BB was stopped 2. Bradycardia, pauses. These have been episodic, at least once can be associated with retching/vomiting, though not every time  The patient was seen and examined this morning by Dr. Lovena Le.  She has since returned to SR, in sinus she has not had any bradycardic events and may not need pacing. Unclear if AF was provoked by acute febrile illness.  From an EP  perspective.  RECOMMEND starting amiodarone 200mg  daily, and anticoagulation with Eliquis she would qualify for full dose 5mg  BID (looks like cleared for a/c by heme).  We will defer start of meds to primary medicine and cardiology team    2. Encephalopathy     Per IM: Probably in the setting of fever/nonspecific viral infection     uine w/E. Coli     Neuro notes: most likely explanation is viral syndrome with delirium with post-viral labyrinthitis, pending start of steroid regime.      better   3. Thrombocytopenia      Per IM note, discussed with Dr.Gorsuch, unlikely TTP, smear without schistocytes and appears normal  -Potential offending agent/vancomycin was discontinued on 5/28       plts are improving  For questions or updates, please contact [place] Please consult [email or web address] for contact info under Cardiology/STEMI.      Signed, Baldwin [place], PA-C  01/14/2018, 10:07 AM    EP attending  Patient seen and examined.  Agree with the findings as noted above.  The patient has reverted to sinus rhythm.  I would suggest initiation of amiodarone therapy.  She is at increased risk for thrombolic embolic event secondary to her atrial fibrillation.  If she is thought to be a candidate despite her thrombocytopenia, I would recommend initiation of Eliquis.  We will see her back in the office in several weeks.  For now I would recommend amiodarone 200 mg twice a day with plans to down titrate the amiodarone to once a day in approximately 4 weeks.  The patient is still at risk for bradycardia and will need to be followed closely.  Hopefully if she can maintain sinus rhythm, she will not have any more symptomatic bradycardia.  Cristopher Peru, MD

## 2018-01-14 NOTE — Progress Notes (Signed)
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  Being treated for viral meningitis and a-fib w/RVR.    PT Comments    Patient progressing with ambulation tolerance and able to initiate stair training this session.  She remains unsafe for d/c home without 24/7 assist, but she is concerned about being away from her husband too long.  Feel she is appropriate for CIR level rehab prior to d/c home to achieve mod I and to improve cognitive awareness, safety and for IADL performance.  PT to follow.  Follow Up Recommendations  Supervision/Assistance - 24 hour;CIR     Equipment Recommendations  Rolling walker with 5" wheels    Recommendations for Other Services       Precautions / Restrictions Precautions Precautions: Fall    Mobility  Bed Mobility         Supine to sit: Modified independent (Device/Increase time)        Transfers Overall transfer level: Needs assistance Equipment used: Rolling walker (2 wheeled) Transfers: Sit to/from Stand Sit to Stand: Min assist         General transfer comment: cues for hand placement  Ambulation/Gait Ambulation/Gait assistance: Min assist;Min guard Ambulation Distance (Feet): 200 Feet Assistive device: Rolling walker (2 wheeled) Gait Pattern/deviations: Step-through pattern;Decreased stride length;Drifts right/left;Shuffle     General Gait Details: increased time for ambulation, not needing cues for targeting due to reports vertigo and nausea much better, disoriented to location and needed mod to max questioning cues to find her room, looking R and L for room number with slow pace, stopping to look, no c/o dizziness   Stairs Stairs: Yes Stairs assistance:  Min assist Stair Management: Step to pattern;Alternating pattern;Forwards Number of Stairs: 2(x 2) General stair comments: slow pace, confused as to how she normally does it, frustrated with herself she can't be like normal   Wheelchair Mobility    Modified Rankin (Stroke Patients Only)       Balance Overall balance assessment: Needs assistance   Sitting balance-Leahy Scale: Good     Standing balance support: No upper extremity supported;During functional activity Standing balance-Leahy Scale: Poor Standing balance comment: minguard with sit to stand and bracing legs against bed when unsupported                            Cognition Arousal/Alertness: Awake/alert Behavior During Therapy: WFL for tasks assessed/performed Overall Cognitive Status: Impaired/Different from baseline Area of Impairment: Attention;Memory;Problem solving                   Current Attention Level: Selective Memory: Decreased short-term memory       Problem Solving: Slow processing General Comments: able to attend to MD in room during PT session, walking and looking for her room with mod to max questioning cues      Exercises Other Exercises Other Exercises: Performed x 1 viewing exercies for vestibular adaptation seated unsupported with feet on floor and target on wall about 7' away; much difficulty with keeping eyes on target more due to distractions than vestibular issues.  Closer target for vertical x 1 to aid in target maintenance, but still needs cues for target maintenance and focus.  Discussed performing in therapy, but  not on her own.     General Comments General comments (skin integrity, edema, etc.): very different affect today, more concerned about not getting home right away to see her husband and help him.  Yesterday more concerned about safety and her ability to negotiate her home on two levels.       Pertinent Vitals/Pain Pain Assessment: No/denies pain     Home Living                      Prior Function            PT Goals (current goals can now be found in the care plan section) Progress towards PT goals: Progressing toward goals    Frequency    Min 3X/week      PT Plan Discharge plan needs to be updated    Co-evaluation              AM-PAC PT "6 Clicks" Daily Activity  Outcome Measure  Difficulty turning over in bed (including adjusting bedclothes, sheets and blankets)?: A Little Difficulty moving from lying on back to sitting on the side of the bed? : A Little Difficulty sitting down on and standing up from a chair with arms (e.g., wheelchair, bedside commode, etc,.)?: Unable Help needed moving to and from a bed to chair (including a wheelchair)?: A Little Help needed walking in hospital room?: A Little Help needed climbing 3-5 steps with a railing? : A Little 6 Click Score: 16    End of Session Equipment Utilized During Treatment: Gait belt Activity Tolerance: Patient tolerated treatment well Patient left: with call bell/phone within reach;in chair;with chair alarm set         Time: 9326-7124 PT Time Calculation (min) (ACUTE ONLY): 46 min  Charges:  $Gait Training: 8-22 mins $Therapeutic Activity: 8-22 mins $Neuromuscular Re-education: 8-22 mins                    G CodesMagda Kiel, [place] 01/14/2018    Reginia Naas 01/14/2018, 11:56 AM

## 2018-01-15 LAB — CULTURE, BLOOD (ROUTINE X 2)
Culture: NO GROWTH
Culture: NO GROWTH
Special Requests: ADEQUATE
Special Requests: ADEQUATE

## 2018-01-15 MED ORDER — APIXABAN 5 MG PO TABS: 5.0000 mg | ORAL_TABLET | Freq: Two times a day (BID) | ORAL | 1 refills | Status: DC

## 2018-01-15 MED ORDER — AMIODARONE HCL 200 MG PO TABS: 200.0000 mg | ORAL_TABLET | Freq: Every day | ORAL | 1 refills | Status: DC

## 2018-01-15 MED ORDER — PREDNISONE 10 MG PO TABS: 60.0000 mg | ORAL_TABLET | Freq: Every day | ORAL | 0 refills | Status: DC

## 2018-01-15 NOTE — Care Management Note (Signed)
Case Management Note  Patient Details  Name: JULIENE KIRSH MRN: [id number] Date of Birth: [birth year]  Subjective/Objective:        Pt presented for encephalopathy.  Pt d/c home with husband.  Pt states she has walker and does not need 3n1.          Action/Plan: Pt given list of [place] agencies and chooses [facility].  Jermaine with [facility] accepted referral.  Eliquis 30 day free card given.    Expected Discharge Date:  01/15/18               Expected Discharge Plan:  [place]  In-House Referral:  NA  Discharge planning Services  CM Consult  Post Acute Care Choice:  NA Choice offered to:  Patient  DME Arranged:  N/A DME Agency:  NA  HH Arranged:  PT [place] Agency:  [place]  Status of Service:  Completed, signed off  If discussed at [place] of Stay Meetings, dates discussed:    Additional Comments:  Claudie Leach, RN 01/15/2018, 9:49 AM

## 2018-01-15 NOTE — Progress Notes (Addendum)
Patient ready for discharge to home; discharge instructions given and reviewed; Rx's given; patient discharged out via wheelchair accompanied home by a friend. Spoke with Spouse; he is  at home awaiting her arrival home.

## 2018-01-15 NOTE — Discharge Summary (Signed)
Physician Discharge Summary  Tiney Rouge WUX:[id number] DOB: June 18, 1946 DOA: 01/10/2018  PCP: Noralee Space, MD  Admit date: 01/10/2018 Discharge date: 01/15/2018  Admitted From: home Disposition:  Home. Patient refused inpatient rehab.  Recommendations for Outpatient Follow-up:  1. Follow up with PCP in 1-2 weeks 2. Follow-up with cardiology as an outpatient in A. fib clinic  Home Health: PT Equipment/Devices: Walker  Discharge Condition: Stable CODE STATUS: Full code Diet recommendation: Heart healthy  HPI: Per Dr. Jonelle Sidle, [name] is a 72 y.o. female with medical history significant of recurrent HSV-1 infections, anxiety disorder, chronic pain syndrome on multiple narcotics and benzodiazepines who presented with headache and fever. Patient was seen and evaluated in the ER. She was initially seen by PCP 5 days ago. At that time she was told to come to the ER for evaluation but did not come in. She was given ciprofloxacin at the time for upper respiratory tract infection. In the ER today patient was confused with fever and headache. Suspected meningitis. Lumbar puncture was performed and patient was given Rocephin as well as acyclovir. She is still confused but arousable. Patient has recent UTI also that was treated. No history of dementia. She is now being admitted for workup and treatment. ED Course: Temperature is 102.4, pulse rate 147, respiratory of 31, blood pressure 93/65, Oxistat 90% on room air. She has a white count of 2.3 with neutropenia, platelets 69, sodium 1:30, potassium 3.3, chloride 98. Patient's creatinine is 0.82. Lumbar puncture performed results are currently pending.    Hospital Course: Acute encephalopathy -patient was admitted to the hospital with acute encephalopathy.  Given febrile illness in the emergency room, she was empirically started on antibiotics and antivirals out of concern for meningitis.  Neurology was consulted at that time.  She  underwent an LP which was unremarkable, and HSV DNA PCR was negative, and her antibiotics and antivirals were discontinued.  Of note, she she was on acyclovir prior to admission due to a cold sore and she had completed a total of 7-day course while hospitalized, and acyclovir may also have contributed to her encephalopathy along with her febrile illness.  Patient's mental status has improved and she has returned to baseline.  Her hospital course was complicated by post viral labyrinthitis when patient developed sudden onset of nystagmus and dizziness on hospital day 3.  She was placed on steroids at neurology recommendation and will have a taper as an outpatient.  Physical therapy recommended inpatient rehab, however patient did not want to wait and declined placement, wanted to go home.  Home health PT was arranged prior to discharge. Atrial fibrillation, new onset,concern for tachybradycardia syndrome -on admission patient was found to be in A. fib with RVR, no prior history of A. Fib. Her CHA2DS2-VASc Score for Stroke Risk is> 2.  Cardiology was consulted, appreciate input.  She was initially started on metoprolol however she has had a one 4 seconds and one 5-second pause on telemetry.  EP was consulted and considerations were given to a pacemaker, but eventually she converted back to sinus rhythm.  She was started on amiodarone and Eliquis, and she maintained sinus rhythm on telemetry up until discharge.  She underwent a 2D echo which showed normal systolic ejection fraction 55 to 60% without any wall motion abnormalities. Hyperlipidemia -Continue home medications Thrombocytopenia -on admission patient was found to be profoundly from cytopenic.  Given altered mentation TTP was in the differential and hematology was consulted. Her thrombocytopenia was  thought to be multifactorial in the setting of Vancomycin / heparin / febrile illness.  Vancomycin and heparin were discontinued.  Peripheral smear was without  schistocytes.  Her platelets have started to improve and have now been normalized.  Discharge Diagnoses:  Principal Problem:   Encephalopathy acute Active Problems:   Herpes simplex virus (HSV) infection   Dyslipidemia   UTI (urinary tract infection)   Headache   Discharge Instructions  Allergies as of 01/15/2018      Reactions   Procaine Other (See Comments)   REACTION:  Lowers blood pressure, "makes me loopy, can't move or talk"      Medication List    STOP taking these medications   acyclovir 400 MG tablet Commonly known as:  ZOVIRAX   aspirin 325 MG tablet     TAKE these medications   acetaminophen 325 MG tablet Commonly known as:  TYLENOL Take 325-650 mg by mouth every 6 (six) hours as needed for headache (pain).   acetaminophen-codeine 300-30 MG tablet Commonly known as:  TYLENOL #3 Take one tab at bedtime as needed What changed:    how much to take  how to take this  when to take this  reasons to take this  additional instructions   ALPRAZolam 0.5 MG tablet Commonly known as:  XANAX TAKE 1/2 TO 1 (ONE-HALF TO ONE) TABLET BY MOUTH THREE TIMES DAILY AS NEEDED What changed:  See the new instructions.   amiodarone 200 MG tablet Commonly known as:  PACERONE Take 1 tablet (200 mg total) by mouth daily.   apixaban 5 MG Tabs tablet Commonly known as:  ELIQUIS Take 1 tablet (5 mg total) by mouth 2 (two) times daily.   b complex vitamins tablet Take 1 tablet by mouth daily.   baclofen 10 MG tablet Commonly known as:  LIORESAL TAKE 1 TABLET BY MOUTH ONCE DAILY AT BEDTIME   cyanocobalamin 1000 MCG/ML injection Commonly known as:  (VITAMIN B-12) Inject 1,000 mcg into the muscle every 30 (thirty) days.   ezetimibe 10 MG tablet Commonly known as:  ZETIA TAKE 1 TABLET BY MOUTH ONCE DAILY   FISH OIL PO Take 2 capsules by mouth daily.   multivitamin with minerals Tabs tablet Take 1 tablet by mouth daily. Centrum   predniSONE 10 MG tablet Commonly  known as:  DELTASONE Take 6 tablets (60 mg total) by mouth daily. 6 tablets daily for 3 days then on the following days take 5, 4, 3, 2, 1 tablets then stop.   VITAMIN C PO Take 1 tablet by mouth daily.      Follow-up Information    Dr. Thompson Grayer Follow up on 01/31/2018.   Why:  Please arrive 15 minutes early for your 3:00pm Cardiology appointment for follow-up of your atrial fibrillation Contact information: [place] [address] [phone]       Noralee Space, MD. Schedule an appointment as soon as possible for a visit in 2 week(s).   Specialty:  Pulmonary Disease Contact information: [place] [address] [phone]        Jerline Pain, MD .   Specialty:  Cardiology Contact information: [address] [address] [phone]        [facility] Follow up.   Specialty:  Home Health Services Why:  Physical therapist willcall you with appointment. Contact information: [address] [place] [address] [phone]  Consultations:  Cardiology  Hematology  Neurology  Procedures/Studies:  2D echo  Study Conclusions - Left ventricle: The cavity size was normal. Systolic function was normal. The estimated ejection fraction was in the range of 55% to 60%. Wall motion was normal; there were no regional wall motion abnormalities. - Aortic valve: Transvalvular velocity was within the normal range. There was no stenosis. There was no regurgitation. Valve area (VTI): 1.97 cm^2. Valve area (Vmax): 2.23 cm^2. Valve area (Vmean): 2.19 cm^2. - Mitral valve: Transvalvular velocity was within the normal range. There was no evidence for stenosis. There was mild regurgitation. Valve area by pressure half-time: 1.26 cm^2. - Left atrium: The atrium was severely dilated. - Right ventricle: The cavity size was normal. Wall thickness was normal. Systolic function was normal. -  Tricuspid valve: There was trivial regurgitation. - Pulmonary arteries: Systolic pressure was within the normal range. PA peak pressure: 21 mm Hg (S).   Dg Chest 2 View  Result Date: 01/10/2018 CLINICAL DATA:  Fever, altered mental status. EXAM: CHEST - 2 VIEW COMPARISON:  None. FINDINGS: Enlarged cardiac silhouette. Calcific atherosclerotic disease of the aorta and tortuosity. Mediastinal contours appear intact. There is no evidence of focal airspace consolidation, pleural effusion or pneumothorax. Low lung volumes with exaggeration of the interstitial markings. Thoracic kyphosis with stable changes of spondylosis of the thoracic spine. Soft tissues are grossly normal. IMPRESSION: No active cardiopulmonary disease. Enlarged heart. Electronically Signed   By: Fidela Salisbury M.D.   On: 01/10/2018 16:04   Ct Head Wo Contrast  Result Date: 01/12/2018 CLINICAL DATA:  Acute onset of nausea and vomiting. Nystagmus. EXAM: CT HEAD WITHOUT CONTRAST TECHNIQUE: Contiguous axial images were obtained from the base of the skull through the vertex without intravenous contrast. COMPARISON:  CT of the head performed 01/10/2018, and MRI of the brain performed 01/11/2018 FINDINGS: Brain: No evidence of acute infarction, hemorrhage, hydrocephalus, extra-axial collection or mass lesion / mass effect. Prominence of the ventricles and sulci reflects mild cortical volume loss. Mild periventricular white matter change likely reflects small vessel ischemic microangiopathy. The brainstem and fourth ventricle are within normal limits. The basal ganglia are unremarkable in appearance. The cerebral hemispheres demonstrate grossly normal gray-white differentiation. No mass effect or midline shift is seen. Vascular: No hyperdense vessel or unexpected calcification. Skull: There is no evidence of fracture; visualized osseous structures are unremarkable in appearance. Sinuses/Orbits: The visualized portions of the orbits are within  normal limits. The paranasal sinuses and mastoid air cells are well-aerated. Other: No significant soft tissue abnormalities are seen. IMPRESSION: 1. No acute intracranial pathology seen on CT. 2. Mild cortical volume loss and scattered small vessel ischemic microangiopathy. Electronically Signed   By: Garald Balding M.D.   On: 01/12/2018 06:28   Ct Head Wo Contrast  Result Date: 01/10/2018 CLINICAL DATA:  Altered level of consciousness. EXAM: CT HEAD WITHOUT CONTRAST TECHNIQUE: Contiguous axial images were obtained from the base of the skull through the vertex without intravenous contrast. COMPARISON:  CT scan of January 16, 2015. FINDINGS: Brain: Mild diffuse cortical atrophy is noted. Mild chronic ischemic white matter disease is noted. No mass effect or midline shift is noted. Ventricular size is within normal limits. There is no evidence of mass lesion, hemorrhage or acute infarction. Vascular: No hyperdense vessel or unexpected calcification. Skull: Normal. Negative for fracture or focal lesion. Sinuses/Orbits: No acute finding. Other: None. IMPRESSION: Mild diffuse cortical atrophy. Mild chronic ischemic white matter disease. No acute intracranial abnormality seen. Electronically Signed  By: Marijo Conception, M.D.   On: 01/10/2018 16:20   Mr Brain W And Wo Contrast  Result Date: 01/11/2018 CLINICAL DATA:  Altered mental status EXAM: MRI HEAD WITHOUT AND WITH CONTRAST TECHNIQUE: Multiplanar, multiecho pulse sequences of the brain and surrounding structures were obtained without and with intravenous contrast. CONTRAST:  64mL MULTIHANCE GADOBENATE DIMEGLUMINE 529 MG/ML IV SOLN COMPARISON:  Head CT 01/10/2018 FINDINGS: BRAIN: The midline structures are normal. There is no acute infarct or acute hemorrhage. There is no mass lesion or other mass effect. There is no hydrocephalus, dural abnormality or extra-axial collection. Multifocal white matter hyperintensity, most commonly due to chronic ischemic  microangiopathy. No age-advanced or lobar predominant atrophy. No chronic microhemorrhage or superficial siderosis. VASCULAR: Major intracranial arterial and venous sinus flow voids are preserved. SKULL AND UPPER CERVICAL SPINE: The visualized skull base, calvarium, upper cervical spine and extracranial soft tissues are normal. SINUSES/ORBITS: No fluid levels or advanced mucosal thickening. No mastoid or middle ear effusion. The orbits are normal. 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 Electronically Signed   By: Ulyses Jarred M.D.   On: 01/11/2018 04:43   Mr [place] Contrast  Result Date: 01/13/2018 CLINICAL DATA:  Severe vertigo.  Acute onset nystagmus, nausea EXAM: MRI HEAD WITHOUT AND WITH CONTRAST TECHNIQUE: Multiplanar, multiecho pulse sequences of the brain and surrounding structures were obtained without and with intravenous contrast. CONTRAST:  73mL MULTIHANCE GADOBENATE DIMEGLUMINE 529 MG/ML IV SOLN COMPARISON:  MRI head 01/11/2018, CT 01/12/2018 FINDINGS: Brain: IAC protocol was performed including thin section imaging through the posterior fossa before and after intravenous contrast. Seventh and eighth cranial nerves normal. No enhancing mass lesion. Basilar cisterns normal. Mastoid sinus is clear bilaterally. Brainstem and cerebellum normal. No enhancing mass in the temporal bone or posterior fossa. Normal venous enhancement. Mild atrophy. Ventricle size normal. Negative for acute infarct. Mild chronic microvascular ischemic change in the white matter. Negative for hemorrhage or mass. Vascular: Normal arterial flow void Skull and upper cervical spine: Negative Sinuses/Orbits: Mild mucosal edema paranasal sinuses.  Normal orbit Other: None IMPRESSION: No cause for vertigo or nystagmus identified. Negative for acute infarct or mass. Mild chronic microvascular ischemic change in the white matter. Electronically Signed   By: Franchot Gallo M.D.   On:  01/13/2018 07:20     Subjective: -anxious about wanting to go home. She doesn't want to wait for inpatient rehab and asking to go home.   Discharge Exam: Vitals:   01/15/18 0102 01/15/18 0414  BP: 134/88 (!) 141/84  Pulse: 78 73  Resp: 18 18  Temp: 98 F (36.7 C) 98 F (36.7 C)  SpO2: 97% 96%    General: Pt is alert, awake, not in acute distress Cardiovascular: RRR, S1/S2 +, no rubs, no gallops Respiratory: CTA bilaterally, no wheezing, no rhonchi Abdominal: Soft, NT, ND, bowel sounds + Extremities: no edema, no cyanosis    The results of significant diagnostics from this hospitalization (including imaging, microbiology, ancillary and laboratory) are listed below for reference.     Microbiology: Recent Results (from the past 240 hour(s))  Urine culture     Status: None   Collection Time: 01/10/18  3:04 PM  Result Value Ref Range Status   Specimen Description URINE, RANDOM  Final   Special Requests NONE  Final   Culture   Final    NO GROWTH Performed at [facility], [address] [place], [place] [address]    Report Status 01/11/2018 FINAL  Final  Blood Culture (routine x 2)     Status: None (Preliminary result)   Collection Time: 01/10/18  3:10 PM  Result Value Ref Range Status   Specimen Description BLOOD LEFT HAND  Final   Special Requests   Final    BOTTLES DRAWN AEROBIC AND ANAEROBIC Blood Culture adequate volume   Culture   Final    NO GROWTH 4 DAYS Performed at [facility], [address] [place], [place] [address]    Report Status PENDING  Incomplete  Blood Culture (routine x 2)     Status: None (Preliminary result)   Collection Time: 01/10/18  3:16 PM  Result Value Ref Range Status   Specimen Description BLOOD RIGHT ANTECUBITAL  Final   Special Requests   Final    BOTTLES DRAWN AEROBIC AND ANAEROBIC Blood Culture adequate volume   Culture   Final    NO GROWTH 4 DAYS Performed at [facility], [place] [address] [place],  [place] [address]    Report Status PENDING  Incomplete  CSF culture     Status: None   Collection Time: 01/10/18  5:41 PM  Result Value Ref Range Status   Specimen Description CSF  Final   Special Requests NONE  Final   Gram Stain   Final    CYTOSPIN SMEAR WBC PRESENT, PREDOMINANTLY MONONUCLEAR NO ORGANISMS SEEN    Culture   Final    NO GROWTH 3 DAYS Performed at [facility], [place] [address], [place], [place] [address]    Report Status 01/14/2018 FINAL  Final     Labs: BNP (last 3 results) No results for input(s): BNP in the last 8760 hours. Basic Metabolic Panel: Recent Labs  Lab 01/10/18 1514 01/11/18 0610 01/14/18 0419  NA 130* 141 136  K 3.3* 3.5 3.3*  CL 98* 110 107  CO2 21* 21* 23  GLUCOSE 95 143* 126*  BUN 11 9 8   CREATININE 0.82 0.64 0.60  CALCIUM 8.1* 7.9* 7.3*   Liver Function Tests: Recent Labs  Lab 01/10/18 1514 01/11/18 0610  AST 81* 73*  ALT 50 48  ALKPHOS 59 56  BILITOT 1.7* 1.1  PROT 5.7* 5.5*  ALBUMIN 3.1* 2.8*   No results for input(s): LIPASE, AMYLASE in the last 168 hours. No results for input(s): AMMONIA in the last 168 hours. CBC: Recent Labs  Lab 01/10/18 1514 01/11/18 0610 01/12/18 0636 01/13/18 0745 01/14/18 0419  WBC 2.3* 4.1 5.8 11.4* 15.0*  NEUTROABS 1.5*  --  3.9 7.0  --   HGB 14.0 14.6 13.2 13.7 13.1  HCT 40.7 43.2 38.6 39.7 38.4  MCV 80.0 80.6 80.6 78.6 79.8  PLT 69* 75* 62* 114* 163   Cardiac Enzymes: No results for input(s): CKTOTAL, CKMB, CKMBINDEX, TROPONINI in the last 168 hours. BNP: Invalid input(s): POCBNP CBG: Recent Labs  Lab 01/10/18 1511  GLUCAP 81   D-Dimer No results for input(s): DDIMER in the last 72 hours. Hgb A1c No results for input(s): HGBA1C in the last 72 hours. Lipid Profile No results for input(s): CHOL, HDL, LDLCALC, TRIG, CHOLHDL, LDLDIRECT in the last 72 hours. Thyroid function studies No results for input(s): TSH, T4TOTAL, T3FREE, THYROIDAB in the last 72 hours.  Invalid  input(s): FREET3 Anemia work up No results for input(s): VITAMINB12, FOLATE, FERRITIN, TIBC, IRON, RETICCTPCT in the last 72 hours. Urinalysis    Component Value Date/Time   COLORURINE YELLOW 01/10/2018 1721   APPEARANCEUR HAZY (A) 01/10/2018 1721  LABSPEC 1.008 01/10/2018 1721   PHURINE 5.0 01/10/2018 1721   GLUCOSEU NEGATIVE 01/10/2018 1721   GLUCOSEU NEGATIVE 12/23/2017 1242   HGBUR MODERATE (A) 01/10/2018 1721   BILIRUBINUR NEGATIVE 01/10/2018 1721   KETONESUR 20 (A) 01/10/2018 1721   PROTEINUR NEGATIVE 01/10/2018 1721   UROBILINOGEN 0.2 12/23/2017 1242   NITRITE NEGATIVE 01/10/2018 1721   LEUKOCYTESUR NEGATIVE 01/10/2018 1721   Sepsis Labs Invalid input(s): PROCALCITONIN,  WBC,  LACTICIDVEN   Time coordinating discharge: 35 minutes  SIGNED:  Marzetta Board, MD  [facility] 01/15/2018, 11:33 AM Pager [phone]  If 7PM-7AM, please contact night-coverage [email or web address] Password [email or web address]

## 2018-01-17 DIAGNOSIS — M5384 Other specified dorsopathies, thoracic region: Secondary | ICD-10-CM | POA: Diagnosis not present

## 2018-01-17 DIAGNOSIS — M9901 Segmental and somatic dysfunction of cervical region: Secondary | ICD-10-CM | POA: Diagnosis not present

## 2018-01-17 DIAGNOSIS — M9902 Segmental and somatic dysfunction of thoracic region: Secondary | ICD-10-CM | POA: Diagnosis not present

## 2018-01-17 DIAGNOSIS — M50123 Cervical disc disorder at C6-C7 level with radiculopathy: Secondary | ICD-10-CM | POA: Diagnosis not present

## 2018-01-18 ENCOUNTER — Telehealth: Payer: Self-pay

## 2018-01-18 DIAGNOSIS — M50123 Cervical disc disorder at C6-C7 level with radiculopathy: Secondary | ICD-10-CM | POA: Diagnosis not present

## 2018-01-18 DIAGNOSIS — M9902 Segmental and somatic dysfunction of thoracic region: Secondary | ICD-10-CM | POA: Diagnosis not present

## 2018-01-18 DIAGNOSIS — M9901 Segmental and somatic dysfunction of cervical region: Secondary | ICD-10-CM | POA: Diagnosis not present

## 2018-01-18 DIAGNOSIS — M5384 Other specified dorsopathies, thoracic region: Secondary | ICD-10-CM | POA: Diagnosis not present

## 2018-01-18 NOTE — Telephone Encounter (Signed)
Received incoming call from pt she seen Dr. Lovena Le in the hospital and was calling to make a f/u apt informed her that I would send this over to Dr. Tanna Furry scheduler Lenna Sciara and she would call her back, pt voiced understanding.

## 2018-01-18 NOTE — Consult Note (Signed)
            [facility] CM Primary Care Navigator  01/18/2018  Carly Rhodes 07/23/1946 [id number]   Attempt to seepatient at the bedsideto identify possible discharge needs but she was alreadydischargedover the weekend.  Patient was discharged home with home health services (declined inpatient rehab).  PerMD note,patient was admitted for acute encephalopathy, febrile illness, UTI, atrial fibrillation- new onset,concern for tachybradycardia syndrome.  Patient has discharge instruction to follow-up with primary care provider in 1- 2 weeks, cardiology follow-up as an outpatient in the A-fib clinic on 6/17.   For additional questions please contact:  Edwena Felty A. Lenia Housley, BSN, RN-BC [facility] PRIMARY CARE Navigator Cell: [phone]

## 2018-01-20 ENCOUNTER — Ambulatory Visit: Payer: PPO

## 2018-01-20 DIAGNOSIS — M5384 Other specified dorsopathies, thoracic region: Secondary | ICD-10-CM | POA: Diagnosis not present

## 2018-01-20 DIAGNOSIS — M9902 Segmental and somatic dysfunction of thoracic region: Secondary | ICD-10-CM | POA: Diagnosis not present

## 2018-01-20 DIAGNOSIS — M50123 Cervical disc disorder at C6-C7 level with radiculopathy: Secondary | ICD-10-CM | POA: Diagnosis not present

## 2018-01-20 DIAGNOSIS — M9901 Segmental and somatic dysfunction of cervical region: Secondary | ICD-10-CM | POA: Diagnosis not present

## 2018-01-21 ENCOUNTER — Ambulatory Visit (INDEPENDENT_AMBULATORY_CARE_PROVIDER_SITE_OTHER): Payer: PPO

## 2018-01-21 DIAGNOSIS — E538 Deficiency of other specified B group vitamins: Secondary | ICD-10-CM | POA: Diagnosis not present

## 2018-01-24 MED ORDER — CYANOCOBALAMIN 1000 MCG/ML IJ SOLN
1000.0000 ug | Freq: Once | INTRAMUSCULAR | Status: AC
  Administered 2018-01-21: 1000 ug via INTRAMUSCULAR

## 2018-01-25 ENCOUNTER — Telehealth: Payer: Self-pay

## 2018-01-25 DIAGNOSIS — M50123 Cervical disc disorder at C6-C7 level with radiculopathy: Secondary | ICD-10-CM | POA: Diagnosis not present

## 2018-01-25 DIAGNOSIS — M5384 Other specified dorsopathies, thoracic region: Secondary | ICD-10-CM | POA: Diagnosis not present

## 2018-01-25 DIAGNOSIS — M9901 Segmental and somatic dysfunction of cervical region: Secondary | ICD-10-CM | POA: Diagnosis not present

## 2018-01-25 DIAGNOSIS — M9902 Segmental and somatic dysfunction of thoracic region: Secondary | ICD-10-CM | POA: Diagnosis not present

## 2018-01-25 NOTE — Telephone Encounter (Signed)
Patient called regarding her husband but also wanted to get an appointment for herself with Dr Lovena Le.  Advised she will need to contact the office to schedule an appointment. She said she will call tomorrow.

## 2018-01-26 ENCOUNTER — Telehealth: Payer: Self-pay | Admitting: Cardiology

## 2018-01-26 ENCOUNTER — Inpatient Hospital Stay: Payer: PPO | Admitting: Pulmonary Disease

## 2018-01-26 NOTE — Telephone Encounter (Signed)
Patient calling and stated that she would like to make an appointment with Dr. Lovena Le. Call forwarded to Dr. Lovena Le.

## 2018-01-27 ENCOUNTER — Encounter: Payer: Self-pay | Admitting: Pulmonary Disease

## 2018-01-27 ENCOUNTER — Ambulatory Visit: Payer: PPO | Admitting: Pulmonary Disease

## 2018-01-27 VITALS — BP 112/64 | HR 76 | Temp 98.0°F | Ht 65.5 in | Wt 163.6 lb

## 2018-01-27 DIAGNOSIS — Z636 Dependent relative needing care at home: Secondary | ICD-10-CM | POA: Diagnosis not present

## 2018-01-27 DIAGNOSIS — G9340 Encephalopathy, unspecified: Secondary | ICD-10-CM | POA: Diagnosis not present

## 2018-01-27 DIAGNOSIS — R059 Cough, unspecified: Secondary | ICD-10-CM

## 2018-01-27 DIAGNOSIS — E785 Hyperlipidemia, unspecified: Secondary | ICD-10-CM

## 2018-01-27 DIAGNOSIS — M159 Polyosteoarthritis, unspecified: Secondary | ICD-10-CM

## 2018-01-27 DIAGNOSIS — B009 Herpesviral infection, unspecified: Secondary | ICD-10-CM

## 2018-01-27 DIAGNOSIS — E042 Nontoxic multinodular goiter: Secondary | ICD-10-CM | POA: Diagnosis not present

## 2018-01-27 DIAGNOSIS — G4700 Insomnia, unspecified: Secondary | ICD-10-CM

## 2018-01-27 DIAGNOSIS — M150 Primary generalized (osteo)arthritis: Secondary | ICD-10-CM

## 2018-01-27 DIAGNOSIS — R5383 Other fatigue: Secondary | ICD-10-CM

## 2018-01-27 DIAGNOSIS — N814 Uterovaginal prolapse, unspecified: Secondary | ICD-10-CM | POA: Diagnosis not present

## 2018-01-27 DIAGNOSIS — R05 Cough: Secondary | ICD-10-CM

## 2018-01-27 DIAGNOSIS — F341 Dysthymic disorder: Secondary | ICD-10-CM | POA: Diagnosis not present

## 2018-01-27 DIAGNOSIS — E538 Deficiency of other specified B group vitamins: Secondary | ICD-10-CM | POA: Diagnosis not present

## 2018-01-27 MED ORDER — BACLOFEN 10 MG PO TABS: 10.0000 mg | ORAL_TABLET | Freq: Every day | ORAL | 6 refills | Status: DC

## 2018-01-27 NOTE — Progress Notes (Signed)
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 RR w/o m/r/g;   Abd- soft, neg;  Ext- VI w/o c/c/e.   CXR 01/10/18>  Borderline heart size, aortic calcif, sl incr markings w/ low lung vol- NAD, kyphosis w/ spondylosis...   EKG 01/10/18>  AFib w/ rvr, rate 126, NSSTTWA...  2DEcho 01/11/18>  Norm LVF w/ EF=55-60%, no RWMA, normal valves w/ mild MR, LA dil at 333m PAsys=2170mg...   CT Head 5/27 & 01/12/18>  No acute intracranial pathology, mild cortical vol loss & small vessel dis  MRI Head 5/28 & 01/13/18>  Chr ischemic microangiopathy w/o acute intracranial  abn; no cause for vertigo identified...  LABS 12/2017 in epic>  Duran w/ K=3.5, BS~130, Cr~0.64; Alb~2.8, AST=73;  CBC- Hg~13, WBC~15K;  CSF- sl traumatic, otherw NEF CSF formula... IMP/PLAN>>  As above- no etiology for the fever, HA, confusion was ascertained- called an acute encephalopathy & resolved spont (in assoc w/ treatment as described);  PAF converted spont & she remains on Amio200 & Eliquis5Bid for now- f/u w/ CARDS-EP due soon;  Dizziness, acute labyrinthitis resolved spont & hasn't recurred;  She remains  fatigued/ tired/ weak in assoc w/ the stress she has been under trying to care for her elderly husb;  We plan ROV recheck in 76mo..   NOTE:  >50% of this 430m ROV was spent in counseling & coordination of care...          Current Problems:   HYPERCHOLESTEROLEMIA (ICD-272.0) - on diet, exercise & OTC meds;  she prev refused to consider prescription drug therapy for this problem... ~  FLSedan/12 showed TChol 242, TG 142, HDL 56, LDL 157... she refuses med rx. ~  FLChelsea2/13 on diet alone showed TChol 242, TG 162, HDL 43, LDL 169... She has agreed to try CRESTOR '10mg'$ /d => she never started this med. ~  FLFolcroft0/15 on diet alone showed TChol 226, TG 106, HDL 40, LDL 165... Rec to start ATORVA20, she will decide (refused statin, she'll try Zetia). ~  FL[place]/16 on Zetia10 showed TChol 159, TG 143, HDL 40, LDL 90... Continue same. ~  [place]/16 on Zetia10 showed TChol 163, TG 179, HDL 45, LDL 82...  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 we do not have the details of the prev eval (?by ENT DrByers?)... TFT's here were normal--- looks like she had a thyroid bx by DrYamagata ordered by DrDickstein 6/09= c/w hyperplastic nodule/ non-neoplastic goiter (this bx was difficult & painful she recalls)... ~  labs 3/08 showed TSH = 0.47 ~  labs 3/09 showed TSH = 0.55 ~  Thyroid Sonar 5/09 showed diffusely abn thyroid c/w multinod gland & bilat biopsies done 6/8/67evealing follicular cells/ non-neoplastic goiter... ~  labs 3/10 showed TSH= 0.43 ~  labs 3/12 showed TSH= 0.23=> she never ret for f/u labs as requested. ~  Labs 12/13 showed TSH=0.44, FreeT3=2.6 (2.3-4.2), FreeT4=0.86 (0.60-1.60)... ~  f/u Thyroid Sonar 12/13 showed multinod gland w/ most nodules unchanged; but NEW 3.4cm dominant left lower pole nodule & Bx is rec... ~  Thyroid surg 1/14 by DrByers- left thyroidectomy for a "mass" and path revealed BENIGN MULTINODULAR THYROID WITH HYPERPLASTIC  NODULES AND FOCALLY HYALINIZED AND CALCIFIED PARENCHYMA, NEGATIVE FOR ATYPIA OR MALIGNANCY... ~  Labs 10/15 showed TSH= 0.75 ~  Labs 11/16 showed TSH= 0.74  COLONOSCOPY >> she had a routine screening colonoscopy 2/13 by DrDBrodie- it was normal, no polyps or lesions, no divertics etc... F/u planned 1024yr GALLSTONES >> multiple small gallstones were noted on a CXR 9/13... Hx of HEPATITIS A >> age [age]o known residual problems... ~  LFTs have remained WNL...  GYN >> she was followed by DrDickstein; now in need of another Gyn & f/u for PAP, Mammogram (she promises to call & set up this important screening eval)...   [facility] & 2 anterior left ribs >>  MULT ORTHOPEDIC ISSUES >> all attended by DrFields etaHarriett Sine Hx of LOW BACK PAIN SYNDROME (ICD-724.2) - prev evals from chiropractor and DrFields for sports medicine...   OSTEOPOROSIS >>  she was on & off Alendronate'70mg'$ /wk... ~  BMD 2009 reported from GYN showed lowest Tscore = -2.9.Marland KitchenMarland Kitchen She initially refused Bisphos therapy & later took it intermittently... ~  BMD 10/14 showed lowest Tscore = -2.1 in Spine (improved from Gyn check in 2009); Rec to continue Calcium, MVI, VitD supplements and wt bearing exercise, she decided to restart Alendronate70/wk. ~  Labs 10/15 showed VitD level = 62 ~  Labs 11/16 showed Vit D = 52 ~  BMD done 07/07/15, off Alendronate (stopped on her own)>  Tscores -1.7 in Lspine (improved 1.2%) & -1.5 in Bilat FemNecks, improved from prev & OK to leave off Alendronate x40yr and repeat BMD in 2018...  DYSTHYMIA (ICD-300.4) - prev taking Xanax 0.'5mg'$  Qhs Prn and Fluoxetine '40mg'$ /d... she states "I've had a melt-down & left my husb after 473yrof marriage"... she is seeing Dr. ClMarden Noblesychologist for counselling help "he says it's grief"... she is requesting to change to her sister's medication = EffexorXR, instead of Lexapro... ~  2009: we accommodated her requests- tried Lexapro- too $$$, Effexor-  caused nightmares, then Zoloft, and now on generic Prozac '40mg'$ /d...  ~  11/10: she remains on Prozac'40mg'$ /d & wishes to continue Rx as she feels this is continuing to help... ~  3/12:  ditto- continues on Prozac40 & Alpraz 0.'5mg'$  Prn & requests refills... ~  12/13:  She is off the prev Xanax & Prozac- apparently she is seeing a Psychiatrist but she couldn't tell me who or what med they changed her to... ~  5/14: She had f/u 5/14 w/ TP for anxiety & depression> she stopped her Lexapro (wasn't helping) & requested to go back on Prozac40; getting counselling which she feels is helpful; notes family stress & rec to continue talking/counseling ~  10/15: she has remained on Prozac40 & Xanax0.5 prn (taking 1Qhs) and stable; she requests to continue the same meds...   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 [facility] lipoma excision and removal of dystrophic right great toenail by DrMarks in 2009.  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  HEALTH MAINTENANCE:   ~  [facility]:  [facility] we do not have this report....  ~  GYN:  routine GYN- saw DrDickstein in 2009... [facility] but hasn't had a BMD- "they don't work, it was in the news"--- finally had BMD 6/09 by WeErling ConteYN= TScores -2.9 in spine,  [facility] Rx by DrDickstein on ALENDRONATE... Vit D level 3/10= 41...  ~  IMMUNIZ:  Given Pneumovax 10/14... Had TDAP 2011 in hosp ER... Gets yearly Flu vaccine   Past Surgical History:  Procedure Laterality Date  . ABDOMINAL HYSTERECTOMY  1984  . APPENDECTOMY  1984   at time of Hysterectomy  . HAMMER TOE SURGERY  1993   2nd toe left foot  . THYROID LOBECTOMY  09/15/2012   Procedure: THYROID LOBECTOMY;  Surgeon: JoMelissa MontaneMD;  [place] [facility]  Service: ENT;  Laterality: Left;  . THYROIDECTOMY, PARTIAL  09/15/2012   Dr ByJanace Hoard. TONSILLECTOMY  1957    Outpatient Encounter Medications as of 01/27/2018  Medication Sig  . acetaminophen (TYLENOL) 325 MG tablet Take 325-650 mg by mouth every 6 (six) hours  as needed for headache (pain).  Marland Kitchen ALPRAZolam (XANAX) 0.5 MG tablet TAKE 1/2 TO 1 (ONE-HALF TO ONE) TABLET BY MOUTH THREE TIMES DAILY AS NEEDED (Patient taking differently: TAKE 2 TABLETS BY MOUTH DAILY AT BEDTIME)  . amiodarone (PACERONE) 200 MG tablet Take 1 tablet (200 mg total) by mouth daily.  Marland Kitchen apixaban (ELIQUIS) 5 MG TABS tablet Take 1 tablet (5 mg total) by mouth 2 (two) times daily.  . Ascorbic Acid (VITAMIN C PO) Take 1 tablet by mouth daily.  Marland Kitchen b complex vitamins tablet Take 1 tablet by mouth daily.  . cyanocobalamin (,VITAMIN B-12,) 1000 MCG/ML injection Inject 1,000 mcg into the muscle every 30 (thirty) days.  Marland Kitchen ezetimibe (ZETIA) 10 MG tablet TAKE 1 TABLET BY MOUTH ONCE DAILY  . Multiple Vitamin (MULTIVITAMIN WITH MINERALS) TABS tablet Take 1 tablet by mouth daily. Centrum  . Omega-3 Fatty Acids (FISH OIL PO) Take 2 capsules by mouth daily.  . [DISCONTINUED] acetaminophen-codeine (TYLENOL #3) 300-30 MG tablet Take one tab at bedtime as needed (Patient taking differently: Take 1 tablet by mouth at bedtime as needed (pain). )  . [DISCONTINUED] predniSONE (DELTASONE) 10 MG tablet Take 6 tablets (60 mg total) by mouth daily. 6 tablets daily for 3 days then on the following days take 5, 4, 3, 2, 1 tablets then stop.  . baclofen (LIORESAL) 10 MG tablet Take 1 tablet (10 mg total) by mouth at bedtime.  . [DISCONTINUED] baclofen (LIORESAL) 10 MG tablet TAKE 1 TABLET BY MOUTH ONCE DAILY AT BEDTIME (Patient not taking: Reported on 01/27/2018)   No facility-administered encounter medications on file as of 01/27/2018.     Allergies  Allergen Reactions  . Procaine Other (See Comments)    REACTION:  Lowers blood pressure, "makes me  loopy, can't move or talk"    Immunization History  Administered Date(s) Administered  . Influenza Split 05/20/2011, 06/23/2012  . Influenza Whole 07/15/2009, 05/22/2010  . Influenza, High Dose Seasonal PF 06/17/2016, 06/15/2017  . Influenza,inj,Quad PF,6+ Mos 06/30/2013, 07/02/2014, 07/01/2015  . Pneumococcal Polysaccharide-23 05/31/2013  . Tdap 01/16/2015    Current Medications, Allergies, Past Medical History, Past Surgical History, Family History, and Social History were reviewed in [employer] record.   Review of Systems        The patient complains of recent URI symproms- cough/ congestion, sore throat + gas/bloating, back pain, stiffness, arthritis, depression.  The patient denies fever, chills, sweats, anorexia, fatigue, weakness, malaise, weight loss, sleep disorder, blurring, diplopia, eye irritation, eye discharge, vision loss, eye pain, photophobia, earache, ear discharge, tinnitus, decreased hearing, nasal congestion, nosebleeds, sore throat, hoarseness, chest pain, palpitations, syncope, dyspnea on exertion, orthopnea, PND, peripheral edema, cough, dyspnea at rest, excessive sputum, hemoptysis, wheezing, pleurisy, nausea, vomiting, diarrhea, constipation, change in bowel habits, abdominal pain, melena, hematochezia, jaundice, indigestion/heartburn, dysphagia, odynophagia, dysuria, hematuria, urinary frequency, urinary hesitancy, nocturia, incontinence, joint pain, joint swelling, muscle cramps, muscle weakness, sciatica, restless legs, leg pain at night, leg pain with exertion, rash, itching, dryness, suspicious lesions, paralysis, paresthesias, seizures, tremors, vertigo, transient blindness, frequent falls, frequent headaches, difficulty walking, anxiety, memory loss, confusion, cold intolerance, heat intolerance, polydipsia, polyphagia, polyuria, unusual weight change, abnormal bruising, bleeding, enlarged lymph nodes, urticaria, allergic rash, and recurrent  infections.     Objective:   Physical Exam     WD, WN, 73 y/o WF in NAD... GENERAL:  Alert & oriented; pleasant & cooperative... HEENT:  [place]/AT, EOM-full, PERRLA, EACs-clear, TMs-normal, NOSE-sl pale, THROAT- sl erythema w/o exud... NECK:  Supple w/ fairROM; no JVD; normal carotid impulses w/o bruits; +thyromegaly w/ coarse bilat nodularity; no lymphadenopathy. CHEST:  Coarse BS w/ no wheezes, rales, or signs of consolidation. HEART:  Regular Rhythm; without murmurs/ rubs/ or gallops. ABDOMEN:  Soft & nontender; incr bowel sounds; no organomegaly or masses detected. EXT: without deformities or arthritic changes; no varicose veins/ venous insuffic/ or edema. NEURO:  no focal neuro deficits... DERM: no skin lesions or rash identified...  RADIOLOGY DATA:  Reviewed in the EPIC EMR & discussed w/ the patient...  LABORATORY DATA:  Reviewed in the EPIC EMR & discussed w/ the patient...   Assessment:      ACUTE ADM 5/27 - 01/15/18 for confusion, HA, AFib w/ rvr, acute labyrinthitis>  Hosp records reviewed in detail- confusion/encephalopathy resolved, dizziness resolved, AFib converted to NSR spont...  01/27/18>   As above- no etiology for the fever, HA, confusion was ascertained- called an acute encephalopathy & resolved spont (in assoc w/ treatment as described);  PAF converted spont & she remains on Amio200 & Eliquis5Bid for now- f/u w/ CARDS-EP due soon;  Dizziness, acute labyrinthitis resolved spont & hasn't recurred;  She remains fatigued/ tired/ weak in assoc w/ the stress she has been under trying to care for her elderly husb;  We plan ROV recheck in 57mo  PAF>  This occurred 12/2017 & noted on adm to hosp, she developed 2 pauses on Metoprolol, EP considered pacer, she converted to NSR on her own & was placed on Amio200 & Eliquis5Bid; f/u w/ CARDS-EP is pending...   Chol>  FLP remained deranged on diet alone & she seems genuinely surprized since she exercises all the time & wt is good;  explained the hereditary nature of the prob & need for meds; she refused statin but agreed to Zetia10>  5/16 to 11/17> FLP much improved on Zetia10 and all parameters are wnl (x TG & reminded of low fat diet).  Thyroid>  Thyroid function is wnl, nodules are palp & Sonar 12/13 showed most nodules stable in size but dom nodule left lower pole is NEW & subseq left thyroid lobectomy surg by DrByers 1/14 (see above)...  Episode confusion/ HA, CT/MRI w/ small vessel dis=> Hosp eval 5/19 was NEG, thought to have an acute encephalopathy, developed acute labyrinthitis in the Hosp=> all resolved in several days and she declined SNF- rehab & went home to care for husb...     Ortho- LBP, wrist, shoulder> she is managed by DrFields & is improved overall...  Osteop> [facility], [facility] w/ Alendronate '70mg'$ /wk but pt stopped on her own using calcium, MVI, VitD; f/u BMD 10/14 showed lowest Tscore -2.1 & she wants to continue Fosamax70, calcium, MVI, VitD (level = 62)... BMD 11/16 IMPROVED & Alendronate stopped for 222yroneymoon period...  B12 Deficiency>  She is delighted to see that her B12 level is sl low as she wants B12 shots and knows that this will give her more energy... REC to start B12 [id number] monthly => f/u B12 level is >1500 on these shots.  Anxiety/ Depression> she was stable on Prozac40 & Xanax 0.'5mg'$  as needed (taking one qhs); then she stopped the Prozac after reading something about potential side effects- offered Lexapro or Psyche referral for other med considerations but she declines...     Plan:     Patient's Medications  New Prescriptions   No medications on file  Previous Medications   ACETAMINOPHEN (  TYLENOL) 325 MG TABLET    Take 325-650 mg by mouth every 6 (six) hours as needed for headache (pain).   ALPRAZOLAM (XANAX) 0.5 MG TABLET    TAKE 1/2 TO 1 (ONE-HALF TO ONE) TABLET BY MOUTH THREE TIMES DAILY AS NEEDED    AMIODARONE (PACERONE) 200 MG TABLET    Take 1 tablet (200 mg total) by mouth daily.   APIXABAN (ELIQUIS) 5 MG TABS TABLET    Take 1 tablet (5 mg total) by mouth 2 (two) times daily.   ASCORBIC ACID (VITAMIN C PO)    Take 1 tablet by mouth daily.   B COMPLEX VITAMINS TABLET    Take 1 tablet by mouth daily.   CYANOCOBALAMIN (,VITAMIN B-12,) 1000 MCG/ML INJECTION    Inject 1,000 mcg into the muscle every 30 (thirty) days.   EZETIMIBE (ZETIA) 10 MG TABLET    TAKE 1 TABLET BY MOUTH ONCE DAILY   MULTIPLE VITAMIN (MULTIVITAMIN WITH MINERALS) TABS TABLET    Take 1 tablet by mouth daily. Centrum   OMEGA-3 FATTY ACIDS (FISH OIL PO)    Take 2 capsules by mouth daily.  Modified Medications   Modified Medication Previous Medication   BACLOFEN (LIORESAL) 10 MG TABLET baclofen (LIORESAL) 10 MG tablet      Take 1 tablet (10 mg total) by mouth at bedtime.    TAKE 1 TABLET BY MOUTH ONCE DAILY AT BEDTIME  Discontinued Medications   ACETAMINOPHEN-CODEINE (TYLENOL #3) 300-30 MG TABLET    Take one tab at bedtime as needed   PREDNISONE (DELTASONE) 10 MG TABLET    Take 6 tablets (60 mg total) by mouth daily. 6 tablets daily for 3 days then on the following days take 5, 4, 3, 2, 1 tablets then stop.

## 2018-01-27 NOTE — Patient Instructions (Signed)
Today we updated your med list in our EPIC system...    Continue your current medications the same...  We refilled your Baclofen as requested...  Take it slow & easy as you get your strength back...  Don't over do it caring for your husb at home...  Call for any questions...  Let's plan a follow up visit in July, sooner if needed for problems.Marland KitchenMarland Kitchen

## 2018-01-31 ENCOUNTER — Ambulatory Visit (HOSPITAL_COMMUNITY): Payer: PPO | Admitting: Nurse Practitioner

## 2018-02-02 ENCOUNTER — Other Ambulatory Visit: Payer: Self-pay | Admitting: Pulmonary Disease

## 2018-02-02 DIAGNOSIS — M545 Low back pain, unspecified: Secondary | ICD-10-CM

## 2018-02-02 DIAGNOSIS — F341 Dysthymic disorder: Secondary | ICD-10-CM

## 2018-02-03 ENCOUNTER — Telehealth: Payer: Self-pay | Admitting: Pulmonary Disease

## 2018-02-03 NOTE — Telephone Encounter (Signed)
Attempted to call pt. I did not receive an answer. I have left a message for pt to return our call.  

## 2018-02-07 NOTE — Telephone Encounter (Signed)
ATC patient. Call went straight to VM. Left a message for patient to call back.

## 2018-02-07 NOTE — Telephone Encounter (Signed)
Pt is returning call. 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 Pt would like to know when the infection will be over. Cb is [phone].

## 2018-02-07 NOTE — Telephone Encounter (Signed)
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 What is it infected with? How long will she have this infection? Will the infection ever completely resolve? Does she need to see a specialist for this?  SN please advise.  Thanks!

## 2018-02-08 NOTE — Telephone Encounter (Signed)
SN is aware and is going to call Patient later today.

## 2018-02-08 NOTE — Telephone Encounter (Signed)
Spoke with Lattie Haw; she is aware of the below message and speaking with SN this afternoon. Lattie Haw will follow up with pt on below concerns.

## 2018-02-11 ENCOUNTER — Ambulatory Visit: Payer: PPO | Admitting: Internal Medicine

## 2018-02-16 ENCOUNTER — Ambulatory Visit: Payer: PPO | Admitting: Internal Medicine

## 2018-02-16 ENCOUNTER — Encounter: Payer: Self-pay | Admitting: Internal Medicine

## 2018-02-16 VITALS — BP 122/74 | HR 65 | Ht 65.5 in | Wt 171.0 lb

## 2018-02-16 DIAGNOSIS — R001 Bradycardia, unspecified: Secondary | ICD-10-CM

## 2018-02-16 NOTE — Progress Notes (Signed)
HPI Carly Rhodes returns today for followup. I had seen her in the hospital when she had presented with AMS in the setting of an acute febrile illness, was found to have atrial fib with a RVR, placed initially on beta blockers where she had a long pause ans switched to low dose amiodarone and an [place]. She returns today and notes her heart rhythm has been good. She denies palpitations. Allergies  Allergen Reactions  . Procaine Other (See Comments)    REACTION:  Lowers blood pressure, "makes me loopy, can't move or talk"     Current Outpatient Medications  Medication Sig Dispense Refill  . acetaminophen (TYLENOL) 325 MG tablet Take 325-650 mg by mouth every 6 (six) hours as needed for headache (pain).    Marland Kitchen ALPRAZolam (XANAX) 0.5 MG tablet TAKE 1/2 TO 1 (ONE-HALF TO ONE) TABLET BY MOUTH THREE TIMES DAILY AS NEEDED 90 tablet 3  . amiodarone (PACERONE) 200 MG tablet Take 1 tablet (200 mg total) by mouth daily. 30 tablet 1  . apixaban (ELIQUIS) 5 MG TABS tablet Take 1 tablet (5 mg total) by mouth 2 (two) times daily. 60 tablet 1  . Ascorbic Acid (VITAMIN C PO) Take 1 tablet by mouth daily.    Marland Kitchen b complex vitamins tablet Take 1 tablet by mouth daily.    . baclofen (LIORESAL) 10 MG tablet Take 1 tablet (10 mg total) by mouth at bedtime. 30 tablet 6  . cyanocobalamin (,VITAMIN B-12,) 1000 MCG/ML injection Inject 1,000 mcg into the muscle every 30 (thirty) days.    Marland Kitchen ezetimibe (ZETIA) 10 MG tablet Take 10 mg by mouth daily.    . Multiple Vitamin (MULTIVITAMIN WITH MINERALS) TABS tablet Take 1 tablet by mouth daily. Centrum    . Omega-3 Fatty Acids (FISH OIL PO) Take 2 capsules by mouth daily.     No current facility-administered medications for this visit.      Past Medical History:  Diagnosis Date  . Anxiety   . Depression   . Family history of anesthesia complication    sister extreme nausea & vomiting  . 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 Osteoporosis   . Seasonal allergies      ROS:   All systems reviewed and negative except as noted in the HPI.   Past Surgical History:  Procedure Laterality Date  . ABDOMINAL HYSTERECTOMY  1984  . APPENDECTOMY  1984   at time of Hysterectomy  . HAMMER TOE SURGERY  1993   2nd toe left foot  . THYROID LOBECTOMY  09/15/2012   Procedure: THYROID LOBECTOMY;  Surgeon: Melissa Montane, MD;  Location: [place];  Service: ENT;  Laterality: Left;  . THYROIDECTOMY, PARTIAL  09/15/2012   Dr Janace Hoard  . TONSILLECTOMY  1957     Family History  Problem Relation Age of Onset  . Stomach cancer Brother 85  . Colon cancer Paternal Grandmother 14  . Diabetes Father   . Hypertension Sister   . Heart attack Neg Hx      Social History   Socioeconomic History  . Marital status: Divorced    Spouse name: Not on file  . Number of children: Not on file  . Years of education: Not on file  . Highest education level: Not on file  Occupational History  . Not on file  Social Needs  . Financial resource strain: Not on file  . Food insecurity:    Worry: Not on file  Inability: Not on file  . Transportation needs:    Medical: Not on file    Non-medical: Not on file  Tobacco Use  . Smoking status: Never Smoker  . Smokeless tobacco: Never Used  Substance and Sexual Activity  . Alcohol use: No  . Drug use: No  . Sexual activity: Not Currently    Birth control/protection: Post-menopausal, Surgical  Lifestyle  . Physical activity:    Days per week: Not on file    Minutes per session: Not on file  . Stress: Not on file  Relationships  . Social connections:    Talks on phone: Not on file    Gets together: Not on file    Attends religious service: Not on file    Active member of club or organization: Not on file    Attends meetings of clubs or organizations: Not on file    Relationship status: Not on file  . Intimate partner violence:    Fear of current or ex partner: Not on file    Emotionally abused: Not on file    Physically  abused: Not on file    Forced sexual activity: Not on file  Other Topics Concern  . Not on file  Social History Narrative  . Not on file     BP 122/74   Pulse 65   Ht 5' 5.5" (1.664 m)   Wt 171 lb (77.6 kg)   SpO2 97%   BMI 28.02 kg/m   Physical Exam:  Well appearing 72 yo woman, NAD HEENT: Unremarkable Neck:  6 cm JVD, no thyromegally Lymphatics:  No adenopathy Back:  No CVA tenderness Lungs:  Clear with no wheezes HEART:  Regular rate rhythm, no murmurs, no rubs, no clicks Abd:  soft, positive bowel sounds, no organomegally, no rebound, no guarding Ext:  2 plus pulses, no edema, no cyanosis, no clubbing Skin:  No rashes no nodules Neuro:  CN II through XII intact, motor grossly intact  EKG - nsr   Assess/Plan: 1. PAF - she is maintaining NSR. She will continue amiodarone. We will plan to reduce her dose in 4 months when I see her back. 2. Coags - no bleeding on her Enigma. Continue. 3. Sinus node dysfunction - she is asymptomatic and will undergo watchful waiting.  Mikle Bosworth.D.

## 2018-02-16 NOTE — Patient Instructions (Addendum)
Medication Instructions:  Your physician recommends that you continue on your current medications as directed. Please refer to the Current Medication list given to you today. Take your amiodarone and Eliquis as directed.  Labwork: None ordered.  Testing/Procedures: None ordered.  Follow-Up: Your physician wants you to follow-up in: 4 months with Dr. Lovena Le.       Any Other Special Instructions Will Be Listed Below (If Applicable).  If you need a refill on your cardiac medications before your next appointment, please call your pharmacy.

## 2018-02-18 ENCOUNTER — Ambulatory Visit: Payer: PPO

## 2018-02-20 ENCOUNTER — Other Ambulatory Visit: Payer: Self-pay | Admitting: Pulmonary Disease

## 2018-02-21 ENCOUNTER — Ambulatory Visit: Payer: PPO | Admitting: Pulmonary Disease

## 2018-02-28 ENCOUNTER — Ambulatory Visit: Payer: PPO | Admitting: Pulmonary Disease

## 2018-02-28 ENCOUNTER — Encounter: Payer: Self-pay | Admitting: Pulmonary Disease

## 2018-02-28 VITALS — BP 118/82 | HR 74 | Temp 98.1°F | Ht 65.5 in | Wt 169.0 lb

## 2018-02-28 DIAGNOSIS — F341 Dysthymic disorder: Secondary | ICD-10-CM

## 2018-02-28 DIAGNOSIS — Z636 Dependent relative needing care at home: Secondary | ICD-10-CM

## 2018-02-28 DIAGNOSIS — N814 Uterovaginal prolapse, unspecified: Secondary | ICD-10-CM | POA: Diagnosis not present

## 2018-02-28 DIAGNOSIS — E785 Hyperlipidemia, unspecified: Secondary | ICD-10-CM

## 2018-02-28 DIAGNOSIS — M159 Polyosteoarthritis, unspecified: Secondary | ICD-10-CM

## 2018-02-28 DIAGNOSIS — E538 Deficiency of other specified B group vitamins: Secondary | ICD-10-CM | POA: Diagnosis not present

## 2018-02-28 DIAGNOSIS — R5383 Other fatigue: Secondary | ICD-10-CM | POA: Diagnosis not present

## 2018-02-28 DIAGNOSIS — E042 Nontoxic multinodular goiter: Secondary | ICD-10-CM | POA: Diagnosis not present

## 2018-02-28 DIAGNOSIS — M150 Primary generalized (osteo)arthritis: Secondary | ICD-10-CM | POA: Diagnosis not present

## 2018-02-28 DIAGNOSIS — Z8669 Personal history of other diseases of the nervous system and sense organs: Secondary | ICD-10-CM

## 2018-02-28 NOTE — Patient Instructions (Signed)
Today we updated your med list in our EPIC system...    Continue your current medications the same...  Continue to eat well, get plenty of rest, and plan a modest exercise program to facilitate your recovery...   Get some help at home with your responsibilities-- caring for your husband, housework, etc...  We discussed checking your CXR & f/u blood work...    We will contact you w/ the results when available...   Call for any questions...  Let's plan a follow up visit in 52mo, sooner if needed for problems.Marland KitchenMarland Kitchen

## 2018-02-28 NOTE — Progress Notes (Signed)
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 to change to her sister's medication = EffexorXR, instead of Lexapro... ~  2009: we accommodated her requests- tried Lexapro- too $$$, Effexor- caused nightmares, then Zoloft, and now on generic Prozac 40mg/d35m ~  11/10: she remains on Prozac40mg/d 30mshes to continue Rx as she feels this is continuing to help... ~  3/12:  ditto- continues on Prozac40 & Alpraz 0.5mg Prn 61mequests refills... ~  12/13:  She is off the prev Xanax & Prozac- apparently she is seeing a Psychiatrist but she couldn't tell me who or what med they changed her to... ~  5/14: She had f/u 5/14 w/ TP for anxiety & depression> she stopped her Lexapro (wasn't helping) & requested to go back on Prozac40; getting counselling which she feels is helpful; notes family stress & rec to continue talking/counseling ~  10/15: she has remained on Prozac40 & Xanax0.5 prn (taking 1Qhs) and stable; she requests to continue the same meds...   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 she went to  plastic surg at [facility] w/ lipoma excision and removal of dystrophic right great toenail by DrMarks in 2009.  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  HEALTH MAINTENANCE:   ~  [facility]:  [facility] we do not have this report....  ~  GYN:  routine GYN- saw DrDickstein in 2009... [place] [place] [facility] but hasn't had a BMD- "they don't work, it was in the news"--- finally had BMD 6/09 by WErling ConteGYN= TScores -2.9 in spine,  [facility] by DrDickstein on ALENDRONATE... Vit D level 3/10= 41...  ~  IMMUNIZ:  Given Pneumovax 10/14... Had TDAP 2011 in hosp ER... Gets yearly Flu vaccine   Past Surgical History:  Procedure Laterality Date  . ABDOMINAL HYSTERECTOMY  1984  . APPENDECTOMY  1984   at time of Hysterectomy  . HAMMER TOE SURGERY  1993   2nd toe left foot  . THYROID LOBECTOMY  09/15/2012   Procedure: THYROID LOBECTOMY;  Surgeon: JMelissa Montane MD;  [facility]  Service: ENT;  Laterality: Left;  . THYROIDECTOMY, PARTIAL  09/15/2012   Dr BJanace Hoard . TONSILLECTOMY  1957    Outpatient Encounter Medications as of 02/28/2018  Medication Sig  . acetaminophen (TYLENOL) 325 MG tablet Take 325-650 mg by mouth every 6 (six) hours as needed for headache (pain).  .Marland KitchenALPRAZolam (XANAX) 0.5 MG tablet TAKE 1/2 TO 1 (ONE-HALF TO ONE) TABLET BY MOUTH THREE TIMES DAILY AS NEEDED  . amiodarone (PACERONE) 200 MG tablet Take 1 tablet (200 mg total) by mouth daily.  .Marland Kitchenapixaban (ELIQUIS) 5 MG TABS tablet Take 1 tablet (5 mg total) by mouth 2 (two) times daily.  . Ascorbic Acid (VITAMIN C PO) Take 1 tablet by mouth daily.  .Marland Kitchenb complex vitamins tablet Take 1 tablet by mouth daily.  . baclofen (LIORESAL) 10 MG tablet Take 1 tablet (10 mg total) by mouth at bedtime.  . cyanocobalamin (,VITAMIN B-12,) 1000 MCG/ML injection Inject 1,000 mcg into the muscle every 30 (thirty) days.  .Marland Kitchenezetimibe (ZETIA) 10 MG tablet Take 10 mg by mouth daily.   . Multiple Vitamin (MULTIVITAMIN WITH MINERALS) TABS tablet Take 1 tablet by mouth daily. Centrum  . Omega-3 Fatty Acids (FISH OIL PO) Take 2 capsules by mouth daily.   No facility-administered encounter medications on file as of 02/28/2018.     Allergies  Allergen Reactions  . Procaine Other (See Comments)    REACTION:  Lowers blood pressure, "makes me loopy, can't move or talk"    Immunization History  Administered Date(s) Administered  . Influenza Split 05/20/2011, 06/23/2012  . Influenza Whole 07/15/2009, 05/22/2010  . Influenza, High Dose Seasonal PF 06/17/2016, 06/15/2017  . Influenza,inj,Quad PF,6+ Mos 06/30/2013, 07/02/2014, 07/01/2015  . Pneumococcal Polysaccharide-23 05/31/2013  . Tdap 01/16/2015    Current Medications, Allergies, Past Medical History, Past Surgical History, Family History, and Social History were reviewed in [employer].   Review of Systems        The patient complains of recent URI symproms- cough/ congestion, sore throat + gas/bloating, back pain, stiffness, arthritis, depression.  The patient denies fever, chills, sweats, anorexia, fatigue, weakness, malaise, weight loss, sleep disorder, blurring, diplopia, eye irritation, eye discharge, vision loss, eye pain, photophobia, earache, ear discharge, tinnitus, decreased hearing, nasal  congestion, nosebleeds, sore throat, hoarseness, chest pain, palpitations, syncope, dyspnea on exertion, orthopnea, PND, peripheral edema, cough, dyspnea at rest, excessive sputum, hemoptysis, wheezing, pleurisy, nausea, vomiting, diarrhea, constipation, change in bowel habits, abdominal pain, melena, hematochezia, jaundice, indigestion/heartburn, dysphagia, odynophagia, dysuria, hematuria, urinary frequency, urinary hesitancy, nocturia, incontinence, joint pain, joint swelling, muscle cramps, muscle weakness, sciatica, restless legs, leg pain at night, leg pain with exertion, rash, itching, dryness,  suspicious lesions, paralysis, paresthesias, seizures, tremors, vertigo, transient blindness, frequent falls, frequent headaches, difficulty walking, anxiety, memory loss, confusion, cold intolerance, heat intolerance, polydipsia, polyphagia, polyuria, unusual weight change, abnormal bruising, bleeding, enlarged lymph nodes, urticaria, allergic rash, and recurrent infections.     Objective:   Physical Exam     WD, WN, 72 y/o WF in NAD... GENERAL:  Alert & oriented; pleasant & cooperative... HEENT:  Aneth/AT, EOM-full, PERRLA, EACs-clear, TMs-normal, NOSE-sl pale, THROAT- sl erythema w/o exud... NECK:  Supple w/ fairROM; no JVD; normal carotid impulses w/o bruits; +thyromegaly w/ coarse bilat nodularity; no lymphadenopathy. CHEST:  Coarse BS w/ no wheezes, rales, or signs of consolidation. HEART:  Regular Rhythm; without murmurs/ rubs/ or gallops. ABDOMEN:  Soft & nontender; incr bowel sounds; no organomegaly or masses detected. EXT: without deformities or arthritic changes; no varicose veins/ venous insuffic/ or edema. NEURO:  no focal neuro deficits... DERM: no skin lesions or rash identified...  RADIOLOGY DATA:  Reviewed in the EPIC EMR & discussed w/ the patient...  LABORATORY DATA:  Reviewed in the EPIC EMR & discussed w/ the patient...   Assessment:      ACUTE ADM 5/27 - 01/15/18 for confusion, HA, AFib w/ rvr, acute labyrinthitis>  Hosp records reviewed in detail- confusion/encephalopathy resolved, dizziness resolved, AFib converted to NSR spont...  01/27/18>   As above- no etiology for the fever, HA, confusion was ascertained- called an acute encephalopathy & resolved spont (in assoc w/ treatment as described);  PAF converted spont & she remains on Amio200 & Eliquis5Bid for now- f/u w/ [facility] due soon;  Dizziness, acute labyrinthitis resolved spont & hasn't recurred;  She remains fatigued/ tired/ weak in assoc w/ the stress she has been under trying to care for her elderly husb;  We plan  ROV recheck in 65mo 02/28/18>   VEritreais improved & back to caring for her husb 24/7 by her report;  She has a number of somatic complaints and is under a lot of stress; CXR & Labs look good, heart rhythm remains regular, we plan ROV recheck in 221mo PAF>  This occurred 12/2017 & noted on adm to hosp, she developed 2 pauses on Metoprolol, EP considered pacer, she converted to NSR on her own & was placed on Amio200 & Eliquis5Bid; f/u w/ [facility] is pending...   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  Thyroid>  Thyroid function is wnl, nodules are palp & Sonar 12/13 showed most nodules stable in size but dom nodule left lower pole is NEW & subseq left thyroid lobectomy surg by DrByers 1/14 (see above)...  Episode confusion/ HA, CT/MRI w/ small vessel dis=> Hosp eval 5/19 was NEG, thought to have an acute encephalopathy, developed acute labyrinthitis in the Hosp=>  all resolved in several days and she declined SNF- rehab & went home to care for husb...     Ortho- LBP, wrist, shoulder> she is managed by DrFields & is improved overall...  Osteop> BMD 6/09 at [place] -2.9 in Spine, and -2.1 in left [facility]; treated w/ Alendronate 32m/wk but pt stopped on her own using calcium, MVI, VitD; f/u BMD 10/14 showed lowest Tscore -2.1 & she wants to continue Fosamax70, calcium, MVI, VitD (level = 62)... BMD 11/16 IMPROVED & Alendronate stopped for 275yroneymoon period...  B12 Deficiency>  She is delighted to see that her B12 level is sl low as she wants B12 shots and knows that this will give her more energy... REC to start B12 [id number] monthly => f/u B12 level is >1500 on these shots.  Anxiety/  Depression> she was stable on Prozac40 & Xanax 0.5mg59m needed (taking one qhs); then she stopped the Prozac after reading something about potential side effects- offered Lexapro or Psyche referral for other med considerations but she declines...     Plan:     Patient's Medications  New Prescriptions   No medications on file  Previous Medications   ACETAMINOPHEN (TYLENOL) 325 MG TABLET    Take 325-650 mg by mouth every 6 (six) hours as needed for headache (pain).   ALPRAZOLAM (XANAX) 0.5 MG TABLET    TAKE 1/2 TO 1 (ONE-HALF TO ONE) TABLET BY MOUTH THREE TIMES DAILY AS NEEDED   AMIODARONE (PACERONE) 200 MG TABLET    Take 1 tablet (200 mg total) by mouth daily.   APIXABAN (ELIQUIS) 5 MG TABS TABLET    Take 1 tablet (5 mg total) by mouth 2 (two) times daily.   ASCORBIC ACID (VITAMIN C PO)    Take 1 tablet by mouth daily.   B COMPLEX VITAMINS TABLET    Take 1 tablet by mouth daily.   BACLOFEN (LIORESAL) 10 MG TABLET    Take 1 tablet (10 mg total) by mouth at bedtime.   CYANOCOBALAMIN (,VITAMIN B-12,) 1000 MCG/ML INJECTION    Inject 1,000 mcg into the muscle every 30 (thirty) days.   EZETIMIBE (ZETIA) 10 MG TABLET    Take 10 mg by mouth daily.   MULTIPLE VITAMIN (MULTIVITAMIN WITH MINERALS) TABS TABLET    Take 1 tablet by mouth daily. Centrum   OMEGA-3 FATTY ACIDS (FISH OIL PO)    Take 2 capsules by mouth daily.  Modified Medications   No medications on file  Discontinued Medications   No medications on file

## 2018-03-01 ENCOUNTER — Other Ambulatory Visit (INDEPENDENT_AMBULATORY_CARE_PROVIDER_SITE_OTHER): Payer: PPO

## 2018-03-01 ENCOUNTER — Ambulatory Visit (INDEPENDENT_AMBULATORY_CARE_PROVIDER_SITE_OTHER): Payer: PPO

## 2018-03-01 ENCOUNTER — Ambulatory Visit (INDEPENDENT_AMBULATORY_CARE_PROVIDER_SITE_OTHER)
Admission: RE | Admit: 2018-03-01 | Discharge: 2018-03-01 | Disposition: A | Discharge status: Home / Self Care | Payer: PPO | Source: Ambulatory Visit | Attending: Pulmonary Disease | Admitting: Pulmonary Disease

## 2018-03-01 DIAGNOSIS — E538 Deficiency of other specified B group vitamins: Secondary | ICD-10-CM

## 2018-03-01 DIAGNOSIS — Z8669 Personal history of other diseases of the nervous system and sense organs: Secondary | ICD-10-CM

## 2018-03-01 DIAGNOSIS — R0989 Other specified symptoms and signs involving the circulatory and respiratory systems: Secondary | ICD-10-CM | POA: Diagnosis not present

## 2018-03-01 LAB — COMPREHENSIVE METABOLIC PANEL
ALBUMIN: 3.8 g/dL (ref 3.5–5.2)
ALT: 17 U/L (ref 0–35)
AST: 18 U/L (ref 0–37)
Alkaline Phosphatase: 64 U/L (ref 39–117)
BUN: 19 mg/dL (ref 6–23)
CALCIUM: 9.4 mg/dL (ref 8.4–10.5)
CHLORIDE: 108 meq/L (ref 96–112)
CO2: 27 meq/L (ref 19–32)
Creatinine, Ser: 0.95 mg/dL (ref 0.40–1.20)
GFR: 61.21 mL/min (ref >60.00)
Glucose, Bld: 100 mg/dL — ABNORMAL HIGH (ref 70–99)
POTASSIUM: 3.9 meq/L (ref 3.5–5.1)
Sodium: 142 mEq/L (ref 135–145)
Total Bilirubin: 0.9 mg/dL (ref 0.2–1.2)
Total Protein: 6.4 g/dL (ref 6.0–8.3)

## 2018-03-01 LAB — CBC WITH DIFFERENTIAL/PLATELET
BASOS PCT: 1.4 % (ref 0.0–3.0)
Basophils Absolute: 0.1 10*3/uL (ref 0.0–0.1)
EOS ABS: 0.1 10*3/uL (ref 0.0–0.7)
EOS PCT: 1.8 % (ref 0.0–5.0)
HEMATOCRIT: 40.9 % (ref 36.0–46.0)
HEMOGLOBIN: 13.7 g/dL (ref 12.0–15.0)
Lymphocytes Relative: 39.3 % (ref 12.0–46.0)
Lymphs Abs: 2.5 10*3/uL (ref 0.7–4.0)
MCHC: 33.6 g/dL (ref 30.0–36.0)
MCV: 84.6 fl (ref 78.0–100.0)
MONO ABS: 0.4 10*3/uL (ref 0.1–1.0)
MONOS PCT: 6.1 % (ref 3.0–12.0)
NEUTROS ABS: 3.3 10*3/uL (ref 1.4–7.7)
Neutrophils Relative %: 51.4 % (ref 43.0–77.0)
PLATELETS: 188 10*3/uL (ref 150.0–400.0)
RBC: 4.84 Mil/uL (ref 3.87–5.11)
RDW: 15 % (ref 11.5–15.5)
WBC: 6.5 10*3/uL (ref 4.0–10.5)

## 2018-03-01 LAB — SEDIMENTATION RATE: Sed Rate: 23 mm/hr (ref 0–30)

## 2018-03-02 MED ORDER — CYANOCOBALAMIN 1000 MCG/ML IJ SOLN
1000.0000 ug | Freq: Once | INTRAMUSCULAR | Status: AC
  Administered 2018-03-01: 1000 ug via INTRAMUSCULAR

## 2018-03-16 ENCOUNTER — Telehealth: Payer: Self-pay | Admitting: Internal Medicine

## 2018-03-16 MED ORDER — APIXABAN 5 MG PO TABS: 5.0000 mg | ORAL_TABLET | Freq: Two times a day (BID) | ORAL | 3 refills | Status: DC

## 2018-03-16 MED ORDER — AMIODARONE HCL 200 MG PO TABS: 200.0000 mg | ORAL_TABLET | Freq: Every day | ORAL | 3 refills | Status: DC

## 2018-03-16 NOTE — Telephone Encounter (Signed)
New message   What medication is she suppose to be taking? She was released from hospital Saturday and there was no refills on the medication

## 2018-03-16 NOTE — Telephone Encounter (Signed)
Call returned to Pt.  Per Pt [place] would not fill prescriptions and would not request refills from Dr. Lovena Le.  Resent amiodarone and Eliquis refills to [facility] per request.  Advised Pt Dr. Lovena Le wants her to continue current medications until follow up in November.  Pt indicates understanding.

## 2018-03-28 ENCOUNTER — Telehealth: Payer: Self-pay | Admitting: Internal Medicine

## 2018-03-28 NOTE — Telephone Encounter (Signed)
New problem    Pt c/o medication issue:  1. Name of Medication: amiodarone   2. How are you currently taking this medication (dosage and times per day)? 200mg    3. Are you having a reaction (difficulty breathing--STAT)? No  4. What is your medication issue? Making her go bald

## 2018-03-28 NOTE — Telephone Encounter (Signed)
Ok to stop medications.

## 2018-03-28 NOTE — Telephone Encounter (Signed)
Spoke with patient who states she does not want to take Amiodarone any longer due to hair loss. She reports losing large amounts of hair and "going bald." Patient states she does not think she has had any episodes of a fib recently, states it usually makes her feel very tired and she has not noticed that.  Takes amiodarone 200 mg once daily in the morning and wants to take a different medication with less side effects. I advised that each medication has different side effects but that we will consult with Dr. Lovena Le for an alternative. I asked if she would come to the office for TSH and she states "my thyroid is fine." she has hx of left thyroidectomy and does not know date of last thyroid panel. She states it is bad enough that she has to take any medication and refuses to go bald in addition to having the PAF. I advised that I will forward message to Dr. Lovena Le and that someone from our office will call her back with his advice. I advised it may not be today when she receives a call back and she verbalized understanding and agreement with plan.  She agrees to continue amiodarone 200 mg QD until call back.

## 2018-03-29 ENCOUNTER — Other Ambulatory Visit: Payer: Self-pay | Admitting: *Deleted

## 2018-03-29 MED ORDER — APIXABAN 5 MG PO TABS: 5.0000 mg | ORAL_TABLET | Freq: Two times a day (BID) | ORAL | 3 refills | Status: DC

## 2018-03-29 NOTE — Telephone Encounter (Signed)
Left CVM per DPR.  Advised Pt to stop taking amiodarone per Dr. Lovena Le.  Will f/u with Dr. Lovena Le in regards to medication change.

## 2018-03-31 NOTE — Telephone Encounter (Signed)
Per Dr. Marge Duncans other medications needed at this time as the amiodarone washes out.   Per Dr. Rosalyn Charters level at next office visit in November.

## 2018-04-04 ENCOUNTER — Ambulatory Visit: Payer: PPO

## 2018-04-07 ENCOUNTER — Ambulatory Visit (INDEPENDENT_AMBULATORY_CARE_PROVIDER_SITE_OTHER): Payer: PPO

## 2018-04-07 DIAGNOSIS — E538 Deficiency of other specified B group vitamins: Secondary | ICD-10-CM

## 2018-04-08 MED ORDER — CYANOCOBALAMIN 1000 MCG/ML IJ SOLN
1000.0000 ug | Freq: Once | INTRAMUSCULAR | Status: AC
  Administered 2018-04-07: 1000 ug via INTRAMUSCULAR

## 2018-04-08 NOTE — Progress Notes (Signed)
Documentation of medication administration and charges of Vitamin B12 have been completed by Desmond Dike, CMA based on the hand written Vitamin B12 documentation sheet completed by Alroy Bailiff, who administered the medication.

## 2018-04-12 ENCOUNTER — Encounter: Payer: Self-pay | Admitting: Pulmonary Disease

## 2018-04-12 ENCOUNTER — Ambulatory Visit: Payer: PPO | Admitting: Pulmonary Disease

## 2018-04-12 VITALS — BP 120/68 | HR 70 | Temp 98.1°F | Ht 65.5 in | Wt 174.6 lb

## 2018-04-12 DIAGNOSIS — N814 Uterovaginal prolapse, unspecified: Secondary | ICD-10-CM

## 2018-04-12 DIAGNOSIS — Z8669 Personal history of other diseases of the nervous system and sense organs: Secondary | ICD-10-CM

## 2018-04-12 DIAGNOSIS — E538 Deficiency of other specified B group vitamins: Secondary | ICD-10-CM | POA: Diagnosis not present

## 2018-04-12 DIAGNOSIS — E042 Nontoxic multinodular goiter: Secondary | ICD-10-CM | POA: Diagnosis not present

## 2018-04-12 DIAGNOSIS — F341 Dysthymic disorder: Secondary | ICD-10-CM

## 2018-04-12 DIAGNOSIS — E785 Hyperlipidemia, unspecified: Secondary | ICD-10-CM

## 2018-04-12 DIAGNOSIS — M150 Primary generalized (osteo)arthritis: Secondary | ICD-10-CM

## 2018-04-12 DIAGNOSIS — M159 Polyosteoarthritis, unspecified: Secondary | ICD-10-CM

## 2018-04-12 DIAGNOSIS — R5383 Other fatigue: Secondary | ICD-10-CM

## 2018-04-12 DIAGNOSIS — Z636 Dependent relative needing care at home: Secondary | ICD-10-CM

## 2018-04-12 MED ORDER — TIZANIDINE HCL 4 MG PO CAPS: 4.0000 mg | ORAL_CAPSULE | Freq: Every day | ORAL | 0 refills | Status: DC

## 2018-04-12 MED ORDER — DONEPEZIL HCL 10 MG PO TABS: 10.0000 mg | ORAL_TABLET | Freq: Every day | ORAL | 0 refills | Status: DC

## 2018-04-12 MED ORDER — ACYCLOVIR 400 MG PO TABS: 400.0000 mg | ORAL_TABLET | Freq: Four times a day (QID) | ORAL | 2 refills | Status: DC | PRN

## 2018-04-12 NOTE — Progress Notes (Addendum)
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 feels this will help her sleep;  We plan ROV recheck to assess response in 218mo.           Current Problems:   HYPERCHOLESTEROLEMIA (ICD-272.0) - on diet, exercise & OTC meds;  she prev refused to consider prescription drug therapy for this problem... ~  FL[place]/12 showed TChol 242, TG 142, HDL 56, LDL 157... she refuses med rx. ~  [place] on diet alone showed TChol 242, TG 162, HDL 43, LDL 169... She has agreed to try CRESTOR '10mg'$ /d => she never started this med. ~  [place] on diet alone showed TChol 226, TG 106, HDL 40, LDL 165... Rec to start ATORVA20, she will decide (refused statin, she'll try Zetia). ~  [place]/16 on Zetia10 showed TChol 159, TG 143, HDL 40, LDL 90... Continue same. ~  [place]1/16 on Zetia10  showed TChol 163, TG 179, HDL 45, LDL 82...  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 we do not have the details of the prev eval (?by ENT Carly Rhodes?)... TFT's here were normal--- looks like she had a thyroid bx by DrYamagata ordered by DrDickstein 6/09= c/w hyperplastic nodule/ non-neoplastic goiter (this bx was difficult & painful she recalls)... ~  labs 3/08 showed TSH = 0.47 ~  labs 3/09 showed  TSH = 0.55 ~  Thyroid Sonar 5/09 showed diffusely abn thyroid c/w multinod gland & bilat biopsies done 3/15 revealing follicular cells/ non-neoplastic goiter... ~  labs 3/10 showed TSH= 0.43 ~  labs 3/12 showed TSH= 0.23=> she never ret for f/u labs as requested. ~  Labs 12/13 showed TSH=0.44, FreeT3=2.6 (2.3-4.2), FreeT4=0.86 (0.60-1.60)... ~  f/u Thyroid Sonar 12/13 showed multinod gland w/ most nodules unchanged; but NEW 3.4cm dominant left lower pole nodule & Bx is rec... ~  Thyroid surg 1/14 by Carly Rhodes- left thyroidectomy for a "mass" and path revealed BENIGN MULTINODULAR THYROID WITH HYPERPLASTIC NODULES AND FOCALLY HYALINIZED AND CALCIFIED PARENCHYMA, NEGATIVE FOR ATYPIA OR MALIGNANCY... ~  Labs 10/15 showed TSH= 0.75 ~  Labs 11/16 showed TSH= 0.74  COLONOSCOPY >> she had a routine screening colonoscopy 2/13 by DrDBrodie- it was normal, no polyps or lesions, no divertics etc... F/u planned 19yr.  GALLSTONES >> multiple small gallstones were noted on a CXR 9/13... Hx of HEPATITIS A >> age 72 no known residual problems... ~  LFTs have remained WNL...  GYN >> she was followed by DrDickstein; now in need of another Gyn & f/u for PAP, Mammogram (she promises to call & set up this important screening eval)...   [facility] & 2 anterior left ribs >>  MULT ORTHOPEDIC ISSUES >> all attended by Carly Rhodes eHarriett Rhodes.. Hx of LOW BACK PAIN SYNDROME (ICD-724.2) - prev evals from chiropractor and Carly Rhodes for sports medicine...   OSTEOPOROSIS >> she was on & off Alendronate'70mg'$ /wk... ~  BMD 2009 reported from GYN showed lowest Tscore = -2.9..Marland KitchenMarland KitchenShe initially refused Bisphos therapy & later took it intermittently... ~  BMD 10/14 showed lowest Tscore = -2.1 in Spine (improved from Gyn check in 2009); Rec to continue Calcium, MVI, VitD supplements and wt bearing exercise, she decided to restart Alendronate70/wk. ~  Labs 10/15 showed VitD level = 62 ~  Labs 11/16 showed Vit D = 52 ~  BMD  done 07/07/15, off Alendronate (stopped on her own)>  Tscores -1.7 in Lspine (improved 1.2%) & -1.5 in Bilat FemNecks, improved from prev & OK to leave off Alendronate x215yrand repeat BMD in 2018...  DYSTHYMIA (ICD-300.4) - prev taking Xanax 0.'5mg'$  Qhs Prn and Fluoxetine '40mg'$ /d... she states "I've had a melt-down & left my husb after 448yrf marriage"... she is seeing Dr. ClaMarden Nobleychologist for counselling help "he says it's grief"... she is requesting to change to her sister's medication = EffexorXR, instead of Lexapro... ~  2009: we accommodated her requests- tried Lexapro- too $$$, Effexor- caused nightmares, then Zoloft, and now on generic Prozac '40mg'$ /d...  ~  11/10: she remains on Prozac'40mg'$ /d & wishes to continue Rx as she feels this is continuing to help... ~  3/12:  ditto- continues on Prozac40 & Alpraz 0.'5mg'$  Prn & requests refills... ~  12/13:  She is off the prev Xanax & Prozac- apparently she is seeing a Psychiatrist but she couldn't tell me who or what med they changed her to... ~  5/14: She had f/u 5/14 w/ TP for anxiety & depression> she stopped her Lexapro (wasn't helping) & requested to go back on Prozac40; getting counselling which she feels is helpful; notes family stress & rec to continue talking/counseling ~  10/15: she has remained on Prozac40 & Xanax0.5 prn (taking 1Qhs) and stable; she requests to continue the same meds...   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 she went to plastic surg at [facility] w/ lipoma excision and removal of dystrophic right great toenail by DrMarks in 2009.  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  HEALTH MAINTENANCE:   ~  GI:  she states that she had a colonoscopy in [facility] in 2007... we do not have this report....  ~  GYN:  routine GYN- saw DrDickstein in 2009... she has her Mammograms at the [facility] (last 8/13=neg),  but hasn't had a BMD- "they don't work, it was in the news"--- finally had BMD 6/09 by Erling Conte GYN= TScores -2.9 in spine,  & -2.1 in left [facility] w/ rec for Rx by DrDickstein on ALENDRONATE... Vit D level 3/10= 41...  ~  IMMUNIZ:  Given Pneumovax 10/14... Had TDAP 2011 in hosp ER... Gets yearly Flu vaccine   Past Surgical History:  Procedure Laterality Date  . ABDOMINAL HYSTERECTOMY  1984  . APPENDECTOMY  1984   at time of Hysterectomy  . HAMMER TOE SURGERY  1993   2nd toe left foot  . THYROID LOBECTOMY  09/15/2012   Procedure: THYROID LOBECTOMY;  Surgeon: Melissa Montane, MD;  Location: [place];  Service: ENT;  Laterality: Left;  . THYROIDECTOMY, PARTIAL  09/15/2012   Dr Janace Hoard  . TONSILLECTOMY  1957    Outpatient Encounter Medications as of 04/12/2018  Medication Sig  . acetaminophen (TYLENOL) 325 MG tablet Take 325-650 mg by mouth every 6 (six) hours as needed for headache (pain).  Marland Kitchen ALPRAZolam (XANAX) 0.5 MG tablet TAKE 1/2 TO 1 (ONE-HALF TO ONE) TABLET BY MOUTH THREE TIMES DAILY AS NEEDED  . apixaban (ELIQUIS) 5 MG TABS tablet Take 1 tablet (5 mg total) by mouth 2 (two) times daily.  . Ascorbic Acid (VITAMIN C PO) Take 1 tablet by mouth daily.  Marland Kitchen b complex vitamins tablet Take 1 tablet by mouth daily.  . cyanocobalamin (,VITAMIN B-12,) 1000 MCG/ML injection Inject 1,000 mcg into the muscle every 30 (thirty) days.  Marland Kitchen ezetimibe (ZETIA) 10 MG tablet Take 10 mg by mouth daily.  . Multiple Vitamin (MULTIVITAMIN WITH MINERALS) TABS tablet Take 1 tablet by mouth daily. Centrum  . Omega-3 Fatty Acids (FISH OIL PO) Take 2 capsules by mouth daily.  . [DISCONTINUED] baclofen (LIORESAL) 10 MG tablet Take 1 tablet (10 mg total) by mouth at bedtime.  Marland Kitchen acyclovir (ZOVIRAX) 400 MG tablet Take 1 tablet (400 mg total) by mouth every 6 (six) hours as needed.  . donepezil (ARICEPT) 10 MG tablet Take 1 tablet (10 mg total) by mouth at bedtime.  Marland Kitchen tiZANidine (ZANAFLEX) 4 MG capsule Take 1 capsule (4 mg total)  by mouth at bedtime.   No facility-administered encounter medications on file as of 04/12/2018.     Allergies  Allergen Reactions  . Procaine Other (See Comments)    REACTION:  Lowers blood pressure, "makes me loopy, can't move or talk"    Immunization History  Administered Date(s) Administered  . Influenza Split 05/20/2011, 06/23/2012  .  Influenza Whole 07/15/2009, 05/22/2010  . Influenza, High Dose Seasonal PF 06/17/2016, 06/15/2017  . Influenza,inj,Quad PF,6+ Mos 06/30/2013, 07/02/2014, 07/01/2015  . Pneumococcal Polysaccharide-23 05/31/2013  . Tdap 01/16/2015    Current Medications, Allergies, Past Medical History, Past Surgical History, Family History, and Social History were reviewed in [employer] record.   Review of Systems        The patient complains of recent URI symproms- cough/ congestion, sore throat + gas/bloating, back pain, stiffness, arthritis, depression.  The patient denies fever, chills, sweats, anorexia, fatigue, weakness, malaise, weight loss, sleep disorder, blurring, diplopia, eye irritation, eye discharge, vision loss, eye pain, photophobia, earache, ear discharge, tinnitus, decreased hearing, nasal congestion, nosebleeds, sore throat, hoarseness, chest pain, palpitations, syncope, dyspnea on exertion, orthopnea, PND, peripheral edema, cough, dyspnea at rest, excessive sputum, hemoptysis, wheezing, pleurisy, nausea, vomiting, diarrhea, constipation, change in bowel habits, abdominal pain, melena, hematochezia, jaundice, indigestion/heartburn, dysphagia, odynophagia, dysuria, hematuria, urinary frequency, urinary hesitancy, nocturia, incontinence, joint pain, joint swelling, muscle cramps, muscle weakness, sciatica, restless legs, leg pain at night, leg pain with exertion, rash, itching, dryness, suspicious lesions, paralysis, paresthesias, seizures, tremors, vertigo, transient blindness, frequent falls, frequent headaches, difficulty walking,  anxiety, memory loss, confusion, cold intolerance, heat intolerance, polydipsia, polyphagia, polyuria, unusual weight change, abnormal bruising, bleeding, enlarged lymph nodes, urticaria, allergic rash, and recurrent infections.     Objective:   Physical Exam     WD, WN, 72 y/o WF in NAD... GENERAL:  Alert & oriented; pleasant & cooperative... HEENT:  /AT, EOM-full, PERRLA, EACs-clear, TMs-normal, NOSE-sl pale, THROAT- sl erythema w/o exud... NECK:  Supple w/ fairROM; no JVD; normal carotid impulses w/o bruits; +thyromegaly w/ coarse bilat nodularity; no lymphadenopathy. CHEST:  Coarse BS w/ no wheezes, rales, or signs of consolidation. HEART:  Regular Rhythm; without murmurs/ rubs/ or gallops. ABDOMEN:  Soft & nontender; incr bowel sounds; no organomegaly or masses detected. EXT: without deformities or arthritic changes; no varicose veins/ venous insuffic/ or edema. NEURO:  no focal neuro deficits... DERM: no skin lesions or rash identified...  RADIOLOGY DATA:  Reviewed in the EPIC EMR & discussed w/ the patient...  LABORATORY DATA:  Reviewed in the EPIC EMR & discussed w/ the patient...   Assessment:      ACUTE ADM 5/27 - 01/15/18 for confusion, HA, AFib w/ rvr, acute labyrinthitis>  Hosp records reviewed in detail- confusion/encephalopathy resolved, dizziness resolved, AFib converted to NSR spont...  01/27/18>   As above- no etiology for the fever, HA, confusion was ascertained- called an acute encephalopathy & resolved spont (in assoc w/ treatment as described);  PAF converted spont & she remains on Amio200 & Eliquis5Bid for now- f/u w/ CARDS-EP due soon;  Dizziness, acute labyrinthitis resolved spont & hasn't recurred;  She remains fatigued/ tired/ weak in assoc w/ the stress she has been under trying to care for her elderly husb;  We plan ROV recheck in 80mo 02/28/18>   VEritreais improved & back to caring for her husb 24/7 by her report;  She has a number of somatic complaints and is  under a lot of stress; CXR & Labs look good, heart rhythm remains regular, we plan ROV recheck in 27mo/27/19>   We accomodated her request for Aricept trial and switched her Lioresal to Zanaflex'4mg'$  Qhs as she feels this will help her sleep;  We plan ROV recheck to assess response in 70m74mo   PAF>  This occurred 12/2017 & noted on adm to hosp, she developed 2 pauses  on Metoprolol, EP considered pacer, she converted to NSR on her own & was placed on Amio200 & Eliquis5Bid; f/u w/ CARDS-EP is pending...   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  Thyroid>  Thyroid function is wnl, nodules are palp & Sonar 12/13 showed most nodules stable in size but dom nodule left lower pole is NEW & subseq left thyroid lobectomy surg by Carly Rhodes 1/14 (see above)...  Episode confusion/ HA, CT/MRI w/ small vessel dis=> Hosp eval 5/19 was NEG, thought to have an acute encephalopathy, developed acute labyrinthitis in the Hosp=> all resolved in several days and she declined SNF- rehab & went home to care for husb...     Ortho- LBP, wrist, shoulder> she is managed by Carly Rhodes & is improved overall...  Osteop> BMD 6/09 at [facility] -2.9 in Spine, and -2.1 in left [facility]; treated w/ Alendronate '70mg'$ /wk but pt stopped on her own using calcium, MVI, VitD; f/u BMD 10/14 showed lowest Tscore -2.1 & she wants to continue Fosamax70, calcium, MVI, VitD (level = 62)... BMD 11/16 IMPROVED & Alendronate stopped for 50yrhoneymoon period...  B12 Deficiency>  She is delighted to see that her B12 level is sl low as she wants B12 shots and knows that this will give her more energy... REC to start B12 10078m shot monthly => f/u B12 level is  >1500 on these shots.  Anxiety/ Depression> she was stable on Prozac40 & Xanax 0.'5mg'$  as needed (taking one qhs); then she stopped the Prozac after reading something about potential side effects- offered Lexapro or Psyche referral for other med considerations but she declines...     Plan:     Patient's Medications  New Prescriptions   ACYCLOVIR (ZOVIRAX) 400 MG TABLET    Take 1 tablet (400 mg total) by mouth every 6 (six) hours as needed.   DONEPEZIL (ARICEPT) 10 MG TABLET    Take 1 tablet (10 mg total) by mouth at bedtime.   TIZANIDINE (ZANAFLEX) 4 MG CAPSULE    Take 1 capsule (4 mg total) by mouth at bedtime.  Previous Medications   ACETAMINOPHEN (TYLENOL) 325 MG TABLET    Take 325-650 mg by mouth every 6 (six) hours as needed for headache (pain).   ALPRAZOLAM (XANAX) 0.5 MG TABLET    TAKE 1/2 TO 1 (ONE-HALF TO ONE) TABLET BY MOUTH THREE TIMES DAILY AS NEEDED   APIXABAN (ELIQUIS) 5 MG TABS TABLET    Take 1 tablet (5 mg total) by mouth 2 (two) times daily.   ASCORBIC ACID (VITAMIN C PO)    Take 1 tablet by mouth daily.   B COMPLEX VITAMINS TABLET    Take 1 tablet by mouth daily.   CYANOCOBALAMIN (,VITAMIN B-12,) 1000 MCG/ML INJECTION    Inject 1,000 mcg into the muscle every 30 (thirty) days.   EZETIMIBE (ZETIA) 10 MG TABLET    Take 10 mg by mouth daily.   MULTIPLE VITAMIN (MULTIVITAMIN WITH MINERALS) TABS TABLET    Take 1 tablet by mouth daily. Centrum   OMEGA-3 FATTY ACIDS (FISH OIL PO)    Take 2 capsules by mouth daily.  Modified Medications   No medications on file  Discontinued Medications   BACLOFEN (LIORESAL) 10 MG TABLET    Take 1 tablet (10 mg total) by mouth at bedtime.

## 2018-04-12 NOTE — Patient Instructions (Addendum)
Today we updated your med list in our EPIC system...     We decided to try you on Aricept (Donepizil) 10 mg take one tab daily for your memory...  We also discussed changing the Baclofen to ZANAFLEX (generic= Tizanidine) 4mg  tabs take one tab at bedtime to help w/ sleep...  Call for any questions...  Let's plan a follow up visit in 96mo, sooner if needed for problems.Marland KitchenMarland Kitchen

## 2018-05-10 ENCOUNTER — Ambulatory Visit (INDEPENDENT_AMBULATORY_CARE_PROVIDER_SITE_OTHER): Payer: PPO

## 2018-05-10 DIAGNOSIS — E538 Deficiency of other specified B group vitamins: Secondary | ICD-10-CM | POA: Diagnosis not present

## 2018-05-10 MED ORDER — CYANOCOBALAMIN 1000 MCG/ML IJ SOLN
1000.0000 ug | Freq: Once | INTRAMUSCULAR | Status: AC
  Administered 2018-05-10: 1000 ug via INTRAMUSCULAR

## 2018-05-10 NOTE — Progress Notes (Signed)
Documentation of medication administration and charges of Vitamin B12 have been completed by Desmond Dike, CMA based on the hand written Vitamin B12 documentation sheet completed by Alroy Bailiff, who administered the medication.

## 2018-05-17 ENCOUNTER — Other Ambulatory Visit (INDEPENDENT_AMBULATORY_CARE_PROVIDER_SITE_OTHER): Payer: PPO

## 2018-05-17 ENCOUNTER — Encounter: Payer: Self-pay | Admitting: Pulmonary Disease

## 2018-05-17 ENCOUNTER — Ambulatory Visit: Payer: PPO | Admitting: Pulmonary Disease

## 2018-05-17 VITALS — BP 116/66 | HR 76 | Temp 98.1°F | Ht 65.5 in | Wt 171.6 lb

## 2018-05-17 DIAGNOSIS — E042 Nontoxic multinodular goiter: Secondary | ICD-10-CM

## 2018-05-17 DIAGNOSIS — F341 Dysthymic disorder: Secondary | ICD-10-CM

## 2018-05-17 DIAGNOSIS — Z636 Dependent relative needing care at home: Secondary | ICD-10-CM | POA: Diagnosis not present

## 2018-05-17 DIAGNOSIS — E785 Hyperlipidemia, unspecified: Secondary | ICD-10-CM

## 2018-05-17 DIAGNOSIS — N814 Uterovaginal prolapse, unspecified: Secondary | ICD-10-CM | POA: Diagnosis not present

## 2018-05-17 DIAGNOSIS — E538 Deficiency of other specified B group vitamins: Secondary | ICD-10-CM

## 2018-05-17 DIAGNOSIS — M159 Polyosteoarthritis, unspecified: Secondary | ICD-10-CM

## 2018-05-17 DIAGNOSIS — R5383 Other fatigue: Secondary | ICD-10-CM | POA: Diagnosis not present

## 2018-05-17 DIAGNOSIS — M150 Primary generalized (osteo)arthritis: Secondary | ICD-10-CM | POA: Diagnosis not present

## 2018-05-17 DIAGNOSIS — Z8669 Personal history of other diseases of the nervous system and sense organs: Secondary | ICD-10-CM

## 2018-05-17 LAB — IRON: IRON: 67 ug/dL (ref 42–145)

## 2018-05-17 LAB — COMPREHENSIVE METABOLIC PANEL
ALBUMIN: 4.3 g/dL (ref 3.5–5.2)
ALK PHOS: 73 U/L (ref 39–117)
ALT: 17 U/L (ref 0–35)
AST: 21 U/L (ref 0–37)
BILIRUBIN TOTAL: 0.7 mg/dL (ref 0.2–1.2)
BUN: 20 mg/dL (ref 6–23)
CALCIUM: 9.5 mg/dL (ref 8.4–10.5)
CO2: 28 meq/L (ref 19–32)
Chloride: 107 mEq/L (ref 96–112)
Creatinine, Ser: 0.84 mg/dL (ref 0.40–1.20)
GFR: 70.51 mL/min (ref >60.00)
Glucose, Bld: 100 mg/dL — ABNORMAL HIGH (ref 70–99)
Potassium: 4.3 mEq/L (ref 3.5–5.1)
Sodium: 141 mEq/L (ref 135–145)
Total Protein: 6.8 g/dL (ref 6.0–8.3)

## 2018-05-17 LAB — CBC WITH DIFFERENTIAL/PLATELET
Basophils Absolute: 0.1 10*3/uL (ref 0.0–0.1)
Basophils Relative: 1 % (ref 0.0–3.0)
Eosinophils Absolute: 0.1 10*3/uL (ref 0.0–0.7)
Eosinophils Relative: 1.2 % (ref 0.0–5.0)
HCT: 41.8 % (ref 36.0–46.0)
Hemoglobin: 14.2 g/dL (ref 12.0–15.0)
LYMPHS ABS: 2.8 10*3/uL (ref 0.7–4.0)
Lymphocytes Relative: 39.4 % (ref 12.0–46.0)
MCHC: 33.9 g/dL (ref 30.0–36.0)
MCV: 83.8 fl (ref 78.0–100.0)
MONOS PCT: 7.1 % (ref 3.0–12.0)
Monocytes Absolute: 0.5 10*3/uL (ref 0.1–1.0)
NEUTROS ABS: 3.7 10*3/uL (ref 1.4–7.7)
NEUTROS PCT: 51.3 % (ref 43.0–77.0)
Platelets: 200 10*3/uL (ref 150.0–400.0)
RBC: 4.99 Mil/uL (ref 3.87–5.11)
RDW: 13.8 % (ref 11.5–15.5)
WBC: 7.2 10*3/uL (ref 4.0–10.5)

## 2018-05-17 LAB — IBC PANEL
Iron: 67 ug/dL (ref 42–145)
Saturation Ratios: 16.5 % — ABNORMAL LOW (ref 20.0–50.0)
Transferrin: 290 mg/dL (ref 212.0–360.0)

## 2018-05-17 LAB — SEDIMENTATION RATE: SED RATE: 32 mm/h — AB (ref 0–30)

## 2018-05-17 LAB — TSH: TSH: 0.24 u[IU]/mL — ABNORMAL LOW (ref 0.35–4.50)

## 2018-05-17 LAB — FERRITIN: FERRITIN: 41.9 ng/mL (ref 10.0–291.0)

## 2018-05-17 MED ORDER — BACLOFEN 10 MG PO TABS: 10.0000 mg | ORAL_TABLET | Freq: Every day | ORAL | 5 refills | Status: DC

## 2018-05-17 NOTE — Patient Instructions (Signed)
Today we updated your med list in our EPIC system...    Continue your current medications the same...  Today we rechecked your blood work to see if there are any clues to your fatigue...    We will contact you w/ the results when available...   It would be helpful for you to get some additional help at home caring for your husband!    This will decrease your stress level...  Call for any questions or if we can be of service in any way.Marland KitchenMarland Kitchen

## 2018-05-17 NOTE — Progress Notes (Addendum)
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 response in 274mo.   ~  May 17, 2018:  20m38moV & add-on appt requested for ?? > she is c/o fatigue "I'm not tired, I am worse than tired" "I'm fatigued, I'm dying" and states that rest doesn't help; she has not let the care givers help w/ the hard work of caring for her elderly husb "He needs all this personal care, required of me";  She is c/o balance off "I know it's related to then [id number]"...  Last visit we wrote for Aricept at her request for memory but she tells me that she only took 3 pills then got scared and stopped;  We also switched her Baclofen10 to Zanaflex4 but she says the Baclofen worked better & she wants back on this "It helps my sleep better"  she says... She is talking rapidly & non-stop about her situation, she has Xanax 0.'5mg'$  & supposed to take 1/2 to 1 tab Tid for her nerves and stress of care giving but she will not tell me how much she is actually taking... We reviewed the medical problem list outlined in the 04/12/18 office visit note>      PAF> she is on Eliquis5Bid, rhythm remains regular, she is followed by DrTaylor w/ up-coming appt to discuss if she needs to stay on the  blood thinner...    HL>  On Zetia10, last FLP w/ chol at goals but TG was elev & she is reminded to come FASTING for f/u lipid profile...     Weight> wt is stable in the 170's now...    Thyroid> hx multinod gland, she had left thyroidectomy 2014 by DrByers (neg for malignancy); TSH has drifted down since then:  8/18 TSH=0.84,  5/19 TSH=0.37,  10/19 TSH=0.24 (FreeT3 & FreeT4 are wnl- c/w subclinical hyperthy); we will check Sonar & refer to Endocrine...    Ortho> mult complaints managed by DrFields...    Hx Osteoporosis> Tscore -2.9 in Lspine 2009 by GYN- treated w/ alendronate70/wk but pt stopped on her own, f/u BMD improved into the osteopenic range & she remains on a drug holiday...    Anxiety/ depression/ insomnia, concern for memory loss/ etc>  Prev followed by Psyche but not in several yrs; currently has Xanax 0.'5mg'$  but not sure how she is taking it ( acheck of the PMP Aware website indicates she is getting ~90 tabs every month, no early refills etc)... EXAM shows Afeb, VSS w/ BP=116/66- no postural change, O2sat=98% on RA;  Wt= 172#;  HEENT- neg, mallampati1;  Neck- supple, scar of thy surg, ?palp thy nodule on right;  Chest- clear w/o w/r/r;  Heart- RR w/o m/r/g;   Abd- soft, neg;  Ext- VI w/o c/c/e.   LABS 05/17/18>   Chems- wnl;  CBC- ok w/ Hg=14.2, WBC=7.2, mcv=84, Fe=67 (17%sat), Ferritin=42;  Sed=32;  TSH=0.24  Repeat TFTs 05/19/18>  TSH=0.22,  FreeT3=3.2 (2.3-4.2),  FreeT4=1.11 (0.60-1.60)  Thyroid Ultrasound 05/27/18>  Parenchymal echotexture is markedly heterogenous,  Unchanged right lobe nodule measures 62m & unchanged (does not meet criteria for bx)...  IMP/PLAN>>  VVermontpersists w/ mult somatic complaints, she is under a lot of stress caring for her elderly husb, she is taking Xanax ~0.'5mg'$  Tid, she is asked to accept the help of care givers to lighten her load and decr her stress level;  She also appears to have subclinical hyperthyroidism w/ low TSH and normal FreeT3 & FreeT4, no  dominantnodules on ultrasound (she is s/p left thyroid lobectomy 2014 as noted)=> we will refer to LReardanfor their review & to follow up going forward, she also sees DrTaylor for CARDS... We discussed my approaching retirement & [facility]            Current Problems:   HYPERCHOLESTEROLEMIA (ICD-272.0) - on diet, exercise & OTC meds;  she prev refused to consider prescription drug therapy for this problem... ~  FUpland3/12 showed TChol 242, TG 142, HDL 56, LDL 157... she refuses med rx. ~  FBellflower12/13 on diet alone showed TChol 242, TG 162, HDL 43, LDL 169... She has agreed to try CRESTOR '10mg'$ /d => she never started this med. ~  FPecos10/15 on diet alone showed TChol 226, TG 106, HDL 40, LDL 165... Rec to start AGlassboro  she will decide (refused statin, she'll try Zetia). ~  [place] 5/16 on Zetia10 showed TChol 159, TG 143, HDL 40, LDL 90... Continue same. ~  Kennan 11/16 on Zetia10 showed TChol 163, TG 179, HDL 45, LDL 82...  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 we do not have the details of the prev eval (?by ENT DrByers?)... TFT's here were normal--- looks like she had a thyroid bx by DrYamagata ordered by DrDickstein 6/09= c/w hyperplastic nodule/ non-neoplastic goiter (this bx was difficult & painful she recalls)... ~  labs 3/08 showed TSH = 0.47 ~  labs 3/09 showed TSH = 0.55 ~  Thyroid Sonar 5/09 showed diffusely abn thyroid c/w multinod gland & bilat biopsies done 0/73 revealing follicular cells/ non-neoplastic goiter... ~  labs 3/10 showed TSH= 0.43 ~  labs 3/12 showed TSH= 0.23=> she never ret for f/u labs as requested. ~  Labs 12/13 showed TSH=0.44, FreeT3=2.6 (2.3-4.2), FreeT4=0.86 (0.60-1.60)... ~  f/u Thyroid Sonar 12/13 showed multinod gland w/ most nodules unchanged; but NEW 3.4cm dominant left lower pole nodule & Bx is rec... ~  Thyroid surg 1/14 by DrByers- left  thyroidectomy for a "mass" and path revealed BENIGN MULTINODULAR THYROID WITH HYPERPLASTIC NODULES AND FOCALLY HYALINIZED AND CALCIFIED PARENCHYMA, NEGATIVE FOR ATYPIA OR MALIGNANCY... ~  Labs 10/15 showed TSH= 0.75 ~  Labs 11/16 showed TSH= 0.74  COLONOSCOPY >> she had a routine screening colonoscopy 2/13 by DrDBrodie- it was normal, no polyps or lesions, no divertics etc... F/u planned 28yr.  GALLSTONES >> multiple small gallstones were noted on a CXR 9/13... Hx of HEPATITIS A >> age 72 no known residual problems... ~  LFTs have remained WNL...  GYN >> she was followed by DrDickstein; now in need of another Gyn & f/u for PAP, Mammogram (she promises to call & set up this important screening eval)...   [facility] 2 [facility] >>  MULT ORTHOPEDIC ISSUES >> all attended by DrFields eHarriett Sine.. Hx of LOW BACK PAIN SYNDROME (ICD-724.2) - prev evals from chiropractor and DrFields for sports medicine...   OSTEOPOROSIS >> she was on & off Alendronate'70mg'$ /wk... ~  BMD 2009 reported from GYN showed lowest Tscore = -2.9..Marland KitchenMarland KitchenShe initially refused Bisphos therapy & later took it intermittently... ~  BMD 10/14 showed lowest Tscore = -2.1 in Spine (improved from Gyn check in 2009); Rec to continue Calcium, MVI, VitD supplements and wt bearing exercise, she decided to restart Alendronate70/wk. ~  Labs 10/15 showed VitD level = 62 ~  Labs 11/16 showed Vit D = 52 ~  BMD done 07/07/15, off Alendronate (stopped on her own)>  Tscores -1.7 in Lspine (improved 1.2%) & -1.5 in Bilat FemNecks, improved from prev & OK to leave off Alendronate x267yrand repeat BMD in 2018...  DYSTHYMIA (ICD-300.4) - prev taking Xanax 0.'5mg'$  Qhs Prn and Fluoxetine '40mg'$ /d... she states "I've had a melt-down & left my husb after 4436yrf marriage"... she is seeing Dr. ClaMarden Nobleychologist for counselling help "he says it's grief"... she is requesting to change to her sister's medication = EffexorXR, instead  of Lexapro... ~  2009: we accommodated her requests- tried Lexapro- too $$$, Effexor- caused nightmares, then Zoloft, and now on generic Prozac '40mg'$ /d...  ~  11/10: she remains on Prozac'40mg'$ /d & wishes to continue Rx as she feels this is continuing to help... ~  3/12:  ditto- continues on Prozac40 & Alpraz 0.'5mg'$  Prn & requests refills... ~  12/13:  She is off the prev Xanax & Prozac- apparently she is seeing a Psychiatrist but she couldn't tell me who or what med they changed her to... ~  5/14: She had f/u 5/14 w/ TP for anxiety & depression> she stopped her Lexapro (wasn't helping) & requested to go back on Prozac40; getting counselling which she feels is helpful; notes family stress & rec to continue talking/counseling ~  10/15: she has remained on Prozac40 & Xanax0.5 prn (taking 1Qhs) and stable; she requests to continue the same meds...   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 she went to plastic surg at [facility] w/ lipoma excision and removal of dystrophic right great toenail by DrMarks in 2009.  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  HEALTH MAINTENANCE:   ~  GI:  she states that she had a colonoscopy in [facility] in 2007... we do not have this report....  ~  GYN:  routine GYN- saw DrDickstein in 2009... she has her Mammograms at the [facility] (last 8/13=neg), but hasn't had a BMD- "they don't work, it was in the news"--- finally had BMD 6/09 by Erling Conte GYN= TScores -2.9 in spine,  & -2.1 in left [facility] w/ rec for Rx by DrDickstein on ALENDRONATE... Vit D level 3/10= 41...  ~  IMMUNIZ:  Given Pneumovax 10/14... Had TDAP 2011 in hosp ER... Gets yearly Flu vaccine   Past Surgical History:  Procedure Laterality Date  . ABDOMINAL HYSTERECTOMY  1984  . APPENDECTOMY  1984   at time of Hysterectomy  . HAMMER TOE SURGERY  1993   2nd toe left foot  . THYROID LOBECTOMY   09/15/2012   Procedure: THYROID LOBECTOMY;  Surgeon: Melissa Montane, MD;  Location: [facility];  Service: ENT;  Laterality: Left;  . THYROIDECTOMY, PARTIAL  09/15/2012   Dr Janace Hoard  . TONSILLECTOMY  1957    Outpatient Encounter Medications as of 05/17/2018  Medication Sig  . acetaminophen (TYLENOL) 325 MG tablet Take 325-650 mg by mouth every 6 (six) hours as needed for headache (pain).  Marland Kitchen acyclovir (ZOVIRAX) 400 MG tablet Take 1 tablet (400 mg total) by mouth every 6 (six) hours as needed.  . ALPRAZolam (XANAX) 0.5 MG tablet TAKE 1/2 TO 1 (ONE-HALF TO ONE) TABLET BY MOUTH THREE TIMES DAILY AS NEEDED  . apixaban (ELIQUIS) 5 MG TABS tablet Take 1 tablet (5 mg total) by mouth 2 (two) times daily.  . Ascorbic Acid (VITAMIN C PO) Take 1 tablet by mouth daily.  Marland Kitchen b complex vitamins tablet Take 1 tablet by mouth daily.  . cyanocobalamin (,VITAMIN B-12,) 1000 MCG/ML injection Inject 1,000 mcg into the muscle every 30 (thirty) days.  Marland Kitchen ezetimibe (ZETIA) 10 MG tablet Take 10 mg by mouth daily.  . Multiple Vitamin (MULTIVITAMIN WITH MINERALS) TABS tablet Take 1 tablet by mouth daily. Centrum  . Omega-3 Fatty Acids (FISH OIL PO) Take 2 capsules by mouth daily.  Marland Kitchen tiZANidine (ZANAFLEX) 4 MG capsule Take 1 capsule (4 mg total) by mouth at bedtime.  . [DISCONTINUED] donepezil (ARICEPT) 10 MG tablet Take 1 tablet (10 mg total) by mouth at bedtime.  . baclofen (LIORESAL) 10 MG tablet Take 1 tablet (10 mg total) by mouth at bedtime.   No facility-administered encounter medications on file as of 05/17/2018.     Allergies  Allergen Reactions  . Procaine Other (See Comments)  REACTION:  Lowers blood pressure, "makes me loopy, can't move or talk"    Immunization History  Administered Date(s) Administered  . Influenza Split 05/20/2011, 06/23/2012  . Influenza Whole 07/15/2009, 05/22/2010  . Influenza, High Dose Seasonal PF 06/17/2016, 06/15/2017  . Influenza,inj,Quad PF,6+ Mos 06/30/2013, 07/02/2014, 07/01/2015   . Pneumococcal Polysaccharide-23 05/31/2013  . Tdap 01/16/2015    Current Medications, Allergies, Past Medical History, Past Surgical History, Family History, and Social History were reviewed in [employer] record.   Review of Systems        The patient complains of recent URI symproms- cough/ congestion, sore throat + gas/bloating, back pain, stiffness, arthritis, depression.  The patient denies fever, chills, sweats, anorexia, fatigue, weakness, malaise, weight loss, sleep disorder, blurring, diplopia, eye irritation, eye discharge, vision loss, eye pain, photophobia, earache, ear discharge, tinnitus, decreased hearing, nasal congestion, nosebleeds, sore throat, hoarseness, chest pain, palpitations, syncope, dyspnea on exertion, orthopnea, PND, peripheral edema, cough, dyspnea at rest, excessive sputum, hemoptysis, wheezing, pleurisy, nausea, vomiting, diarrhea, constipation, change in bowel habits, abdominal pain, melena, hematochezia, jaundice, indigestion/heartburn, dysphagia, odynophagia, dysuria, hematuria, urinary frequency, urinary hesitancy, nocturia, incontinence, joint pain, joint swelling, muscle cramps, muscle weakness, sciatica, restless legs, leg pain at night, leg pain with exertion, rash, itching, dryness, suspicious lesions, paralysis, paresthesias, seizures, tremors, vertigo, transient blindness, frequent falls, frequent headaches, difficulty walking, anxiety, memory loss, confusion, cold intolerance, heat intolerance, polydipsia, polyphagia, polyuria, unusual weight change, abnormal bruising, bleeding, enlarged lymph nodes, urticaria, allergic rash, and recurrent infections.     Objective:   Physical Exam     WD, WN, 72 y/o WF in NAD... GENERAL:  Alert & oriented; pleasant & cooperative... HEENT:  Blair/AT, EOM-full, PERRLA, EACs-clear, TMs-normal, NOSE-sl pale, THROAT- sl erythema w/o exud... NECK:  Supple w/ fairROM; no JVD; normal carotid impulses w/o  bruits; +thyromegaly w/ coarse bilat nodularity; no lymphadenopathy. CHEST:  Coarse BS w/ no wheezes, rales, or signs of consolidation. HEART:  Regular Rhythm; without murmurs/ rubs/ or gallops. ABDOMEN:  Soft & nontender; incr bowel sounds; no organomegaly or masses detected. EXT: without deformities or arthritic changes; no varicose veins/ venous insuffic/ or edema. NEURO:  no focal neuro deficits... DERM: no skin lesions or rash identified...  RADIOLOGY DATA:  Reviewed in the EPIC EMR & discussed w/ the patient...  LABORATORY DATA:  Reviewed in the EPIC EMR & discussed w/ the patient...   Assessment:      ACUTE ADM 5/27 - 01/15/18 for confusion, HA, AFib w/ rvr, acute labyrinthitis>  Hosp records reviewed in detail- confusion/encephalopathy resolved, dizziness resolved, AFib converted to NSR spont...  01/27/18>   As above- no etiology for the fever, HA, confusion was ascertained- called an acute encephalopathy & resolved spont (in assoc w/ treatment as described);  PAF converted spont & she remains on Amio200 & Eliquis5Bid for now- f/u w/ CARDS-EP due soon;  Dizziness, acute labyrinthitis resolved spont & hasn't recurred;  She remains fatigued/ tired/ weak in assoc w/ the stress she has been under trying to care for her elderly husb;  We plan ROV recheck in 58mo 02/28/18>   VEritreais improved & back to caring for her husb 24/7 by her report;  She has a number of somatic complaints and is under a lot of stress; CXR & Labs look good, heart rhythm remains regular, we plan ROV recheck in 261mo/27/19>   We accomodated her request for Aricept trial and switched her Lioresal to Zanaflex'4mg'$  Qhs as she feels this will help  her sleep;  We plan ROV recheck to assess response in 48mo.. 05/17/18>   Venera persists w/ mult somatic complaints, she is under a lot of stress caring for her elderly husb, she is taking Xanax ~0.'5mg'$  Tid, she is asked to accept the help of care givers to lighten her load and decr  her stress level;  She also appears to have subclinical hyperthyroidism w/ low TSH and normal FreeT3 & FreeT4, no dominantnodules on ultrasound (she is s/p left thyroid lobectomy 2014 as noted)=> we will refer to LMobilefor their review & to follow up going forward, she also sees DrTaylor for CARDS... We discussed my approaching retirement & [facility]   PAF>  This occurred 12/2017 & noted on adm to hosp, she developed 2 pauses on Metoprolol, EP considered pacer, she converted to NSR on her own & was placed on Amio200 & Eliquis5Bid; f/u w/ CARDS-EP is pending...   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  Thyroid>  Thyroid function was wnl, nodules are palp & Sonar 12/13 showed most nodules stable in size but dom nodule left lower pole is NEW & subseq left thyroid lobectomy surg by DrByers 1/14 (see above)... 05/17/18>  TSH has trended low c/w subclinical hyperthyroidism => refer to [facility]...  Episode confusion/ HA, CT/MRI w/ small vessel dis=> Hosp eval 5/19 was NEG, thought to have an acute encephalopathy, developed acute labyrinthitis in the Hosp=> all resolved in several days and she declined SNF- rehab & went home to care for husb...     Ortho- LBP, wrist, shoulder> she is managed by DrFields & is improved overall...  [facility] '70mg'$ /wk but pt stopped on her own using calcium, MVI, VitD; f/u BMD 10/14 showed lowest Tscore -2.1 & she wants to continue Fosamax70, calcium, MVI, VitD (level = 62)... BMD 11/16 IMPROVED & Alendronate stopped for 257yroneymoon  period...  B12 Deficiency>  She is delighted to see that her B12 level is sl low as she wants B12 shots and knows that this will give her more energy... REC to start B12 [id number] monthly => f/u B12 level is >1500 on these shots.  Anxiety/ Depression> she was stable on Prozac40 & Xanax 0.'5mg'$  as needed (taking one qhs); then she stopped the Prozac after reading something about potential side effects- offered Lexapro or Psyche referral for other med considerations but she declines...     Plan:     Patient's Medications  New Prescriptions   BACLOFEN (LIORESAL) 10 MG TABLET    Take 1 tablet (10 mg total) by mouth at bedtime.  Previous Medications   ACETAMINOPHEN (TYLENOL) 325 MG TABLET    Take 325-650 mg by mouth every 6 (six) hours as needed for headache (pain).   ACYCLOVIR (ZOVIRAX) 400 MG TABLET    Take 1 tablet (400 mg total) by mouth every 6 (six) hours as needed.   ALPRAZOLAM (XANAX) 0.5 MG TABLET    TAKE 1/2 TO 1 (ONE-HALF TO ONE) TABLET BY MOUTH THREE TIMES DAILY AS NEEDED  APIXABAN (ELIQUIS) 5 MG TABS TABLET    Take 1 tablet (5 mg total) by mouth 2 (two) times daily.   ASCORBIC ACID (VITAMIN C PO)    Take 1 tablet by mouth daily.   B COMPLEX VITAMINS TABLET    Take 1 tablet by mouth daily.   CYANOCOBALAMIN (,VITAMIN B-12,) 1000 MCG/ML INJECTION    Inject 1,000 mcg into the muscle every 30 (thirty) days.   EZETIMIBE (ZETIA) 10 MG TABLET    Take 10 mg by mouth daily.   MULTIPLE VITAMIN (MULTIVITAMIN WITH MINERALS) TABS TABLET    Take 1 tablet by mouth daily. Centrum   OMEGA-3 FATTY ACIDS (FISH OIL PO)    Take 2 capsules by mouth daily.   TIZANIDINE (ZANAFLEX) 4 MG CAPSULE    Take 1 capsule (4 mg total) by mouth at bedtime.  Modified Medications   No medications on file  Discontinued Medications   DONEPEZIL (ARICEPT) 10 MG TABLET    Take 1 tablet (10 mg total) by mouth at bedtime.

## 2018-05-19 ENCOUNTER — Encounter: Payer: Self-pay | Admitting: Pulmonary Disease

## 2018-05-19 ENCOUNTER — Other Ambulatory Visit (INDEPENDENT_AMBULATORY_CARE_PROVIDER_SITE_OTHER): Payer: PPO

## 2018-05-19 ENCOUNTER — Other Ambulatory Visit: Payer: Self-pay | Admitting: Pulmonary Disease

## 2018-05-19 DIAGNOSIS — E0590 Thyrotoxicosis, unspecified without thyrotoxic crisis or storm: Secondary | ICD-10-CM

## 2018-05-19 DIAGNOSIS — R5383 Other fatigue: Secondary | ICD-10-CM | POA: Diagnosis not present

## 2018-05-19 LAB — T3, FREE: T3 FREE: 3.2 pg/mL (ref 2.3–4.2)

## 2018-05-19 LAB — T4, FREE: FREE T4: 1.11 ng/dL (ref 0.60–1.60)

## 2018-05-19 LAB — TSH: TSH: 0.22 u[IU]/mL — ABNORMAL LOW (ref 0.35–4.50)

## 2018-05-19 NOTE — Progress Notes (Signed)
TSH, Free T3 and Free T4 per SN. Parke Poisson, CMA 05/19/18

## 2018-05-19 NOTE — Telephone Encounter (Signed)
Error

## 2018-05-20 ENCOUNTER — Other Ambulatory Visit: Payer: Self-pay | Admitting: Pulmonary Disease

## 2018-05-20 DIAGNOSIS — R7989 Other specified abnormal findings of blood chemistry: Secondary | ICD-10-CM

## 2018-05-20 LAB — IRON,TIBC AND FERRITIN PANEL
%SAT: 21 % (ref 16–45)
Ferritin: 43 ng/mL (ref 16–288)
IRON: 75 ug/dL (ref 45–160)
TIBC: 354 ug/dL (ref 250–450)

## 2018-05-26 ENCOUNTER — Ambulatory Visit
Admission: RE | Admit: 2018-05-26 | Discharge: 2018-05-26 | Disposition: A | Discharge status: Home / Self Care | Payer: PPO | Source: Ambulatory Visit | Attending: Pulmonary Disease | Admitting: Pulmonary Disease

## 2018-05-26 DIAGNOSIS — R7989 Other specified abnormal findings of blood chemistry: Secondary | ICD-10-CM

## 2018-05-26 DIAGNOSIS — R946 Abnormal results of thyroid function studies: Secondary | ICD-10-CM | POA: Diagnosis not present

## 2018-06-07 ENCOUNTER — Encounter: Payer: Self-pay | Admitting: Internal Medicine

## 2018-06-08 ENCOUNTER — Other Ambulatory Visit: Payer: Self-pay | Admitting: Pulmonary Disease

## 2018-06-09 ENCOUNTER — Telehealth: Payer: Self-pay | Admitting: Pulmonary Disease

## 2018-06-09 ENCOUNTER — Ambulatory Visit (INDEPENDENT_AMBULATORY_CARE_PROVIDER_SITE_OTHER): Payer: PPO

## 2018-06-09 DIAGNOSIS — E538 Deficiency of other specified B group vitamins: Secondary | ICD-10-CM | POA: Diagnosis not present

## 2018-06-09 MED ORDER — CYANOCOBALAMIN 1000 MCG/ML IJ SOLN
1000.0000 ug | Freq: Once | INTRAMUSCULAR | Status: AC
  Administered 2018-06-09: 1000 ug via INTRAMUSCULAR

## 2018-06-09 NOTE — Progress Notes (Signed)
Documented by Jessica Jones CMA based on hand-written B-12 Injection documentation sheet completed by Tammy Scott CMA, who administered the medication.  

## 2018-06-09 NOTE — Telephone Encounter (Signed)
Patient wants to be referred to Dr. Joseph Berkshire and also wants to speak to Dr. Lenna Gilford or his nurse.Marland Kitchen

## 2018-06-09 NOTE — Telephone Encounter (Signed)
LMTCB

## 2018-06-09 NOTE — Telephone Encounter (Signed)
Forwarding to PCC per protocol 

## 2018-06-13 ENCOUNTER — Telehealth: Payer: Self-pay | Admitting: Pulmonary Disease

## 2018-06-13 NOTE — Telephone Encounter (Signed)
Thanks for the update.  Called and spoke with pt letting her know the information stated by Vallarie Mare, [facility] in regards to the referral to endocrinology.  I gave pt the phone number for her to call to see if they could give her an update in regards to the referral. Nothing further needed.

## 2018-06-13 NOTE — Telephone Encounter (Signed)
Pt came into to [facility] on 06/09/18 to speak with me about becoming a patient of Dr. Alain Marion. Patient very insistent she needs to become a patient of Dr. Alain Marion.  Spent over 30 minutes with patient. Informed her that our physicians are not taking New Patients or transfers at this time, as we are having difficulty getting our established patients in when they have a need. Dr. Alain Marion in particular, has reduced his schedule. I did discuss with him and he cannot take on New Patients at this time.Pt did seem aware that we have other [facility] offices with great access with physicians, but she was not interested.  Based on my experience with Carly Rhodes, I would appreciate your help in finding her a PCP as she was unwilling to heed my advice.

## 2018-06-13 NOTE — Telephone Encounter (Signed)
Referrals placed to Endocrinologist are sent over and reviewed by the doctor's this one has been sent to Dr Ronnie Derby office and is in review the patient is welcome to call there office [phone] since they are the ones that call the patient once they review the records.

## 2018-06-13 NOTE — Telephone Encounter (Signed)
SN already aware. Patient has an appt next week and will discuss then. Nothing further is needed at this time.

## 2018-06-13 NOTE — Telephone Encounter (Signed)
Called and spoke with pt to see if she had heard anything in regards to an appt with an endocrinologist.  Pt stated she had not heard anything yet in regards to this.   PCCs, can you please help [place] out with this as an order was placed for pt to be referred to endocrinology on 05/20/18. Thanks!

## 2018-06-15 ENCOUNTER — Encounter: Payer: Self-pay | Admitting: Pulmonary Disease

## 2018-06-15 ENCOUNTER — Ambulatory Visit: Payer: PPO | Admitting: Pulmonary Disease

## 2018-06-15 VITALS — BP 120/78 | HR 67 | Temp 97.7°F | Ht 65.5 in | Wt 173.2 lb

## 2018-06-15 DIAGNOSIS — F341 Dysthymic disorder: Secondary | ICD-10-CM

## 2018-06-15 DIAGNOSIS — M150 Primary generalized (osteo)arthritis: Secondary | ICD-10-CM

## 2018-06-15 DIAGNOSIS — M545 Low back pain, unspecified: Secondary | ICD-10-CM

## 2018-06-15 DIAGNOSIS — E0590 Thyrotoxicosis, unspecified without thyrotoxic crisis or storm: Secondary | ICD-10-CM

## 2018-06-15 DIAGNOSIS — M159 Polyosteoarthritis, unspecified: Secondary | ICD-10-CM

## 2018-06-15 DIAGNOSIS — E538 Deficiency of other specified B group vitamins: Secondary | ICD-10-CM

## 2018-06-15 DIAGNOSIS — E785 Hyperlipidemia, unspecified: Secondary | ICD-10-CM

## 2018-06-15 DIAGNOSIS — E042 Nontoxic multinodular goiter: Secondary | ICD-10-CM

## 2018-06-15 DIAGNOSIS — Z8669 Personal history of other diseases of the nervous system and sense organs: Secondary | ICD-10-CM

## 2018-06-15 MED ORDER — BACLOFEN 10 MG PO TABS: 10.0000 mg | ORAL_TABLET | Freq: Every day | ORAL | 5 refills | Status: DC

## 2018-06-15 MED ORDER — ALPRAZOLAM 0.5 MG PO TABS: ORAL_TABLET | ORAL | 3 refills | Status: DC

## 2018-06-15 NOTE — Patient Instructions (Signed)
Today we updated your med list in our EPIC system...    Continue your current medications the same...  Keep up the good work w/ your diet & exercise program...   Try to de-stress your life, and enjoy the journey!  We will arrange for a NEUROLOGY consult as you requested... And we will check on the status of the ENDOCRINE consult we previously discussed (for your thyroid)...  We gave you hand-outs w/ information about the [place] and [facility] primary care offices.Marland KitchenMarland Kitchen

## 2018-06-16 ENCOUNTER — Encounter: Payer: Self-pay | Admitting: Neurology

## 2018-06-20 ENCOUNTER — Telehealth: Payer: Self-pay | Admitting: Cardiology

## 2018-06-20 NOTE — Telephone Encounter (Signed)
Patient called and stated that she finished taking all of her blood thinners and she has an appt w/ GT on Friday 06-24-18. She stated that she doesn't wang to take the medication any longer.

## 2018-06-20 NOTE — Telephone Encounter (Signed)
Pt reports that she does not wish to take blood thinner any longer if she does not need to.  She is currently out of it and has not had any further AFib since her acute infection earlier this year.  Informed that she and Dr. Lovena Le can make that decision when she sees him this Friday. Pt appreciates the call.  Will forward to [facility] for his Juluis Rainier and if he has any advisement prior to her appt this Friday.

## 2018-06-23 NOTE — Telephone Encounter (Signed)
Will address tomorrow at [place].  06/24/2018

## 2018-06-24 ENCOUNTER — Encounter: Payer: Self-pay | Admitting: Internal Medicine

## 2018-06-24 ENCOUNTER — Ambulatory Visit: Payer: PPO | Admitting: Internal Medicine

## 2018-06-24 VITALS — BP 124/62 | HR 80 | Ht 65.5 in | Wt 175.0 lb

## 2018-06-24 DIAGNOSIS — R001 Bradycardia, unspecified: Secondary | ICD-10-CM

## 2018-06-24 DIAGNOSIS — I480 Paroxysmal atrial fibrillation: Secondary | ICD-10-CM | POA: Diagnosis not present

## 2018-06-24 MED ORDER — APIXABAN 5 MG PO TABS: 5.0000 mg | ORAL_TABLET | Freq: Two times a day (BID) | ORAL | 3 refills | Status: DC

## 2018-06-24 NOTE — Progress Notes (Signed)
HPI [name] returns today for followup. I had seen her in the hospital when she had presented with AMS in the setting of an acute febrile illness, was found to have atrial fib with a RVR, placed initially on beta blockers where she had a long pause and switched to low dose amiodarone and an [place]. She returns today and notes her heart rhythm has been good. She denies palpitations. Despite stopping amio, her hair has not grown back. She has stopped taking her eliquis.   Allergies  Allergen Reactions  . Procaine Other (See Comments)    REACTION:  Lowers blood pressure, "makes me loopy, can't move or talk"     Current Outpatient Medications  Medication Sig Dispense Refill  . acetaminophen (TYLENOL) 325 MG tablet Take 325-650 mg by mouth every 6 (six) hours as needed for headache (pain).    Marland Kitchen acyclovir (ZOVIRAX) 400 MG tablet Take 1 tablet (400 mg total) by mouth every 6 (six) hours as needed. 30 tablet 2  . ALPRAZolam (XANAX) 0.5 MG tablet TAKE 1/2 to 1  tab three times a day as needed 90 tablet 3  . Ascorbic Acid (VITAMIN C PO) Take 1 tablet by mouth daily.    Marland Kitchen b complex vitamins tablet Take 1 tablet by mouth daily.    . baclofen (LIORESAL) 10 MG tablet Take 1 tablet (10 mg total) by mouth at bedtime. 30 each 5  . cyanocobalamin (,VITAMIN B-12,) 1000 MCG/ML injection Inject 1,000 mcg into the muscle every 30 (thirty) days.    Marland Kitchen ezetimibe (ZETIA) 10 MG tablet TAKE 1 TABLET BY MOUTH ONCE DAILY 90 tablet 1  . Multiple Vitamin (MULTIVITAMIN WITH MINERALS) TABS tablet Take 1 tablet by mouth daily. Centrum    . Omega-3 Fatty Acids (FISH OIL PO) Take 2 capsules by mouth daily.    Marland Kitchen tiZANidine (ZANAFLEX) 4 MG capsule Take 1 capsule (4 mg total) by mouth at bedtime. 30 capsule 0  . apixaban (ELIQUIS) 5 MG TABS tablet Take 1 tablet (5 mg total) by mouth 2 (two) times daily. 180 tablet 3   No current facility-administered medications for this visit.      Past Medical History:  Diagnosis  Date  . Anxiety   . Depression   . Family history of anesthesia complication    sister extreme nausea & vomiting  . 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 Osteoporosis   . Seasonal allergies     ROS:   All systems reviewed and negative except as noted in the HPI.   Past Surgical History:  Procedure Laterality Date  . ABDOMINAL HYSTERECTOMY  1984  . APPENDECTOMY  1984   at time of Hysterectomy  . HAMMER TOE SURGERY  1993   2nd toe left foot  . THYROID LOBECTOMY  09/15/2012   Procedure: THYROID LOBECTOMY;  Surgeon: Melissa Montane, MD;  Location: [facility];  Service: ENT;  Laterality: Left;  . THYROIDECTOMY, PARTIAL  09/15/2012   Dr Janace Hoard  . TONSILLECTOMY  1957     Family History  Problem Relation Age of Onset  . Stomach cancer Brother 44  . Colon cancer Paternal Grandmother 38  . Diabetes Father   . Hypertension Sister   . Heart attack Neg Hx      Social History   Socioeconomic History  . Marital status: Divorced    Spouse name: Not on file  . Number of children: Not on file  . Years of education: Not on file  .  Highest education level: Not on file  Occupational History  . Not on file  Social Needs  . Financial resource strain: Not on file  . Food insecurity:    Worry: Not on file    Inability: Not on file  . Transportation needs:    Medical: Not on file    Non-medical: Not on file  Tobacco Use  . Smoking status: Never Smoker  . Smokeless tobacco: Never Used  Substance and Sexual Activity  . Alcohol use: No  . Drug use: No  . Sexual activity: Not Currently    Birth control/protection: Post-menopausal, Surgical  Lifestyle  . Physical activity:    Days per week: Not on file    Minutes per session: Not on file  . Stress: Not on file  Relationships  . Social connections:    Talks on phone: Not on file    Gets together: Not on file    Attends religious service: Not on file    Active member of club or organization: Not on file    Attends meetings of clubs  or organizations: Not on file    Relationship status: Not on file  . Intimate partner violence:    Fear of current or ex partner: Not on file    Emotionally abused: Not on file    Physically abused: Not on file    Forced sexual activity: Not on file  Other Topics Concern  . Not on file  Social History Narrative  . Not on file     BP 124/62   Pulse 80   Ht 5' 5.5" (1.664 m)   Wt 175 lb (79.4 kg)   BMI 28.68 kg/m   Physical Exam:  Well appearing NAD HEENT: Unremarkable Neck:  No JVD, no thyromegally Lymphatics:  No adenopathy Back:  No CVA tenderness Lungs:  Clear with no wheezes HEART:  Regular rate rhythm, no murmurs, no rubs, no clicks Abd:  soft, positive bowel sounds, no organomegally, no rebound, no guarding Ext:  2 plus pulses, no edema, no cyanosis, no clubbing Skin:  No rashes no nodules Neuro:  CN II through XII intact, motor grossly intact  EKG - nsr   Assess/Plan: 1. PAF - she is maintaining NSR. I have recommended she restart eliquis and she is willing. 2. HTN - her blood pressure is reasonable today.  3. Sinus node dysfunction - she is asymptomatic and has no additional bradycardia. No indication for PPM at this time. 4. Disp. - she does not have a primary care MD and I will refer her to Dr. Regis Bill or one of her partners.  Mikle Bosworth.D.

## 2018-06-24 NOTE — Patient Instructions (Addendum)
Medication Instructions:  Your physician recommends that you continue on your current medications as directed. Please refer to the Current Medication list given to you today.  Labwork: None ordered.  Testing/Procedures: None ordered.  Follow-Up: Your physician wants you to follow-up in: 6 months with Dr. Lovena Le.   You will receive a reminder letter in the mail two months in advance. If you don't receive a letter, please call our office to schedule the follow-up appointment.  Any Other Special Instructions Will Be Listed Below (If Applicable).  A referral has been made to Dr. Regis Bill with [facility]. They will contact you to set up an appointment.  If you need a refill on your cardiac medications before your next appointment, please call your pharmacy.

## 2018-06-27 NOTE — Progress Notes (Signed)
Name: Carly Rhodes  MRN/ DOB: [id number], 1946-08-11    Age/ Sex: 72 y.o., female    PCP: Noralee Space, MD   Reason for Endocrinology Evaluation: Abnormal TSH      Date of Initial Endocrinology Evaluation: 06/29/2018     HPI: [name] is a 72 y.o. female with a past medical history of A. Fib, low bone density, HTN  and anxiety . The patient presented for initial endocrinology clinic visit on 06/29/2018 for consultative assistance with her abnormal TSH .    She has a Hx of left thyroid lobectomy in 2014 due to [place]. With benign pathology.  She had a hospital admission in May, 2019 for encephalopathy and was noted ot have new onset A.Fib. She was discharged on amiodarone at the time.   Amiodarone was stopped in August, 2019.   She denies any local neck symptoms such as swelling, pain, voice hoarseness or dysphagia.  She denies any hyperthyroid symptoms such as weight loss, heat intolerance or diarrhea. 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   Patient denies previous personal fracture in the past, has a low bone density  but no prior treatment.  She is on low calcium diet, she does not take calcium or vitamin D supplements.   No FH of thyroid disease.    HISTORY:  Past Medical History:  Past Medical History:  Diagnosis Date  . Anxiety   . Depression   . Family history of anesthesia complication    sister extreme nausea & vomiting  . 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 Osteoporosis   . Seasonal allergies    Past Surgical History:  Past Surgical History:  Procedure Laterality Date  . ABDOMINAL HYSTERECTOMY  1984  . APPENDECTOMY  1984   at time of Hysterectomy  . HAMMER TOE SURGERY  1993   2nd toe left foot  . THYROID LOBECTOMY  09/15/2012   Procedure: THYROID LOBECTOMY;  Surgeon: Melissa Montane, MD;  Location: [facility];  Service: ENT;  Laterality: Left;  . THYROIDECTOMY, PARTIAL  09/15/2012   Dr  Janace Hoard  . TONSILLECTOMY  1957      Social History:  reports that she has never smoked. She has never used smokeless tobacco. She reports that she does not drink alcohol or use drugs.  Family History: family history includes Colon cancer (age of onset: 28) in her paternal grandmother; Diabetes in her father; Hypertension in her sister; Stomach cancer (age of onset: 14) in her brother.   HOME MEDICATIONS: Current Outpatient Medications on File Prior to Visit  Medication Sig Dispense Refill  . acetaminophen (TYLENOL) 325 MG tablet Take 325-650 mg by mouth every 6 (six) hours as needed for headache (pain).    Marland Kitchen acyclovir (ZOVIRAX) 400 MG tablet Take 1 tablet (400 mg total) by mouth every 6 (six) hours as needed. 30 tablet 2  . ALPRAZolam (XANAX) 0.5 MG tablet TAKE 1/2 to 1  tab three times a day as needed 90 tablet 3  . apixaban (ELIQUIS) 5 MG TABS tablet Take 1 tablet (5 mg total) by mouth 2 (two) times daily. 180 tablet 3  . Ascorbic Acid (VITAMIN C PO) Take 1 tablet by mouth daily.    Marland Kitchen b complex vitamins tablet Take 1 tablet by mouth daily.    . baclofen (LIORESAL) 10 MG tablet Take 1 tablet (10 mg total) by mouth at  bedtime. 30 each 5  . cyanocobalamin (,VITAMIN B-12,) 1000 MCG/ML injection Inject 1,000 mcg into the muscle every 30 (thirty) days.    Marland Kitchen ezetimibe (ZETIA) 10 MG tablet TAKE 1 TABLET BY MOUTH ONCE DAILY 90 tablet 1  . Multiple Vitamin (MULTIVITAMIN WITH MINERALS) TABS tablet Take 1 tablet by mouth daily. Centrum    . Omega-3 Fatty Acids (FISH OIL PO) Take 2 capsules by mouth daily.    Marland Kitchen tiZANidine (ZANAFLEX) 4 MG capsule Take 1 capsule (4 mg total) by mouth at bedtime. 30 capsule 0   No current facility-administered medications on file prior to visit.       REVIEW OF SYSTEMS: A comprehensive ROS was conducted with the patient and is negative except as per HPI and below:  Review of Systems  Constitutional: Positive for malaise/fatigue. Negative for weight loss.  HENT:  Negative for sore throat.   Respiratory: Positive for cough. Negative for shortness of breath.   Cardiovascular: Positive for palpitations. Negative for chest pain.  Gastrointestinal: Negative for abdominal pain, diarrhea and nausea.  Genitourinary: Negative for frequency.  Musculoskeletal: Negative for neck pain.  Skin: Negative.   Neurological: Positive for dizziness and sensory change. Negative for tingling.       Pt with abnormal gait since her questionable meningitis in May, 2019  Endo/Heme/Allergies: Negative for polydipsia.  Psychiatric/Behavioral: Negative for depression. The patient is nervous/anxious.        OBJECTIVE:  VS: BP 122/78   Pulse 70   Resp 16   Wt 173 lb (78.5 kg)   SpO2 98%   BMI 28.35 kg/m    Wt Readings from Last 3 Encounters:  06/29/18 173 lb (78.5 kg)  06/24/18 175 lb (79.4 kg)  06/15/18 173 lb 3.2 oz (78.6 kg)     EXAM: General: Pt appears well and is in NAD  Hydration: Well-hydrated with moist mucous membranes and good skin turgor  Eyes: External eye exam normal without stare, lid lag or exophthalmos.  EOM intact.    Ears, Nose, Throat: Hearing: Grossly intact bilaterally Dental: Good dentition  Throat: Clear without mass, erythema or exudate  Neck: General: Supple without adenopathy. Thyroid: Thyroid size normal.  A right thyroid nodules appreciated.   Lungs: Clear with good BS bilat with no rales, rhonchi, or wheezes  Heart: Auscultation: RRR.  Abdomen: Normoactive bowel sounds, soft, nontender, without masses or organomegaly palpable  Extremities:  BL LE: No pretibial edema normal ROM and strength.  Skin: Hair: Texture and amount normal with gender appropriate distribution Skin Inspection: No rashes. Skin Palpation: Skin temperature, texture, and thickness normal to palpation  Neuro: Cranial nerves: II - XII grossly intact  Motor: Normal strength throughout DTRs: 2+ and symmetric in UE without delay in relaxation phase  Mental Status:  Judgment, insight: Intact Orientation: Oriented to time, place, and person Gait: Abnormal, pt with wide gait and instability noted  Mood and affect: No depression, anxiety, or agitation     DATA REVIEWED:       Results for GARNELL, BEGEMAN (MRN [id number]) as of 06/27/2018 15:19  Ref. Range 05/19/2018 11:49  TSH Latest Ref Range: 0.35 - 4.50 uIU/mL 0.22 (L)  Triiodothyronine,Free,Serum Latest Ref Range: 2.3 - 4.2 pg/mL 3.2  T4,Free(Direct) Latest Ref Range: 0.60 - 1.60 ng/dL 1.11  Results for SOLEIA, BADOLATO (MRN [id number]) as of 06/27/2018 15:19  Ref. Range 05/17/2018 10:40 05/19/2018 11:49  TSH Latest Ref Range: 0.35 - 4.50 uIU/mL 0.24 (L) 0.22 (L)    Thyroid Ultrasound  05/26/18 Parenchymal Echotexture: Markedly heterogenous  Isthmus: 1.3 cm, previously 1.5 cm  Right lobe: 6.0 x 1.8 x 2.8 cm, previously 6.7 x 2.6 x 2.7 cm  Left lobe: 2.4 x 1.2 x 1.6 cm, previously 2.1 x 1.3 x 1.5 cm  _________________________________________________________  Estimated total number of nodules >/= 1 cm: 0  Number of spongiform nodules >/=  2 cm not described below (TR1): 0  Number of mixed cystic and solid nodules >/= 1.5 cm not described below (TR2): 0  _________________________________________________________  Complex right nodule 1 measures 0.8 cm and previously measured 0.8 cm. It does not meet criteria for biopsy nor follow-up.  IMPRESSION: There are no nodules which meet criteria for biopsy nor follow-up. The gland is markedly heterogeneous.  ASSESSMENT/PLAN/RECOMMENDATIONS:   1. Abnormal TSH :    - Pt clinically euthyroid  - Her TSH is mildly low, with normal-high  T3 and T4 levels .  - Pt already with personal hx of A.Fib and low bone density in 2016 and I would recommend treatment for sub-clinical hyperthyroidism - D/D include Amiodarone induced thyroid disease, vs toxic adenoma vs sub-clinical grave's disease.  - We will obtain  Thyroid uptake and scan , I  am hoping since she did not go on Amiodarone for a long period of time and has been already off of it for 3 months , that will not interfere with thyroid uptake and scan.   2. Low Bone Density :  -  Based on 2016 DXA scan , she has low bone density and NOT osteoporosis.  - Recommend ~1200 mg of calcium in her diet and if she is not able to keep up with that much nutritionally, she may supplement with OTC calcium carbonate, I also encouraged her to start Vitamin D 3 1000 units daily     F/u in 6 weeks     Signed electronically by: Mack Guise, MD  [facility] Endocrinology  [facility] [address] [place],  [address] Phone: [phone] FAX: [phone]   CC: Noralee Space, MD [place] [address] Phone: [phone] Fax: [phone]   Return to Endocrinology clinic as below: Future Appointments  Date Time Provider [facility]  07/11/2018  9:00 AM [facility] None  08/02/2018  9:00 AM Cameron Sprang, MD [facility] None

## 2018-06-29 ENCOUNTER — Ambulatory Visit (INDEPENDENT_AMBULATORY_CARE_PROVIDER_SITE_OTHER): Payer: PPO | Admitting: Internal Medicine

## 2018-06-29 ENCOUNTER — Encounter: Payer: Self-pay | Admitting: Internal Medicine

## 2018-06-29 ENCOUNTER — Telehealth: Payer: Self-pay | Admitting: *Deleted

## 2018-06-29 VITALS — BP 122/78 | HR 70 | Resp 16 | Ht 65.5 in | Wt 173.0 lb

## 2018-06-29 DIAGNOSIS — R7989 Other specified abnormal findings of blood chemistry: Secondary | ICD-10-CM

## 2018-06-29 DIAGNOSIS — M8580 Other specified disorders of bone density and structure, unspecified site: Secondary | ICD-10-CM

## 2018-06-29 DIAGNOSIS — M859 Disorder of bone density and structure, unspecified: Secondary | ICD-10-CM

## 2018-06-29 NOTE — Telephone Encounter (Signed)
Copied from [place] [id number]. Topic: Appointment Scheduling - Scheduling Inquiry for Clinic >> Jun 28, 2018 11:47 AM Scherrie Gerlach wrote: Reason for CRM: pt states Dr Lovena Le referred her to Dr Regis Bill. (Pt used to see Dr Lenna Gilford as her pcp)  I advised pt Dr Regis Bill is not accepting new pt.  She said well that is who he wants me to see. So I advised pt I would send a message and ask. If she will not accept, please advise on who will be good.  Pt has health issues.(Dr Jerilee Hoh?)

## 2018-06-29 NOTE — Patient Instructions (Signed)
-    We will set you up for a thyroid scan at the hospital. This will be a 2 day process. If you do not hear about your appointment in 2 weeks please call us back.  - Please take 1200 mg of calcium a day (You can pick from food below ) - Please take Vitamin D3 1000 units daily    FOODS THAT CONTAIN CALCIUM  Calcium can be found in many foods, not only in dairy products.  Dairy Foods  Yogurt (1 cup) 350 mg  Milk (1 cup) 300 mg  Cheddar cheese (1 oz.) 204 mg  Ricotta cheese, part skim (1/4 cup) 169 mg  Cottage cheese (1 cup) 150 mg  Nondairy Foods  Whole Grain Total cereal (3/4 cup) 1000 mg  Pink salmon with bones, sardines (3 oz., cooked) 181 mg  Black beans (1 cup) 103 mg  Broccoli (1 cup, cooked) 150 mg  Almonds (1 tbsp.) 50 mg  Soy Products  Soy yogurt with calcium (3/4 cup) 300 mg  Soy milk enriched with calcium (1 cup) 300 mg  Tofu, firm or extra firm (1/4 cup) 250 mg  Soy nuts, roasted/salted (1/2 cup) 103 mg

## 2018-06-29 NOTE — Telephone Encounter (Signed)
I think dr Jerilee Hoh would be a good choice  She is an internal medicine specialist .

## 2018-06-29 NOTE — Telephone Encounter (Signed)
Left message on machine for patient to return our call CRM 

## 2018-07-05 NOTE — Telephone Encounter (Signed)
Appointment made for 08/03/18 with Dr Jerilee Hoh

## 2018-07-11 ENCOUNTER — Ambulatory Visit (INDEPENDENT_AMBULATORY_CARE_PROVIDER_SITE_OTHER): Payer: PPO

## 2018-07-11 ENCOUNTER — Telehealth: Payer: Self-pay | Admitting: Pulmonary Disease

## 2018-07-11 DIAGNOSIS — E538 Deficiency of other specified B group vitamins: Secondary | ICD-10-CM

## 2018-07-11 MED ORDER — CYANOCOBALAMIN 1000 MCG/ML IJ SOLN
1000.0000 ug | Freq: Once | INTRAMUSCULAR | Status: AC
  Administered 2018-07-11: 1000 ug via INTRAMUSCULAR

## 2018-07-11 NOTE — Telephone Encounter (Signed)
Will bring notary  tomorrow 07/12/18, and will contact patient to pick up.

## 2018-07-11 NOTE — Progress Notes (Signed)
Documentation of medication administration and charges of Vitamin B21 have been completed by Desmond Dike, CMA based on the hand written Vitamin B12 documentation sheet completed by Alroy Bailiff, who administered the medication.

## 2018-07-11 NOTE — Telephone Encounter (Signed)
Forms retrieved, filled out to the best of my ability, and given to [facility] for completion.   Routing to [facility] [place] for follow-up.

## 2018-07-12 NOTE — Telephone Encounter (Signed)
Forms signed by SN and notarized.  Forms placed in sealed envelope at front desk for pick up.  Patient called and is aware she can come pick up forms.  Nothing further at this time.

## 2018-07-21 ENCOUNTER — Telehealth: Payer: Self-pay | Admitting: Pulmonary Disease

## 2018-07-21 NOTE — Telephone Encounter (Signed)
Called and spoke with patient.  She stated that she had lost the [place] form today.  She is trying to find it.  Advised her that [facility] would need the original form, because of the notary.  Advised Patient of Dr. Jeannine Kitten retirement.  Nothing further at this time.

## 2018-07-25 ENCOUNTER — Telehealth: Payer: Self-pay | Admitting: Internal Medicine

## 2018-07-25 NOTE — Telephone Encounter (Signed)
Patient stated that she is suppose to have a thyroid scan done at the hospital before coming in to see Dr Kelton Pillar,. She has not heard back from anyone and would like to know if we can give her a number to call or find out why she has not heard from anyone.  Please advise

## 2018-07-25 NOTE — Telephone Encounter (Signed)
Provided pt with contact info for [facility]. Verbalized acceptance and understanding.

## 2018-08-02 ENCOUNTER — Ambulatory Visit (INDEPENDENT_AMBULATORY_CARE_PROVIDER_SITE_OTHER): Payer: PPO | Admitting: Neurology

## 2018-08-02 ENCOUNTER — Encounter: Payer: Self-pay | Admitting: Neurology

## 2018-08-02 ENCOUNTER — Other Ambulatory Visit: Payer: Self-pay

## 2018-08-02 VITALS — BP 118/72 | HR 84 | Ht 65.5 in | Wt 173.0 lb

## 2018-08-02 DIAGNOSIS — G3184 Mild cognitive impairment, so stated: Secondary | ICD-10-CM

## 2018-08-02 DIAGNOSIS — Z7189 Other specified counseling: Secondary | ICD-10-CM | POA: Diagnosis not present

## 2018-08-02 DIAGNOSIS — G629 Polyneuropathy, unspecified: Secondary | ICD-10-CM

## 2018-08-02 DIAGNOSIS — R2681 Unsteadiness on feet: Secondary | ICD-10-CM | POA: Diagnosis not present

## 2018-08-02 NOTE — Patient Instructions (Addendum)
1. Recommend Balance Therapy 2. Recommend getting more help at home 3. Recommend having a power of attorney 4. Follow-up in 6 months, call for any changes  FALL PRECAUTIONS: Be cautious when walking. Scan the area for obstacles that may increase the risk of trips and falls. When getting up in the mornings, sit up at the edge of the bed for a few minutes before getting out of bed. Consider elevating the bed at the head end to avoid drop of blood pressure when getting up. Walk always in a well-lit room (use night lights in the walls). Avoid area rugs or power cords from appliances in the middle of the walkways. Use a walker or a cane if necessary and consider physical therapy for balance exercise. Get your eyesight checked regularly.  FINANCIAL OVERSIGHT: Supervision, especially oversight when making financial decisions or transactions is also recommended.  HOME SAFETY: Consider the safety of the kitchen when operating appliances like stoves, microwave oven, and blender. Consider having supervision and share cooking responsibilities until no longer able to participate in those. Accidents with firearms and other hazards in the house should be identified and addressed as well.  DRIVING: Regarding driving, in patients with progressive memory problems, driving will be impaired. We advise to have someone else do the driving if trouble finding directions or if minor accidents are reported. Independent driving assessment is available to determine safety of driving.  ABILITY TO BE LEFT ALONE: If patient is unable to contact 911 operator, consider using LifeLine, or when the need is there, arrange for someone to stay with patients. Smoking is a fire hazard, consider supervision or cessation. Risk of wandering should be assessed by caregiver and if detected at any point, supervision and safe proof recommendations should be instituted.  MEDICATION SUPERVISION: Inability to self-administer medication needs to be  constantly addressed. Implement a mechanism to ensure safe administration of the medications.  RECOMMENDATIONS FOR ALL PATIENTS WITH MEMORY PROBLEMS: 1. Continue to exercise (Recommend 30 minutes of walking everyday, or 3 hours every week) 2. Increase social interactions - continue going to [place] and enjoy social gatherings with friends and family 3. Eat healthy, avoid fried foods and eat more fruits and vegetables 4. Maintain adequate blood pressure, blood sugar, and blood cholesterol level. Reducing the risk of stroke and cardiovascular disease also helps promoting better memory. 5. Avoid stressful situations. Live a simple life and avoid aggravations. Organize your time and prepare for the next day in anticipation. 6. Sleep well, avoid any interruptions of sleep and avoid any distractions in the bedroom that may interfere with adequate sleep quality 7. Avoid sugar, avoid sweets as there is a strong link between excessive sugar intake, diabetes, and cognitive impairment The Mediterranean diet has been shown to help patients reduce the risk of progressive memory disorders and reduces cardiovascular risk. This includes eating fish, eat fruits and green leafy vegetables, nuts like almonds and hazelnuts, walnuts, and also use olive oil. Avoid fast foods and fried foods as much as possible. Avoid sweets and sugar as sugar use has been linked to worsening of memory function.  There is always a concern of gradual progression of memory problems. If this is the case, then we may need to adjust level of care according to patient needs. Support, both to the patient and caregiver, should then be put into place.

## 2018-08-02 NOTE — Progress Notes (Signed)
NEUROLOGY CONSULTATION NOTE  MORA PEDRAZA MRN: [id number] DOB: 30-May-1946  Referring provider: Dr. Teressa Lower Primary care provider: Dr. Teressa Lower  Reason for consult:  History of encephalopathy  Dear Dr Lenna Gilford:  Thank you for your kind referral of [facility] for consultation of the above symptoms. Although her history is well known to you, please allow me to reiterate it for the purpose of our medical record. She is alone in the office today. Records and images were personally reviewed where available.  HISTORY OF PRESENT ILLNESS: This is a 72 year old right-handed woman with a history of atrial fibrillation on Eliquis, hypothyroidism, anxiety, depression, presenting for evaluation after hospitalization for encephalopathy. She was under the impression that she had a brain infection at that time and wonders is the brain infection is causing her fatigue. Records from her hospitalization in May 2019 were reviewed and it was clarified with the patient that she did not have a brain infection during her hospital admission. She was evaluated by Neurology and had an unremarkable MRI brain, EEG, and lumbar puncture, diagnosis of altered mental status in the setting of high fever, most likely explanation is viral syndrome with delirium with post-viral labyrinthitis. She presented to the hospital for headaches and diffuse weakness leading to a fall. She had not been eating for a few days. Her husband was unable to assist her off the floor. She woke up the next morning with speech difficulty, she could understand people but could not speak. She was febrile on arrival to the ED and was noted to be able to give history but show signs of altered mental status. She was found to have atrial fibrillation with RVR and started on Eliquis. I personally reviewed MRI brain with and without contrast done 01/13/18 which did not show any acute changes, there was mild diffuse atrophy, mild chronic  microvascular disease. Cuts through the IACs showed normal 7th and 8th cranial nerves, no enhancing mass in temporal region. Her EEG was normal. She had a lumbar puncture with CSF showing WBC of 2, RBC 4400 in tub 1, 515 in tube 4, normal protein, glucose, negative HSV 1 and 2, negative CSF culture. She was treated empirically with acyclovir, ceftriaxone, and ampicillin, which were discontinued when cultures came back negative. During her stay, she developed vertigo and persistent right-beating nystagmus with rotary component in all directions, consistent with labyrinthitis. She described illusions/delusions during her stay.   She denies any further headaches, vertigo, or hallucinations since her hospitalization in May 2019. Her main concern is her balance. She states most of the time it is good, but other times it is so bad, especially in an enclosed area. She has not fallen but has had to hold on to objects. She states most of the time she can correct herself. She denies any focal numbness/tingling/weakness, neck/back pain, bowel/bladder dysfunction. She states she does not have headaches, but a place in the back of her head feels funny at times then hurts, she takes a Tylenol with good effect. She denies any vertigo, diplopia, dysarthria/dysphagia. She has noticed cognitive difficulties since her hospitalization in May. She is the primary caregiver of her 25 year old husband. They have been married for 3 years. She states she looks after him and does everything for him. He has a scooter for transfers, she does not need to help him to get around, but she bathes him once a week. She knows she needs help but does not know how to  do it since "everything is so personal." She manages finances and has had more trouble since coming out of the hospital. She manages her husband's medications and states she does not forget them, but sometimes forgets her own medication. If she is unsure if she took the Eliquis, she would  not take it that day.   Laboratory Data: Lab Results  Component Value Date   TSH 0.22 (L) 05/19/2018   Lab Results  Component Value Date   VITAMINB12 >1500 (H) 06/21/2017     PAST MEDICAL HISTORY: Past Medical History:  Diagnosis Date  . Anxiety   . Depression   . Family history of anesthesia complication    sister extreme nausea & vomiting  . 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 Osteoporosis   . Seasonal allergies     PAST SURGICAL HISTORY: Past Surgical History:  Procedure Laterality Date  . ABDOMINAL HYSTERECTOMY  1984  . APPENDECTOMY  1984   at time of Hysterectomy  . HAMMER TOE SURGERY  1993   2nd toe left foot  . THYROID LOBECTOMY  09/15/2012   Procedure: THYROID LOBECTOMY;  Surgeon: Melissa Montane, MD;  Location: [facility];  Service: ENT;  Laterality: Left;  . THYROIDECTOMY, PARTIAL  09/15/2012   Dr Janace Hoard  . TONSILLECTOMY  1957    MEDICATIONS: Current Outpatient Medications on File Prior to Visit  Medication Sig Dispense Refill  . acetaminophen (TYLENOL) 325 MG tablet Take 325-650 mg by mouth every 6 (six) hours as needed for headache (pain).    Marland Kitchen acyclovir (ZOVIRAX) 400 MG tablet Take 1 tablet (400 mg total) by mouth every 6 (six) hours as needed. 30 tablet 2  . ALPRAZolam (XANAX) 0.5 MG tablet TAKE 1/2 to 1  tab three times a day as needed 90 tablet 3  . apixaban (ELIQUIS) 5 MG TABS tablet Take 1 tablet (5 mg total) by mouth 2 (two) times daily. 180 tablet 3  . Ascorbic Acid (VITAMIN C PO) Take 1 tablet by mouth daily.    Marland Kitchen b complex vitamins tablet Take 1 tablet by mouth daily.    . baclofen (LIORESAL) 10 MG tablet Take 1 tablet (10 mg total) by mouth at bedtime. 30 each 5  . cyanocobalamin (,VITAMIN B-12,) 1000 MCG/ML injection Inject 1,000 mcg into the muscle every 30 (thirty) days.    Marland Kitchen ezetimibe (ZETIA) 10 MG tablet TAKE 1 TABLET BY MOUTH ONCE DAILY 90 tablet 1  . Multiple Vitamin (MULTIVITAMIN WITH MINERALS) TABS tablet Take 1 tablet by mouth daily. Centrum     . Omega-3 Fatty Acids (FISH OIL PO) Take 2 capsules by mouth daily.    Marland Kitchen tiZANidine (ZANAFLEX) 4 MG capsule Take 1 capsule (4 mg total) by mouth at bedtime. 30 capsule 0   No current facility-administered medications on file prior to visit.     ALLERGIES: Allergies  Allergen Reactions  . Procaine Other (See Comments)    REACTION:  Lowers blood pressure, "makes me loopy, can't move or talk"    FAMILY HISTORY: Family History  Problem Relation Age of Onset  . Stomach cancer Brother 20  . Colon cancer Paternal Grandmother 23  . Diabetes Father   . Hypertension Sister   . Heart attack Neg Hx     SOCIAL HISTORY: Social History   Socioeconomic History  . Marital status: Married    Spouse name: Not on file  . Number of children: Not on file  . Years of education: Not on file  . Highest  education level: Not on file  Occupational History  . Not on file  Social Needs  . Financial resource strain: Not on file  . Food insecurity:    Worry: Not on file    Inability: Not on file  . Transportation needs:    Medical: Not on file    Non-medical: Not on file  Tobacco Use  . Smoking status: Never Smoker  . Smokeless tobacco: Never Used  Substance and Sexual Activity  . Alcohol use: No  . Drug use: No  . Sexual activity: Not Currently    Birth control/protection: Post-menopausal, Surgical  Lifestyle  . Physical activity:    Days per week: Not on file    Minutes per session: Not on file  . Stress: Not on file  Relationships  . Social connections:    Talks on phone: Not on file    Gets together: Not on file    Attends religious service: Not on file    Active member of club or organization: Not on file    Attends meetings of clubs or organizations: Not on file    Relationship status: Not on file  . Intimate partner violence:    Fear of current or ex partner: Not on file    Emotionally abused: Not on file    Physically abused: Not on file    Forced sexual activity: Not  on file  Other Topics Concern  . Not on file  Social History Narrative  . Not on file    REVIEW OF SYSTEMS: Constitutional: No fevers, chills, or sweats, no generalized fatigue, change in appetite Eyes: No visual changes, double vision, eye pain Ear, nose and throat: No hearing loss, ear pain, nasal congestion, sore throat Cardiovascular: No chest pain, palpitations Respiratory:  No shortness of breath at rest or with exertion, wheezes GastrointestinaI: No nausea, vomiting, diarrhea, abdominal pain, fecal incontinence Genitourinary:  No dysuria, urinary retention or frequency Musculoskeletal:  No neck pain, back pain Integumentary: No rash, pruritus, skin lesions Neurological: as above Psychiatric: No depression, insomnia,+ anxiety Endocrine: No palpitations, fatigue, diaphoresis, mood swings, change in appetite, change in weight, increased thirst Hematologic/Lymphatic:  No anemia, purpura, petechiae. Allergic/Immunologic: no itchy/runny eyes, nasal congestion, recent allergic reactions, rashes  PHYSICAL EXAM: Vitals:   08/02/18 0932  BP: 118/72  Pulse: 84  SpO2: 97%   General: No acute distress, verbose with loose associations  Head:  Normocephalic/atraumatic Eyes: Fundoscopic exam shows bilateral sharp discs, no vessel changes, exudates, or hemorrhages Neck: supple, no paraspinal tenderness, full range of motion Back: No paraspinal tenderness Heart: regular rate and rhythm Lungs: Clear to auscultation bilaterally. Vascular: No carotid bruits. Skin/Extremities: No rash, no edema Neurological Exam: Mental status: alert and oriented to person, place, and time, no dysarthria or aphasia, Fund of knowledge is appropriate.  Recent and remote memory are impaired. Attention and concentration are reduced.   Able to name objects and repeat phrases.  Montreal Cognitive Assessment  08/02/2018  Visuospatial/ Executive (0/5) 4  Naming (0/3) 3  Attention: Read list of digits (0/2) 2    Attention: Read list of letters (0/1) 0  Attention: Serial 7 subtraction starting at 100 (0/3) 1  Language: Repeat phrase (0/2) 1  Language : Fluency (0/1) 0  Abstraction (0/2) 0  Delayed Recall (0/5) 2  Orientation (0/6) 6  Total 19  Adjusted Score (based on education) 20   Cranial nerves: CN I: not tested CN II: pupils equal, round and reactive to light, visual fields intact, fundi  unremarkable. CN III, IV, VI:  full range of motion, no nystagmus, no ptosis CN V: facial sensation intact CN VII: upper and lower face symmetric CN VIII: hearing intact to finger rub CN IX, X: gag intact, uvula midline CN XI: sternocleidomastoid and trapezius muscles intact CN XII: tongue midline Bulk & Tone: normal, no fasciculations. Motor: 5/5 throughout with no pronator drift. Sensation: intact to light touch, cold, pin on both UE, decreased cold on left foot, decreased vibration sense to ankles bilaterally. No extinction to double simultaneous stimulation.  Romberg test positive sway Deep Tendon Reflexes: +2 throughout except for absent ankle jerks bilaterally, no ankle clonus Plantar responses: downgoing bilaterally Cerebellar: no incoordination on finger to nose, heel to shin. No dysdiadochokinesia Gait: able to stand with arms crossed over chest, gait testing showed some astasia abasia with difficulty with tandem walk Tremor: none  IMPRESSION: This is a 72 year old right-handed woman with a history of atrial fibrillation on Eliquis, hypothyroidism, anxiety, depression, presenting for evaluation after hospitalization for encephalopathy. She was under the impression that she had a brain infection at that time and wonders is the brain infection is causing her fatigue. Records from her hospitalization in May 2019 were reviewed and it was clarified with the patient that she did not have a brain infection during her hospital admission. She was evaluated by Neurology and had an unremarkable MRI brain,  EEG, and lumbar puncture, diagnosis of altered mental status in the setting of high fever, most likely explanation is viral syndrome with delirium with post-viral labyrinthitis. Her main concern is balance difficulties since hospitalization, she does have mild neuropathy which can cause gait instability, but some gait changes are concerning for astasia abasia, which is more psychogenic. We discussed how stress can cause physical symptoms, she is under a lot of stress taking care of her husband alone. Resources for home aide given today. She does have mild cognitive impairment, MOCA score 20/30, but appears to manage complex tasks overall except for medications. She does not want home health to help at this time, continue to monitor. She will be referred to PT for balance therapy. We discussed advanced care planning, including discussion of patient's goals of care and preferences, future treatment options and decision, and an explanation of advanced directives. She was encouraged to assign POA. Follow-up in 6 months, she knows to call for any changes.   Thank you for allowing me to participate in the care of this patient. Please do not hesitate to call for any questions or concerns.   Ellouise Newer, M.D.  CC: Dr. Lenna Gilford

## 2018-08-03 ENCOUNTER — Encounter: Payer: Self-pay | Admitting: Neurology

## 2018-08-03 ENCOUNTER — Encounter: Payer: Self-pay | Admitting: Internal Medicine

## 2018-08-03 ENCOUNTER — Ambulatory Visit (INDEPENDENT_AMBULATORY_CARE_PROVIDER_SITE_OTHER): Payer: PPO | Admitting: Internal Medicine

## 2018-08-03 VITALS — HR 68 | Temp 98.4°F | Wt 174.0 lb

## 2018-08-03 DIAGNOSIS — E785 Hyperlipidemia, unspecified: Secondary | ICD-10-CM

## 2018-08-03 DIAGNOSIS — E0590 Thyrotoxicosis, unspecified without thyrotoxic crisis or storm: Secondary | ICD-10-CM

## 2018-08-03 DIAGNOSIS — Z23 Encounter for immunization: Secondary | ICD-10-CM | POA: Diagnosis not present

## 2018-08-03 DIAGNOSIS — G3184 Mild cognitive impairment, so stated: Secondary | ICD-10-CM

## 2018-08-03 DIAGNOSIS — M8580 Other specified disorders of bone density and structure, unspecified site: Secondary | ICD-10-CM | POA: Diagnosis not present

## 2018-08-03 DIAGNOSIS — F341 Dysthymic disorder: Secondary | ICD-10-CM

## 2018-08-03 NOTE — Patient Instructions (Signed)
-  It was nice meeting you today!  -Pneumonia and shingles vaccinations today.  -Please arrange visit with our counselor, Dennison Bulla.  -Please schedule follow up with me in 3 months for your annual wellness visit. Come in fasting at that time.

## 2018-08-03 NOTE — Progress Notes (Signed)
Established Patient Office Visit     CC/Reason for Visit: Establish care, follow-up on chronic medical conditions  HPI: Carly Rhodes is a 72 y.o. female who is coming in today for the above mentioned reasons. Past Medical History is significant for: subclinical hyperthyroidism under the care of endocrine going for RAIU scan tomorrow.  She was hospitalized in May 2019 with acute encephalopathy thought to be delirium with post viral labyrinthitis. She had an LP to r/o infection but was on abx empirically. She is insistent that she had a "brain infection".  During that hospitalization she was found to have atrial fibrillation and was started on Eliquis.  Was on amiodarone transiently but that was subsequently discontinued.  Her rate has been well controlled, follows with Dr. Lovena Le, cardiology.  She is very anxious and tearful today.  She states "I got bad news at the neurologist yesterday".  I have reviewed neurology's note from 12/17 in detail.  There is mention of mild cognitive impairment scoring 20 out of 30 in the [facility].  Neurology also believes that stress surrounding her being the sole caregiver for her elderly husband is part of her syndrome.  She seems very overwhelmed and tearful today with her social stressors.  She has no family in town other than a sister who is also elderly and unable to assist.  She is interested in getting pneumonia and shingles vaccinations today.  She is past due for mammogram.  Colonoscopy in 2013, repeat due in 2023.  She is also due for DEXA scan.   Past Medical/Surgical History: Past Medical History:  Diagnosis Date  . Anxiety   . Depression   . Family history of anesthesia complication    sister extreme nausea & vomiting  . 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 Osteoporosis   . Seasonal allergies     Past Surgical History:  Procedure Laterality Date  . ABDOMINAL HYSTERECTOMY  1984  . APPENDECTOMY  1984   at time of Hysterectomy  . HAMMER  TOE SURGERY  1993   2nd toe left foot  . THYROID LOBECTOMY  09/15/2012   Procedure: THYROID LOBECTOMY;  Surgeon: Melissa Montane, MD;  Location: [place];  Service: ENT;  Laterality: Left;  . THYROIDECTOMY, PARTIAL  09/15/2012   Dr Janace Hoard  . TONSILLECTOMY  1957    Social History:  reports that she has never smoked. She has never used smokeless tobacco. She reports that she does not drink alcohol or use drugs.  Allergies: Allergies  Allergen Reactions  . Procaine Other (See Comments)    REACTION:  Lowers blood pressure, "makes me loopy, can't move or talk"    Family History:  Family History  Problem Relation Age of Onset  . Stomach cancer Brother 74  . Colon cancer Paternal Grandmother 110  . Diabetes Father   . Hypertension Sister   . Heart attack Neg Hx      Current Outpatient Medications:  .  acetaminophen (TYLENOL) 325 MG tablet, Take 325-650 mg by mouth every 6 (six) hours as needed for headache (pain)., Disp: , Rfl:  .  acyclovir (ZOVIRAX) 400 MG tablet, Take 1 tablet (400 mg total) by mouth every 6 (six) hours as needed., Disp: 30 tablet, Rfl: 2 .  ALPRAZolam (XANAX) 0.5 MG tablet, TAKE 1/2 to 1  tab three times a day as needed, Disp: 90 tablet, Rfl: 3 .  apixaban (ELIQUIS) 5 MG TABS tablet, Take 1 tablet (5 mg total) by mouth 2 (  two) times daily., Disp: 180 tablet, Rfl: 3 .  Ascorbic Acid (VITAMIN C PO), Take 1 tablet by mouth daily., Disp: , Rfl:  .  b complex vitamins tablet, Take 1 tablet by mouth daily., Disp: , Rfl:  .  baclofen (LIORESAL) 10 MG tablet, Take 1 tablet (10 mg total) by mouth at bedtime., Disp: 30 each, Rfl: 5 .  cyanocobalamin (,VITAMIN B-12,) 1000 MCG/ML injection, Inject 1,000 mcg into the muscle every 30 (thirty) days., Disp: , Rfl:  .  ezetimibe (ZETIA) 10 MG tablet, TAKE 1 TABLET BY MOUTH ONCE DAILY, Disp: 90 tablet, Rfl: 1 .  Multiple Vitamin (MULTIVITAMIN WITH MINERALS) TABS tablet, Take 1 tablet by mouth daily. Centrum, Disp: , Rfl:  .  Omega-3 Fatty  Acids (FISH OIL PO), Take 2 capsules by mouth daily., Disp: , Rfl:  .  tiZANidine (ZANAFLEX) 4 MG capsule, Take 1 capsule (4 mg total) by mouth at bedtime., Disp: 30 capsule, Rfl: 0  Review of Systems:  Constitutional: Denies fever, chills, diaphoresis, appetite change and fatigue.  HEENT: Denies photophobia, eye pain, redness, hearing loss, ear pain, congestion, sore throat, rhinorrhea, sneezing, mouth sores, trouble swallowing, neck pain, neck stiffness and tinnitus.   Respiratory: Denies SOB, DOE, cough, chest tightness,  and wheezing.   Cardiovascular: Denies chest pain, palpitations and leg swelling.  Gastrointestinal: Denies nausea, vomiting, abdominal pain, diarrhea, constipation, blood in stool and abdominal distention.  Genitourinary: Denies dysuria, urgency, frequency, hematuria, flank pain and difficulty urinating.  Endocrine: Denies: hot or cold intolerance, sweats, changes in hair or nails, polyuria, polydipsia. Musculoskeletal: Denies myalgias, back pain, joint swelling, arthralgias and gait problem.  Skin: Denies pallor, rash and wound.  Neurological: Denies dizziness, seizures, syncope, weakness, light-headedness, numbness and headaches.  Hematological: Denies adenopathy. Easy bruising, personal or family bleeding history  Psychiatric/Behavioral: Denies suicidal ideation, mood changes, confusion, nervousness, complains of decreased sleep.   Physical Exam: Vitals:   08/03/18 0716  Pulse: 68  Temp: 98.4 F (36.9 C)  TempSrc: Oral  SpO2: 97%  Weight: 174 lb (78.9 kg)    Body mass index is 28.51 kg/m.   Constitutional: NAD, calm, comfortable Eyes: PERRL, lids and conjunctivae normal ENMT: Mucous membranes are moist.  Neck: normal, supple, no masses, no thyromegaly Respiratory: clear to auscultation bilaterally, no wheezing, no crackles. Normal respiratory effort. No accessory muscle use.  Cardiovascular: Regular rate and rhythm, no murmurs / rubs / gallops. No  extremity edema. 2+ pedal pulses. No carotid bruits.  Abdomen: no tenderness, no masses palpated. No hepatosplenomegaly. Bowel sounds positive.  Musculoskeletal: no clubbing / cyanosis. No joint deformity upper and lower extremities. Good ROM, no contractures. Normal muscle tone.  Skin: no rashes, lesions, ulcers. No induration Neurologic: grossly intact and non-focal Psychiatric: Normal judgment and insight. Alert and oriented x 3. Normal mood.    Impression and Plan:  DYSTHYMIA -She has not been formally diagnosed with depression, but she does have a lot of social stressors that are causing sleep disturbance and tearfulness. -Have advised follow-up with our counselor Mr. Dennison Bulla.  Dyslipidemia -Last LDL was 96 in November 2017. -Will return in 3 months fasting for annual visit, will recheck lipid scan.  Osteopenia, unspecified location -Per DEXA in 2016, due for repeat. Will request.  Mild cognitive impairment -Followed by neurology. -She has no family in town. -We will request home health to assist with power of attorney paperwork and to see what else is available for support in regards to her being the sole caregiver to  her elderly husband.  Subclinical hyperthyroidism -Followed by endocrinology, for radioactive iodine uptake scan tomorrow.  Atrial fibrillation -She is currently in sinus rhythm, is anticoagulated on Eliquis.  Health Maintenance -She has been given information to schedule her mammogram. -Will order repeat DEXA today. -She will receive PNA immunization and shingles at her request today.    Patient Instructions  -It was nice meeting you today!  -Pneumonia and shingles vaccinations today.  -Please arrange visit with our counselor, Dennison Bulla.  -Please schedule follow up with me in 3 months for your annual wellness visit. Come in fasting at that time.     Lelon Frohlich, MD [facility]

## 2018-08-04 ENCOUNTER — Other Ambulatory Visit: Payer: Self-pay | Admitting: Internal Medicine

## 2018-08-04 ENCOUNTER — Encounter (HOSPITAL_COMMUNITY)
Admission: RE | Admit: 2018-08-04 | Discharge: 2018-08-04 | Disposition: A | Discharge status: Home / Self Care | Payer: PPO | Source: Ambulatory Visit | Attending: Internal Medicine | Admitting: Internal Medicine

## 2018-08-04 DIAGNOSIS — R7989 Other specified abnormal findings of blood chemistry: Secondary | ICD-10-CM

## 2018-08-04 DIAGNOSIS — Z1231 Encounter for screening mammogram for malignant neoplasm of breast: Secondary | ICD-10-CM

## 2018-08-04 MED ORDER — SODIUM IODIDE I 131 CAPSULE
15.9000 | Freq: Once | INTRAVENOUS | Status: AC | PRN
  Administered 2018-08-04: 15.9 via ORAL

## 2018-08-05 ENCOUNTER — Encounter (HOSPITAL_COMMUNITY)
Admission: RE | Admit: 2018-08-05 | Discharge: 2018-08-05 | Disposition: A | Discharge status: Home / Self Care | Payer: PPO | Source: Ambulatory Visit | Attending: Internal Medicine | Admitting: Internal Medicine

## 2018-08-05 DIAGNOSIS — E039 Hypothyroidism, unspecified: Secondary | ICD-10-CM | POA: Diagnosis not present

## 2018-08-11 ENCOUNTER — Ambulatory Visit (INDEPENDENT_AMBULATORY_CARE_PROVIDER_SITE_OTHER): Payer: PPO | Admitting: Internal Medicine

## 2018-08-11 ENCOUNTER — Ambulatory Visit: Payer: PPO

## 2018-08-11 ENCOUNTER — Encounter: Payer: Self-pay | Admitting: Internal Medicine

## 2018-08-11 VITALS — BP 118/62 | HR 78 | Ht 65.5 in | Wt 176.0 lb

## 2018-08-11 DIAGNOSIS — E064 Drug-induced thyroiditis: Secondary | ICD-10-CM

## 2018-08-11 DIAGNOSIS — T462X5A Adverse effect of other antidysrhythmic drugs, initial encounter: Secondary | ICD-10-CM | POA: Diagnosis not present

## 2018-08-11 LAB — T4, FREE: Free T4: 0.97 ng/dL (ref 0.60–1.60)

## 2018-08-11 LAB — TSH: TSH: 0.81 u[IU]/mL (ref 0.35–4.50)

## 2018-08-11 NOTE — Progress Notes (Signed)
Name: Carly Rhodes  MRN/ DOB: [id number], 31-Jul-1946    Age/ Sex: 71 y.o., female     PCP: Carly Rhodes, Carly Halsted, MD   Reason for Endocrinology Evaluation: Low TSH      Initial Endocrinology Clinic Visit: 06/29/18    PATIENT IDENTIFIER: [name] is a 72 y.o., female with a past medical history of A. Fib, low bone density, HTN  and anxiety  . She has followed with [facility] since 06/29/18 for consultative assistance with management of her low TSH    HISTORICAL SUMMARY:  She has a Hx of left thyroid lobectomy in 2014 due to Carly Rhodes. With benign pathology.  She had a hospital admission in May, 2019 for encephalopathy and was noted ot have new onset A.Fib. She was discharged on amiodarone at the time.   Amiodarone was stopped in August, 2019.     SUBJECTIVE:     Today (08/11/2018):  [name] is here for a 6 week follow up on low TSH.  She denies weight loss, diarrhea or heat intolerance.  She denies local neck symptoms.  She has been very tired.   She is very stressed and continues to take care of her 25 yr old husband who had a hip and a spine fracture.  She also continues to see the neurologist for her abnormal gait , that started following hospitalization for encephalopathy .    ROS:  As per HPI.   HISTORY:  Past Medical History:  Past Medical History:  Diagnosis Date  . Anxiety   . Depression   . Family history of anesthesia complication    sister extreme nausea & vomiting  . 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 Osteoporosis   . Seasonal allergies    Past Surgical History:  Past Surgical History:  Procedure Laterality Date  . ABDOMINAL HYSTERECTOMY  1984  . APPENDECTOMY  1984   at time of Hysterectomy  . HAMMER TOE SURGERY  1993   2nd toe left foot  . THYROID LOBECTOMY  09/15/2012   Procedure: THYROID LOBECTOMY;  Surgeon: Carly Montane, MD;  Location: [facility];  Service: ENT;  Laterality: Left;  . THYROIDECTOMY,  PARTIAL  09/15/2012   Dr Carly Rhodes  . TONSILLECTOMY  1957    Social History:  reports that she has never smoked. She has never used smokeless tobacco. She reports that she does not drink alcohol or use drugs.  Family History: family history includes Colon cancer (age of onset: 38) in her paternal grandmother; Diabetes in her father; Hypertension in her sister; Stomach cancer (age of onset: 56) in her brother.   HOME MEDICATIONS:     OBJECTIVE:   PHYSICAL EXAM: VS: BP 118/62 (BP Location: Right Arm, Patient Position: Sitting, Cuff Size: Normal)   Pulse 78   Ht 5' 5.5" (1.664 m)   Wt 176 lb (79.8 kg)   SpO2 99%   BMI 28.84 kg/m    EXAM: General: Pt appears well and is in NAD  Neck: General: Supple without adenopathy. Thyroid: Thyroid size normal.  No goiter or nodules appreciated. No thyroid bruit.  Lungs: Clear with good BS bilat with no rales, rhonchi, or wheezes  Heart: Auscultation: RRR.  Abdomen: Normoactive bowel sounds, soft, nontender, without masses or organomegaly palpable  Extremities:  BL LE: No pretibial edema normal ROM and strength.  Skin: Hair: Texture and amount normal with gender appropriate distribution Skin Inspection: No rashes. Skin Palpation: Skin temperature, texture, and thickness normal  to palpation  Neuro: Cranial nerves: II - XII grossly intact  Motor: Normal strength throughout DTRs: 2+ and symmetric in UE without delay in relaxation phase  Mental Status: Judgment, insight: Intact Orientation: Oriented to time, place, and person Mood and affect: No depression, anxiety, or agitation     DATA REVIEWED:  Results for Carly, Rhodes (MRN [id number]) as of 08/11/2018 07:48  Ref. Range 05/17/2018 10:40 05/19/2018 11:49  TSH Latest Ref Range: 0.35 - 4.50 uIU/mL 0.24 (L) 0.22 (L)   Thyroid ultrasound (05/26/2018) There are no nodules which meet criteria for biopsy nor follow-up. The gland is markedly heterogeneous.   Thyroid Uptake and Scan  (08/05/2018) Poorly defined RIGHT thyroid lobe is seen.  Suboptimal LEFT thyroid uptake with questionable extension of inferior LEFT thyroid lobe into the superior mediastinum.  No LEFT lobe thyroid mass/nodule identified on recent ultrasound.  4 hour I-131 uptake = 2.4% (normal 5-20%)  24 hour I-131 uptake = 4.0% (normal 10-30%)  IMPRESSION: Significantly decreased 4 hour and 24 hour radio iodine uptakes as above.  Very poor uptake of tracer within thyroid tissue on imaging, precluding diagnostic scintigraphic imaging.  Unable to exclude extension of LEFT thyroid lobe into superior mediastinum. ASSESSMENT / PLAN / RECOMMENDATIONS:   1. Amiodarone - Induced thyroid disease:   Plan:  She is clinically euthyroid   She denies any local neck symptoms.   We discussed the results of thyroid uptake and scan, which is most likely related to amiodarone use. I explained to her that in the setting of A.Fib, Amiodarone was lifesaving for her. She has been off since August, 2019.  We discussed that there are two types of Amiodarone- Induced thyroid dysfunction. In type I , there's increase synthesis of thyroid hormone, treated with thionamides, where as in type II, there's excess release of T4 and T3 due to destructive thyroiditis, and this is typically treated with prednisone. Sometimes its clinically difficult to differentiate between the two types and patient may be put on both thionamide therapy as well as glucocorticoids.   Repeat TFT's today show normalization , no further treatment needed at this time.    F/U in 8 weeks    Signed electronically by: Carly Guise, MD  [facility] End[facility]  [facility] [address] [place], [place] [address] Phone: [phone] FAX: [phone]      CC: Carly Rhodes, Carly Halsted, MD [place] [address] Phone: [phone]  Fax: [phone]   Return to  Endocrinology clinic as below: Future Appointments  Date Time Provider [place]  08/11/2018  8:45 AM [place] None  09/08/2018  8:00 AM [facility] MM 2 GI-[facility]GMM [facility] CE  11/02/2018  7:00 AM Erline Hau, MD [facility] PEC  03/06/2019 10:00 AM Cameron Sprang, MD [facility] None

## 2018-08-12 DIAGNOSIS — E064 Drug-induced thyroiditis: Secondary | ICD-10-CM | POA: Insufficient documentation

## 2018-08-12 DIAGNOSIS — T462X5A Adverse effect of other antidysrhythmic drugs, initial encounter: Principal | ICD-10-CM

## 2018-09-06 ENCOUNTER — Ambulatory Visit: Payer: PPO | Admitting: Internal Medicine

## 2018-09-08 ENCOUNTER — Ambulatory Visit: Payer: PPO

## 2018-09-09 ENCOUNTER — Ambulatory Visit (INDEPENDENT_AMBULATORY_CARE_PROVIDER_SITE_OTHER): Payer: PPO | Admitting: Internal Medicine

## 2018-09-09 DIAGNOSIS — E538 Deficiency of other specified B group vitamins: Secondary | ICD-10-CM | POA: Diagnosis not present

## 2018-09-09 MED ORDER — CYANOCOBALAMIN 1000 MCG/ML IJ SOLN
1000.0000 ug | Freq: Once | INTRAMUSCULAR | Status: AC
  Administered 2018-09-09: 1000 ug via INTRAMUSCULAR

## 2018-09-13 ENCOUNTER — Ambulatory Visit: Payer: PPO | Admitting: Sports Medicine

## 2018-09-13 VITALS — BP 115/78

## 2018-09-13 DIAGNOSIS — R2681 Unsteadiness on feet: Secondary | ICD-10-CM

## 2018-09-13 NOTE — Progress Notes (Signed)
   HPI  CC: Balance issues  Carly Rhodes is a 73 year old female who presents for balance issues.  She states that she was hospitalized on May 27 of last year.  This was due to encephalopathy.  She states that after this hospitalization she was having some unsteadiness on her feet.  She states she has not had any falls but it felt off balance since that time.  She says that this gets worse anytime she feels upset, stressed, or in a hurry.  She states during this time she feels like she cannot balance.  She has seen a neurologist since that time and was told she may have neuropathy in her feet.  EEG, and MRI were negative for any acute findings around the time.  She denies any numbness or tingling in her feet.  She denies any weakness in her feet.  She states is mostly a balance issue.  She states she is tried no medications, and no physical therapy for relief.  She has not found anything to make it better at this time.  See HPI and/or previous note for associated ROS.  Objective: BP 115/78  Gen: Right-Hand Dominant. NAD, well groomed, a/o x3, normal affect.  CV: Well-perfused. Warm.  Resp: Non-labored.  Neuro: Sensation intact throughout. No gross coordination deficits.  Gait: Nonpathologic posture, unremarkable stride without signs of limp or balance issues.  Bilateral leg exam: No erythema, warmth, swelling noted.  No tenderness palpation on exam.  Full range of motion hip flexion extension.  Full range of motion in dorsiflexion, plantarflexion, eversion, inversion of the ankle.  Strength 5 out of 5 throughout all lower extremity testing.  Negative logroll, negative Faber test, negative FADIR test.  Negative straight leg raise.  Neuro exam: 2+ patellar reflex.  2+ Achilles reflex.  Cranial nerves V through XII intact.  Negative Romberg test, negative finger-to-nose test, negative heel-to-shin test  1 leg balance tests show only mild imbalance;  With eyes closed she is unsteady without holding  wall.  Assessment and plan: Gait instability.  She has seen neurology in the past and this is presumed to be secondary to recent encephalopathy versus psychogenic nature.  We discussed treatment options at today's visit.  It was recommended she go to physical therapy by her neurologist, but she does not feel she has a time to do this due to taking care of her husband.  We will provide her with some balance exercises at today's visit.  She should be doing these daily at home to work on balance with her gait.  We will see her back in follow-up in 4 weeks.  I do recommend she continues to follow-up with neurology.  Lewanda Rife, MD [facility] Fellow 09/13/2018 1:36 PM  I observed and examined the patient with the [facility] Fellow and agree with assessment and plan.  Note reviewed and modified by me. Ila Mcgill, MD

## 2018-09-13 NOTE — Patient Instructions (Signed)
After your infection you had issues with memory and balance  Memory is improving Music is helpful in concentration and balance Reading for memory  Balance exercises 1 foot standing 5 times 5 secs on each foot 1 foot standing with hand on wall for balance and close your eyes Do this 5 time on each foot  Stand next to table use hand for balance Stand on 1 foot and bend knee slightly 5 times Do this on each leg  As your stress gets less you will steadily get better  Talk to your new physician about a home health visit to check on Paul's stability and see if you can get some help for respite

## 2018-09-16 NOTE — Progress Notes (Signed)
b12 injection Domingo Mend, MD

## 2018-09-16 NOTE — Addendum Note (Signed)
Addended by: Lelon Frohlich Y on: 09/16/2018 11:15 AM   Modules accepted: Level of Service

## 2018-09-21 ENCOUNTER — Encounter: Payer: Self-pay | Admitting: Internal Medicine

## 2018-09-21 ENCOUNTER — Ambulatory Visit (INDEPENDENT_AMBULATORY_CARE_PROVIDER_SITE_OTHER): Payer: PPO | Admitting: Internal Medicine

## 2018-09-21 VITALS — BP 114/72 | HR 72 | Ht 66.0 in | Wt 176.8 lb

## 2018-09-21 DIAGNOSIS — T462X5A Adverse effect of other antidysrhythmic drugs, initial encounter: Secondary | ICD-10-CM | POA: Diagnosis not present

## 2018-09-21 DIAGNOSIS — E064 Drug-induced thyroiditis: Secondary | ICD-10-CM

## 2018-09-21 LAB — TSH: TSH: 0.35 u[IU]/mL (ref 0.35–4.50)

## 2018-09-21 LAB — T4, FREE: FREE T4: 0.97 ng/dL (ref 0.60–1.60)

## 2018-09-21 NOTE — Progress Notes (Signed)
Name: Carly Rhodes  MRN/ DOB: [id number], 01/21/1946    Age/ Sex: 73 y.o., female     PCP: Isaac Bliss, Rayford Halsted, MD   Reason for Endocrinology Evaluation: Low TSH      Initial Endocrinology Clinic Visit: 06/29/18    PATIENT IDENTIFIER: Carly Rhodes is a 73 y.o., female with a past medical history of A. Fib, low bone density, HTN  and anxiety  . She has followed with [facility] since 06/29/18 for consultative assistance with management of her low TSH    HISTORICAL SUMMARY:  She has a Hx of left thyroid lobectomy in 2014 due to [place]. With benign pathology.  She had a hospital admission in May, 2019 for encephalopathy and was noted ot have new onset A.Fib. She was discharged on amiodarone at the time.   Amiodarone was stopped in August, 2019.     SUBJECTIVE:     Today (09/21/2018):  Carly Rhodes is here for a 6 week follow up on low TSH.  She denies weight loss, diarrhea or heat intolerance.  She denies local neck symptoms.  She relates that she has been taking biotin which could interfere with thyroid function testing.  She continues to see the neurologist for her abnormal gait , that started following hospitalization for encephalopathy .     ROS:  As per HPI.   HISTORY:  Past Medical History:  Past Medical History:  Diagnosis Date  . Anxiety   . Depression   . Family history of anesthesia complication    sister extreme nausea & vomiting  . 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 Osteoporosis   . Seasonal allergies    Past Surgical History:  Past Surgical History:  Procedure Laterality Date  . ABDOMINAL HYSTERECTOMY  1984  . APPENDECTOMY  1984   at time of Hysterectomy  . HAMMER TOE SURGERY  1993   2nd toe left foot  . THYROID LOBECTOMY  09/15/2012   Procedure: THYROID LOBECTOMY;  Surgeon: Melissa Montane, MD;  Location: [facility];  Service: ENT;  Laterality: Left;  . THYROIDECTOMY, PARTIAL  09/15/2012   Dr Janace Hoard  . TONSILLECTOMY   1957    Social History:  reports that she has never smoked. She has never used smokeless tobacco. She reports that she does not drink alcohol or use drugs.  Family History: family history includes Colon cancer (age of onset: 41) in her paternal grandmother; Diabetes in her father; Hypertension in her sister; Stomach cancer (age of onset: 13) in her brother.   HOME MEDICATIONS: Allergies as of 09/21/2018      Reactions   Amiodarone Other (See Comments)   Hair loss   Procaine Other (See Comments)   REACTION:  Lowers blood pressure, "makes me loopy, can't move or talk"      Medication List       Accurate as of September 21, 2018  9:36 PM. Always use your most recent med list.        acetaminophen 325 MG tablet Commonly known as:  TYLENOL Take 325-650 mg by mouth every 6 (six) hours as needed for headache (pain).   acyclovir 400 MG tablet Commonly known as:  ZOVIRAX Take 1 tablet (400 mg total) by mouth every 6 (six) hours as needed.   ALPRAZolam 0.5 MG tablet Commonly known as:  XANAX TAKE 1/2 to 1  tab three times a day as needed   apixaban 5 MG Tabs tablet Commonly known as:  ELIQUIS  Take 1 tablet (5 mg total) by mouth 2 (two) times daily.   b complex vitamins tablet Take 1 tablet by mouth daily.   baclofen 10 MG tablet Commonly known as:  LIORESAL Take 1 tablet (10 mg total) by mouth at bedtime.   cyanocobalamin 1000 MCG/ML injection Commonly known as:  (VITAMIN B-12) Inject 1,000 mcg into the muscle every 30 (thirty) days.   ezetimibe 10 MG tablet Commonly known as:  ZETIA TAKE 1 TABLET BY MOUTH ONCE DAILY   FISH OIL PO Take 2 capsules by mouth daily.   multivitamin with minerals Tabs tablet Take 1 tablet by mouth daily. Centrum   tiZANidine 4 MG capsule Commonly known as:  ZANAFLEX Take 1 capsule (4 mg total) by mouth at bedtime.   VITAMIN C PO Take 1 tablet by mouth daily.        OBJECTIVE:   PHYSICAL EXAM: VS: BP 114/72 (BP Location: Left  Arm, Patient Position: Sitting, Cuff Size: Normal)   Pulse 72   Ht 5\' 6"  (1.676 m)   Wt 176 lb 12.8 oz (80.2 kg)   SpO2 96%   BMI 28.54 kg/m    EXAM: General: Pt appears well and is in NAD  Neck: General: Supple without adenopathy. Thyroid: Thyroid size normal.  No goiter or nodules appreciated. No thyroid bruit.  Lungs: Clear with good BS bilat with no rales, rhonchi, or wheezes  Heart: Auscultation: RRR.  Abdomen: Normoactive bowel sounds, soft, nontender, without masses or organomegaly palpable  Extremities:  BL LE: No pretibial edema normal ROM and strength.  Mental Status: Judgment, insight: Intact Orientation: Oriented to time, place, and person Mood and affect: No depression, anxiety, or agitation     DATA REVIEWED:  Results for Carly, Rhodes (MRN [id number]) as of 09/21/2018 21:34  Ref. Range 05/19/2018 11:49 08/11/2018 08:34 09/21/2018 11:51  TSH Latest Ref Range: 0.35 - 4.50 uIU/mL 0.22 (L) 0.81 0.35  Triiodothyronine,Free,Serum Latest Ref Range: 2.3 - 4.2 pg/mL 3.2    T4,Free(Direct) Latest Ref Range: 0.60 - 1.60 ng/dL 1.11 0.97 0.97     ASSESSMENT / PLAN / RECOMMENDATIONS:   1. Amiodarone - Induced thyroid disease:   Plan:  She is clinically euthyroid and biochemically euthyroid  She denies any local neck symptoms.   She has been off since August, 2019.  We discussed that there are two types of Amiodarone- Induced thyroid dysfunction. In type I , there's increase synthesis of thyroid hormone, treated with thionamides, where as in type II, there's excess release of T4 and T3 due to destructive thyroiditis, and this is typically treated with prednisone. Sometimes its clinically difficult to differentiate between the two types and patient may be put on both thionamide therapy as well as glucocorticoids.   Patient realized that she has been on biotin, biotin may interfere with TFTs.   She was advised to hold biotin intake for at least 72 hours prior to future  thyroid function testing.   F/U PRN   Signed electronically by: Mack Guise, MD  [facility] Endocrin[facility]  [facility] [address] [place], [place] [address] Phone: [phone] FAX: [phone]      CC: Isaac Bliss, Rayford Halsted, MD [place] [address] Phone: [phone]  Fax: [phone]   Return to Endocrinology clinic as below: Future Appointments  Date Time Provider [place]  10/06/2018  8:20 AM [facility]G MM 3 GI-BCGMM [facility]  10/11/2018  8:30 AM [facility]-NURSE [facility] P[facility]  10/31/2018  8:30 AM [facility] DX DEXA 1 GI-BCGDG [facility] CE  11/02/2018  7:00 AM Isaac Bliss, Rayford Halsted, MD [facility]  01/03/2019  9:45 AM Evans Lance, MD [facility]  03/06/2019 10:00 AM Cameron Sprang, MD [facility] None

## 2018-09-21 NOTE — Patient Instructions (Signed)
-   Please stop by the lab today and we will contact you about the results.  - Please remember in the future to hold the Biotin for 3 days prior to thyroid blood tests.

## 2018-09-22 ENCOUNTER — Encounter: Payer: Self-pay | Admitting: Internal Medicine

## 2018-10-06 ENCOUNTER — Ambulatory Visit
Admission: RE | Admit: 2018-10-06 | Discharge: 2018-10-06 | Disposition: A | Discharge status: Home / Self Care | Payer: PPO | Source: Ambulatory Visit | Attending: Internal Medicine | Admitting: Internal Medicine

## 2018-10-06 DIAGNOSIS — Z1231 Encounter for screening mammogram for malignant neoplasm of breast: Secondary | ICD-10-CM

## 2018-10-11 ENCOUNTER — Ambulatory Visit (INDEPENDENT_AMBULATORY_CARE_PROVIDER_SITE_OTHER): Payer: PPO | Admitting: *Deleted

## 2018-10-11 DIAGNOSIS — Z23 Encounter for immunization: Secondary | ICD-10-CM

## 2018-10-11 DIAGNOSIS — E538 Deficiency of other specified B group vitamins: Secondary | ICD-10-CM

## 2018-10-11 MED ORDER — CYANOCOBALAMIN 1000 MCG/ML IJ SOLN
1000.0000 ug | Freq: Once | INTRAMUSCULAR | Status: AC
  Administered 2018-10-11: 1000 ug via INTRAMUSCULAR

## 2018-10-11 NOTE — Progress Notes (Signed)
Per orders of Dr. Jerilee Hoh, injection of Vit B12 and Shingrix #2 given by Virl Cagey. Patient tolerated injection well.   Pt was given both injections - Pt c/o severe pain in areas of injections. Pt stated that the B12 burned and I massaged the injection site to relieve the discomfort - pt was okay after the first injection. During the 2nd injection of Shingirx the pt Became very agitated and aggressive, yelling and stating that I was hurting her. With the needle in her arm and 1/2 the medication injected patient tried to get away from me and I asked her to please sit still so that I could get the needle out of her arm. I pushed the other 1/2 of medication and removed the needle. At that point the patient pushed by me and went across the room yelling for me to "stay away, dont touch me!" I offered to wipe her arm she was slightly bleeding from the injection site, pt continued yelling and I immediately exited the room and got other Nurses and front off Manager to come in and help calm the patient. The Injections were given properly, right site, so we are unsure what caused the outburst. I retrieved an ice pack and gave it to the patient as she requested, she exited the exam room to the lobby where she continued talking to front staff until she left.

## 2018-10-14 ENCOUNTER — Ambulatory Visit: Payer: PPO | Admitting: Internal Medicine

## 2018-10-25 ENCOUNTER — Telehealth: Payer: Self-pay | Admitting: Internal Medicine

## 2018-10-25 NOTE — Telephone Encounter (Signed)
Copied from [place] [id number]. Topic: General - Other >> Oct 25, 2018  2:07 PM Keene Breath wrote: Reason for CRM: Otila Kluver, with [employer], called to request an [facility] # for the patient's Shingrix vaccine.  Patient is requesting reimbursement but she needs that [facility] number in order to have the request processed.  Please advise and call back at [phone]

## 2018-10-25 NOTE — Telephone Encounter (Signed)
This has been handled. Nothing further needed.

## 2018-10-31 ENCOUNTER — Other Ambulatory Visit: Payer: Self-pay

## 2018-10-31 ENCOUNTER — Ambulatory Visit
Admission: RE | Admit: 2018-10-31 | Discharge: 2018-10-31 | Disposition: A | Discharge status: Home / Self Care | Payer: PPO | Source: Ambulatory Visit | Attending: Internal Medicine | Admitting: Internal Medicine

## 2018-10-31 DIAGNOSIS — M8589 Other specified disorders of bone density and structure, multiple sites: Secondary | ICD-10-CM | POA: Diagnosis not present

## 2018-10-31 DIAGNOSIS — M8580 Other specified disorders of bone density and structure, unspecified site: Secondary | ICD-10-CM

## 2018-10-31 DIAGNOSIS — Z780 Asymptomatic menopausal state: Secondary | ICD-10-CM | POA: Diagnosis not present

## 2018-11-02 ENCOUNTER — Encounter: Payer: Self-pay | Admitting: Internal Medicine

## 2018-11-02 ENCOUNTER — Other Ambulatory Visit: Payer: Self-pay

## 2018-11-02 ENCOUNTER — Ambulatory Visit (INDEPENDENT_AMBULATORY_CARE_PROVIDER_SITE_OTHER): Payer: PPO | Admitting: Internal Medicine

## 2018-11-02 VITALS — BP 110/70 | HR 72 | Temp 98.3°F | Wt 173.6 lb

## 2018-11-02 DIAGNOSIS — Z0000 Encounter for general adult medical examination without abnormal findings: Secondary | ICD-10-CM

## 2018-11-02 DIAGNOSIS — E0590 Thyrotoxicosis, unspecified without thyrotoxic crisis or storm: Secondary | ICD-10-CM

## 2018-11-02 DIAGNOSIS — E785 Hyperlipidemia, unspecified: Secondary | ICD-10-CM

## 2018-11-02 DIAGNOSIS — F341 Dysthymic disorder: Secondary | ICD-10-CM

## 2018-11-02 DIAGNOSIS — M545 Low back pain, unspecified: Secondary | ICD-10-CM

## 2018-11-02 LAB — COMPREHENSIVE METABOLIC PANEL
ALT: 42 U/L — ABNORMAL HIGH (ref 0–35)
AST: 21 U/L (ref 0–37)
Albumin: 4.3 g/dL (ref 3.5–5.2)
Alkaline Phosphatase: 98 U/L (ref 39–117)
BUN: 19 mg/dL (ref 6–23)
CO2: 30 mEq/L (ref 19–32)
CREATININE: 0.86 mg/dL (ref 0.40–1.20)
Calcium: 9.7 mg/dL (ref 8.4–10.5)
Chloride: 104 mEq/L (ref 96–112)
GFR: 64.48 mL/min (ref >60.00)
Glucose, Bld: 97 mg/dL (ref 70–99)
Potassium: 4 mEq/L (ref 3.5–5.1)
Sodium: 142 mEq/L (ref 135–145)
Total Bilirubin: 0.6 mg/dL (ref 0.2–1.2)
Total Protein: 6.6 g/dL (ref 6.0–8.3)

## 2018-11-02 LAB — CBC WITH DIFFERENTIAL/PLATELET
BASOS ABS: 0.1 10*3/uL (ref 0.0–0.1)
Basophils Relative: 1.6 % (ref 0.0–3.0)
EOS ABS: 0.2 10*3/uL (ref 0.0–0.7)
Eosinophils Relative: 2.5 % (ref 0.0–5.0)
HCT: 41.1 % (ref 36.0–46.0)
Hemoglobin: 13.9 g/dL (ref 12.0–15.0)
Lymphocytes Relative: 38.1 % (ref 12.0–46.0)
Lymphs Abs: 2.3 10*3/uL (ref 0.7–4.0)
MCHC: 33.8 g/dL (ref 30.0–36.0)
MCV: 84.5 fl (ref 78.0–100.0)
Monocytes Absolute: 0.4 10*3/uL (ref 0.1–1.0)
Monocytes Relative: 7 % (ref 3.0–12.0)
Neutro Abs: 3.1 10*3/uL (ref 1.4–7.7)
Neutrophils Relative %: 50.8 % (ref 43.0–77.0)
Platelets: 221 10*3/uL (ref 150.0–400.0)
RBC: 4.87 Mil/uL (ref 3.87–5.11)
RDW: 13.7 % (ref 11.5–15.5)
WBC: 6.1 10*3/uL (ref 4.0–10.5)

## 2018-11-02 LAB — LIPID PANEL
Cholesterol: 178 mg/dL (ref 0–200)
HDL: 39.3 mg/dL (ref >39.00)
NonHDL: 138.65
Total CHOL/HDL Ratio: 5
Triglycerides: 266 mg/dL — ABNORMAL HIGH (ref 0.0–149.0)
VLDL: 53.2 mg/dL — ABNORMAL HIGH (ref 0.0–40.0)

## 2018-11-02 LAB — LDL CHOLESTEROL, DIRECT: Direct LDL: 102 mg/dL

## 2018-11-02 LAB — TSH: TSH: 0.74 u[IU]/mL (ref 0.35–4.50)

## 2018-11-02 MED ORDER — BACLOFEN 10 MG PO TABS: 10.0000 mg | ORAL_TABLET | Freq: Every day | ORAL | 1 refills | Status: DC

## 2018-11-02 MED ORDER — TIZANIDINE HCL 4 MG PO CAPS: 4.0000 mg | ORAL_CAPSULE | Freq: Every day | ORAL | 1 refills | Status: DC

## 2018-11-02 MED ORDER — EZETIMIBE 10 MG PO TABS: 10.0000 mg | ORAL_TABLET | Freq: Every day | ORAL | 1 refills | Status: DC

## 2018-11-02 MED ORDER — ALPRAZOLAM 0.5 MG PO TABS: ORAL_TABLET | ORAL | 1 refills | Status: DC

## 2018-11-02 NOTE — Patient Instructions (Signed)
-Nice seeing you today!!  -Lab work today, will notify you with results.   Preventive Care 73 Years and Older, Female Preventive care refers to lifestyle choices and visits with your health care provider that can promote health and wellness. What does preventive care include?  A yearly physical exam. This is also called an annual well check.  Dental exams once or twice a year.  Routine eye exams. Ask your health care provider how often you should have your eyes checked.  Personal lifestyle choices, including: ? Daily care of your teeth and gums. ? Regular physical activity. ? Eating a healthy diet. ? Avoiding tobacco and drug use. ? Limiting alcohol use. ? Practicing safe sex. ? Taking low-dose aspirin every day. ? Taking vitamin and mineral supplements as recommended by your health care provider. What happens during an annual well check? The services and screenings done by your health care provider during your annual well check will depend on your age, overall health, lifestyle risk factors, and family history of disease. Counseling Your health care provider may ask you questions about your:  Alcohol use.  Tobacco use.  Drug use.  Emotional well-being.  Home and relationship well-being.  Sexual activity.  Eating habits.  History of falls.  Memory and ability to understand (cognition).  Work and work Statistician.  Reproductive health.  Screening You may have the following tests or measurements:  Height, weight, and BMI.  Blood pressure.  Lipid and cholesterol levels. These may be checked every 5 years, or more frequently if you are over 71 years old.  Skin check.  Lung cancer screening. You may have this screening every year starting at age 17 if you have a 30-pack-year history of smoking and currently smoke or have quit within the past 15 years.  Colorectal cancer screening. All adults should have this screening starting at age 61 and continuing until  age 3. You will have tests every 1-10 years, depending on your results and the type of screening test. People at increased risk should start screening at an earlier age. Screening tests may include: ? Guaiac-based fecal occult blood testing. ? Fecal immunochemical test (FIT). ? Stool DNA test. ? Virtual colonoscopy. ? Sigmoidoscopy. During this test, a flexible tube with a tiny camera (sigmoidoscope) is used to examine your rectum and lower colon. The sigmoidoscope is inserted through your anus into your rectum and lower colon. ? Colonoscopy. During this test, a long, thin, flexible tube with a tiny camera (colonoscope) is used to examine your entire colon and rectum.  Hepatitis C blood test.  Hepatitis B blood test.  Sexually transmitted disease (STD) testing.  Diabetes screening. This is done by checking your blood sugar (glucose) after you have not eaten for a while (fasting). You may have this done every 1-3 years.  Bone density scan. This is done to screen for osteoporosis. You may have this done starting at age 3.  Mammogram. This may be done every 1-2 years. Talk to your health care provider about how often you should have regular mammograms. Talk with your health care provider about your test results, treatment options, and if necessary, the need for more tests. Vaccines Your health care provider may recommend certain vaccines, such as:  Influenza vaccine. This is recommended every year.  Tetanus, diphtheria, and acellular pertussis (Tdap, Td) vaccine. You may need a Td booster every 10 years.  Varicella vaccine. You may need this if you have not been vaccinated.  Zoster vaccine. You may need this  after age 22.  Measles, mumps, and rubella (MMR) vaccine. You may need at least one dose of MMR if you were born in 1957 or later. You may also need a second dose.  Pneumococcal 13-valent conjugate (PCV13) vaccine. One dose is recommended after age 26.  Pneumococcal  polysaccharide (PPSV23) vaccine. One dose is recommended after age 20.  Meningococcal vaccine. You may need this if you have certain conditions.  Hepatitis A vaccine. You may need this if you have certain conditions or if you travel or work in places where you may be exposed to hepatitis A.  Hepatitis B vaccine. You may need this if you have certain conditions or if you travel or work in places where you may be exposed to hepatitis B.  Haemophilus influenzae type b (Hib) vaccine. You may need this if you have certain conditions. Talk to your health care provider about which screenings and vaccines you need and how often you need them. This information is not intended to replace advice given to you by your health care provider. Make sure you discuss any questions you have with your health care provider. Document Released: 08/30/2015 Document Revised: 09/23/2017 Document Reviewed: 06/04/2015 Elsevier Interactive Patient Education  2019 [employer].

## 2018-11-02 NOTE — Progress Notes (Signed)
Established Patient Office Visit     CC/Reason for Visit: CPE/AWV  HPI: Carly Rhodes is a 73 y.o. female who is coming in today for the above mentioned reasons. Past Medical History is significant UMP:[id number] thyroiditis, B12 deficiency. She was hospitalized in May 2019 with acute encephalopathy thought to be delirium with post viral labyrinthitis. She had an LP to r/o infection but was on abx empirically. She is insistent that she had a "brain infection".  During that hospitalization she was found to have atrial fibrillation and was started on Eliquis.  She sees neurology with a dx of mild cognitive impairment. She continues to complain of being tired. 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 We have talked home assistance today and she will think about it. She takes xanax 0.5 mg (0.5-1 tab per night) and baclofen 10 mg to help with sleep that was prescribed by previous PCP. She wants to make sure that she can continue to get these.  We have discussed routine eye and dental (she does not have these)  UTD on immunizations including shingles; she is close to aging out of routine cancer screens. She had a colonoscopy in 2013, mammogram in 2/19.  Past Medical/Surgical History: Past Medical History:  Diagnosis Date  . Anxiety   . Depression   . Family history of anesthesia complication    sister extreme nausea & vomiting  . 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 Osteoporosis   . Seasonal allergies     Past Surgical History:  Procedure Laterality Date  . ABDOMINAL HYSTERECTOMY  1984  . APPENDECTOMY  1984   at time of Hysterectomy  . HAMMER TOE SURGERY  1993   2nd toe left foot  . THYROID LOBECTOMY  09/15/2012   Procedure: THYROID LOBECTOMY;  Surgeon: Melissa Montane, MD;  Location: [facility];  Service: ENT;  Laterality: Left;  . THYROIDECTOMY, PARTIAL  09/15/2012   Dr Janace Hoard  . TONSILLECTOMY  1957    Social History:   reports that she has never smoked. She has never used smokeless tobacco. She reports that she does not drink alcohol or use drugs.  Allergies: Allergies  Allergen Reactions  . Amiodarone Other (See Comments)    Hair loss  . Procaine Other (See Comments)    REACTION:  Lowers blood pressure, "makes me loopy, can't move or talk"    Family History:  Family History  Problem Relation Age of Onset  . Stomach cancer Brother 74  . Colon cancer Paternal Grandmother 43  . Diabetes Father   . Hypertension Sister   . Heart attack Neg Hx      Current Outpatient Medications:  .  acetaminophen (TYLENOL) 325 MG tablet, Take 325-650 mg by mouth every 6 (six) hours as needed for headache (pain)., Disp: , Rfl:  .  acyclovir (ZOVIRAX) 400 MG tablet, Take 1 tablet (400 mg total) by mouth every 6 (six) hours as needed., Disp: 30 tablet, Rfl: 2 .  ALPRAZolam (XANAX) 0.5 MG tablet, TAKE 1/2 to 1  tab three times a day as needed, Disp: 90 tablet, Rfl: 1 .  apixaban (ELIQUIS) 5 MG TABS tablet, Take 1 tablet (5 mg total) by mouth 2 (two) times daily., Disp: 180 tablet, Rfl: 3 .  Ascorbic Acid (VITAMIN C PO), Take 1 tablet by mouth daily., Disp: , Rfl:  .  b complex vitamins tablet, Take 1 tablet by  mouth daily., Disp: , Rfl:  .  baclofen (LIORESAL) 10 MG tablet, Take 1 tablet (10 mg total) by mouth at bedtime., Disp: 90 tablet, Rfl: 1 .  cyanocobalamin (,VITAMIN B-12,) 1000 MCG/ML injection, Inject 1,000 mcg into the muscle every 30 (thirty) days., Disp: , Rfl:  .  ezetimibe (ZETIA) 10 MG tablet, Take 1 tablet (10 mg total) by mouth daily., Disp: 90 tablet, Rfl: 1 .  Multiple Vitamin (MULTIVITAMIN WITH MINERALS) TABS tablet, Take 1 tablet by mouth daily. Centrum, Disp: , Rfl:  .  Omega-3 Fatty Acids (FISH OIL PO), Take 2 capsules by mouth daily., Disp: , Rfl:  .  tiZANidine (ZANAFLEX) 4 MG capsule, Take 1 capsule (4 mg total) by mouth at bedtime., Disp: 90 capsule, Rfl: 1  Review of Systems:   Constitutional: Denies fever, chills, diaphoresis, appetite change and fatigue.  HEENT: Denies photophobia, eye pain, redness, hearing loss, ear pain, congestion, sore throat, rhinorrhea, sneezing, mouth sores, trouble swallowing, neck pain, neck stiffness and tinnitus.   Respiratory: Denies SOB, DOE, cough, chest tightness,  and wheezing.   Cardiovascular: Denies chest pain, palpitations and leg swelling.  Gastrointestinal: Denies nausea, vomiting, abdominal pain, diarrhea, constipation, blood in stool and abdominal distention.  Genitourinary: Denies dysuria, urgency, frequency, hematuria, flank pain and difficulty urinating.  Endocrine: Denies: hot or cold intolerance, sweats, changes in hair or nails, polyuria, polydipsia. Musculoskeletal: Denies myalgias, back pain, joint swelling, arthralgias and gait problem.  Skin: Denies pallor, rash and wound.  Neurological: Denies dizziness, seizures, syncope, weakness, light-headedness, numbness and headaches.  Hematological: Denies adenopathy. Easy bruising, personal or family bleeding history  Psychiatric/Behavioral: Denies suicidal ideation, mood changes, confusion, nervousness, sleep disturbance and agitation    Physical Exam: Vitals:   11/02/18 0713  BP: 110/70  Pulse: 72  Temp: 98.3 F (36.8 C)  TempSrc: Oral  SpO2: 97%  Weight: 173 lb 9.6 oz (78.7 kg)    Body mass index is 28.02 kg/m.   Constitutional: NAD, calm, comfortable Eyes: PERRL, lids and conjunctivae normal ENMT: Mucous membranes are moist. Posterior pharynx clear of any exudate or lesions. Normal dentition. Tympanic membrane is pearly white, no erythema or bulging. Neck: normal, supple, no masses, no thyromegaly Respiratory: clear to auscultation bilaterally, no wheezing, no crackles. Normal respiratory effort. No accessory muscle use.  Cardiovascular: Regular rate and rhythm, no murmurs / rubs / gallops. No extremity edema. 2+ pedal pulses. No carotid bruits.   Abdomen: no tenderness, no masses palpated. No hepatosplenomegaly. Bowel sounds positive.  Musculoskeletal: no clubbing / cyanosis. No joint deformity upper and lower extremities. Good ROM, no contractures. Normal muscle tone.  Skin: no rashes, lesions, ulcers. No induration Neurologic: CN 2-12 grossly intact. Sensation intact, DTR normal. Strength 5/5 in all 4.  Psychiatric: Normal judgment and insight. Alert and oriented x 3. Normal mood.    Subsequent Medicare wellness visit   1. Risk factors, based on past  M,S,F - CVD risk factors: HLD   2.  Physical activities: she is not physically active at all.   3.  Depression/mood: she appears fatigued and mildly depressed   4.  Hearing: no issues   5.  ADL's: independant   6.  Fall risk: low   7.  Home safety: No problems identified   8.  Height weight, and visual acuity: H/W per chart, will see ophtho for visual acuity   9.  Counseling: discussed healthy lifestyle to promote decreased CVD risk   10. Lab orders based on risk factors: Laboratory update will  be reviewed   11. Referral : None   12. Care plan: will work on increasing physical activity and on trying to find some home assistance for caring for her husband.   13. Cognitive assessment: appears intact (has been Dx with [facility] by neurology)   14. Screening: Patient provided with a written and personalized 5-10 year screening schedule in the AVS.  yes   15. Provider List Update:  PCP, neurology, endocrinology   Impression and Plan:  Encounter for preventive health examination -UTD on immunizations and age-appropriate cancer screening -Have advised routine eye and dental care. -Lab work today. -Have discussed healthy lifestyle choices including increased physical activity and better food choices  Midline low back pain without sciatica, unspecified chronicity  -Refill xanax and baclofen. One refill should last 3 months.  DYSTHYMIA  -CBT recommended.  Dyslipidemia   -Last LDL 96 in 11/17. -On Zetia. -Recheck lipids today  Subclinical hyperthyroidism  -Followed by endo. -Check TSH.    Patient Instructions  -Nice seeing you today!!  -Lab work today, will notify you with results.   Preventive Care 57 Years and Older, Female Preventive care refers to lifestyle choices and visits with your health care provider that can promote health and wellness. What does preventive care include?  A yearly physical exam. This is also called an annual well check.  Dental exams once or twice a year.  Routine eye exams. Ask your health care provider how often you should have your eyes checked.  Personal lifestyle choices, including: ? Daily care of your teeth and gums. ? Regular physical activity. ? Eating a healthy diet. ? Avoiding tobacco and drug use. ? Limiting alcohol use. ? Practicing safe sex. ? Taking low-dose aspirin every day. ? Taking vitamin and mineral supplements as recommended by your health care provider. What happens during an annual well check? The services and screenings done by your health care provider during your annual well check will depend on your age, overall health, lifestyle risk factors, and family history of disease. Counseling Your health care provider may ask you questions about your:  Alcohol use.  Tobacco use.  Drug use.  Emotional well-being.  Home and relationship well-being.  Sexual activity.  Eating habits.  History of falls.  Memory and ability to understand (cognition).  Work and work Statistician.  Reproductive health.  Screening You may have the following tests or measurements:  Height, weight, and BMI.  Blood pressure.  Lipid and cholesterol levels. These may be checked every 5 years, or more frequently if you are over 68 years old.  Skin check.  Lung cancer screening. You may have this screening every year starting at age 41 if you have a 30-pack-year history of smoking and currently  smoke or have quit within the past 15 years.  Colorectal cancer screening. All adults should have this screening starting at age 55 and continuing until age 32. You will have tests every 1-10 years, depending on your results and the type of screening test. People at increased risk should start screening at an earlier age. Screening tests may include: ? Guaiac-based fecal occult blood testing. ? Fecal immunochemical test (FIT). ? Stool DNA test. ? Virtual colonoscopy. ? Sigmoidoscopy. During this test, a flexible tube with a tiny camera (sigmoidoscope) is used to examine your rectum and lower colon. The sigmoidoscope is inserted through your anus into your rectum and lower colon. ? Colonoscopy. During this test, a long, thin, flexible tube with a tiny camera (colonoscope) is used to  examine your entire colon and rectum.  Hepatitis C blood test.  Hepatitis B blood test.  Sexually transmitted disease (STD) testing.  Diabetes screening. This is done by checking your blood sugar (glucose) after you have not eaten for a while (fasting). You may have this done every 1-3 years.  Bone density scan. This is done to screen for osteoporosis. You may have this done starting at age 61.  Mammogram. This may be done every 1-2 years. Talk to your health care provider about how often you should have regular mammograms. Talk with your health care provider about your test results, treatment options, and if necessary, the need for more tests. Vaccines Your health care provider may recommend certain vaccines, such as:  Influenza vaccine. This is recommended every year.  Tetanus, diphtheria, and acellular pertussis (Tdap, Td) vaccine. You may need a Td booster every 10 years.  Varicella vaccine. You may need this if you have not been vaccinated.  Zoster vaccine. You may need this after age 55.  Measles, mumps, and rubella (MMR) vaccine. You may need at least one dose of MMR if you were born in 1957 or  later. You may also need a second dose.  Pneumococcal 13-valent conjugate (PCV13) vaccine. One dose is recommended after age 34.  Pneumococcal polysaccharide (PPSV23) vaccine. One dose is recommended after age 28.  Meningococcal vaccine. You may need this if you have certain conditions.  Hepatitis A vaccine. You may need this if you have certain conditions or if you travel or work in places where you may be exposed to hepatitis A.  Hepatitis B vaccine. You may need this if you have certain conditions or if you travel or work in places where you may be exposed to hepatitis B.  Haemophilus influenzae type b (Hib) vaccine. You may need this if you have certain conditions. Talk to your health care provider about which screenings and vaccines you need and how often you need them. This information is not intended to replace advice given to you by your health care provider. Make sure you discuss any questions you have with your health care provider. Document Released: 08/30/2015 Document Revised: 09/23/2017 Document Reviewed: 06/04/2015 Elsevier Interactive Patient Education  2019 [place], MD [facility]

## 2018-11-08 ENCOUNTER — Other Ambulatory Visit: Payer: Self-pay

## 2018-11-08 ENCOUNTER — Telehealth: Payer: Self-pay | Admitting: *Deleted

## 2018-11-08 DIAGNOSIS — E538 Deficiency of other specified B group vitamins: Secondary | ICD-10-CM

## 2018-11-08 MED ORDER — CYANOCOBALAMIN 1000 MCG/ML IJ SOLN: 1000.0000 ug | INTRAMUSCULAR | 11 refills | Status: DC

## 2018-11-08 MED ORDER — "NEEDLE (DISP) 25G X 5/8"" MISC": 3 refills | Status: DC

## 2018-11-08 NOTE — Telephone Encounter (Signed)
She requested refill of baclofen last visit.

## 2018-11-08 NOTE — Telephone Encounter (Signed)
Spoke with patient and she has a neighbor that can give her B12 injections.  Rx sent to the pharmacy.  Reviewed reason for Rx.

## 2018-11-08 NOTE — Telephone Encounter (Signed)
RN called patient to reschedule B-12 injection appointment d/t COVID-19 outbreak. Patient is adamant on coming in for B-12 injection and refuses to take pill form. Patient also wants to know why Dr. Jerilee Hoh prescribed a muscle relaxer?

## 2018-11-09 ENCOUNTER — Ambulatory Visit: Payer: PPO

## 2018-12-21 ENCOUNTER — Other Ambulatory Visit: Payer: Self-pay | Admitting: Pulmonary Disease

## 2018-12-21 DIAGNOSIS — E785 Hyperlipidemia, unspecified: Secondary | ICD-10-CM

## 2018-12-27 ENCOUNTER — Telehealth: Payer: Self-pay | Admitting: Internal Medicine

## 2018-12-27 NOTE — Telephone Encounter (Signed)
New message    Pt changed appt on 05.19.20 to virtual visit. Pt will do a telephone call, phone number listed in appt notes.       Virtual Visit Pre-Appointment Phone Call  "(Name), I am calling you today to discuss your upcoming appointment. We are currently trying to limit exposure to the virus that causes COVID-19 by seeing patients at home rather than in the office."  1. "What is the BEST phone number to call the day of the visit?" - include this in appointment notes  2. Do you have or have access to (through a family member/friend) a smartphone with video capability that we can use for your visit?" a. If yes - list this number in appt notes as cell (if different from BEST phone #) and list the appointment type as a VIDEO visit in appointment notes b. If no - list the appointment type as a PHONE visit in appointment notes  Confirm consent - "In the setting of the current Covid19 crisis, you are scheduled for a (phone or video) visit with your provider on (date) at (time).  Just as we do with many in-office visits, in order for you to participate in this visit, we must obtain consent.  If you'd like, I can send this to your mychart (if signed up) or email for you to review.  Otherwise, I can obtain your verbal consent now.  All virtual visits are billed to your insurance company just like a normal visit would be.  By agreeing to a virtual visit, we'd like you to understand that the technology does not allow for your provider to perform an examination, and thus may limit your provider's ability to fully assess your condition. If your provider identifies any concerns that need to be evaluated in person, we will make arrangements to do so.  Finally, though the technology is pretty good, we cannot assure that it will always work on either your or our end, and in the setting of a video visit, we may have to convert it to a phone-only visit.  In either situation, we cannot ensure that we have a  secure connection.  Are you willing to proceed?" STAFF: Did the patient verbally acknowledge consent to telehealth visit? Document YES/NO here: YES  3. Advise patient to be prepared - "Two hours prior to your appointment, go ahead and check your blood pressure, pulse, oxygen saturation, and your weight (if you have the equipment to check those) and write them all down. When your visit starts, your provider will ask you for this information. If you have an Apple Watch or Kardia device, please plan to have heart rate information ready on the day of your appointment. Please have a pen and paper handy nearby the day of the visit as well."  4. Give patient instructions for MyChart download to smartphone OR [email or web address] as below if video visit (depending on what platform provider is using)  5. Inform patient they will receive a phone call 15 minutes prior to their appointment time (may be from unknown caller ID) so they should be prepared to answer    [facility] has been deemed a candidate for a follow-up tele-health visit to limit community exposure during the Covid-19 pandemic. I spoke with the patient via phone to ensure availability of phone/video source, confirm preferred email & phone number, and discuss instructions and expectations.  I reminded Carly Rhodes to be prepared with any vital sign and/or heart  rhythm information that could potentially be obtained via home monitoring, at the time of her visit. I reminded Carly Rhodes to expect a phone call prior to her visit.  Carly Rhodes 12/27/2018 4:27 PM   INSTRUCTIONS FOR DOWNLOADING THE MYCHART APP TO SMARTPHONE  - The patient must first make sure to have activated MyChart and know their login information - If Apple, go to [employer] and type in MyChart in the search bar and download the app. If Android, ask patient to go to [employer] and type in [place] in the search bar and download the  app. The app is free but as with any other app downloads, their phone may require them to verify saved payment information or Apple/Android password.  - The patient will need to then log into the app with their MyChart username and password, and select [facility] as their healthcare provider to link the account. When it is time for your visit, go to the MyChart app, find appointments, and click Begin Video Visit. Be sure to Select Allow for your device to access the Microphone and Camera for your visit. You will then be connected, and your provider will be with you shortly.  **If they have any issues connecting, or need assistance please contact MyChart service desk [phone])**  **If using a computer, in order to ensure the best quality for their visit they will need to use either of the following Internet Browsers: [employer], or Google Chrome**  IF USING DOXIMITY or DOXY.ME - The patient will receive a link just prior to their visit by text.     FULL LENGTH CONSENT FOR TELE-HEALTH VISIT   I hereby voluntarily request, consent and authorize [facility] and its employed or contracted physicians, physician assistants, nurse practitioners or other licensed health care professionals (the Practitioner), to provide me with telemedicine health care services (the Services") as deemed necessary by the treating Practitioner. I acknowledge and consent to receive the Services by the Practitioner via telemedicine. I understand that the telemedicine visit will involve communicating with the Practitioner through live audiovisual communication technology and the disclosure of certain medical information by electronic transmission. I acknowledge that I have been given the opportunity to request an in-person assessment or other available alternative prior to the telemedicine visit and am voluntarily participating in the telemedicine visit.  I understand that I have the right to withhold or  withdraw my consent to the use of telemedicine in the course of my care at any time, without affecting my right to future care or treatment, and that the Practitioner or I may terminate the telemedicine visit at any time. I understand that I have the right to inspect all information obtained and/or recorded in the course of the telemedicine visit and may receive copies of available information for a reasonable fee.  I understand that some of the potential risks of receiving the Services via telemedicine include:   Delay or interruption in medical evaluation due to technological equipment failure or disruption;  Information transmitted may not be sufficient (e.g. poor resolution of images) to allow for appropriate medical decision making by the Practitioner; and/or   In rare instances, security protocols could fail, causing a breach of personal health information.  Furthermore, I acknowledge that it is my responsibility to provide information about my medical history, conditions and care that is complete and accurate to the best of my ability. I acknowledge that Practitioner's advice, recommendations, and/or decision may be based on  factors not within their control, such as incomplete or inaccurate data provided by me or distortions of diagnostic images or specimens that may result from electronic transmissions. I understand that the practice of medicine is not an exact science and that Practitioner makes no warranties or guarantees regarding treatment outcomes. I acknowledge that I will receive a copy of this consent concurrently upon execution via email to the email address I last provided but may also request a printed copy by calling the office of [facility].    I understand that my insurance will be billed for this visit.   I have read or had this consent read to me.  I understand the contents of this consent, which adequately explains the benefits and risks of the Services being provided via  telemedicine.   I have been provided ample opportunity to ask questions regarding this consent and the Services and have had my questions answered to my satisfaction.  I give my informed consent for the services to be provided through the use of telemedicine in my medical care  By participating in this telemedicine visit I agree to the above.

## 2018-12-28 ENCOUNTER — Telehealth: Payer: Self-pay | Admitting: Internal Medicine

## 2018-12-28 NOTE — Telephone Encounter (Signed)
She would not say what it is about except to say that she "respects Dr. Oneida Alar opinion and wants to know what type of doctor to see".  [place], please call her back, thanks

## 2019-01-03 ENCOUNTER — Other Ambulatory Visit: Payer: Self-pay

## 2019-01-03 ENCOUNTER — Ambulatory Visit (INDEPENDENT_AMBULATORY_CARE_PROVIDER_SITE_OTHER): Payer: PPO | Admitting: Sports Medicine

## 2019-01-03 ENCOUNTER — Telehealth (INDEPENDENT_AMBULATORY_CARE_PROVIDER_SITE_OTHER): Payer: PPO | Admitting: Internal Medicine

## 2019-01-03 DIAGNOSIS — I480 Paroxysmal atrial fibrillation: Secondary | ICD-10-CM

## 2019-01-03 DIAGNOSIS — G3184 Mild cognitive impairment, so stated: Secondary | ICD-10-CM | POA: Diagnosis not present

## 2019-01-03 NOTE — Progress Notes (Signed)
CC; cognitive issues  Due to concerns with Covid-19 we felt and patient agreed a telehealth visit would be ideal. Patient agrees to telephone visit and understands limitations.  Patient at home. I contacted her from [facility] office.  Patient contacted me about her cognitive issues as I have helped her with medical problems for years.  She remains under the impression that she had encephalitis while hospitalized in spring 2019.  She had a high fever and likely viral infection that was associated with confusion in the hospital.  The following is from her [facility] consultation note with Dr. [name]:  Records from her hospitalization in May 2019 were reviewed and it was clarified with the patient that she did not have a brain infection during her hospital admission. She was evaluated by Neurology and had an unremarkable MRI brain, EEG, and lumbar puncture, diagnosis of altered mental status in the setting of high fever, most likely explanation is viral syndrome with delirium with post-viral labyrinthitis.  Since that time she has had more bouts of short term memory loss. No severe headache problems but does have some headaches. 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  She has discussed with her PCP and her cardiologist the issues with fatigue.  While she has some medical issues that could contribute, I think the consensus is that stress and poorer sleep patterns and rest patterns from all the care she provides her husband may be the major factors.  She is treated for atrial fibrillation by Dr. Lovena Le and seems stable.  She had a televisit this morning.  She is on Eliquis and will be followed in 1 year.  He encouraged her to walk more.  PCP has monitored her thyroid and she also gets injectable B12 for low levels.  While she worries that her memory is not as good and that she sometimes has more dizziness and brief confusion nothing has really changed in the past  few months with these symptoms.  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

## 2019-01-03 NOTE — Progress Notes (Signed)
Electrophysiology TeleHealth Note   Due to national recommendations of social distancing due to COVID 19, an audio/video telehealth visit is felt to be most appropriate for this patient at this time.  See MyChart message from today for the patient's consent to telehealth for [facility].   Date:  01/03/2019   ID:  Carly Rhodes, DOB 05/21/1946, MRN [id number]  Location: patient's home  Provider location: [address], [place]  Evaluation Performed: Follow-up visit  PCP:  Isaac Bliss, Rayford Halsted, MD  Cardiologist:  Candee Furbish, MD Electrophysiologist:  Dr Lovena Le  Chief Complaint:  "I'm doing ok but it is hard."  History of Present Illness:    Carly Rhodes is a 73 y.o. female who presents via audio/video conferencing for a telehealth visit today. 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 She developed thyroiditis and her amiodarone was stopped. She has had minimal palpitations. She has not had syncope. No edema.  Since last being seen in our clinic, the patient reports doing very well.    The patient denies symptoms of fevers, chills, cough, or new SOB worrisome for COVID 19.  Past Medical History:  Diagnosis Date  . Anxiety   . Depression   . Family history of anesthesia complication    sister extreme nausea & vomiting  . 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 Osteoporosis   . Seasonal allergies     Past Surgical History:  Procedure Laterality Date  . ABDOMINAL HYSTERECTOMY  1984  . APPENDECTOMY  1984   at time of Hysterectomy  . HAMMER TOE SURGERY  1993   2nd toe left foot  . THYROID LOBECTOMY  09/15/2012   Procedure: THYROID LOBECTOMY;  Surgeon: Melissa Montane, MD;  Location: [facility];  Service: ENT;  Laterality: Left;  . THYROIDECTOMY, PARTIAL  09/15/2012   Dr Janace Hoard  . TONSILLECTOMY  1957    Current Outpatient Medications  Medication Sig Dispense Refill  . acetaminophen (TYLENOL) 325 MG tablet  Take 325-650 mg by mouth every 6 (six) hours as needed for headache (pain).    Marland Kitchen acyclovir (ZOVIRAX) 400 MG tablet Take 1 tablet (400 mg total) by mouth every 6 (six) hours as needed. 30 tablet 2  . ALPRAZolam (XANAX) 0.5 MG tablet TAKE 1/2 to 1  tab three times a day as needed 90 tablet 1  . apixaban (ELIQUIS) 5 MG TABS tablet Take 1 tablet (5 mg total) by mouth 2 (two) times daily. 180 tablet 3  . Ascorbic Acid (VITAMIN C PO) Take 1 tablet by mouth daily.    Marland Kitchen b complex vitamins tablet Take 1 tablet by mouth daily.    . baclofen (LIORESAL) 10 MG tablet Take 1 tablet (10 mg total) by mouth at bedtime. 90 tablet 1  . cyanocobalamin (,VITAMIN B-12,) 1000 MCG/ML injection Inject 1 mL (1,000 mcg total) into the muscle every 30 (thirty) days. 1 mL 11  . ezetimibe (ZETIA) 10 MG tablet Take 1 tablet (10 mg total) by mouth daily. 90 tablet 1  . Multiple Vitamin (MULTIVITAMIN WITH MINERALS) TABS tablet Take 1 tablet by mouth daily. Centrum    . NEEDLE, DISP, 25 G (BD ECLIPSE NEEDLE) 25G X 5/8" MISC Use for B12 injections 100 each 3  . Omega-3 Fatty Acids (FISH OIL PO) Take 2 capsules by mouth daily.    Marland Kitchen tiZANidine (ZANAFLEX) 4 MG capsule Take 1 capsule (4 mg total) by mouth  at bedtime. 90 capsule 1   No current facility-administered medications for this visit.     Allergies:   Amiodarone and Procaine   Social History:  The patient  reports that she has never smoked. She has never used smokeless tobacco. She reports that she does not drink alcohol or use drugs.   Family History:  The patient's  family history includes Colon cancer (age of onset: 39) in her paternal grandmother; Diabetes in her father; Hypertension in her sister; Stomach cancer (age of onset: 19) in her brother.   ROS:  Please see the history of present illness.   All other systems are personally reviewed and negative.    Exam:    Vital Signs:  There were no vitals taken for this visit.    Labs/Other Tests and Data Reviewed:     Recent Labs: 11/02/2018: ALT 42; BUN 19; Creatinine, Ser 0.86; Hemoglobin 13.9; Platelets 221.0; Potassium 4.0; Sodium 142; TSH 0.74   Wt Readings from Last 3 Encounters:  11/02/18 173 lb 9.6 oz (78.7 kg)  09/21/18 176 lb 12.8 oz (80.2 kg)  08/11/18 176 lb (79.8 kg)     Other studies personally reviewed:    ASSESSMENT & PLAN:    1.  PAf - she is asymptomatic. She will continue her blood thinner. 2. coags - she is tolerating her eliquis. She  3. COVID 19 screen The patient denies symptoms of COVID 19 at this time.  The importance of social distancing was discussed today.  Follow-up:  1 year Next remote: n/a  Current medicines are reviewed at length with the patient today.   The patient does not have concerns regarding her medicines.  The following changes were made today:  none  Labs/ tests ordered today include: none No orders of the defined types were placed in this encounter.    Patient Risk:  after full review of this patients clinical status, I feel that they are at moderate risk at this time.  Today, I have spent 22 minutes with the patient with telehealth technology discussing all of the above.    Signed, Cristopher Peru, MD  01/03/2019 9:40 AM     [address] [place] [address] [phone] (office) [phone] (fax)

## 2019-01-03 NOTE — Assessment & Plan Note (Signed)
Currently followed by Dr. Isaac Bliss She has had the normal screening and these are reassuring  Neurology consult did not find active dementia and MIR normal last year  Like her other physicians I believe stress would make these symptoms worse Agree she should walk more if she can get free of care-giving to do so Diet changes - try to increase green leafy vegetables to include kale, collards, spinach and consider adding beets --all have NO which may have some circulatory benefits Keep up her supplements  She was supposed to return to Dr [name] after 6 months but did not follow up This might be useful to help her see if her mental status appears stable

## 2019-01-30 ENCOUNTER — Ambulatory Visit: Payer: Self-pay

## 2019-01-30 ENCOUNTER — Telehealth: Payer: Self-pay | Admitting: Pulmonary Disease

## 2019-01-30 NOTE — Telephone Encounter (Signed)
Incoming  Call from pt.with complaint of chills head feel foggy no enrgy and runny nose.  Onset was 3 days ago.   Reports muscle aches.  Repots muscle aches. Denies Fever. No energy.    Attempted to call office for an appointment.    No answerer.  Provided Heath Department #.  .  Reason for Disposition . [1] COVID-19 infection suspected by caller or triager AND [2] mild symptoms (cough, fever, or others) AND [7] no complications or SOB  Answer Assessment - Initial Assessment Questions 1. COVID-19 DIAGNOSIS: "Who made your Coronavirus (COVID-19) diagnosis?" "Was it confirmed by a positive lab test?" If not diagnosed by a HCP, ask "Are there lots of cases (community spread) where you live?" (See public health department website, if unsure)     2. ONSET: "When did the COVID-19 symptoms start?"      3 days ago 3. WORST SYMPTOM: "What is your worst symptom?" (e.g., cough, fever, shortness of breath, muscle aches)     Muscle ache 4. COUGH: "Do you have a cough?" If so, ask: "How bad is the cough?"      Denies front feels funny,  Nose runny bummble siting on top  5. FEVER: "Do you have a fever?" If so, ask: "What is your temperature, how was it measured, and when did it start?"     *No Answer* 6. RESPIRATORY STATUS: "Describe your breathing?" (e.g., shortness of breath, wheezing, unable to speak)      denies 7. BETTER-SAME-WORSE: "Are you getting better, staying the same or getting worse compared to yesterday?"  If getting worse, ask, "In what way?"    Fells worse 8. HIGH RISK DISEASE: "Do you have any chronic medical problems?" (e.g., asthma, heart or lung disease, weak immune system, etc.)     Lost normal heart rhythm  Last year 9. PREGNANCY: "Is there any chance you are pregnant?" "When was your last menstrual period?"     na 10. OTHER SYMPTOMS: "Do you have any other symptoms?"  (e.g., chills, fatigue, headache, loss of smell or taste, muscle pain, sore throat)       *No Answer*  Protocols  used: CORONAVIRUS (COVID-19) DIAGNOSED OR SUSPECTED-A-AH

## 2019-01-30 NOTE — Telephone Encounter (Signed)
Primary Pulmonologist: Former [facility] PCP pt Last office visit and with whom: 06/15/18 with [facility] What do we see them for (pulmonary problems): pt was PCP pt Last OV assessment/plan: Instructions  Today we updated your med list in our EPIC system...    Continue your current medications the same...  Keep up the good work w/ your diet & exercise program...   Try to de-stress your life, and enjoy the journey!  We will arrange for a NEUROLOGY consult as you requested... And we will check on the status of the ENDOCRINE consult we previously discussed (for your thyroid)...  We gave you hand-outs w/ information about the [facility] and [facility] primary care offices...     Was appointment offered to patient (explain)?  No, pt is former pcp pt. Pt needs to call pcp.   Reason for call: called and spoke with pt who stated she believes she may have a cold and is wanting to get tested for COVID. Stated to pt that she needed to call her PCP as we are unable to do COVID test at our office. Stated to her that she can discuss symptoms with PCP and if they want her to have COVID test performed, they may be able to do it at their office as some PCP offices are able to do COVID test and if they are not able to do it at their office and if they want her to have it performed, they could tell her where she could go. Pt expressed understanding. Nothing further needed.  (examples of things to ask: : When did symptoms start? Fever? Cough? Productive? Color to sputum? More sputum than usual? Wheezing? Have you needed increased oxygen? Are you taking your respiratory medications? What over the counter measures have you tried?)

## 2019-02-01 ENCOUNTER — Other Ambulatory Visit: Payer: Self-pay | Admitting: Internal Medicine

## 2019-02-01 DIAGNOSIS — M545 Low back pain, unspecified: Secondary | ICD-10-CM

## 2019-02-01 DIAGNOSIS — F341 Dysthymic disorder: Secondary | ICD-10-CM

## 2019-02-07 ENCOUNTER — Other Ambulatory Visit: Payer: Self-pay

## 2019-02-07 ENCOUNTER — Ambulatory Visit (INDEPENDENT_AMBULATORY_CARE_PROVIDER_SITE_OTHER): Payer: PPO | Admitting: Sports Medicine

## 2019-02-07 DIAGNOSIS — R269 Unspecified abnormalities of gait and mobility: Secondary | ICD-10-CM

## 2019-02-07 DIAGNOSIS — M21611 Bunion of right foot: Secondary | ICD-10-CM | POA: Diagnosis not present

## 2019-02-07 DIAGNOSIS — M2010 Hallux valgus (acquired), unspecified foot: Secondary | ICD-10-CM | POA: Insufficient documentation

## 2019-02-07 NOTE — Assessment & Plan Note (Signed)
We are hoping that the orthotics will take pressure off the bunion  She does need to use a wide toe box and a soft shoe not to put pressure on this

## 2019-02-07 NOTE — Progress Notes (Signed)
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 She has had chronic foot pain for which she is tried a variety of over-the-counter inserts Currently she is using an over-the-counter insert along with a plastic arch support This current combination is causing her pain in both feet However if she tries to go without some arch support she gets both foot pain and some lower leg tightness  Social history Patient has been married the past 3 years to a 74 year old man and does a lot of of physical activity to help care for him This means that during the day she is on her feet a lot  Review of systems History of right shoulder pain that is now only mild Periodic swelling of the left knee if she stands too much  Physical exam Pleasant older female in no acute distress BP 120/80   Patient shows a large bunion and significant breakdown of the medial column of the right foot There is long arch collapse There is some flattening of the transverse arch Some splaying of the toes  Left foot shows less significant bunion change Long arch collapse Some transverse arch loss  She walks and stands in pronation

## 2019-02-07 NOTE — Assessment & Plan Note (Signed)
Patient was fitted for a : standard, cushioned, semi-rigid orthotic. The orthotic was heated and afterward the patient stood on the orthotic blank positioned on the orthotic stand. The patient was positioned in subtalar neutral position and 10 degrees of ankle dorsiflexion in a weight bearing stance. After completion of molding, a stable base was applied to the orthotic blank. The blank was ground to a stable position for weight bearing. Size: 9 Red EVA Base: blue EVA Posting: none Additional orthotic padding: none  After completion of orthotics she had a correction of most of her pronation There is still some genuine valgus on the right that affects her gait She did have very good comfort with the orthotics and felt much less foot pain We will try these over the next several months and see if they are working well

## 2019-02-09 ENCOUNTER — Other Ambulatory Visit: Payer: Self-pay | Admitting: Pulmonary Disease

## 2019-02-09 DIAGNOSIS — E785 Hyperlipidemia, unspecified: Secondary | ICD-10-CM

## 2019-02-14 ENCOUNTER — Telehealth: Payer: Self-pay | Admitting: Internal Medicine

## 2019-02-14 NOTE — Telephone Encounter (Signed)
Office visit scheduled.

## 2019-02-14 NOTE — Telephone Encounter (Signed)
Pt called stating she is frustrated because her pharmacy reached out to the office in regards to refilling a medication early and no response was given. Pt states she needed  There medication and that she needs to be able to rely on the office to handle these things for her. Pt is requesting to speak to PCP assistant. Please advise.

## 2019-02-16 ENCOUNTER — Other Ambulatory Visit: Payer: Self-pay

## 2019-02-16 ENCOUNTER — Ambulatory Visit (INDEPENDENT_AMBULATORY_CARE_PROVIDER_SITE_OTHER): Payer: PPO | Admitting: Internal Medicine

## 2019-02-16 DIAGNOSIS — M545 Low back pain, unspecified: Secondary | ICD-10-CM

## 2019-02-16 DIAGNOSIS — G3184 Mild cognitive impairment, so stated: Secondary | ICD-10-CM | POA: Diagnosis not present

## 2019-02-16 DIAGNOSIS — E785 Hyperlipidemia, unspecified: Secondary | ICD-10-CM | POA: Diagnosis not present

## 2019-02-16 DIAGNOSIS — I480 Paroxysmal atrial fibrillation: Secondary | ICD-10-CM | POA: Diagnosis not present

## 2019-02-16 DIAGNOSIS — G4700 Insomnia, unspecified: Secondary | ICD-10-CM

## 2019-02-16 DIAGNOSIS — F341 Dysthymic disorder: Secondary | ICD-10-CM | POA: Diagnosis not present

## 2019-02-16 MED ORDER — EZETIMIBE 10 MG PO TABS: 10.0000 mg | ORAL_TABLET | Freq: Every day | ORAL | 1 refills | Status: DC

## 2019-02-16 MED ORDER — BACLOFEN 10 MG PO TABS: 10.0000 mg | ORAL_TABLET | Freq: Every day | ORAL | 1 refills | Status: DC

## 2019-02-16 MED ORDER — ALPRAZOLAM 0.5 MG PO TABS: ORAL_TABLET | ORAL | 0 refills | Status: DC

## 2019-02-16 NOTE — Progress Notes (Signed)
Virtual Visit via Telephone Note  I connected with Carly Rhodes on 02/16/19 at 10:30 AM EDT by telephone and verified that I am speaking with the correct person using two identifiers.   I discussed the limitations, risks, security and privacy concerns of performing an evaluation and management service by telephone and the availability of in person appointments. I also discussed with the patient that there may be a patient responsible charge related to this service. The patient expressed understanding and agreed to proceed.  Location patient: home Location provider: work office Participants present for the call: patient, provider Patient did not have a visit in the prior 7 days to address this/these issue(s).   History of Present Illness:  She has scheduled this visit mainly for medication refills but also for follow up of chronic conditions. She has no new issues today. She has mild cognitive impairment as diagnosed by neurology. States she lost her medicines in the kitchen yesterday while wiping the counters, not individual pills but the bottle of her cholesterol med. We have tried setting up some in-home care in the past but she has refused on multiple occasions. States her "neuropathy" is really bothering her and her neurologist wants her to stay on baclofen for that. Also needs her xanax refilled for sleep.   Observations/Objective: Patient sounds cheerful and well on the phone. I do not appreciate any increased work of breathing. Speech and thought processing are grossly intact. Patient reported vitals: none reported   Current Outpatient Medications:  .  acetaminophen (TYLENOL) 325 MG tablet, Take 325-650 mg by mouth every 6 (six) hours as needed for headache (pain)., Disp: , Rfl:  .  acyclovir (ZOVIRAX) 400 MG tablet, Take 1 tablet (400 mg total) by mouth every 6 (six) hours as needed., Disp: 30 tablet, Rfl: 2 .  ALPRAZolam (XANAX) 0.5 MG tablet, TAKE 1/2 TO 1 (ONE-HALF TO  ONE) TABLET BY MOUTH THREE TIMES DAILY AS NEEDED, Disp: 90 tablet, Rfl: 0 .  apixaban (ELIQUIS) 5 MG TABS tablet, Take 1 tablet (5 mg total) by mouth 2 (two) times daily., Disp: 180 tablet, Rfl: 3 .  Ascorbic Acid (VITAMIN C PO), Take 1 tablet by mouth daily., Disp: , Rfl:  .  b complex vitamins tablet, Take 1 tablet by mouth daily., Disp: , Rfl:  .  baclofen (LIORESAL) 10 MG tablet, Take 1 tablet (10 mg total) by mouth at bedtime., Disp: 90 tablet, Rfl: 1 .  cyanocobalamin (,VITAMIN B-12,) 1000 MCG/ML injection, Inject 1 mL (1,000 mcg total) into the muscle every 30 (thirty) days., Disp: 1 mL, Rfl: 11 .  ezetimibe (ZETIA) 10 MG tablet, Take 1 tablet (10 mg total) by mouth daily., Disp: 90 tablet, Rfl: 1 .  Multiple Vitamin (MULTIVITAMIN WITH MINERALS) TABS tablet, Take 1 tablet by mouth daily. Centrum, Disp: , Rfl:  .  NEEDLE, DISP, 25 G (BD ECLIPSE NEEDLE) 25G X 5/8" MISC, Use for B12 injections, Disp: 100 each, Rfl: 3 .  Omega-3 Fatty Acids (FISH OIL PO), Take 2 capsules by mouth daily., Disp: , Rfl:  .  tiZANidine (ZANAFLEX) 4 MG capsule, Take 1 capsule (4 mg total) by mouth at bedtime., Disp: 90 capsule, Rfl: 1  Review of Systems:  Constitutional: Denies fever, chills, diaphoresis, appetite change and fatigue.  HEENT: Denies photophobia, eye pain, redness, hearing loss, ear pain, congestion, sore throat, rhinorrhea, sneezing, mouth sores, trouble swallowing, neck pain, neck stiffness and tinnitus.   Respiratory: Denies SOB, DOE, cough, chest tightness,  and  wheezing.   Cardiovascular: Denies chest pain, palpitations and leg swelling.  Gastrointestinal: Denies nausea, vomiting, abdominal pain, diarrhea, constipation, blood in stool and abdominal distention.  Genitourinary: Denies dysuria, urgency, frequency, hematuria, flank pain and difficulty urinating.  Endocrine: Denies: hot or cold intolerance, sweats, changes in hair or nails, polyuria, polydipsia. Musculoskeletal: Denies myalgias,  back pain, joint swelling, arthralgias and gait problem.  Skin: Denies pallor, rash and wound.  Neurological: Denies dizziness, seizures, syncope, weakness, light-headedness, numbness and headaches.  Hematological: Denies adenopathy. Easy bruising, personal or family bleeding history  Psychiatric/Behavioral: Denies suicidal ideation, mood changes, confusion, nervousness, sleep disturbance and agitation   Assessment and Plan:  Insomnia, unspecified type -Refill 90 tabs of ativan today; this should last her 3 months per our agreement. -PDMP has been reviewed and there are no red flags. Marked reviewed in Epic.  Dyslipidemia  - Plan: ezetimibe (ZETIA) 10 MG tablet  Mild cognitive impairment -Continue OP follow up with neurology  Paroxysmal atrial fibrillation ([place]) -on Eliquis Rx'd by cardiology.    I discussed the assessment and treatment plan with the patient. The patient was provided an opportunity to ask questions and all were answered. The patient agreed with the plan and demonstrated an understanding of the instructions.   The patient was advised to call back or seek an in-person evaluation if the symptoms worsen or if the condition fails to improve as anticipated.  I provided 25 minutes of non-face-to-face time during this encounter.   Lelon Frohlich, MD [facility]

## 2019-03-06 ENCOUNTER — Ambulatory Visit (INDEPENDENT_AMBULATORY_CARE_PROVIDER_SITE_OTHER): Payer: PPO | Admitting: Neurology

## 2019-03-06 ENCOUNTER — Other Ambulatory Visit: Payer: Self-pay

## 2019-03-06 ENCOUNTER — Encounter: Payer: Self-pay | Admitting: Neurology

## 2019-03-06 VITALS — BP 118/77 | HR 73 | Ht 65.5 in | Wt 178.2 lb

## 2019-03-06 DIAGNOSIS — R2681 Unsteadiness on feet: Secondary | ICD-10-CM

## 2019-03-06 DIAGNOSIS — G3184 Mild cognitive impairment, so stated: Secondary | ICD-10-CM

## 2019-03-06 DIAGNOSIS — G629 Polyneuropathy, unspecified: Secondary | ICD-10-CM

## 2019-03-06 NOTE — Progress Notes (Signed)
NEUROLOGY FOLLOW UP OFFICE NOTE  Carly Rhodes [id number] 1946-07-14  HISTORY OF PRESENT ILLNESS: I had the pleasure of seeing Carly Rhodes in follow-up in the neurology clinic on 03/06/2019.  The patient was last seen 7 months ago after hospitalization in May 2019 for encephalopathy due to viral syndrome with delirium and post-viral labyrinthitis. Workup in the hospital was negative for primary brain condition however she was under the impression she had a brain infection, which was clarified as not the case on her initial visit. She was noted to have Mild Cognitive Impairment on memory testing in 07/2018 with a MOCA score of 20/30. 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 She endorsed significant stress taking care of her husband.   She is quite verbose in the office and difficult to redirect. She reports falls that she feels comes from her head. She would be stumbling and cannot turn her head around. If she turns, she would feel like she cannot see anything. She denies frank vertigo. She needs to look at one thing at a time. She keeps repeating that she would to get herself back. She feels that since her hospitalization in 2019, she has not been the same. She reports that she was prescribed Baclofen by Dr. Lenna Gilford 3 years ago to help her relax when she sleeps ("I'm not a relaxed person." She states her PCP told her that Baclofen is used for neuropathy. When she has days of feeling unbalanced, it feels like she is two different people. She denies any falls but stumbles around and catches herself. When tired, balance is worse. She also reports memory issues, she lays things down and cannot find them. She denies getting lost driving. No missed medications or bills. She continues to be the primary caregiver for her older husband, and states she has no time to do  anything else.   History on Initial Assessment 08/02/2018: This is a 73 year old right-handed woman with a history of atrial fibrillation on Eliquis, hypothyroidism, anxiety, depression, presenting for evaluation after hospitalization for encephalopathy. She was under the impression that she had a brain infection at that time and wonders is the brain infection is causing her fatigue. Records from her hospitalization in May 2019 were reviewed and it was clarified with the patient that she did not have a brain infection during her hospital admission. She was evaluated by Neurology and had an unremarkable MRI brain, EEG, and lumbar puncture, diagnosis of altered mental status in the setting of high fever, most likely explanation is viral syndrome with delirium with post-viral labyrinthitis. She presented to the hospital for headaches and diffuse weakness leading to a fall. She had not been eating for a few days. Her husband was unable to assist her off the floor. She woke up the next morning with speech difficulty, she could understand people but could not speak. She was febrile on arrival to the ED and was noted to be able to give history but show signs of altered mental status. She was found to have atrial fibrillation with RVR and started on Eliquis. I personally reviewed MRI brain with and without contrast done 01/13/18 which did not show any acute changes, there was mild diffuse atrophy, mild chronic microvascular disease. Cuts through the IACs showed normal 7th and 8th cranial nerves, no enhancing mass in temporal region. Her EEG was normal. She had a lumbar puncture with  CSF showing WBC of 2, RBC 4400 in tub 1, 515 in tube 4, normal protein, glucose, negative HSV 1 and 2, negative CSF culture. She was treated empirically with acyclovir, ceftriaxone, and ampicillin, which were discontinued when cultures came back negative. During her stay, she developed vertigo and persistent right-beating nystagmus with rotary  component in all directions, consistent with labyrinthitis. She described illusions/delusions during her stay.   She denies any further headaches, vertigo, or hallucinations since her hospitalization in May 2019. Her main concern is her balance. She states most of the time it is good, but other times it is so bad, especially in an enclosed area. She has not fallen but has had to hold on to objects. She states most of the time she can correct herself. She denies any focal numbness/tingling/weakness, neck/back pain, bowel/bladder dysfunction. She states she does not have headaches, but a place in the back of her head feels funny at times then hurts, she takes a Tylenol with good effect. She denies any vertigo, diplopia, dysarthria/dysphagia. She has noticed cognitive difficulties since her hospitalization in May. She is the primary caregiver of her 74 year old husband. They have been married for 3 years. She states she looks after him and does everything for him. He has a scooter for transfers, she does not need to help him to get around, but she bathes him once a week. She knows she needs help but does not know how to do it since "everything is so personal." She manages finances and has had more trouble since coming out of the hospital. She manages her husband's medications and states she does not forget them, but sometimes forgets her own medication. If she is unsure if she took the Eliquis, she would not take it that day.   PAST MEDICAL HISTORY: Past Medical History:  Diagnosis Date   Anxiety    Depression    Family history of anesthesia complication    sister extreme nausea & vomiting   Hepatitis 1953   "contagious type"   Osteoporosis    Seasonal allergies     MEDICATIONS: Current Outpatient Medications on File Prior to Visit  Medication Sig Dispense Refill   acetaminophen (TYLENOL) 325 MG tablet Take 325-650 mg by mouth every 6 (six) hours as needed for headache (pain).      acyclovir (ZOVIRAX) 400 MG tablet Take 1 tablet (400 mg total) by mouth every 6 (six) hours as needed. 30 tablet 2   ALPRAZolam (XANAX) 0.5 MG tablet TAKE 1/2 TO 1 (ONE-HALF TO ONE) TABLET BY MOUTH THREE TIMES DAILY AS NEEDED 90 tablet 0   apixaban (ELIQUIS) 5 MG TABS tablet Take 1 tablet (5 mg total) by mouth 2 (two) times daily. 180 tablet 3   Ascorbic Acid (VITAMIN C PO) Take 1 tablet by mouth daily.     b complex vitamins tablet Take 1 tablet by mouth daily.     baclofen (LIORESAL) 10 MG tablet Take 1 tablet (10 mg total) by mouth at bedtime. 90 tablet 1   cyanocobalamin (,VITAMIN B-12,) 1000 MCG/ML injection Inject 1 mL (1,000 mcg total) into the muscle every 30 (thirty) days. 1 mL 11   ezetimibe (ZETIA) 10 MG tablet Take 1 tablet (10 mg total) by mouth daily. 90 tablet 1   Multiple Vitamin (MULTIVITAMIN WITH MINERALS) TABS tablet Take 1 tablet by mouth daily. Centrum     NEEDLE, DISP, 25 G (BD ECLIPSE NEEDLE) 25G X 5/8" MISC Use for B12 injections 100 each 3  Omega-3 Fatty Acids (FISH OIL PO) Take 2 capsules by mouth daily.     tiZANidine (ZANAFLEX) 4 MG capsule Take 1 capsule (4 mg total) by mouth at bedtime. 90 capsule 1   No current facility-administered medications on file prior to visit.     ALLERGIES: Allergies  Allergen Reactions   Amiodarone Other (See Comments)    Hair loss   Procaine Other (See Comments)    REACTION:  Lowers blood pressure, "makes me loopy, can't move or talk"    FAMILY HISTORY: Family History  Problem Relation Age of Onset   Stomach cancer Brother 88   Colon cancer Paternal Grandmother 38   Diabetes Father    Hypertension Sister    Heart attack Neg Hx     SOCIAL HISTORY: Social History   Socioeconomic History   Marital status: Married    Spouse name: Not on file   Number of children: Not on file   Years of education: Not on file   Highest education level: Not on file  Occupational History   Not on file  Social  Needs   Financial resource strain: Not on file   Food insecurity    Worry: Not on file    Inability: Not on file   Transportation needs    Medical: Not on file    Non-medical: Not on file  Tobacco Use   Smoking status: Never Smoker   Smokeless tobacco: Never Used  Substance and Sexual Activity   Alcohol use: No   Drug use: No   Sexual activity: Not Currently    Birth control/protection: Post-menopausal, Surgical  Lifestyle   Physical activity    Days per week: Not on file    Minutes per session: Not on file   Stress: Not on file  Relationships   Social connections    Talks on phone: Not on file    Gets together: Not on file    Attends religious service: Not on file    Active member of club or organization: Not on file    Attends meetings of clubs or organizations: Not on file    Relationship status: Not on file   Intimate partner violence    Fear of current or ex partner: Not on file    Emotionally abused: Not on file    Physically abused: Not on file    Forced sexual activity: Not on file  Other Topics Concern   Not on file  Social History Narrative   Pt lives in 2 story home with her husband   Has 2 adult children   High school graduate   Retired Emergency planning/management officer   Current care giver to husband    REVIEW OF SYSTEMS: Constitutional: No fevers, chills, or sweats, no generalized fatigue, change in appetite Eyes: No visual changes, double vision, eye pain Ear, nose and throat: No hearing loss, ear pain, nasal congestion, sore throat Cardiovascular: No chest pain, palpitations Respiratory:  No shortness of breath at rest or with exertion, wheezes GastrointestinaI: No nausea, vomiting, diarrhea, abdominal pain, fecal incontinence Genitourinary:  No dysuria, urinary retention or frequency Musculoskeletal:  No neck pain, back pain, +right hip pain Integumentary: No rash, pruritus, skin lesions Neurological: as above Psychiatric: No depression, insomnia,  anxiety Endocrine: No palpitations, fatigue, diaphoresis, mood swings, change in appetite, change in weight, increased thirst Hematologic/Lymphatic:  No anemia, purpura, petechiae. Allergic/Immunologic: no itchy/runny eyes, nasal congestion, recent allergic reactions, rashes  PHYSICAL EXAM: Vitals:   03/06/19 1010  BP: 118/77  Pulse: 73  SpO2: 95%   General: No acute distress, verbose, impulsive, difficult to redirect Head:  Normocephalic/atraumatic Skin/Extremities: No rash, no edema Neurological Exam: alert and oriented to person, place, and time. No aphasia or dysarthria. Fund of knowledge is appropriate.  Recent and remote memory are intact.  Attention and concentration are reduced.  Able to name objects and repeat phrases. Cranial nerves: Pupils equal, round, reactive to light.  Extraocular movements intact with no nystagmus. Visual fields full. Facial sensation intact. No facial asymmetry. Tongue, uvula, palate midline.  Motor: Bulk and tone normal, muscle strength 5/5 throughout with no pronator drift.  Sensation to light touch, temperature. Deep tendon reflexes +2 on both UE, patella, unable to elicit on both ankles. Toes downgoing.  Finger to nose testing intact.  Gait slow and cautious, no ataxia. Romberg negative.  Montreal Cognitive Assessment  03/06/2019 08/02/2018  Visuospatial/ Executive (0/5) 2 4  Naming (0/3) 3 3  Attention: Read list of digits (0/2) 2 2  Attention: Read list of letters (0/1) 1 0  Attention: Serial 7 subtraction starting at 100 (0/3) 1 1  Language: Repeat phrase (0/2) 2 1  Language : Fluency (0/1) 0 0  Abstraction (0/2) 0 0  Delayed Recall (0/5) 3 2  Orientation (0/6) 6 6  Total 20 19  Adjusted Score (based on education) 21 20    IMPRESSION: This is a 73 yo RH woman with a history of atrial fibrillation on Eliquis, hypothyroidism, anxiety, depression, who had encephalopathy in May 2019 diagnosed as altered mental status in the seeing of high fever,  most likely viral syndrome with delirium. MRI brain, EEG, and lumbar puncture were unremarkable. MOCA score today 21/30 (20/30 in 07/2018). She likely has Mild Cognitive Impairment but is able to function independently with complex tasks as primary caregiver for her husband. She is verbose and disinhibited in the office, which may be underlying personality. She is questioning the diagnosis of neuropathy, we discussed this is likely the cause of her balance issues, which is her main concern today. We discussed that Baclofen is typically used for spasticity but has been reported to help with chronic neuropathic pain, she does not have pain and reports using it to help her relax and sleep. Proceed with PT for balance therapy and vestibular therapy. We again discussed need for help at home, which she declined. Follow-up in 6 months, she knows to call for any changes.   Thank you for allowing me to participate in her care.  Please do not hesitate to call for any questions or concerns.   Ellouise Newer, M.D.   CC: Dr. Isaac Bliss

## 2019-03-06 NOTE — Patient Instructions (Addendum)
1. Refer for Balance and Vestibular Therapy. You will be contacted by their office with an appointment date and time.  2. Follow-up in 6-8 months on 10/06/19 at 10 am. call for any changes

## 2019-03-07 ENCOUNTER — Encounter: Payer: Self-pay | Admitting: Sports Medicine

## 2019-03-07 ENCOUNTER — Encounter: Payer: Self-pay | Admitting: Rehabilitative and Restorative Service Providers"

## 2019-03-07 ENCOUNTER — Ambulatory Visit (INDEPENDENT_AMBULATORY_CARE_PROVIDER_SITE_OTHER): Payer: PPO | Admitting: Sports Medicine

## 2019-03-07 ENCOUNTER — Ambulatory Visit: Payer: PPO | Attending: Neurology | Admitting: Rehabilitative and Restorative Service Providers"

## 2019-03-07 ENCOUNTER — Other Ambulatory Visit: Payer: Self-pay

## 2019-03-07 VITALS — BP 112/66 | Ht 65.5 in | Wt 178.0 lb

## 2019-03-07 DIAGNOSIS — R2681 Unsteadiness on feet: Secondary | ICD-10-CM | POA: Diagnosis not present

## 2019-03-07 DIAGNOSIS — R42 Dizziness and giddiness: Secondary | ICD-10-CM | POA: Insufficient documentation

## 2019-03-07 DIAGNOSIS — R2689 Other abnormalities of gait and mobility: Secondary | ICD-10-CM | POA: Diagnosis not present

## 2019-03-07 DIAGNOSIS — M25552 Pain in left hip: Secondary | ICD-10-CM

## 2019-03-07 DIAGNOSIS — M6281 Muscle weakness (generalized): Secondary | ICD-10-CM | POA: Insufficient documentation

## 2019-03-07 MED ORDER — METHYLPREDNISOLONE ACETATE 40 MG/ML IJ SUSP: 40.0000 mg | Freq: Once | INTRAMUSCULAR | Status: DC

## 2019-03-07 NOTE — Therapy (Signed)
[facility] [address] [place], [place], [address] Phone: [phone]   Fax:  [phone]  Physical Therapy Evaluation  Patient Details  Name: Carly Rhodes MRN: [id number] Date of Birth: 10/05/1945 Referring Provider (PT): Ellouise Newer, MD  CLINIC OPERATION CHANGES: [facility] is open at lower capacity following universal masking, social distancing, and patient screening.  The patient's COVID risk of complications score is 2.  Encounter Date: 03/07/2019  PT End of Session - 03/07/19 1141    Visit Number  1    Number of Visits  4    Date for PT Re-Evaluation  04/21/19    Authorization Type  [employer]    PT Start Time  0804    PT Stop Time  0850    PT Time Calculation (min)  46 min    Activity Tolerance  Patient tolerated treatment well    Behavior During Therapy  [facility] for tasks assessed/performed   needs some redirection during session      Past Medical History:  Diagnosis Date  . Anxiety   . Depression   . Family history of anesthesia complication    sister extreme nausea & vomiting  . 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 Osteoporosis   . Seasonal allergies     Past Surgical History:  Procedure Laterality Date  . ABDOMINAL HYSTERECTOMY  1984  . APPENDECTOMY  1984   at time of Hysterectomy  . HAMMER TOE SURGERY  1993   2nd toe left foot  . THYROID LOBECTOMY  09/15/2012   Procedure: THYROID LOBECTOMY;  Surgeon: Melissa Montane, MD;  Location: [facility];  Service: ENT;  Laterality: Left;  . THYROIDECTOMY, PARTIAL  09/15/2012   Dr Janace Hoard  . TONSILLECTOMY  1957    There were no vitals filed for this visit.   Subjective Assessment - 03/07/19 0752    Subjective  In May, 2019 the patient reports she was near death and saw colors from "the other side".  She notes her family was told that she may not make it.  She reports she couldn't walk after leaving the hospital, but she is walking  now.  "It's more than a miracle that I am where I am."  She reports the neuropathy doesn't feel like it's in her feet, but it's in her head.   On a bad day, she reports difficiulty walking on unlevel surfaces and being unable to turn quickly.  "I can't see anything" or "find anything", but it doesn't make her dizzy.  She denies falls, but does report she cannot walk backwards and sometimes leans into things.    Pertinent History  hypothyroidism, anxiety, depression    Patient Stated Goals  "I would like to be able to walk on surfaces that are different."    Currently in Pain?  Yes    Pain Score  0-No pain    Pain Location  Hip    Pain Orientation  Right    Pain Onset  More than a month ago    Pain Frequency  Intermittent    Aggravating Factors   no pain at rest, but notes unable to sleep on left side         [facility] PT Assessment - 03/07/19 0805      Assessment   Medical Diagnosis  gait instability    Referring Provider (PT)  Ellouise Newer, MD    Onset Date/Surgical Date  --   12/2017   Prior Therapy  recovered on her own "I did therapy myself"      Precautions   Precautions  Fall      Restrictions   Weight Bearing Restrictions  No      Balance Screen   Has the patient fallen in the past 6 months  No    Has the patient had a decrease in activity level because of a fear of falling?   No    Is the patient reluctant to leave their home because of a fear of falling?   No      Home Environment   Living Environment  Private residence    Living Arrangements  Spouse/significant other   she is primary caregiver for her husband   Type of [place] to enter    Entrance Stairs-Number of Steps  12+    Home Layout  Two level    Alternate Level Stairs-Number of Steps  12+    Home Equipment  None    Additional Comments  She holds railings on stairs      Prior Function   Level of Independence  Independent with basic ADLs;Independent with gait;Independent with  transfers    Leisure  Caregiver for husband      Sensation   Light Touch  Appears Intact      ROM / Strength   AROM / PROM / Strength  Strength;AROM      AROM   Overall AROM   Within functional limits for tasks performed      Strength   Overall Strength Comments  4/5 shoulder flexion bilaterally, shoulder abduction bilaterally.  3+/5 bilateral hip flexion, 5/5 bilateral knee flexion/extension and ankle DF.    Able to lift 3/5 into plantar flexion, however ankles roll outward.      Transfers   Transfers  Sit to Stand;Stand to Sit    Sit to Stand  7: Independent    Five time sit to stand comments   13.90 seconds    Stand to Sit  7: Independent      Ambulation/Gait   Ambulation/Gait  Yes    Ambulation/Gait Assistance  7: Independent    Ambulation Distance (Feet)  200 Feet    Assistive device  None    Gait velocity  3.90 ft/sec      Standardized Balance Assessment   Standardized Balance Assessment  Berg Balance Test      Berg Balance Test   Sit to Stand  Able to stand without using hands and stabilize independently    Standing Unsupported  Able to stand safely 2 minutes    Sitting with Back Unsupported but Feet Supported on Floor or Stool  Able to sit safely and securely 2 minutes    Stand to Sit  Sits safely with minimal use of hands    Transfers  Able to transfer safely, minor use of hands    Standing Unsupported with Eyes Closed  Able to stand 10 seconds safely    Standing Unsupported with Feet Together  Able to place feet together independently and stand 1 minute safely    From Standing, Reach Forward with Outstretched Arm  Can reach confidently >25 cm (10")    From Standing Position, Pick up Object from Floor  Able to pick up shoe safely and easily    From Standing Position, Turn to Look Behind Over each Shoulder  Looks behind from both sides and weight shifts well    Turn 360 Degrees  Able to turn 360 degrees safely in 4 seconds or less    Standing Unsupported,  Alternately Place Feet on Step/Stool  Able to stand independently and safely and complete 8 steps in 20 seconds    Standing Unsupported, One Foot in Front  Able to plae foot ahead of the other independently and hold 30 seconds    Standing on One Leg  Able to lift leg independently and hold 5-10 seconds    Total Score  54    Berg comment:  54/56      Functional Gait  Assessment   Gait assessed   Yes    Gait Level Surface  Walks 20 ft in less than 5.5 sec, no assistive devices, good speed, no evidence for imbalance, normal gait pattern, deviates no more than 6 in outside of the 12 in walkway width.    Change in Gait Speed  Able to smoothly change walking speed without loss of balance or gait deviation. Deviate no more than 6 in outside of the 12 in walkway width.    Gait with Horizontal Head Turns  Performs head turns smoothly with no change in gait. Deviates no more than 6 in outside 12 in walkway width    Gait with Vertical Head Turns  Performs head turns with no change in gait. Deviates no more than 6 in outside 12 in walkway width.    Gait and Pivot Turn  Pivot turns safely within 3 sec and stops quickly with no loss of balance.    Step Over Obstacle  Is able to step over 2 stacked shoe boxes taped together (9 in total height) without changing gait speed. No evidence of imbalance.    Gait with Narrow Base of Support  Ambulates 4-7 steps.    Gait with Eyes Closed  Walks 20 ft, no assistive devices, good speed, no evidence of imbalance, normal gait pattern, deviates no more than 6 in outside 12 in walkway width. Ambulates 20 ft in less than 7 sec.    Ambulating Backwards  Walks 20 ft, no assistive devices, good speed, no evidence for imbalance, normal gait    Steps  Alternating feet, must use rail.    Total Score  27    FGA comment:  27/30           Vestibular Assessment - 03/07/19 0809      Vestibular Assessment   General Observation  "It doesn't feel like my feet, it feels like my  head"  (related to neuropathy)      Symptom Behavior   Subjective history of current problem  h/o spinning vertigo with labyrinthitis in 12/2017    Type of Dizziness   --   "I can't find find things"   Frequency of Dizziness  daily    Symptom Nature  Constant    Aggravating Factors  Turning head quickly    Relieving Factors  No known relieving factors    Progression of Symptoms  Better      Oculomotor Exam   Oculomotor Alignment  Abnormal   L eye hypertropia   Ocular ROM  WNLs    Spontaneous  Absent    Gaze-induced   Absent    Smooth Pursuits  Intact    Saccades  Intact      Vestibulo-Ocular Reflex   Comment  Head impulse test=+ bilaterally for refixation saccade          Objective measurements completed on examination: See above findings.  PT Education - 03/07/19 1140    Education Details  HEP    Person(s) Educated  Patient    Methods  Demonstration;Handout;Explanation    Comprehension  Verbalized understanding;Returned demonstration          PT Long Term Goals - 03/07/19 1141      PT LONG TERM GOAL #1   Title  The patient will return demo HEP independently.    Time  4    Period  Weeks    Target Date  04/21/19      PT LONG TERM GOAL #2   Title  The patient will tolerate gaze x 1 adaptation x 30 seconds nonstop to demo improved VOR.    Time  4    Period  Weeks    Target Date  04/06/19      PT LONG TERM GOAL #3   Title  The patient will return to home walking program for ongoing strengthening/stability.    Time  4    Period  Weeks    Target Date  04/06/19             Plan - 03/07/19 1137    Clinical Impression Statement  The patient is a 73 yo female presenting to OP physical therapy with dec'd high level balance, gastrocnemius + hip flexor weakness, and dec'd dynamic gait.  PT also notes core instability with a posterior leaning technique used in standing to balance. The patient does not score in a high fall risk category  and PT was able to initiate a comprehensive HEP today.  Plan to monitor progress with HEP and d/c within 2-3 visits.    Personal Factors and Comorbidities  Comorbidity 1;Comorbidity 2    Comorbidities  anxiety, depression    Examination-Activity Limitations  Locomotion Level;Caring for Others    Examination-Participation Restrictions  Cleaning;Community Activity    Stability/Clinical Decision Making  Stable/Uncomplicated    Clinical Decision Making  Low    Rehab Potential  Good    PT Frequency  1x / week    PT Duration  4 weeks    PT Treatment/Interventions  ADLs/Self Care Home Management;Neuromuscular re-education;Patient/family education;Gait training;Stair training;Functional mobility training;Therapeutic activities;Therapeutic exercise;Balance training    PT Next Visit Plan  Check progress with HEP and progress gaze x 1 to standing; d/c if able to continue HEP independently.    Consulted and Agree with Plan of Care  Patient       Patient will benefit from skilled therapeutic intervention in order to improve the following deficits and impairments:  Abnormal gait, Dizziness, Decreased balance  Visit Diagnosis: 1. Other abnormalities of gait and mobility   2. Unsteadiness on feet   3. Dizziness and giddiness   4. Muscle weakness (generalized)        Problem List Patient Active Problem List   Diagnosis Date Noted  . Abnormality of gait 02/07/2019  . Bunion of great toe of right foot 02/07/2019  . 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 Subclinical hyperthyroidism 06/15/2018  . History of encephalopathy 02/28/2018  . Mild cognitive impairment 01/10/2018  . Headache 01/10/2018  . UTI (urinary tract infection) 12/23/2017  . Right knee pain 10/19/2017  . Posterior tibial tendinitis of right leg 09/28/2017  . Insomnia 05/20/2017  . Incontinence of feces 05/11/2017  . Caregiver stress 05/11/2017  . Vaginal vault prolapse after hysterectomy 05/11/2017  . Vitamin B 12  deficiency 04/09/2017  . Fatigue 04/08/2017  . Acute upper respiratory infection 10/28/2016  . Pain of finger of  right hand 10/08/2016  . Dyslipidemia 12/25/2015  . Osteoarthritis 12/25/2015  . Osteopenia 12/25/2015  . Cystocele with uterine prolapse 01/24/2015  . Effusion of left knee 05/10/2014  . Knee pain, left 11/21/2013  . Gallstones without obstruction of gallbladder 05/31/2013  . Tendinitis of right rotator cuff 02/16/2013  . Pain in joint, shoulder region 12/06/2012  . Wrist pain 04/26/2012  . Thumb pain 04/26/2012  . Herpes simplex virus (HSV) infection 07/05/2009  . NONTOXIC MULTINODULAR GOITER 07/05/2009  . LIPOMA OF OTHER SKIN AND SUBCUTANEOUS TISSUE 08/16/2007  . DYSTHYMIA 08/16/2007  . LOW BACK PAIN SYNDROME 08/16/2007    Amberle Lyter, PT 03/07/2019, 11:43 AM  [facility] [address] [place], [place], [address] Phone: [phone]   Fax:  [phone]  Name: VARSHA KNOCK MRN: [id number] Date of Birth: 08/28/1945

## 2019-03-07 NOTE — Progress Notes (Signed)
PCP: Isaac Bliss, Rayford Halsted, MD  Subjective:   HPI: Patient is a 73 y.o. female here for evaluation of left posterior hip pain.  Her pain has been present for the last several years but has acutely worsened over the last several weeks.  She denies any injury or trauma to her left hip.  The pain is located in the left gluteal region and in her lateral hip. It does not radiate.  She denies any low back pain or pain going down her left leg.  She has no pain on the right side. She does not have any stiffness, locking, or catching of her hip.  She denies any numbness or tingling of her leg.  She denies any bruising or swelling of her hip or thigh.  The pain has an aching quality to it.  Of note patient was hospitalized in May 2019 for encephalopathy due to viral syndrome with associated delirium and post viral labyrinthitis.  Patient was seen by her neurologist yesterday and was referred for physical therapy which she started this morning.  Patient states she does not plan on going back to physical therapy as it brought back many bad memories associated with her hospitalization 1 year ago.  Patient notes that she still feels unsteady with her gait and will occasionally need to catch herself to prevent her from falling.  She notes she is the primary caregiver for her husband and is frequently doing a lot of physical activity because of this.   Review of Systems: See HPI above.  Past Medical History:  Diagnosis Date  . Anxiety   . Depression   . Family history of anesthesia complication    sister extreme nausea & vomiting  . 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 Osteoporosis   . Seasonal allergies     Current Outpatient Medications on File Prior to Visit  Medication Sig Dispense Refill  . acetaminophen (TYLENOL) 325 MG tablet Take 325-650 mg by mouth every 6 (six) hours as needed for headache (pain).    Marland Kitchen acyclovir (ZOVIRAX) 400 MG tablet Take 1 tablet (400 mg total) by mouth every 6 (six)  hours as needed. 30 tablet 2  . ALPRAZolam (XANAX) 0.5 MG tablet TAKE 1/2 TO 1 (ONE-HALF TO ONE) TABLET BY MOUTH THREE TIMES DAILY AS NEEDED 90 tablet 0  . apixaban (ELIQUIS) 5 MG TABS tablet Take 1 tablet (5 mg total) by mouth 2 (two) times daily. 180 tablet 3  . Ascorbic Acid (VITAMIN C PO) Take 1 tablet by mouth daily.    Marland Kitchen b complex vitamins tablet Take 1 tablet by mouth daily.    . baclofen (LIORESAL) 10 MG tablet Take 1 tablet (10 mg total) by mouth at bedtime. 90 tablet 1  . cyanocobalamin (,VITAMIN B-12,) 1000 MCG/ML injection Inject 1 mL (1,000 mcg total) into the muscle every 30 (thirty) days. 1 mL 11  . ezetimibe (ZETIA) 10 MG tablet Take 1 tablet (10 mg total) by mouth daily. 90 tablet 1  . Multiple Vitamin (MULTIVITAMIN WITH MINERALS) TABS tablet Take 1 tablet by mouth daily. Centrum    . NEEDLE, DISP, 25 G (BD ECLIPSE NEEDLE) 25G X 5/8" MISC Use for B12 injections 100 each 3  . Omega-3 Fatty Acids (FISH OIL PO) Take 2 capsules by mouth daily.     No current facility-administered medications on file prior to visit.     Past Surgical History:  Procedure Laterality Date  . ABDOMINAL HYSTERECTOMY  1984  . APPENDECTOMY  1984   at time of Hysterectomy  . HAMMER TOE SURGERY  1993   2nd toe left foot  . THYROID LOBECTOMY  09/15/2012   Procedure: THYROID LOBECTOMY;  Surgeon: Melissa Montane, MD;  Location: [facility];  Service: ENT;  Laterality: Left;  . THYROIDECTOMY, PARTIAL  09/15/2012   Dr Janace Hoard  . TONSILLECTOMY  1957    Allergies  Allergen Reactions  . Amiodarone Other (See Comments)    Hair loss  . Procaine Other (See Comments)    REACTION:  Lowers blood pressure, "makes me loopy, can't move or talk"    Social History   Socioeconomic History  . Marital status: Married    Spouse name: Not on file  . Number of children: Not on file  . Years of education: Not on file  . Highest education level: Not on file  Occupational History  . Not on file  Social Needs  . Financial  resource strain: Not on file  . Food insecurity    Worry: Not on file    Inability: Not on file  . Transportation needs    Medical: Not on file    Non-medical: Not on file  Tobacco Use  . Smoking status: Never Smoker  . Smokeless tobacco: Never Used  Substance and Sexual Activity  . Alcohol use: No  . Drug use: No  . Sexual activity: Not Currently    Birth control/protection: Post-menopausal, Surgical  Lifestyle  . Physical activity    Days per week: Not on file    Minutes per session: Not on file  . Stress: Not on file  Relationships  . Social Herbalist on phone: Not on file    Gets together: Not on file    Attends religious service: Not on file    Active member of club or organization: Not on file    Attends meetings of clubs or organizations: Not on file    Relationship status: Not on file  . Intimate partner violence    Fear of current or ex partner: Not on file    Emotionally abused: Not on file    Physically abused: Not on file    Forced sexual activity: Not on file  Other Topics Concern  . Not on file  Social History Narrative   Pt lives in 2 story home with her husband   Has 2 adult children   High school graduate   Retired Emergency planning/management officer   Current care giver to husband      Right handed    Family History  Problem Relation Age of Onset  . Stomach cancer Brother 4  . Colon cancer Paternal Grandmother 84  . Diabetes Father   . Hypertension Sister   . Heart attack Neg Hx         Objective:  Physical Exam: BP 112/66   Ht 5' 5.5" (1.664 m)   Wt 178 lb (80.7 kg)   BMI 29.17 kg/m  Gen: NAD, comfortable in exam room Lungs: Breathing comfortably on room air Hip Exam Left -Inspection: No deformity, no discoloration -Palpation: SI joint: non-tender; greater trochanter: significant tenderness to palpation. Patient also with tenderness along ischial tuberosity and proximal hamstring -ROM: Normal ROM with flexion, extension, internal rotation,  external rotation -Strength: Flexion: 5/5; abduction extension were 4 out of 5 strength with extension reproducing her pain. -Special Tests: Obers: Negative -Limb neurovascularly intact  Contralateral Hip -Inspection: No deformity, no discoloration -Palpation: SI joint: non-tender; greater trochanter: non-tender -ROM: Normal  ROM with flexion, extension, internal rotation, external rotation -Strength: Flexion: 5/5; Extension 5/5; Abduction: 5/5 -Limb neurovascularly intact    Assessment & Plan:  Patient is a 73 y.o. female here for evaluation of left hip pain  Left Hip Pain -Pain likely multifactorial with a component of left trochanteric bursitis as well as left hip weakness of the piriformis and gluteus minimus. -Patient was offered an injection of her left trochanteric bursa.  Risks and benefits of the procedure were discussed with the patient.  Consent was obtained prior to the procedure.  Patient was injected with a 1 to 4 injection of Depo-Medrol to lidocaine into her left trochanteric bursa.  Patient tolerated the injection well. - Patient was encouraged to continue home exercises that were given to her by physical therapy to help strengthen the pelvic stabilizers  Follow up as needed  I observed and examined the patient with Dr. Sheppard Coil and agree with assessment and plan.  Note reviewed and modified by me. Ila Mcgill, MD

## 2019-03-07 NOTE — Patient Instructions (Signed)
Access Code: [email or web address]  URL: [email or web address]  Date: 03/07/2019  Prepared by: Rudell Cobb   Exercises Seated Gaze Stabilization with Head Rotation - 1 reps - 1 sets - 30 seconds hold - 3x daily - 7x weekly Standing March - 20 reps - 1 sets - 2x daily - 7x weekly Standing Heel Raise - 20 reps - 1 sets - 2x daily - 7x weekly Tandem Walking - 10 reps - 1 sets - 2x daily - 7x weekly

## 2019-03-14 ENCOUNTER — Other Ambulatory Visit: Payer: Self-pay

## 2019-03-14 DIAGNOSIS — R2681 Unsteadiness on feet: Secondary | ICD-10-CM

## 2019-03-14 DIAGNOSIS — M9903 Segmental and somatic dysfunction of lumbar region: Secondary | ICD-10-CM | POA: Diagnosis not present

## 2019-03-14 DIAGNOSIS — M9905 Segmental and somatic dysfunction of pelvic region: Secondary | ICD-10-CM | POA: Diagnosis not present

## 2019-03-14 DIAGNOSIS — M9914 Subluxation complex (vertebral) of sacral region: Secondary | ICD-10-CM | POA: Diagnosis not present

## 2019-03-14 DIAGNOSIS — M5386 Other specified dorsopathies, lumbar region: Secondary | ICD-10-CM | POA: Diagnosis not present

## 2019-03-15 DIAGNOSIS — M9903 Segmental and somatic dysfunction of lumbar region: Secondary | ICD-10-CM | POA: Diagnosis not present

## 2019-03-15 DIAGNOSIS — M5386 Other specified dorsopathies, lumbar region: Secondary | ICD-10-CM | POA: Diagnosis not present

## 2019-03-15 DIAGNOSIS — M9905 Segmental and somatic dysfunction of pelvic region: Secondary | ICD-10-CM | POA: Diagnosis not present

## 2019-03-15 DIAGNOSIS — M9914 Subluxation complex (vertebral) of sacral region: Secondary | ICD-10-CM | POA: Diagnosis not present

## 2019-03-16 DIAGNOSIS — M9914 Subluxation complex (vertebral) of sacral region: Secondary | ICD-10-CM | POA: Diagnosis not present

## 2019-03-16 DIAGNOSIS — M5386 Other specified dorsopathies, lumbar region: Secondary | ICD-10-CM | POA: Diagnosis not present

## 2019-03-16 DIAGNOSIS — M9903 Segmental and somatic dysfunction of lumbar region: Secondary | ICD-10-CM | POA: Diagnosis not present

## 2019-03-16 DIAGNOSIS — M9905 Segmental and somatic dysfunction of pelvic region: Secondary | ICD-10-CM | POA: Diagnosis not present

## 2019-03-20 DIAGNOSIS — M9903 Segmental and somatic dysfunction of lumbar region: Secondary | ICD-10-CM | POA: Diagnosis not present

## 2019-03-20 DIAGNOSIS — M5386 Other specified dorsopathies, lumbar region: Secondary | ICD-10-CM | POA: Diagnosis not present

## 2019-03-20 DIAGNOSIS — M9914 Subluxation complex (vertebral) of sacral region: Secondary | ICD-10-CM | POA: Diagnosis not present

## 2019-03-20 DIAGNOSIS — M9905 Segmental and somatic dysfunction of pelvic region: Secondary | ICD-10-CM | POA: Diagnosis not present

## 2019-03-21 DIAGNOSIS — M9903 Segmental and somatic dysfunction of lumbar region: Secondary | ICD-10-CM | POA: Diagnosis not present

## 2019-03-21 DIAGNOSIS — M5386 Other specified dorsopathies, lumbar region: Secondary | ICD-10-CM | POA: Diagnosis not present

## 2019-03-21 DIAGNOSIS — M9914 Subluxation complex (vertebral) of sacral region: Secondary | ICD-10-CM | POA: Diagnosis not present

## 2019-03-21 DIAGNOSIS — M9905 Segmental and somatic dysfunction of pelvic region: Secondary | ICD-10-CM | POA: Diagnosis not present

## 2019-03-22 DIAGNOSIS — M9905 Segmental and somatic dysfunction of pelvic region: Secondary | ICD-10-CM | POA: Diagnosis not present

## 2019-03-22 DIAGNOSIS — M9914 Subluxation complex (vertebral) of sacral region: Secondary | ICD-10-CM | POA: Diagnosis not present

## 2019-03-22 DIAGNOSIS — M9903 Segmental and somatic dysfunction of lumbar region: Secondary | ICD-10-CM | POA: Diagnosis not present

## 2019-03-22 DIAGNOSIS — M5386 Other specified dorsopathies, lumbar region: Secondary | ICD-10-CM | POA: Diagnosis not present

## 2019-03-25 IMAGING — MR MR BRAIN/IAC WO/W
13 of 15 series · 36 of 48 positions shown · IV contrast (Multihance)
Comparison: MRI head 01/11/2018, CT 01/12/2018

CLINICAL DATA: Severe vertigo.  Acute onset nystagmus, nausea

EXAM:
MRI HEAD WITHOUT AND WITH CONTRAST
TECHNIQUE: Multiplanar, multiecho pulse sequences of the brain and surrounding
structures were obtained without and with intravenous contrast.
CONTRAST:  15mL MULTIHANCE GADOBENATE DIMEGLUMINE 529 MG/ML IV SOLN

[Series 9: T2 · axial · 4.0mm · 0.72mm/px · z∈[-43,+97]mm · 3 of 29 slices shown (1 of 2)]
[im 1/29]
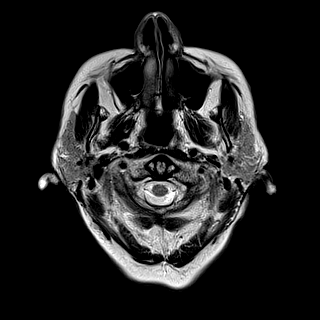
[im 15/29]
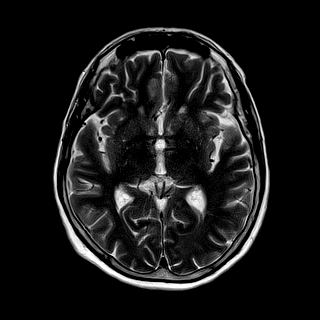
[im 29/29]
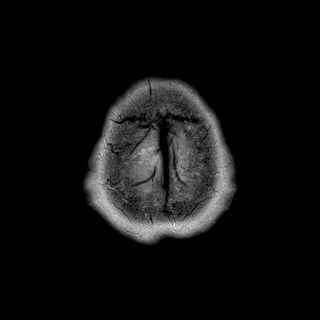

[Series 10: ax dwi_tracew · axial · 3.0mm · 1.44mm/px · z∈[-46,+94]mm · 7 of 80 slices shown]
[im 1/80]
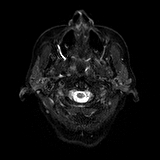
[im 14/80]
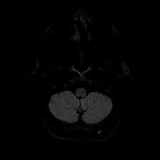
[im 27/80]
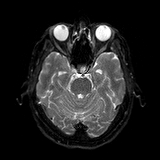
[im 40/80]
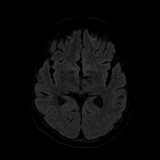
[im 53/80]
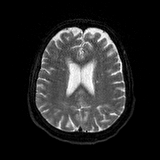
[im 66/80]
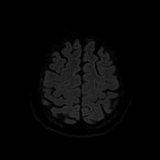
[im 80/80]
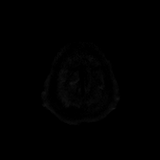

[Series 11: ax dwi_adc · axial · 3.0mm · 1.44mm/px · z∈[-46,+94]mm · 4 of 40 slices shown]
[im 1/40]
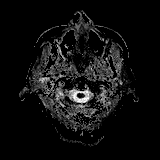
[im 14/40]
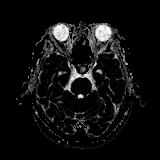
[im 27/40]
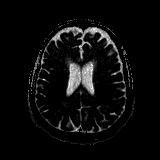
[im 40/40]
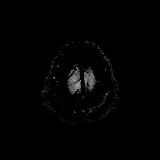

[Series 12: T2 · coronal · 5.0mm · 0.72mm/px · 2 of 25 slices shown (2 of 2)]
[im 1/25]
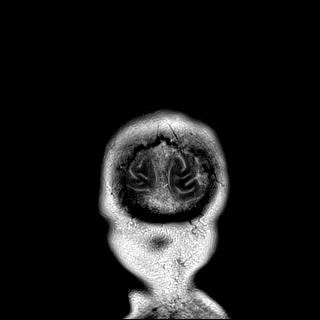
[im 25/25]
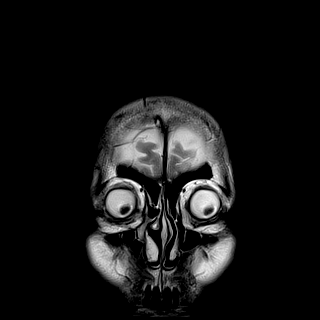

[Series 13: T1 · sagittal · 5.0mm · 0.72mm/px · 2 of 20 slices shown]
[im 1/20]
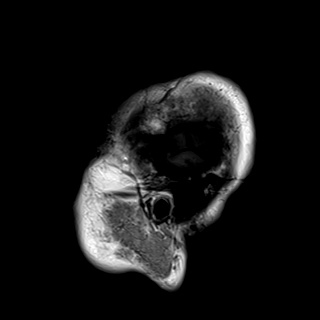
[im 20/20]
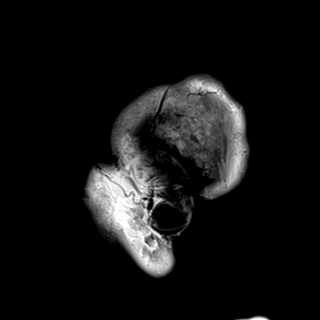

[Series 14: FLAIR · axial · 4.0mm · 0.45mm/px · z∈[-43,+97]mm · 3 of 29 slices shown]
[im 1/29]
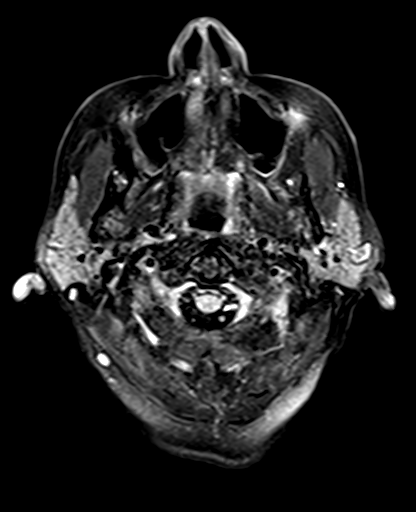
[im 15/29]
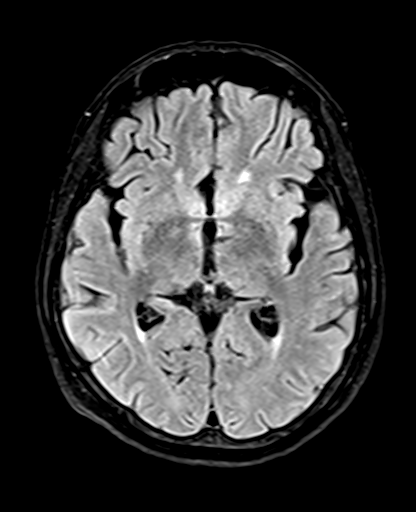
[im 29/29]
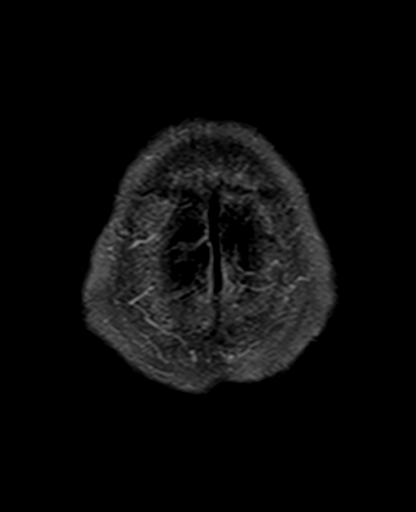

[Series 15: t2_fl2d_tra_4mm_hemo · axial · 4.0mm · 0.90mm/px · z∈[-43,+97]mm · 3 of 29 slices shown]
[im 1/29]
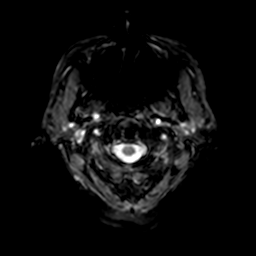
[im 15/29]
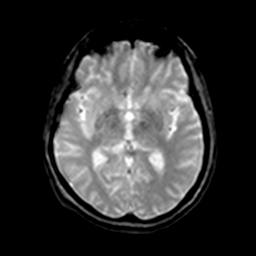
[im 29/29]
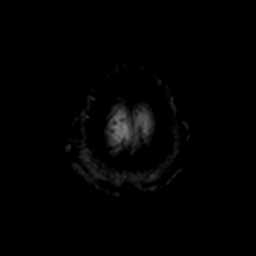

[Series 17: t2_(id)_tra_iso · axial · 0.6mm · 0.56mm/px · z∈[-42,-5]mm · 6 of 64 slices shown]
[im 1/64]
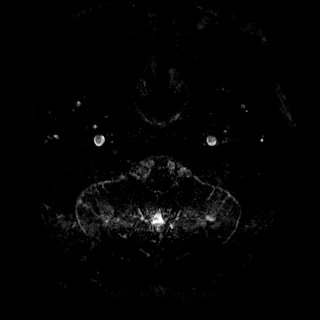
[im 13/64]
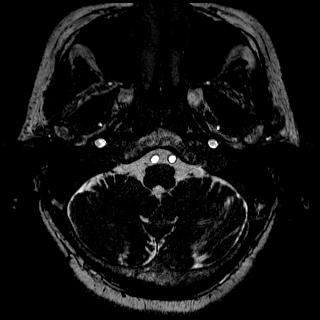
[im 26/64]
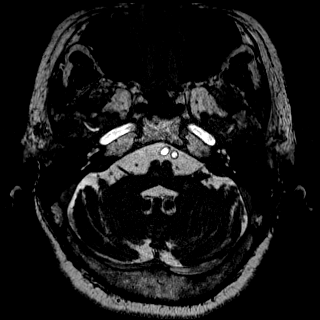
[im 38/64]
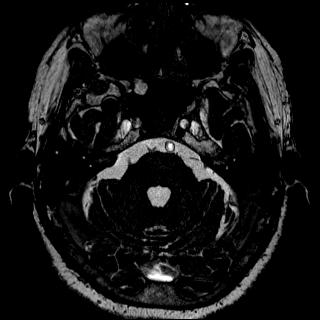
[im 51/64]
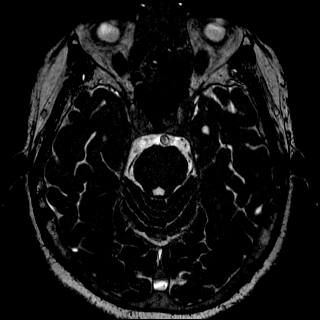
[im 64/64]
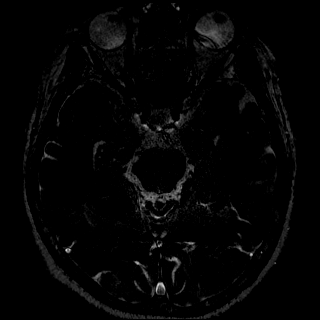

[Series 18: t1_tse_tra_3mm · axial · 3.0mm · 0.37mm/px · 1 of 15 slices shown (1 of 2)]
[im 1/15]
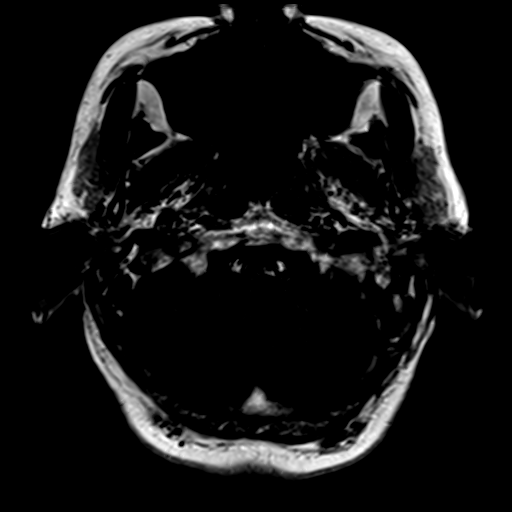

[Series 19: t1_tse_r_cor_3mm · coronal · 3.0mm · 0.33mm/px · 1 of 13 slices shown (1 of 2)]
[im 1/13]
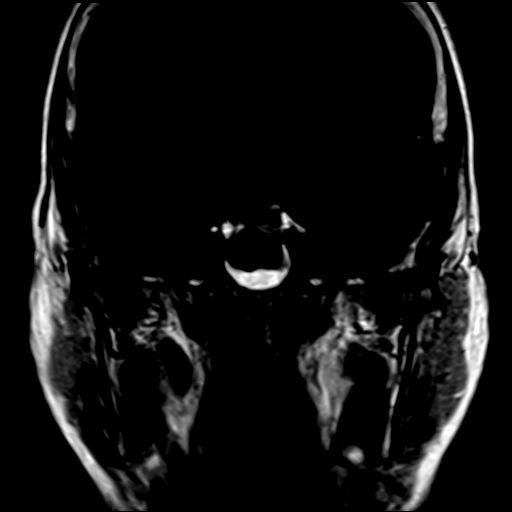

[Series 20: t1_tse_tra_3mm · axial · 3.0mm · 0.37mm/px · 1 of 15 slices shown (2 of 2)]
[im 1/15]
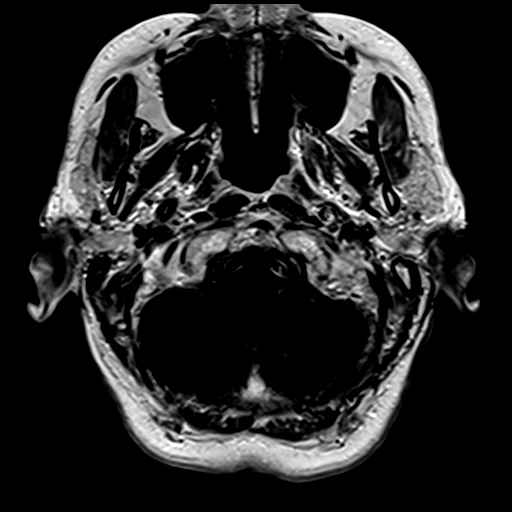

[Series 23: T1 post-contrast · coronal · 5.0mm · 0.34mm/px · 2 of 25 slices shown]
[im 1/25]
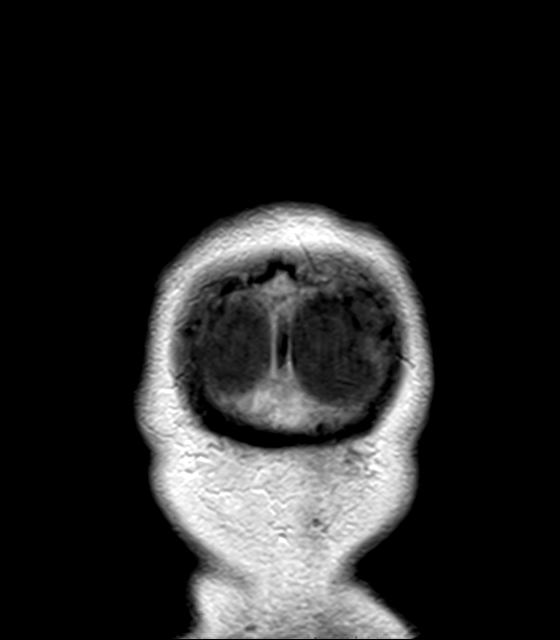
[im 25/25]
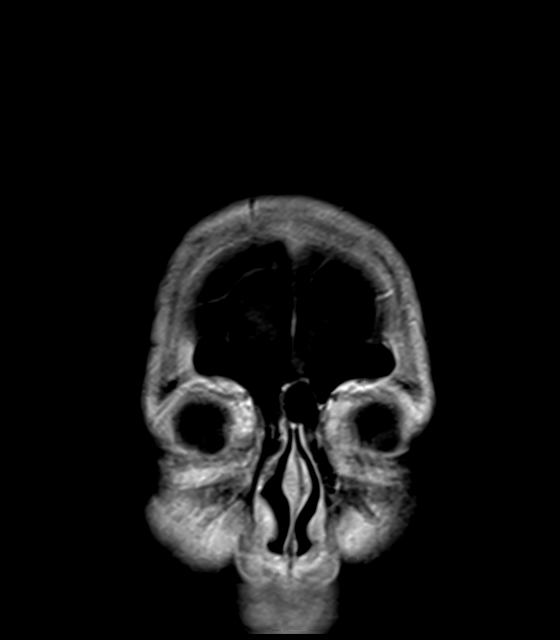

[Series 24: t1_tse_r_cor_3mm · coronal · 3.0mm · 0.33mm/px · 1 of 13 slices shown (2 of 2)]
[im 1/13]
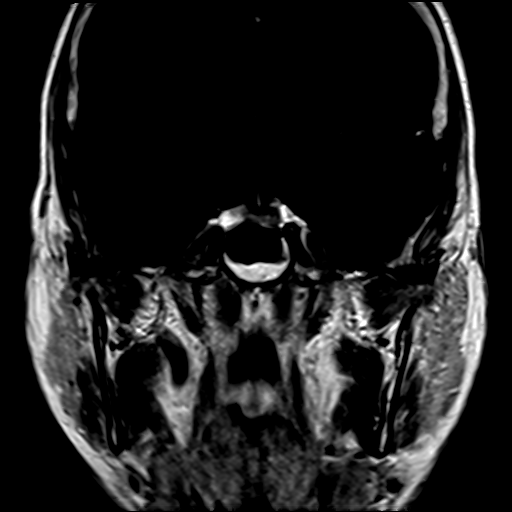

[36 of 48 positions shown; findings below may reference images not displayed]

FINDINGS: Brain: IAC protocol was performed including thin section imaging
through the posterior fossa before and after intravenous contrast.

Seventh and eighth cranial nerves normal. No enhancing mass lesion.
Basilar cisterns normal. Mastoid sinus is clear bilaterally.
Brainstem and cerebellum normal. No enhancing mass in the temporal
bone or posterior fossa. Normal venous enhancement.

Mild atrophy. Ventricle size normal. Negative for acute infarct.
Mild chronic microvascular ischemic change in the white matter.
Negative for hemorrhage or mass.

Vascular: Normal arterial flow void

Skull and upper cervical spine: Negative

Sinuses/Orbits: Mild mucosal edema paranasal sinuses.  Normal orbit

Other: None
IMPRESSION: No cause for vertigo or nystagmus identified. Negative for acute
infarct or mass.

Mild chronic microvascular ischemic change in the white matter.

## 2019-03-28 ENCOUNTER — Encounter

## 2019-03-30 ENCOUNTER — Ambulatory Visit: Payer: PPO | Admitting: Sports Medicine

## 2019-03-30 ENCOUNTER — Other Ambulatory Visit: Payer: Self-pay

## 2019-03-30 ENCOUNTER — Ambulatory Visit
Admission: RE | Admit: 2019-03-30 | Discharge: 2019-03-30 | Disposition: A | Discharge status: Home / Self Care | Payer: PPO | Source: Ambulatory Visit | Attending: Sports Medicine | Admitting: Sports Medicine

## 2019-03-30 ENCOUNTER — Other Ambulatory Visit: Payer: Self-pay | Admitting: Internal Medicine

## 2019-03-30 VITALS — BP 132/90 | Ht 65.5 in | Wt 178.0 lb

## 2019-03-30 DIAGNOSIS — E785 Hyperlipidemia, unspecified: Secondary | ICD-10-CM

## 2019-03-30 DIAGNOSIS — M25552 Pain in left hip: Secondary | ICD-10-CM

## 2019-03-30 DIAGNOSIS — M1612 Unilateral primary osteoarthritis, left hip: Secondary | ICD-10-CM | POA: Insufficient documentation

## 2019-03-30 MED ORDER — MELOXICAM 7.5 MG PO TABS: 7.5000 mg | ORAL_TABLET | Freq: Every day | ORAL | 1 refills | Status: DC

## 2019-03-30 MED ORDER — FAMOTIDINE 20 MG PO TABS: 20.0000 mg | ORAL_TABLET | Freq: Every day | ORAL | 1 refills | Status: DC

## 2019-03-30 NOTE — Assessment & Plan Note (Signed)
Pain most likely due to arthritis although hip has full ROM. MRI in 2006 was positive for mild degenerative changes of the left hip. -We will prescribe meloxicam 7.5mg  daily for anti-inflammatory treatment.  As patient is also on Eliquis, will also prescribe famotidine to prevent GI bleed. -Patient can continue to take Tylenol every 6 hrs as needed for pain -We will order x-ray of left hip -Ice hip 20 minutes 2-3 times daily -Patient given range of motion exercises for both hips, including making small circles, swinging the leg front and back, and swinging the leg in and out.  Exercises to be performed for 3 sets of 10 daily.

## 2019-03-30 NOTE — Patient Instructions (Signed)
Go get your hip x-ray as soon as you leave our office. Do the exercises we provided today. We sent in two prescriptions for you to begin taking daily. Ice your hip for 20 minutes 2-3 times a day. Use tylenol as needed for pain.

## 2019-03-30 NOTE — Progress Notes (Signed)
PCP: Isaac Bliss, Rayford Halsted, MD  Subjective:   HPI: Patient is a 73 y.o. female here for f/u Left Hip Pain.  The patient states her hip pain has now shifted to the front of her leg/groin and as she often has pain that spreads down the front part of her thigh.  She states that 2 or 3 weeks ago she also had "a pinched nerve" in her left lower back/pelvis which was addressed by a chiropractor.  She has not been performing any exercises at home.  She has to take 3 Tylenol to relieve her pain or Tylenol with codeine.  She is constantly icing her hip to decrease the pain.  She states she cannot walk up and down the steps unless she goes sideways and leaves with her right leg.  She is requesting an MRI of her left hip and low spine.  Denies fevers, chest pain, abdominal pain, and loss of sensation to her lower extremities.  In 2006 she had MRI of the hips that showed only mild DJD  Pertinent soc hx:  Patient's husband put in hospital for [place] last night and under some stress  Past Medical History:  Diagnosis Date  . Anxiety   . Depression   . Family history of anesthesia complication    sister extreme nausea & vomiting  . 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 Osteoporosis   . Seasonal allergies     Current Outpatient Medications on File Prior to Visit  Medication Sig Dispense Refill  . acetaminophen (TYLENOL) 325 MG tablet Take 325-650 mg by mouth every 6 (six) hours as needed for headache (pain).    Marland Kitchen acyclovir (ZOVIRAX) 400 MG tablet Take 1 tablet (400 mg total) by mouth every 6 (six) hours as needed. 30 tablet 2  . ALPRAZolam (XANAX) 0.5 MG tablet TAKE 1/2 TO 1 (ONE-HALF TO ONE) TABLET BY MOUTH THREE TIMES DAILY AS NEEDED 90 tablet 0  . apixaban (ELIQUIS) 5 MG TABS tablet Take 1 tablet (5 mg total) by mouth 2 (two) times daily. 180 tablet 3  . Ascorbic Acid (VITAMIN C PO) Take 1 tablet by mouth daily.    Marland Kitchen b complex vitamins tablet Take 1 tablet by mouth daily.    . baclofen  (LIORESAL) 10 MG tablet Take 1 tablet (10 mg total) by mouth at bedtime. 90 tablet 1  . cyanocobalamin (,VITAMIN B-12,) 1000 MCG/ML injection Inject 1 mL (1,000 mcg total) into the muscle every 30 (thirty) days. 1 mL 11  . ezetimibe (ZETIA) 10 MG tablet Take 1 tablet (10 mg total) by mouth daily. 90 tablet 1  . Multiple Vitamin (MULTIVITAMIN WITH MINERALS) TABS tablet Take 1 tablet by mouth daily. Centrum    . NEEDLE, DISP, 25 G (BD ECLIPSE NEEDLE) 25G X 5/8" MISC Use for B12 injections 100 each 3  . Omega-3 Fatty Acids (FISH OIL PO) Take 2 capsules by mouth daily.     Current Facility-Administered Medications on File Prior to Visit  Medication Dose Route Frequency Provider Last Rate Last Dose  . methylPREDNISolone acetate (DEPO-MEDROL) injection 40 mg  40 mg Intra-articular Once Stefanie Libel, MD        Past Surgical History:  Procedure Laterality Date  . ABDOMINAL HYSTERECTOMY  1984  . APPENDECTOMY  1984   at time of Hysterectomy  . HAMMER TOE SURGERY  1993   2nd toe left foot  . THYROID LOBECTOMY  09/15/2012   Procedure: THYROID LOBECTOMY;  Surgeon: Melissa Montane, MD;  Location:  [facility] OR;  Service: ENT;  Laterality: Left;  . THYROIDECTOMY, PARTIAL  09/15/2012   Dr Janace Hoard  . TONSILLECTOMY  1957    Allergies  Allergen Reactions  . Amiodarone Other (See Comments)    Hair loss  . Procaine Other (See Comments)    REACTION:  Lowers blood pressure, "makes me loopy, can't move or talk"    Social History   Socioeconomic History  . Marital status: Married    Spouse name: Not on file  . Number of children: Not on file  . Years of education: Not on file  . Highest education level: Not on file  Occupational History  . Not on file  Social Needs  . Financial resource strain: Not on file  . Food insecurity    Worry: Not on file    Inability: Not on file  . Transportation needs    Medical: Not on file    Non-medical: Not on file  Tobacco Use  . Smoking status: Never Smoker  . Smokeless  tobacco: Never Used  Substance and Sexual Activity  . Alcohol use: No  . Drug use: No  . Sexual activity: Not Currently    Birth control/protection: Post-menopausal, Surgical  Lifestyle  . Physical activity    Days per week: Not on file    Minutes per session: Not on file  . Stress: Not on file  Relationships  . Social Herbalist on phone: Not on file    Gets together: Not on file    Attends religious service: Not on file    Active member of club or organization: Not on file    Attends meetings of clubs or organizations: Not on file    Relationship status: Not on file  . Intimate partner violence    Fear of current or ex partner: Not on file    Emotionally abused: Not on file    Physically abused: Not on file    Forced sexual activity: Not on file  Other Topics Concern  . Not on file  Social History Narrative   Pt lives in 2 story home with her husband   Has 2 adult children   High school graduate   Retired Emergency planning/management officer   Current care giver to husband      Right handed    Family History  Problem Relation Age of Onset  . Stomach cancer Brother 25  . Colon cancer Paternal Grandmother 79  . Diabetes Father   . Hypertension Sister   . Heart attack Neg Hx     BP 132/90   Ht 5' 5.5" (1.664 m)   Wt 178 lb (80.7 kg)   BMI 29.17 kg/m   Review of Systems: See HPI above.     Objective:  Physical Exam:  Gen: NAD, comfortable in exam room Lungs: Breathing comfortably on room air Hip Exam Left -Inspection: No deformity, no discoloration -Palpation: No tenderness to palpation elicited -ROM: Normal ROM with flexion, extension, internal rotation, external rotation (total rotation of 60 deg bilaterally) -Strength: good for hip abduction and hip flexion/ qaud -Special Tests: pain elicited with FABER, limited mobility with FABER negative straight leg raise -Limb neurovascularly intact  Right Hip -Inspection: No deformity, no discoloration -Palpation: no  TTP elicited -ROM: Normal ROM with flexion, extension, internal rotation, external rotation -Strength: not assessed -Special Tests: negative straight leg raise -Limb neurovascularly intact    Assessment & Plan:  1. Left hip pain: pain most likely due to arthritis although  hip has full ROM. MRI in 2006 was positive for mild degenerative changes of the left hip. -We will prescribe meloxicam 7.5 mg daily for anti-inflammatory treatment.  As patient is also on Eliquis, will also prescribe famotidine to prevent GI bleed. -Patient can continue to take Tylenol every 6 hours as needed for pain -We will order x-ray of left hip -Ice hip 20 minutes 2-3 times daily -Patient given range of motion exercises for both hips, including making small circles, swinging the leg front and back, and swinging the leg in and out.  Exercises to be performed for 3 sets of 10 daily.  Follow-up pending results of hip x-ray.  Milus Banister, [place], PGY-2 03/30/2019 2:35 PM   XR of left hip reviewed and patient has some mild OA but no other significant findings. We will continue plan for conservative care. I observed and examined the patient with the resident and agree with assessment and plan.  Note reviewed and modified by me.  Ila Mcgill, MD

## 2019-03-31 ENCOUNTER — Ambulatory Visit: Payer: PPO | Admitting: Rehabilitative and Restorative Service Providers"

## 2019-04-04 DIAGNOSIS — M9905 Segmental and somatic dysfunction of pelvic region: Secondary | ICD-10-CM | POA: Diagnosis not present

## 2019-04-04 DIAGNOSIS — M5386 Other specified dorsopathies, lumbar region: Secondary | ICD-10-CM | POA: Diagnosis not present

## 2019-04-04 DIAGNOSIS — M9903 Segmental and somatic dysfunction of lumbar region: Secondary | ICD-10-CM | POA: Diagnosis not present

## 2019-04-04 DIAGNOSIS — M9914 Subluxation complex (vertebral) of sacral region: Secondary | ICD-10-CM | POA: Diagnosis not present

## 2019-04-19 ENCOUNTER — Telehealth: Payer: Self-pay | Admitting: Internal Medicine

## 2019-04-19 ENCOUNTER — Other Ambulatory Visit: Payer: Self-pay

## 2019-04-19 ENCOUNTER — Telehealth (INDEPENDENT_AMBULATORY_CARE_PROVIDER_SITE_OTHER): Payer: PPO | Admitting: Internal Medicine

## 2019-04-19 ENCOUNTER — Telehealth: Payer: Self-pay

## 2019-04-19 ENCOUNTER — Other Ambulatory Visit: Payer: Self-pay | Admitting: Internal Medicine

## 2019-04-19 DIAGNOSIS — F341 Dysthymic disorder: Secondary | ICD-10-CM

## 2019-04-19 DIAGNOSIS — G3184 Mild cognitive impairment, so stated: Secondary | ICD-10-CM | POA: Diagnosis not present

## 2019-04-19 DIAGNOSIS — M545 Low back pain, unspecified: Secondary | ICD-10-CM

## 2019-04-19 DIAGNOSIS — G4700 Insomnia, unspecified: Secondary | ICD-10-CM | POA: Diagnosis not present

## 2019-04-19 NOTE — Telephone Encounter (Signed)
Copied from [place] [id number]. Topic: Appointment Scheduling - Transfer of Care >> Apr 19, 2019  2:40 PM Sheran Luz wrote: Pt is requesting to transfer FROM: Dr. Jerilee Hoh Pt is requesting to transfer TO: Debbrah Alar Reason for requested transfer: She said Dr. Jerilee Hoh is mean.

## 2019-04-19 NOTE — Telephone Encounter (Signed)
Carly Rhodes - Please advise on pt's request to transfer care. Thank you!

## 2019-04-19 NOTE — Progress Notes (Signed)
Virtual Visit via Telephone Note  I connected with Carly Rhodes on 04/19/19 at  2:00 PM EDT by telephone and verified that I am speaking with the correct person using two identifiers.   I discussed the limitations, risks, security and privacy concerns of performing an evaluation and management service by telephone and the availability of in person appointments. I also discussed with the patient that there may be a patient responsible charge related to this service. The patient expressed understanding and agreed to proceed.  Location patient: home Location provider: work office Participants present for the call: patient, provider Patient did not have a visit in the prior 7 days to address this/these issue(s).   History of Present Illness:  She has scheduled this phone consultation as we would not authorize an early refill of her xanax. She states that she has been taking 2 and sometimes 3 of the 0.5 mg Xanax instead of the 0.5 to 1 tablet that she had disclosed during her visit in March. We had agreed to continue prior Rx of Xanax for #90 tabs for 3 months. She requested an early refill on July 2nd during another phone consultation. She stated then that while cleaning her kitchen she somehow lost her bottle. I did authorize an early refill then and explained that these 90 tabs should last a minimum of 90 days. This is day 60. Patient then became argumentative and confrontational when I explained to her the agreement that we had. When I told her that during her visit in March she had disclosed taking half to 1 full tablet at night, she states "well, you must have documented that incorrectly". When I asked her what happened in July when we gave her an early refill due to her stating she had lost her medications: "I don't know where you are getting that information from, I never told you that". "You are not a good doctor and you are not very considerate with your patients." "I need you to find  me a new doctor". "How can I put in a complaint?".   Observations/Objective: Patient sounds cheerful and well on the phone. I do not appreciate any increased work of breathing. Speech and thought processing are grossly intact. Patient reported vitals: none reported   Current Outpatient Medications:  .  acetaminophen (TYLENOL) 325 MG tablet, Take 325-650 mg by mouth every 6 (six) hours as needed for headache (pain)., Disp: , Rfl:  .  acyclovir (ZOVIRAX) 400 MG tablet, Take 1 tablet (400 mg total) by mouth every 6 (six) hours as needed., Disp: 30 tablet, Rfl: 2 .  ALPRAZolam (XANAX) 0.5 MG tablet, TAKE 1/2 TO 1 (ONE-HALF TO ONE) TABLET BY MOUTH THREE TIMES DAILY AS NEEDED, Disp: 90 tablet, Rfl: 0 .  apixaban (ELIQUIS) 5 MG TABS tablet, Take 1 tablet (5 mg total) by mouth 2 (two) times daily., Disp: 180 tablet, Rfl: 3 .  Ascorbic Acid (VITAMIN C PO), Take 1 tablet by mouth daily., Disp: , Rfl:  .  b complex vitamins tablet, Take 1 tablet by mouth daily., Disp: , Rfl:  .  baclofen (LIORESAL) 10 MG tablet, Take 1 tablet (10 mg total) by mouth at bedtime., Disp: 90 tablet, Rfl: 1 .  cyanocobalamin (,VITAMIN B-12,) 1000 MCG/ML injection, Inject 1 mL (1,000 mcg total) into the muscle every 30 (thirty) days., Disp: 1 mL, Rfl: 11 .  ezetimibe (ZETIA) 10 MG tablet, Take 1 tablet by mouth once daily, Disp: 90 tablet, Rfl: 1 .  famotidine (  PEPCID) 20 MG tablet, Take 1 tablet (20 mg total) by mouth daily., Disp: 30 tablet, Rfl: 1 .  meloxicam (MOBIC) 7.5 MG tablet, Take 1 tablet (7.5 mg total) by mouth daily., Disp: 30 tablet, Rfl: 1 .  Multiple Vitamin (MULTIVITAMIN WITH MINERALS) TABS tablet, Take 1 tablet by mouth daily. Centrum, Disp: , Rfl:  .  NEEDLE, DISP, 25 G (BD ECLIPSE NEEDLE) 25G X 5/8" MISC, Use for B12 injections, Disp: 100 each, Rfl: 3 .  Omega-3 Fatty Acids (FISH OIL PO), Take 2 capsules by mouth daily., Disp: , Rfl:   Current Facility-Administered Medications:  .  methylPREDNISolone  acetate (DEPO-MEDROL) injection 40 mg, 40 mg, Intra-articular, Once, Stefanie Libel, MD  Review of Systems:  Constitutional: Denies fever, chills, diaphoresis, appetite change and fatigue.  HEENT: Denies photophobia, eye pain, redness, hearing loss, ear pain, congestion, sore throat, rhinorrhea, sneezing, mouth sores, trouble swallowing, neck pain, neck stiffness and tinnitus.   Respiratory: Denies SOB, DOE, cough, chest tightness,  and wheezing.   Cardiovascular: Denies chest pain, palpitations and leg swelling.  Gastrointestinal: Denies nausea, vomiting, abdominal pain, diarrhea, constipation, blood in stool and abdominal distention.  Genitourinary: Denies dysuria, urgency, frequency, hematuria, flank pain and difficulty urinating.  Endocrine: Denies: hot or cold intolerance, sweats, changes in hair or nails, polyuria, polydipsia. Musculoskeletal: Denies myalgias, back pain, joint swelling, arthralgias and gait problem.  Skin: Denies pallor, rash and wound.  Neurological: Denies dizziness, seizures, syncope, weakness, light-headedness, numbness and headaches.  Hematological: Denies adenopathy. Easy bruising, personal or family bleeding history  Psychiatric/Behavioral: Denies suicidal ideation, mood changes, confusion, nervousness, sleep disturbance and agitation   Assessment and Plan:  Insomnia, unspecified type  Mild cognitive impairment  -I believe that it would be in her best interest to seek care from another PCP. I am no longer willing to prescribe controlled substances to her.   I discussed the assessment and treatment plan with the patient. The patient was provided an opportunity to ask questions and all were answered. The patient agreed with the plan and demonstrated an understanding of the instructions.   The patient was advised to call back or seek an in-person evaluation if the symptoms worsen or if the condition fails to improve as anticipated.  I provided 23 minutes of  non-face-to-face time during this encounter.   Lelon Frohlich, MD  Primary Care at [facility]

## 2019-04-19 NOTE — Telephone Encounter (Signed)
Ok with me 

## 2019-04-19 NOTE — Telephone Encounter (Signed)
Pt is very upset about the way a provider (Dr. Jerilee Hoh) treated her and the whole facility on a whole.  She wanted to file a complaint with someone higher and wanted to speak with someone today.  Pt is aware that the Government social research officer is in a meeting and that someone will call her as soon as they could.  She wanted me to a sure her that someone will call here within the next few minutes I told her that I could not do that because I do not know what time they will be out of their meeting.

## 2019-04-19 NOTE — Telephone Encounter (Signed)
Dr. Jerilee Hoh - Please advise on pt's request to transfer care. Thank you!

## 2019-04-20 NOTE — Telephone Encounter (Signed)
I called and spoke to this patient. She was upset and stated that Dr. Jerilee Hoh was mean to her. I asked her for more details. She stated that she was upset when Dr. Jerilee Hoh asked her about her hip pain because it was none of her business. She also stated that it was rude of Dr. Jerilee Hoh to question how many xanax that she was taking. She stated that she has been taking these since she was 39 and only uses them to sleep. She stated that she does not remember losing her pills last year. 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 It caused her to forget things. She then stated that Dr. Jerilee Hoh called her a liar. She said that she knew that Dr. Jerilee Hoh didn't care about her patients after the first visit when she was told that she didn't need a pap smear or a colonoscopy. She was also told at the first visit that she did not need to be taken xanax. I explained to the patient that Dr. Jerilee Hoh was explaining the guidelines of patient care for her age group and she needs to manage all aspects of patient care including her hip. She then stated that she was not going to discuss this any further because I was obviously on the side of Dr. Jerilee Hoh. She stated that she asked if she could transfer to another [facility] office and was irritated when she was told that they would have to ask for Dr. Jerilee Hoh permission. I explained to her that is our process for all transfers because our providers cover each other when on call. She then asked if she could transfer to another [facility] office. I gave her the [phone] Find a [facility] provider number. She thanked me for my time and we ended the call.

## 2019-04-21 ENCOUNTER — Other Ambulatory Visit: Payer: Self-pay | Admitting: Internal Medicine

## 2019-04-21 DIAGNOSIS — Z8679 Personal history of other diseases of the circulatory system: Secondary | ICD-10-CM | POA: Diagnosis not present

## 2019-04-21 DIAGNOSIS — E782 Mixed hyperlipidemia: Secondary | ICD-10-CM | POA: Diagnosis not present

## 2019-04-21 DIAGNOSIS — G479 Sleep disorder, unspecified: Secondary | ICD-10-CM | POA: Diagnosis not present

## 2019-04-21 DIAGNOSIS — M1611 Unilateral primary osteoarthritis, right hip: Secondary | ICD-10-CM | POA: Diagnosis not present

## 2019-04-21 DIAGNOSIS — K219 Gastro-esophageal reflux disease without esophagitis: Secondary | ICD-10-CM | POA: Diagnosis not present

## 2019-04-21 DIAGNOSIS — Z8661 Personal history of infections of the central nervous system: Secondary | ICD-10-CM | POA: Diagnosis not present

## 2019-04-21 DIAGNOSIS — E538 Deficiency of other specified B group vitamins: Secondary | ICD-10-CM

## 2019-04-28 ENCOUNTER — Telehealth: Payer: Self-pay | Admitting: Internal Medicine

## 2019-04-28 NOTE — Telephone Encounter (Signed)
I spoke to the patient who called because last night 9/10 she was assisting her husband and bent down to pick something off of the floor and experienced a "sharp, knife stabbing" pain in the center of her chest, lasting a split second.  She felt nothing in the jaw or arms.  This was a one time occurrence.    I told her to monitor and if it occurs again to please call and arrange an OV.  She verbalized understanding and will continue to monitor.

## 2019-04-28 NOTE — Telephone Encounter (Signed)
LMTCB

## 2019-04-28 NOTE — Telephone Encounter (Signed)
Pt c/o of Chest Pain: STAT if CP now or developed within 24 hours  1. Are you having CP right now?  No- last nigh had a sharp pain in her chest like a knife- it did not last long  2. Are you experiencing any other symptoms (ex. SOB, nausea, vomiting, sweating)? no  3. How long have you been experiencing CP? She had only one episode last nigh  4. Is your CP continuous or coming and going? One time only- it did not come back  5. Have you taken Nitroglycerin? no ?

## 2019-05-02 ENCOUNTER — Other Ambulatory Visit: Payer: Self-pay

## 2019-05-02 ENCOUNTER — Ambulatory Visit: Payer: PPO | Admitting: Sports Medicine

## 2019-05-02 DIAGNOSIS — M1612 Unilateral primary osteoarthritis, left hip: Secondary | ICD-10-CM

## 2019-05-02 DIAGNOSIS — F419 Anxiety disorder, unspecified: Secondary | ICD-10-CM | POA: Diagnosis not present

## 2019-05-02 MED ORDER — METHYLPREDNISOLONE ACETATE 80 MG/ML IJ SUSP
80.0000 mg | Freq: Once | INTRAMUSCULAR | Status: AC
  Administered 2019-05-02: 80 mg via INTRAMUSCULAR

## 2019-05-02 NOTE — Assessment & Plan Note (Signed)
Note her OA is likely worse than shown on XR since this was present on MRI in 2006  Today given 80 Mg solumedrol IM - she is not compliant with oral meds D/W her IA hip injection under [place] and if she does not get relief we will go that route  Start easy ROM exercises for left hip  Reck 3 mos

## 2019-05-02 NOTE — Patient Instructions (Signed)
[facility] [phone] Sela Hilding, MD

## 2019-05-02 NOTE — Progress Notes (Signed)
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 Less Able to do a step up on 8 in step

## 2019-05-04 ENCOUNTER — Ambulatory Visit (INDEPENDENT_AMBULATORY_CARE_PROVIDER_SITE_OTHER): Payer: PPO | Admitting: Sports Medicine

## 2019-05-04 ENCOUNTER — Other Ambulatory Visit: Payer: Self-pay

## 2019-05-04 VITALS — BP 135/80 | Ht 65.5 in | Wt 178.0 lb

## 2019-05-04 DIAGNOSIS — M25552 Pain in left hip: Secondary | ICD-10-CM

## 2019-05-04 DIAGNOSIS — M1612 Unilateral primary osteoarthritis, left hip: Secondary | ICD-10-CM | POA: Diagnosis not present

## 2019-05-04 DIAGNOSIS — Z8679 Personal history of other diseases of the circulatory system: Secondary | ICD-10-CM | POA: Diagnosis not present

## 2019-05-04 DIAGNOSIS — E782 Mixed hyperlipidemia: Secondary | ICD-10-CM | POA: Diagnosis not present

## 2019-05-04 MED ORDER — ACETAMINOPHEN-CODEINE #3 300-30 MG PO TABS: 1.0000 | ORAL_TABLET | ORAL | Status: DC | PRN

## 2019-05-04 MED ORDER — ACETAMINOPHEN-CODEINE #3 300-30 MG PO TABS: 1.0000 | ORAL_TABLET | ORAL | 0 refills | Status: DC | PRN

## 2019-05-04 NOTE — Progress Notes (Signed)
CC: Left hip Pain  Patient seen 2 days ago and given 80 mg IM solumedrol She persists with pain deep in hip Unable to sleep x 2 nights Unable to walk up stairs Cares for elderly husband and having difficulty doing that  ROS No LBP No sciatica  PE BP 135/80   Ht 5' 5.5" (1.664 m)   Wt 178 lb (80.7 kg)   BMI 29.17 kg/m  Generally looks in discomfort and tired  Exam not repeated  XR show moderate left hip OA

## 2019-05-04 NOTE — Assessment & Plan Note (Signed)
With her progressive pain pattern and inability to get any relief in spite of CSI will plan:  MRI left hip Gets surprisingly good relief from Tylenol #3 in past and will use regular tylenol Supplement breakthrough pain with T3  Check MRI to be sure not something more serious causing the pain

## 2019-05-04 NOTE — Patient Instructions (Signed)
During the day use the tylenol arthritis formula - One or two tablets up to 3 times per day Use this if working well enough for pain  If not working well enough at evening take 1 Tyleonl #3 plus 1 regular tylenol to see if that works better  We will schedule an MRI of your left hip  I will want to see you to go over this after it is done

## 2019-05-05 ENCOUNTER — Ambulatory Visit: Payer: PPO | Admitting: Family Medicine

## 2019-05-05 NOTE — Telephone Encounter (Signed)
SWP and she has decided to go outside of [facility] for primary care.  Pt stated she was unhappy with care given by Dr. Jerilee Hoh and was not happy about being told she had to have "permission" to switch providers.  I explained to pt this a courtesy done between offices/providers anytime a pt requests a TOC.  Pt was appreciative of my further explanation and thanked me for my time in explaining this to her.

## 2019-05-09 ENCOUNTER — Encounter: Payer: Self-pay | Admitting: Neurology

## 2019-05-16 ENCOUNTER — Ambulatory Visit: Payer: PPO | Admitting: Neurology

## 2019-05-17 ENCOUNTER — Ambulatory Visit: Payer: PPO | Admitting: Neurology

## 2019-05-20 ENCOUNTER — Ambulatory Visit
Admission: RE | Admit: 2019-05-20 | Discharge: 2019-05-20 | Disposition: A | Discharge status: Home / Self Care | Payer: PPO | Source: Ambulatory Visit | Attending: Sports Medicine | Admitting: Sports Medicine

## 2019-05-20 ENCOUNTER — Other Ambulatory Visit: Payer: Self-pay

## 2019-05-20 DIAGNOSIS — M1612 Unilateral primary osteoarthritis, left hip: Secondary | ICD-10-CM | POA: Diagnosis not present

## 2019-05-20 DIAGNOSIS — R600 Localized edema: Secondary | ICD-10-CM | POA: Diagnosis not present

## 2019-05-20 DIAGNOSIS — M25552 Pain in left hip: Secondary | ICD-10-CM

## 2019-05-22 ENCOUNTER — Telehealth (INDEPENDENT_AMBULATORY_CARE_PROVIDER_SITE_OTHER): Payer: PPO | Admitting: Neurology

## 2019-05-22 ENCOUNTER — Other Ambulatory Visit: Payer: Self-pay

## 2019-05-22 ENCOUNTER — Ambulatory Visit: Payer: PPO | Admitting: Neurology

## 2019-05-22 DIAGNOSIS — F5104 Psychophysiologic insomnia: Secondary | ICD-10-CM

## 2019-05-22 DIAGNOSIS — F419 Anxiety disorder, unspecified: Secondary | ICD-10-CM

## 2019-05-22 MED ORDER — TRAZODONE HCL 50 MG PO TABS: ORAL_TABLET | ORAL | 11 refills | Status: DC

## 2019-05-22 NOTE — Progress Notes (Signed)
Virtual Visit via Telephone Note The purpose of this virtual visit is to provide medical care while limiting exposure to the novel coronavirus.    Consent was obtained for phone visit:  Yes.   Answered questions that patient had about telehealth interaction:  Yes.   I discussed the limitations, risks, security and privacy concerns of performing an evaluation and management service by telephone. I also discussed with the patient that there may be a patient responsible charge related to this service. The patient expressed understanding and agreed to proceed.  Pt location: Home Physician Location: office Name of referring provider:  Isaac Rhodes, Estel* I connected with .Carly Rhodes at patients initiation/request on 05/22/2019 at 11:30 AM EDT by telephone and verified that I am speaking with the correct person using two identifiers.  Pt MRN:  [id number] Pt DOB:  Jul 01, 1946   History of Present Illness:  The patient had a telephone visit on 05/22/2019. She was last seen in the neurology clinic 3 months ago. She requested for an earlier visit asking if there is any supplement to help her brain. On further questioning, she reports that she did not find out until last Friday that her husband is going to be in hospice. She has been his primary caregiver, and continues to be in this role, but states her "brain is in so much stress." She feels like her body is not going to make it. She feels like to has to go on, but want to hold her head, "hold it tight to stay there." She denies any pain, but "I found out that after last summer I have some brain damage," and feels that she came out really well from it. She can tell that the stress really hurts, she repeats that "it makes me think I may not make it." No suicidal ideation. She states that when she does extra stuff with her brain, "I won't make it." She reports a lot of anxiety and poor sleep, she has a lot of hip pain which also hinders sleep. She  sees Dr. Oneida Rhodes for hip pain and had an MRI recently.   History on Initial Assessment 08/02/2018: This is a 73 year old right-handed woman with a history of atrial fibrillation on Eliquis, hypothyroidism, anxiety, depression, presenting for evaluation after hospitalization for encephalopathy. She was under the impression that she had a brain infection at that time and wonders is the brain infection is causing her fatigue. Records from her hospitalization in May 2019 were reviewed and it was clarified with the patient that she did not have a brain infection during her hospital admission. She was evaluated by Neurology and had an unremarkable MRI brain, EEG, and lumbar puncture, diagnosis of altered mental status in the setting of high fever, most likely explanation is viral syndrome with delirium with post-viral labyrinthitis. She presented to the hospital for headaches and diffuse weakness leading to a fall. She had not been eating for a few days. Her husband was unable to assist her off the floor. She woke up the next morning with speech difficulty, she could understand people but could not speak. She was febrile on arrival to the ED and was noted to be able to give history but show signs of altered mental status. She was found to have atrial fibrillation with RVR and started on Eliquis. I personally reviewed MRI brain with and without contrast done 01/13/18 which did not show any acute changes, there was mild diffuse atrophy, mild chronic microvascular disease.  Cuts through the IACs showed normal 7th and 8th cranial nerves, no enhancing mass in temporal region. Her EEG was normal. She had a lumbar puncture with CSF showing WBC of 2, RBC 4400 in tub 1, 515 in tube 4, normal protein, glucose, negative HSV 1 and 2, negative CSF culture. She was treated empirically with acyclovir, ceftriaxone, and ampicillin, which were discontinued when cultures came back negative. During her stay, she developed vertigo and  persistent right-beating nystagmus with rotary component in all directions, consistent with labyrinthitis. She described illusions/delusions during her stay.   She denies any further headaches, vertigo, or hallucinations since her hospitalization in May 2019. Her main concern is her balance. She states most of the time it is good, but other times it is so bad, especially in an enclosed area. She has not fallen but has had to hold on to objects. She states most of the time she can correct herself. She denies any focal numbness/tingling/weakness, neck/back pain, bowel/bladder dysfunction. She states she does not have headaches, but a place in the back of her head feels funny at times then hurts, she takes a Tylenol with good effect. She denies any vertigo, diplopia, dysarthria/dysphagia. She has noticed cognitive difficulties since her hospitalization in May. She is the primary caregiver of her 9 year old husband. They have been married for 3 years. She states she looks after him and does everything for him. He has a scooter for transfers, she does not need to help him to get around, but she bathes him once a week. She knows she needs help but does not know how to do it since "everything is so personal." She manages finances and has had more trouble since coming out of the hospital. She manages her husband's medications and states she does not forget them, but sometimes forgets her own medication. If she is unsure if she took the Eliquis, she would not take it that day.     Observations/Objective:  Limited due to nature of phone visit. Patient is again verbose and difficult to redirect, similar to prior visit. She is ruminating on her stress with being her husband's primary caregiver.    Assessment and Plan:   This is a 73 yo RH woman with a history of atrial fibrillation on Eliquis, hypothyroidism, anxiety, depression, who had encephalopathy in May 2019 diagnosed as altered mental status in the seeing of  high fever, most likely viral syndrome with delirium. MRI brain, EEG, and lumbar puncture were unremarkable. She has mild cognitive impairment, likely with significant contribution from anxiety/stress of caregiver role for her husband who is now in hospice. She is verbose and ruminates on her stress and inability to sleep. She is agreeable to starting Trazodone 50mg  1/2 tab qhs to help with sleep and mood, side effects discussed. May increase to 1 tab qhs if necessary. She was advised to speak to the grief counselor with hospice as well. Follow-up as scheduled in February 2021.    Follow Up Instructions:   -I discussed the assessment and treatment plan with the patient. The patient was provided an opportunity to ask questions and all were answered. The patient agreed with the plan and demonstrated an understanding of the instructions.   The patient was advised to call back or seek an in-person evaluation if the symptoms worsen or if the condition fails to improve as anticipated.    Total Time spent in visit with the patient was:  12 minutes, of which 100% of the time was  spent in counseling and/or coordinating care on the above.   Pt understands and agrees with the plan of care outlined.     Cameron Sprang, MD

## 2019-05-23 ENCOUNTER — Encounter: Payer: Self-pay | Admitting: Sports Medicine

## 2019-05-23 ENCOUNTER — Ambulatory Visit (INDEPENDENT_AMBULATORY_CARE_PROVIDER_SITE_OTHER): Payer: PPO | Admitting: Sports Medicine

## 2019-05-23 DIAGNOSIS — M1612 Unilateral primary osteoarthritis, left hip: Secondary | ICD-10-CM | POA: Diagnosis not present

## 2019-05-23 MED ORDER — ACETAMINOPHEN-CODEINE #3 300-30 MG PO TABS: ORAL_TABLET | ORAL | 0 refills | Status: DC

## 2019-05-23 MED ORDER — METHYLPREDNISOLONE ACETATE 40 MG/ML IJ SUSP
40.0000 mg | Freq: Once | INTRAMUSCULAR | Status: AC
  Administered 2019-05-23: 40 mg via INTRA_ARTICULAR

## 2019-05-23 NOTE — Addendum Note (Signed)
Addended by: Cyd Silence on: 05/23/2019 10:29 AM   Modules accepted: Orders

## 2019-05-23 NOTE — Assessment & Plan Note (Signed)
New MRI 05/20/19 shows active inflammatory change at bursa  Procedure:  Injection of left GT bursa Consent obtained and verified. Time-out conducted. Noted no overlying erythema, induration, or other signs of local infection. Skin prepped in a sterile fashion. Topical analgesic spray: Ethyl chloride. Completed without difficulty. Meds:1 cc slumedrol 40 plus 4 cc lidocaine 1% Pain immediately improved suggesting accurate placement of the medication. Advised to call if fevers/chills, erythema, induration, drainage, or persistent bleeding.  Patient felt some immediate relief  Will follow for symptom relief and see back as needed.

## 2019-05-23 NOTE — Progress Notes (Signed)
Cc: left hip pain  Continuing left hip pain Surrounds the hip Husband just went onto hospice She is standing on feet more and caring for him Unable to sleep as pain wakes her  MRI on 10/3 Left trochanteric bursitis Glut Med tendinopathy Only mild degenerative change Anterior labral tear likely  Plan D/W patient Will try 2 tylenol # 3 at night as this has typically helped her To come for office injection today CSI has helped in the past. Recheck

## 2019-05-25 ENCOUNTER — Ambulatory Visit: Payer: PPO | Admitting: Sports Medicine

## 2019-05-25 NOTE — Telephone Encounter (Signed)
Spoke with [employer]. Fax could not be located, she asked if ok to send by email. My email provided and the email forwarded to Bella Kennedy and Theophilus Kinds for review/response.

## 2019-05-25 NOTE — Telephone Encounter (Signed)
Shataun calling from [employer] would like a call back from he office manager regarding a fax that she sent over on 05/22/19 regarding a grievance.

## 2019-05-25 NOTE — Telephone Encounter (Signed)
I will send a response to [employer] by email.

## 2019-05-26 DIAGNOSIS — N3001 Acute cystitis with hematuria: Secondary | ICD-10-CM | POA: Diagnosis not present

## 2019-05-30 ENCOUNTER — Ambulatory Visit: Payer: Self-pay | Admitting: Internal Medicine

## 2019-06-21 DIAGNOSIS — Z23 Encounter for immunization: Secondary | ICD-10-CM | POA: Diagnosis not present

## 2019-06-22 ENCOUNTER — Other Ambulatory Visit: Payer: Self-pay | Admitting: Sports Medicine

## 2019-06-22 MED ORDER — ACETAMINOPHEN-CODEINE #3 300-30 MG PO TABS: ORAL_TABLET | ORAL | 0 refills | Status: DC

## 2019-06-29 ENCOUNTER — Other Ambulatory Visit: Payer: Self-pay

## 2019-06-29 ENCOUNTER — Ambulatory Visit: Payer: PPO | Admitting: Sports Medicine

## 2019-06-29 DIAGNOSIS — M1612 Unilateral primary osteoarthritis, left hip: Secondary | ICD-10-CM | POA: Diagnosis not present

## 2019-06-29 NOTE — Progress Notes (Signed)
CC; Left hip pain  For past 3 to 4 mos hip has hurt worse on left In past had GT pain that responded to CSI This time CSI did not help XR and MRI reveal mild DJD and she did have some GT bursitis and Glut med tendinitis  Tyl #3 and regular tylenol 1 each at night have gotten her to 5 hours sleep Able to stop using cane Still to painful to go up steps on left leg  Stress in Life Husband died last month Lots of work caring for him and little rest Got away from any exercise  ROS Not associated with LBP No real weakness in left leg RT hip non painful  PE Older F who appears sad and mildly stressed BP 118/72   Ht 5' 5.5" (1.664 m)   Wt 178 lb (80.7 kg)   BMI 29.17 kg/m   Hip ROM on left is slightlyt decreased but preserved Now able to abduct and flex hip much better Walking gait with less limp and no real trendelenburg Can do essentially full ROM although sligthly < than on RT Not confident stepping up with LT leg

## 2019-06-29 NOTE — Patient Instructions (Signed)
For exercise you can do the following: -elliptical on low resistance -stationary bike on low resistance -the hip exercises shown to you at today's visit  Do these for a few months 4-5 days a week at least.  See [place] back if you're not getting better.

## 2019-06-29 NOTE — Assessment & Plan Note (Signed)
I think if she is able to get back on HEP she will improve Cont Tyl 3 at night if needed for pain Start a simple series of hip motion Some easy abduction, flexion and ext exercises Ok to bike or elliptical  Reck in 2 mos

## 2019-07-04 DIAGNOSIS — M25552 Pain in left hip: Secondary | ICD-10-CM | POA: Diagnosis not present

## 2019-07-04 DIAGNOSIS — G479 Sleep disorder, unspecified: Secondary | ICD-10-CM | POA: Diagnosis not present

## 2019-07-08 ENCOUNTER — Other Ambulatory Visit: Payer: Self-pay | Admitting: Internal Medicine

## 2019-07-10 NOTE — Telephone Encounter (Signed)
Age 73, weight 81kg, SCr 0.86 on 11/02/18, afib indication, last OV May 2020

## 2019-07-18 ENCOUNTER — Other Ambulatory Visit: Payer: Self-pay

## 2019-07-18 ENCOUNTER — Encounter: Payer: Self-pay | Admitting: Gastroenterology

## 2019-07-18 ENCOUNTER — Encounter (HOSPITAL_COMMUNITY): Payer: Self-pay | Admitting: Emergency Medicine

## 2019-07-18 ENCOUNTER — Emergency Department (HOSPITAL_COMMUNITY): Payer: PPO

## 2019-07-18 ENCOUNTER — Inpatient Hospital Stay (HOSPITAL_COMMUNITY)
Admission: EM | Admit: 2019-07-18 | Discharge: 2019-07-23 | DRG: 418 | Disposition: A | Discharge status: Home / Self Care | Payer: PPO | Attending: Family Medicine | Admitting: Family Medicine

## 2019-07-18 DIAGNOSIS — R52 Pain, unspecified: Secondary | ICD-10-CM | POA: Diagnosis not present

## 2019-07-18 DIAGNOSIS — R1013 Epigastric pain: Secondary | ICD-10-CM | POA: Diagnosis not present

## 2019-07-18 DIAGNOSIS — R109 Unspecified abdominal pain: Secondary | ICD-10-CM | POA: Diagnosis present

## 2019-07-18 DIAGNOSIS — K8050 Calculus of bile duct without cholangitis or cholecystitis without obstruction: Secondary | ICD-10-CM

## 2019-07-18 DIAGNOSIS — E559 Vitamin D deficiency, unspecified: Secondary | ICD-10-CM | POA: Diagnosis not present

## 2019-07-18 DIAGNOSIS — K8071 Calculus of gallbladder and bile duct without cholecystitis with obstruction: Secondary | ICD-10-CM | POA: Diagnosis not present

## 2019-07-18 DIAGNOSIS — I480 Paroxysmal atrial fibrillation: Secondary | ICD-10-CM | POA: Diagnosis not present

## 2019-07-18 DIAGNOSIS — K8020 Calculus of gallbladder without cholecystitis without obstruction: Secondary | ICD-10-CM | POA: Diagnosis present

## 2019-07-18 DIAGNOSIS — Z7901 Long term (current) use of anticoagulants: Secondary | ICD-10-CM | POA: Diagnosis not present

## 2019-07-18 DIAGNOSIS — Z20828 Contact with and (suspected) exposure to other viral communicable diseases: Secondary | ICD-10-CM | POA: Diagnosis not present

## 2019-07-18 DIAGNOSIS — R000 Tachycardia, unspecified: Secondary | ICD-10-CM | POA: Diagnosis not present

## 2019-07-18 DIAGNOSIS — K760 Fatty (change of) liver, not elsewhere classified: Secondary | ICD-10-CM | POA: Diagnosis present

## 2019-07-18 DIAGNOSIS — Z8661 Personal history of infections of the central nervous system: Secondary | ICD-10-CM | POA: Diagnosis not present

## 2019-07-18 DIAGNOSIS — F418 Other specified anxiety disorders: Secondary | ICD-10-CM | POA: Diagnosis not present

## 2019-07-18 DIAGNOSIS — R945 Abnormal results of liver function studies: Secondary | ICD-10-CM | POA: Diagnosis present

## 2019-07-18 DIAGNOSIS — I482 Chronic atrial fibrillation, unspecified: Secondary | ICD-10-CM | POA: Diagnosis present

## 2019-07-18 DIAGNOSIS — K8021 Calculus of gallbladder without cholecystitis with obstruction: Secondary | ICD-10-CM

## 2019-07-18 DIAGNOSIS — Z634 Disappearance and death of family member: Secondary | ICD-10-CM | POA: Diagnosis not present

## 2019-07-18 DIAGNOSIS — K838 Other specified diseases of biliary tract: Secondary | ICD-10-CM | POA: Diagnosis not present

## 2019-07-18 DIAGNOSIS — K8070 Calculus of gallbladder and bile duct without cholecystitis without obstruction: Secondary | ICD-10-CM | POA: Diagnosis not present

## 2019-07-18 DIAGNOSIS — R1084 Generalized abdominal pain: Secondary | ICD-10-CM | POA: Diagnosis not present

## 2019-07-18 DIAGNOSIS — R110 Nausea: Secondary | ICD-10-CM | POA: Diagnosis not present

## 2019-07-18 DIAGNOSIS — E785 Hyperlipidemia, unspecified: Secondary | ICD-10-CM | POA: Diagnosis not present

## 2019-07-18 DIAGNOSIS — I10 Essential (primary) hypertension: Secondary | ICD-10-CM | POA: Diagnosis not present

## 2019-07-18 DIAGNOSIS — I4891 Unspecified atrial fibrillation: Secondary | ICD-10-CM | POA: Diagnosis not present

## 2019-07-18 DIAGNOSIS — R1114 Bilious vomiting: Secondary | ICD-10-CM

## 2019-07-18 DIAGNOSIS — Z03818 Encounter for observation for suspected exposure to other biological agents ruled out: Secondary | ICD-10-CM | POA: Diagnosis not present

## 2019-07-18 DIAGNOSIS — G629 Polyneuropathy, unspecified: Secondary | ICD-10-CM | POA: Diagnosis not present

## 2019-07-18 DIAGNOSIS — Z800 Family history of malignant neoplasm of digestive organs: Secondary | ICD-10-CM

## 2019-07-18 DIAGNOSIS — K8010 Calculus of gallbladder with chronic cholecystitis without obstruction: Secondary | ICD-10-CM | POA: Diagnosis not present

## 2019-07-18 DIAGNOSIS — E02 Subclinical iodine-deficiency hypothyroidism: Secondary | ICD-10-CM | POA: Diagnosis not present

## 2019-07-18 DIAGNOSIS — Z79899 Other long term (current) drug therapy: Secondary | ICD-10-CM

## 2019-07-18 DIAGNOSIS — R1111 Vomiting without nausea: Secondary | ICD-10-CM | POA: Diagnosis not present

## 2019-07-18 DIAGNOSIS — R7989 Other specified abnormal findings of blood chemistry: Secondary | ICD-10-CM | POA: Diagnosis present

## 2019-07-18 HISTORY — DX: Unspecified atrial fibrillation: I48.91

## 2019-07-18 HISTORY — DX: Cardiac arrhythmia, unspecified: I49.9

## 2019-07-18 LAB — COMPREHENSIVE METABOLIC PANEL
ALT: 542 U/L — ABNORMAL HIGH (ref 0–44)
AST: 476 U/L — ABNORMAL HIGH (ref 15–41)
Albumin: 4.3 g/dL (ref 3.5–5.0)
Alkaline Phosphatase: 213 U/L — ABNORMAL HIGH (ref 38–126)
Anion gap: 13 (ref 5–15)
BUN: 10 mg/dL (ref 8–23)
CO2: 22 mmol/L (ref 22–32)
Calcium: 9.6 mg/dL (ref 8.9–10.3)
Chloride: 105 mmol/L (ref 98–111)
Creatinine, Ser: 0.87 mg/dL (ref 0.44–1.00)
GFR calc Af Amer: >60 mL/min (ref >60)
GFR calc non Af Amer: >60 mL/min (ref >60)
Glucose, Bld: 117 mg/dL — ABNORMAL HIGH (ref 70–99)
Potassium: 3.8 mmol/L (ref 3.5–5.1)
Sodium: 140 mmol/L (ref 135–145)
Total Bilirubin: 2.9 mg/dL — ABNORMAL HIGH (ref 0.3–1.2)
Total Protein: 7.1 g/dL (ref 6.5–8.1)

## 2019-07-18 LAB — CBC
HCT: 43.8 % (ref 36.0–46.0)
Hemoglobin: 14.4 g/dL (ref 12.0–15.0)
MCH: 29.4 pg (ref 26.0–34.0)
MCHC: 32.9 g/dL (ref 30.0–36.0)
MCV: 89.4 fL (ref 80.0–100.0)
Platelets: 205 10*3/uL (ref 150–400)
RBC: 4.9 MIL/uL (ref 3.87–5.11)
RDW: 13.9 % (ref 11.5–15.5)
WBC: 12.6 10*3/uL — ABNORMAL HIGH (ref 4.0–10.5)
nRBC: 0 % (ref 0.0–0.2)

## 2019-07-18 LAB — LIPASE, BLOOD: Lipase: 59 U/L — ABNORMAL HIGH (ref 11–51)

## 2019-07-18 MED ORDER — SODIUM CHLORIDE 0.9 % IV BOLUS
1000.0000 mL | Freq: Once | INTRAVENOUS | Status: AC
  Administered 2019-07-18: 1000 mL via INTRAVENOUS

## 2019-07-18 MED ORDER — HYDROMORPHONE HCL 1 MG/ML IJ SOLN
0.5000 mg | Freq: Once | INTRAMUSCULAR | Status: AC
  Administered 2019-07-18: 0.5 mg via INTRAVENOUS
  Filled 2019-07-18: qty 1

## 2019-07-18 MED ORDER — ONDANSETRON HCL 4 MG/2ML IJ SOLN
4.0000 mg | Freq: Once | INTRAMUSCULAR | Status: AC
  Administered 2019-07-18: 4 mg via INTRAVENOUS
  Filled 2019-07-18: qty 2

## 2019-07-18 MED ORDER — SODIUM CHLORIDE 0.9% FLUSH: 3.0000 mL | Freq: Once | INTRAVENOUS | Status: DC

## 2019-07-18 MED ORDER — LACTATED RINGERS IV SOLN
INTRAVENOUS | Status: AC
  Administered 2019-07-19: 02:00:00 via INTRAVENOUS

## 2019-07-18 MED ORDER — MORPHINE SULFATE (PF) 4 MG/ML IV SOLN
4.0000 mg | Freq: Once | INTRAVENOUS | Status: AC
  Administered 2019-07-18: 4 mg via INTRAVENOUS
  Filled 2019-07-18: qty 1

## 2019-07-18 MED ORDER — LACTATED RINGERS IV BOLUS
1000.0000 mL | Freq: Once | INTRAVENOUS | Status: AC
  Administered 2019-07-19: 1000 mL via INTRAVENOUS

## 2019-07-18 NOTE — ED Triage Notes (Signed)
Pt arrives to ED from home with complaints of epigastric abdominal pain x1 day that has not gone away. Patient states this has been happening on and off for weeks now. Patients states that pain radiates to her back. Patient states after she vomits pain usually goes away.

## 2019-07-18 NOTE — ED Notes (Signed)
Patient pale in triage. Patient complaining of worsening pain. Soil scientist.

## 2019-07-18 NOTE — ED Notes (Signed)
[place] pts sister, wants an update. Call for pick up as well

## 2019-07-18 NOTE — ED Provider Notes (Signed)
[facility] EMERGENCY DEPARTMENT Provider Note   CSN: [id number] Arrival date & time: 07/18/19  1716     History   Chief Complaint Chief Complaint  Patient presents with  . Abdominal Pain  . Back Pain    HPI Carly Rhodes is a 73 y.o. female with past medical history of anxiety, depression, encephalopathy, atrial fibrillation on Eliquis, hypothyroidism,  Gallstones without obstruction (2014) who presents with epigastric pain.  Patient states her pain began about a month ago was worse with eating.  She noticed eating smaller amounts of food helped the pain.  Pain became progressively more frequent even with small meals and she presented today with pain which didn't go away.  Had one episode of emesis last night.       HPI   History reviewed. No pertinent past medical history.  There are no active problems to display for this patient.      OB History   No obstetric history on file.      Home Medications    Prior to Admission medications   Medication Sig Start Date End Date Taking? Authorizing Provider  acetaminophen-codeine (TYLENOL #3) 300-30 MG tablet Take 2 tablets by mouth at bedtime. 06/22/19  Yes [provider]  ALPRAZolam Duanne Moron) 0.5 MG tablet Take 0.5 mg by mouth at bedtime.   Yes [provider]  baclofen (LIORESAL) 10 MG tablet Take 20 mg by mouth at bedtime. 07/05/19  Yes [provider]  cyanocobalamin (,VITAMIN B-12,) 1000 MCG/ML injection Inject 1 mL into the muscle every 30 (thirty) days. 07/05/19  Yes [provider]  ELIQUIS 5 MG TABS tablet Take 5 mg by mouth 2 (two) times daily. 07/10/19  Yes [provider]  ezetimibe (ZETIA) 10 MG tablet Take 10 mg by mouth daily.   Yes [provider]  traZODone (DESYREL) 50 MG tablet Take 50 mg by mouth at bedtime.   Yes [provider]    Family History History reviewed. No pertinent family history.  Social History Social  History   Tobacco Use  . Smoking status: Not on file  Substance Use Topics  . Alcohol use: Not on file  . Drug use: Not on file     Allergies   Patient has no known allergies.   Review of Systems Review of Systems  Gastrointestinal: Positive for abdominal pain and nausea. Negative for diarrhea.  Genitourinary: Negative for dysuria and frequency.  Musculoskeletal: Positive for arthralgias.  All other systems reviewed and are negative.    Physical Exam Updated Vital Signs BP 91/60 (BP Location: Right Arm)   Pulse (!) 110   Temp 98.9 F (37.2 C) (Oral)   Resp 18   SpO2 96%   Physical Exam Constitutional:      General: She is in acute distress.  HENT:     Head: Normocephalic and atraumatic.  Eyes:     General: No scleral icterus. Cardiovascular:     Rate and Rhythm: Regular rhythm. Tachycardia present.  Pulmonary:     Effort: No respiratory distress.     Breath sounds: Normal breath sounds.  Abdominal:     General: Bowel sounds are normal. There is no distension.     Palpations: There is no hepatomegaly.     Tenderness: There is abdominal tenderness. Positive signs include Murphy's sign.  Skin:    General: Skin is warm and dry.  Neurological:     General: No focal deficit present.     Mental Status: She  is alert.  Psychiatric:        Mood and Affect: Mood is anxious.     Comments: Affect: Anxious      ED Treatments / Results  Labs (all labs ordered are listed, but only abnormal results are displayed) Labs Reviewed  LIPASE, BLOOD - Abnormal; Notable for the following components:      Result Value   Lipase 59 (*)    All other components within normal limits  COMPREHENSIVE METABOLIC PANEL - Abnormal; Notable for the following components:   Glucose, Bld 117 (*)    AST 476 (*)    ALT 542 (*)    Alkaline Phosphatase 213 (*)    Total Bilirubin 2.9 (*)    All other components within normal limits  CBC - Abnormal; Notable for the following components:    WBC 12.6 (*)    All other components within normal limits  SARS CORONAVIRUS 2 (TAT 6-24 HRS)  URINALYSIS, ROUTINE W REFLEX MICROSCOPIC    EKG EKG Interpretation  Date/Time:  Tuesday July 18 2019 17:56:51 EST Ventricular Rate:  107 PR Interval:  136 QRS Duration: 88 QT Interval:  328 QTC Calculation: 437 R Axis:   57 Text Interpretation: Sinus tachycardia Nonspecific ST abnormality Abnormal ECG No previous ECGs available Confirmed by Theotis Burrow [phone]) on 07/18/2019 7:32:01 PM   Radiology US Abdomen Complete  Result Date: 07/18/2019 CLINICAL DATA:  Epigastric and right upper quadrant pain EXAM: ABDOMEN ULTRASOUND COMPLETE COMPARISON:  None. FINDINGS: Gallbladder: Gallbladder is mildly distended. 9 mm mobile gallstone noted. No wall thickening or sonographic Murphy's sign. Small amount of sludge within the gallbladder. Common bile duct: Diameter: Upper limits normal in caliber at 7 mm. Liver: Increased echotexture compatible with fatty infiltration. No focal abnormality or biliary ductal dilatation. Portal vein is patent on color Doppler imaging with normal direction of blood flow towards the liver. IVC: No abnormality visualized. Pancreas: Visualized portion unremarkable. Spleen: Size and appearance within normal limits. Right Kidney: Length: 9.5 cm. Echogenicity within normal limits. No mass or hydronephrosis visualized. Left Kidney: Length: 9.5 cm. Echogenicity within normal limits. No mass or hydronephrosis visualized. Abdominal aorta: No aneurysm visualized. Other findings: None. IMPRESSION: Cholelithiasis and sludge within the gallbladder. No evidence of acute cholecystitis. Fatty infiltration of the liver. Electronically Signed   By: Rolm Baptise M.D.   On: 07/18/2019 21:49    Procedures Procedures (including critical care time)  Medications Ordered in ED Medications  sodium chloride flush (NS) 0.9 % injection 3 mL (3 mLs Intravenous Not Given 07/18/19 1957)  ondansetron  (ZOFRAN) injection 4 mg (4 mg Intravenous Given 07/18/19 2040)  morphine 4 MG/ML injection 4 mg (4 mg Intravenous Given 07/18/19 2040)  sodium chloride 0.9 % bolus 1,000 mL (0 mLs Intravenous Stopped 07/18/19 2206)  HYDROmorphone (DILAUDID) injection 0.5 mg (0.5 mg Intravenous Given 07/18/19 2111)     Initial Impression / Assessment and Plan / ED Course  I have reviewed the triage vital signs and the nursing notes.  Pertinent labs & imaging results that were available during my care of the patient were reviewed by me and considered in my medical decision making (see chart for details).  73 year old female with past medical history of anxiety, depression, encephalopathy, atrial fibrillation on Eliquis, hypothyroidism,  gallstones without obstruction (2014) who presents with epigastric pain.    Patient describes colicky pain worsened by eating.  Points to epigastric pain on physical exam, positive Murphy sign. Differential included cholelithiasis, cholecystitis, choledocholithiasis, and pancreatitis.  Labs remarkable  for elevated LFT's, lipase only mildly elevated.  Personally reviewed ultrasound which shows cholelithiasis.  Labs concerning for obstruction.    Consulted [facility] - talked to Dr.Nandigam who recommended MRCP and will evaluate the patient in the morning. Discussed case with Dr.Kakrakandy who agreed to admit patient.    Final Clinical Impressions(s) / ED Diagnoses   Final diagnoses:  None    ED Discharge Orders    None       Madalyn Rob, MD 07/18/19 2306    Rex Kras Wenda Overland, MD 07/20/19 2050

## 2019-07-18 NOTE — ED Notes (Signed)
Pt aware we need urine sample.  

## 2019-07-18 NOTE — ED Notes (Signed)
Patient transported to [facility] 

## 2019-07-19 ENCOUNTER — Encounter (HOSPITAL_COMMUNITY): Payer: Self-pay | Admitting: Internal Medicine

## 2019-07-19 ENCOUNTER — Inpatient Hospital Stay (HOSPITAL_COMMUNITY): Payer: PPO

## 2019-07-19 DIAGNOSIS — K8020 Calculus of gallbladder without cholecystitis without obstruction: Secondary | ICD-10-CM | POA: Diagnosis present

## 2019-07-19 DIAGNOSIS — K8021 Calculus of gallbladder without cholecystitis with obstruction: Secondary | ICD-10-CM

## 2019-07-19 DIAGNOSIS — R1013 Epigastric pain: Secondary | ICD-10-CM

## 2019-07-19 DIAGNOSIS — R945 Abnormal results of liver function studies: Secondary | ICD-10-CM | POA: Diagnosis present

## 2019-07-19 DIAGNOSIS — I482 Chronic atrial fibrillation, unspecified: Secondary | ICD-10-CM | POA: Diagnosis present

## 2019-07-19 DIAGNOSIS — R109 Unspecified abdominal pain: Secondary | ICD-10-CM | POA: Diagnosis present

## 2019-07-19 DIAGNOSIS — R7989 Other specified abnormal findings of blood chemistry: Secondary | ICD-10-CM | POA: Diagnosis present

## 2019-07-19 LAB — BASIC METABOLIC PANEL
Anion gap: 8 (ref 5–15)
BUN: 11 mg/dL (ref 8–23)
CO2: 22 mmol/L (ref 22–32)
Calcium: 8.6 mg/dL — ABNORMAL LOW (ref 8.9–10.3)
Chloride: 108 mmol/L (ref 98–111)
Creatinine, Ser: 1 mg/dL (ref 0.44–1.00)
Glucose, Bld: 131 mg/dL — ABNORMAL HIGH (ref 70–99)
Potassium: 3.6 mmol/L (ref 3.5–5.1)
Sodium: 138 mmol/L (ref 135–145)

## 2019-07-19 LAB — URINALYSIS, ROUTINE W REFLEX MICROSCOPIC
Bilirubin Urine: NEGATIVE
Glucose, UA: NEGATIVE mg/dL
Hgb urine dipstick: NEGATIVE
Ketones, ur: 20 mg/dL — AB
Nitrite: NEGATIVE
Protein, ur: 30 mg/dL — AB
Specific Gravity, Urine: 1.019 (ref 1.005–1.030)
WBC, UA: >50 WBC/hpf — ABNORMAL HIGH (ref 0–5)
pH: 5 (ref 5.0–8.0)

## 2019-07-19 LAB — APTT: aPTT: 79 seconds — ABNORMAL HIGH (ref 24–36)

## 2019-07-19 LAB — COMPREHENSIVE METABOLIC PANEL
ALT: 375 U/L — ABNORMAL HIGH (ref 0–44)
AST: 190 U/L — ABNORMAL HIGH (ref 15–41)
Albumin: 3.2 g/dL — ABNORMAL LOW (ref 3.5–5.0)
Alkaline Phosphatase: 187 U/L — ABNORMAL HIGH (ref 38–126)
Anion gap: 11 (ref 5–15)
BUN: 10 mg/dL (ref 8–23)
CO2: 22 mmol/L (ref 22–32)
Calcium: 9 mg/dL (ref 8.9–10.3)
Chloride: 108 mmol/L (ref 98–111)
Creatinine, Ser: 0.91 mg/dL (ref 0.44–1.00)
GFR calc Af Amer: >60 mL/min (ref >60)
GFR calc non Af Amer: >60 mL/min (ref >60)
Glucose, Bld: 96 mg/dL (ref 70–99)
Potassium: 3.4 mmol/L — ABNORMAL LOW (ref 3.5–5.1)
Sodium: 141 mmol/L (ref 135–145)
Total Bilirubin: 2.7 mg/dL — ABNORMAL HIGH (ref 0.3–1.2)
Total Protein: 6 g/dL — ABNORMAL LOW (ref 6.5–8.1)

## 2019-07-19 LAB — HEPATIC FUNCTION PANEL
ALT: 451 U/L — ABNORMAL HIGH (ref 0–44)
AST: 317 U/L — ABNORMAL HIGH (ref 15–41)
Albumin: 3 g/dL — ABNORMAL LOW (ref 3.5–5.0)
Alkaline Phosphatase: 183 U/L — ABNORMAL HIGH (ref 38–126)
Bilirubin, Direct: 1.4 mg/dL — ABNORMAL HIGH (ref 0.0–0.2)
Indirect Bilirubin: 1.5 mg/dL — ABNORMAL HIGH (ref 0.3–0.9)
Total Bilirubin: 2.9 mg/dL — ABNORMAL HIGH (ref 0.3–1.2)
Total Protein: 5.2 g/dL — ABNORMAL LOW (ref 6.5–8.1)

## 2019-07-19 LAB — TROPONIN I (HIGH SENSITIVITY): Troponin I (High Sensitivity): 25 ng/L — ABNORMAL HIGH (ref <18)

## 2019-07-19 LAB — HEPARIN LEVEL (UNFRACTIONATED): Heparin Unfractionated: 1.1 IU/mL — ABNORMAL HIGH (ref 0.30–0.70)

## 2019-07-19 LAB — CBC WITH DIFFERENTIAL/PLATELET
Abs Immature Granulocytes: 0.1 10*3/uL — ABNORMAL HIGH (ref 0.00–0.07)
Basophils Absolute: 0 10*3/uL (ref 0.0–0.1)
Basophils Relative: 0 %
Eosinophils Absolute: 0 10*3/uL (ref 0.0–0.5)
Eosinophils Relative: 0 %
HCT: 37.5 % (ref 36.0–46.0)
Hemoglobin: 12.3 g/dL (ref 12.0–15.0)
Immature Granulocytes: 1 %
Lymphocytes Relative: 7 %
Lymphs Abs: 1 10*3/uL (ref 0.7–4.0)
MCH: 29.4 pg (ref 26.0–34.0)
MCHC: 32.8 g/dL (ref 30.0–36.0)
MCV: 89.5 fL (ref 80.0–100.0)
Monocytes Absolute: 0.8 10*3/uL (ref 0.1–1.0)
Monocytes Relative: 6 %
Neutro Abs: 12.4 10*3/uL — ABNORMAL HIGH (ref 1.7–7.7)
Neutrophils Relative %: 86 %
Platelets: 170 10*3/uL (ref 150–400)
RBC: 4.19 MIL/uL (ref 3.87–5.11)
RDW: 14.4 % (ref 11.5–15.5)
WBC: 14.3 10*3/uL — ABNORMAL HIGH (ref 4.0–10.5)
nRBC: 0 % (ref 0.0–0.2)

## 2019-07-19 LAB — SARS CORONAVIRUS 2 (TAT 6-24 HRS): SARS Coronavirus 2: NEGATIVE

## 2019-07-19 LAB — GLUCOSE, CAPILLARY: Glucose-Capillary: 84 mg/dL (ref 70–99)

## 2019-07-19 LAB — CBG MONITORING, ED: Glucose-Capillary: 105 mg/dL — ABNORMAL HIGH (ref 70–99)

## 2019-07-19 MED ORDER — PIPERACILLIN-TAZOBACTAM 3.375 G IVPB
3.3750 g | Freq: Three times a day (TID) | INTRAVENOUS | Status: DC
  Administered 2019-07-19 – 2019-07-23 (×12): 3.375 g via INTRAVENOUS
  Filled 2019-07-19 (×15): qty 50

## 2019-07-19 MED ORDER — ACETAMINOPHEN 325 MG PO TABS
650.0000 mg | ORAL_TABLET | Freq: Four times a day (QID) | ORAL | Status: DC | PRN
  Administered 2019-07-19 – 2019-07-21 (×2): 650 mg via ORAL
  Filled 2019-07-19 (×4): qty 2

## 2019-07-19 MED ORDER — MORPHINE SULFATE (PF) 2 MG/ML IV SOLN
1.0000 mg | INTRAVENOUS | Status: DC | PRN
  Administered 2019-07-19 – 2019-07-20 (×6): 1 mg via INTRAVENOUS
  Filled 2019-07-19 (×7): qty 1

## 2019-07-19 MED ORDER — ACETAMINOPHEN 650 MG RE SUPP: 650.0000 mg | Freq: Four times a day (QID) | RECTAL | Status: DC | PRN

## 2019-07-19 MED ORDER — HEPARIN (PORCINE) 25000 UT/250ML-% IV SOLN
1000.0000 [IU]/h | INTRAVENOUS | Status: AC
  Administered 2019-07-19 – 2019-07-20 (×2): 1000 [IU]/h via INTRAVENOUS
  Filled 2019-07-19 (×2): qty 250

## 2019-07-19 MED ORDER — NAPROXEN 250 MG PO TABS
500.0000 mg | ORAL_TABLET | Freq: Two times a day (BID) | ORAL | Status: DC
  Administered 2019-07-20 – 2019-07-23 (×4): 500 mg via ORAL
  Filled 2019-07-19 (×4): qty 2

## 2019-07-19 MED ORDER — ALPRAZOLAM 0.5 MG PO TABS
0.5000 mg | ORAL_TABLET | Freq: Once | ORAL | Status: AC
  Administered 2019-07-19: 0.5 mg via ORAL
  Filled 2019-07-19: qty 1

## 2019-07-19 MED ORDER — PIPERACILLIN-TAZOBACTAM 3.375 G IVPB 30 MIN
3.3750 g | INTRAVENOUS | Status: AC
  Administered 2019-07-19: 3.375 g via INTRAVENOUS
  Filled 2019-07-19: qty 50

## 2019-07-19 MED ORDER — ONDANSETRON HCL 4 MG PO TABS
4.0000 mg | ORAL_TABLET | Freq: Four times a day (QID) | ORAL | Status: DC | PRN
  Administered 2019-07-23: 4 mg via ORAL
  Filled 2019-07-19: qty 1

## 2019-07-19 MED ORDER — ONDANSETRON HCL 4 MG/2ML IJ SOLN
4.0000 mg | Freq: Four times a day (QID) | INTRAMUSCULAR | Status: DC | PRN
  Administered 2019-07-19 – 2019-07-20 (×2): 4 mg via INTRAVENOUS
  Filled 2019-07-19 (×2): qty 2

## 2019-07-19 MED ORDER — GADOBUTROL 1 MMOL/ML IV SOLN
7.0000 mL | Freq: Once | INTRAVENOUS | Status: AC | PRN
  Administered 2019-07-19: 7 mL via INTRAVENOUS

## 2019-07-19 NOTE — ED Notes (Signed)
Report given to Antionette Char on 5W

## 2019-07-19 NOTE — H&P (Signed)
History and Physical    Carly Rhodes [id number] DOB: 04-Apr-1946 DOA: 07/18/2019  PCP: Aura Dials, MD  Patient coming from: Home.  Chief Complaint: Abdominal pain.  HPI: Carly Rhodes is a 73 y.o. 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  Patient states she has been having abdominal pain off and on for last few months.  Over the last couple of days it has become more intense.  2 days ago patient was having severe pain lasted for 2 hours continuously and had called EMS and at the time did not want to come to the ER.  Since the pain came back again at this time decided to come back to the ER.  On the way to the ER patient had one episode of vomiting.  Pain is mostly epigastric radiating back.  Denies any chest pain or shortness of breath fever chills or diarrhea.  Takes apixaban for last dose was yesterday morning.  ED Course: In the ER on exam patient has mild epigastric tenderness.  Blood pressure was in the low normal.  Labs show alkaline phosphatase 213 lipase 59 AST 476 ALT 542 total bilirubin 2.9 WBC 12.6 platelets 205 COVID-19 test was negative.  Sonogram of the abdomen done shows cholelithiasis with no evidence of cholecystitis.  EKG shows sinus tachycardia.  ER physician discussed with on-call above gastroenterologist Dr. Silverio Decamp who advised to get MRCP and will be seeing patient in consult.  Since patient blood pressure is in the low normal with elevated LFTs empiric antibiotics were started by me.  Review of Systems: As per HPI, rest all negative.   Past Medical History:  Diagnosis Date  . Dysrhythmia     History reviewed. No pertinent surgical history.   reports that she has never smoked. She has never used smokeless tobacco. She reports that she does not drink alcohol. No history on file for drug.  No Known Allergies  Family History  Family history  unknown: Yes    Prior to Admission medications   Medication Sig Start Date End Date Taking? Authorizing Provider  acetaminophen-codeine (TYLENOL #3) 300-30 MG tablet Take 2 tablets by mouth at bedtime. 06/22/19  Yes [provider]  ALPRAZolam Duanne Moron) 0.5 MG tablet Take 0.5 mg by mouth at bedtime.   Yes [provider]  baclofen (LIORESAL) 10 MG tablet Take 20 mg by mouth at bedtime. 07/05/19  Yes [provider]  cyanocobalamin (,VITAMIN B-12,) 1000 MCG/ML injection Inject 1 mL into the muscle every 30 (thirty) days. 07/05/19  Yes [provider]  ELIQUIS 5 MG TABS tablet Take 5 mg by mouth 2 (two) times daily. 07/10/19  Yes [provider]  ezetimibe (ZETIA) 10 MG tablet Take 10 mg by mouth daily.   Yes [provider]  traZODone (DESYREL) 50 MG tablet Take 50 mg by mouth at bedtime.   Yes [provider]    Physical Exam: Constitutional: Moderately built and nourished. Vitals:   07/18/19 2045 07/18/19 2202 07/18/19 2203 07/18/19 2206  BP: 134/84 91/60  91/60  Pulse:   (!) 108 (!) 110  Resp:    18  Temp:      TempSrc:      SpO2:   94% 96%   Eyes: Anicteric no pallor. ENMT: No discharge from the ears eyes nose or mouth. Neck: No mass felt.  No neck rigidity. Respiratory: No rhonchi or crepitations. Cardiovascular: S1-S2 heard.  Abdomen: Mild epigastric tenderness no guarding or rigidity. Musculoskeletal: No edema. Skin: No rash. Neurologic: Alert awake oriented time place and person.  Moves all extremities. Psychiatric: Appears normal.   Labs on Admission: I have personally reviewed following labs and imaging studies  CBC: Recent Labs  Lab 07/18/19 1801  WBC 12.6*  HGB 14.4  HCT 43.8  MCV 89.4  PLT 99991111   Basic Metabolic Panel: Recent Labs  Lab 07/18/19 1801  NA 140  K 3.8  CL 105  CO2 22  GLUCOSE 117*  BUN 10  CREATININE 0.87  CALCIUM 9.6   GFR: CrCl cannot be calculated (Unknown ideal  weight.). Liver Function Tests: Recent Labs  Lab 07/18/19 1801  AST 476*  ALT 542*  ALKPHOS 213*  BILITOT 2.9*  PROT 7.1  ALBUMIN 4.3   Recent Labs  Lab 07/18/19 1801  LIPASE 59*   No results for input(s): AMMONIA in the last 168 hours. Coagulation Profile: No results for input(s): INR, PROTIME in the last 168 hours. Cardiac Enzymes: No results for input(s): CKTOTAL, CKMB, CKMBINDEX, TROPONINI in the last 168 hours. BNP (last 3 results) No results for input(s): PROBNP in the last 8760 hours. HbA1C: No results for input(s): HGBA1C in the last 72 hours. CBG: No results for input(s): GLUCAP in the last 168 hours. Lipid Profile: No results for input(s): CHOL, HDL, LDLCALC, TRIG, CHOLHDL, LDLDIRECT in the last 72 hours. Thyroid Function Tests: No results for input(s): TSH, T4TOTAL, FREET4, T3FREE, THYROIDAB in the last 72 hours. Anemia Panel: No results for input(s): VITAMINB12, FOLATE, FERRITIN, TIBC, IRON, RETICCTPCT in the last 72 hours. Urine analysis: No results found for: COLORURINE, APPEARANCEUR, LABSPEC, PHURINE, GLUCOSEU, HGBUR, BILIRUBINUR, KETONESUR, PROTEINUR, UROBILINOGEN, NITRITE, LEUKOCYTESUR Sepsis Labs: @LABRCNTIP (procalcitonin:4,lacticidven:4) )No results found for this or any previous visit (from the past 240 hour(s)).   Radiological Exams on Admission: US Abdomen Complete  Result Date: 07/18/2019 CLINICAL DATA:  Epigastric and right upper quadrant pain EXAM: ABDOMEN ULTRASOUND COMPLETE COMPARISON:  None. FINDINGS: Gallbladder: Gallbladder is mildly distended. 9 mm mobile gallstone noted. No wall thickening or sonographic Murphy's sign. Small amount of sludge within the gallbladder. Common bile duct: Diameter: Upper limits normal in caliber at 7 mm. Liver: Increased echotexture compatible with fatty infiltration. No focal abnormality or biliary ductal dilatation. Portal vein is patent on color Doppler imaging with normal direction of blood flow towards the  liver. IVC: No abnormality visualized. Pancreas: Visualized portion unremarkable. Spleen: Size and appearance within normal limits. Right Kidney: Length: 9.5 cm. Echogenicity within normal limits. No mass or hydronephrosis visualized. Left Kidney: Length: 9.5 cm. Echogenicity within normal limits. No mass or hydronephrosis visualized. Abdominal aorta: No aneurysm visualized. Other findings: None. IMPRESSION: Cholelithiasis and sludge within the gallbladder. No evidence of acute cholecystitis. Fatty infiltration of the liver. Electronically Signed   By: Rolm Baptise M.D.   On: 07/18/2019 21:49    EKG: Independently reviewed.  Sinus tachycardia.  Assessment/Plan Principal Problem:   Calculus of gallbladder with biliary obstruction but without cholecystitis Active Problems:   Atrial fibrillation, chronic (HCC)   LFTs abnormal   Abdominal pain   Gallstones    1. Gallstone is elevated left is concerning for CBD stone -Dr. Silverio Decamp gastroenterology was notified.  Requested to get MRCP which has been ordered.  Since patient with patient in the low more normal with elevated LFTs with epigastric pain will keep patient n.p.o. antibiotics concerning for developing cholangitis the patient is afebrile at this time.  Follow LFTs.  Follow gastroenterology  recommendation.  Eventually patient will need cholecystectomy. 2. History of paroxysmal atrial fibrillation on Eliquis.  Presently keeping patient on heparin.  Not on any rate limiting medications. 3. History of encephalitis and chronic neuropathy takes baclofen.  Presently n.p.o.  Since patient has possible obstructing stone in the CBD with elevated LFTs will need more than 2 midnight stay in inpatient status.   DVT prophylaxis: Heparin infusion. Code Status: Full code. Family Communication: Discussed with patient. Disposition Plan: Home. Consults called: Copywriter, advertising. Admission status: Inpatient.   Rise Patience MD [facility]  Pager [phone].  If 7PM-7AM, please contact night-coverage [email or web address] Password [email or web address]  07/19/2019, 12:05 AM

## 2019-07-19 NOTE — Progress Notes (Signed)
ANTICOAGULATION/ANTIBIOTIC CONSULT NOTE - Initial Consult  Pharmacy Consult for Heparin and Zosyn Indication: afib and intra-abd infection  No Known Allergies  Patient Measurements: Height: 5\' 5"  (165.1 cm) Weight: 155 lb (70.3 kg) IBW/kg (Calculated) : 57 Heparin Dosing Weight: 70 kg  Vital Signs: Temp: 98.9 F (37.2 C) (12/01 1953) Temp Source: Oral (12/01 1953) BP: 91/60 (12/01 2206) Pulse Rate: 110 (12/01 2206)  Labs: Recent Labs    07/18/19 1801  HGB 14.4  HCT 43.8  PLT 205  CREATININE 0.87    Estimated Creatinine Clearance: 55.8 mL/min (by C-G formula based on SCr of 0.87 mg/dL).   Medical History: Past Medical History:  Diagnosis Date  . Dysrhythmia     Medications:  See electronic med rec  Assessment: 73 y.o. F presents with abd pain.  Anticoag: Apixaban PTA for afib. Last dose 12/1 0730. Holding in anticipation of procedures and starting heparin. Apixaban likely affecting heparin levels so will utilize PTT for monitoring until levels correlating. ID: intra-abd infection  12/2 Zosyn>>  12/1 SARS coronavirus 2:  Goal of Therapy:  Heparin level 0.3-0.7 units/ml, PTT 66-102 sec Monitor platelets by anticoagulation protocol: Yes   Plan:  Zosyn 3.375gm IV q8h - each dose over 4 hours Will f/u renal function, micro data, and pt's clinical condition Heparin gtt at 1000 units/hr Will f/u PTT in 8 hours Daily heparin level, PTT, and CBC  Sherlon Handing, PharmD, BCPS Please see amion for complete clinical pharmacist phone list 07/19/2019,12:13 AM

## 2019-07-19 NOTE — Progress Notes (Addendum)
Agree with plan of care for end-of-life partner Dr. Hal Hope   73 year old white female, [just lost husband ~ 3-4 weeks ago was on hospice] gallbladder stone with probable ascending cholangitis Is currently on heparin GTT because of A. fib, pain control with morphine, nausea control Zofran, LR 100/h LFTs look improved from prior, white count slightly up to 14-continuing Zosyn keep patient n.p.o. Await further input gastroenterology--diet as per them  BP 106/74   Pulse 79   Temp 98.9 F (37.2 C) (Oral)   Resp 15   Ht 5\' 5"  (1.651 m)   Wt 70.3 kg   SpO2 97%   BMI 25.79 kg/m  Has some pain burt bearable Seems a little confused about poc and quite anxious eomi ncat abd soft nt nd no rebound   P Pain control naprocen 550 [lft's up--hold Acteominophen] Rest as per GI, including resumption Xarelto  Verneita Griffes, MD [facility] 9:48 AM

## 2019-07-19 NOTE — Consult Note (Signed)
Referring Provider:  [facility]         Primary Care Physician:  Aura Dials, MD Primary Gastroenterologist:  Previously            Reason for Consultation:  abdominal pain, cholelithiasis             ASSESSMENT /  PLAN    62. 73 yo female with cholecystitis / choledocholithiasis presenting with epigastric pain / abnormal liver tests / leukocytosis with left shift.  -continue Zosyn -Will need ERCP with stone extraction probable sphincterotomy. Will stop IV heparin 4-6 hours prior to procedure. The risks and benefits of ERCP with stone extraction / possible sphincterotomy were discussed with the patient and she agrees to proceed.    2. AFib, on Eliquis. Last dose was yesterday 07/18/19. Currently getting IV heparin   HPI:     Note this pt has 2 different medical records numbers.The other number is [id number].  Carly Rhodes is a 73 y.o. female with a pmh significant for depression/anxiety, A. fib on Eliquis, hypothyroidism, cholelithiasis and osteoarthritis.Grandmother had colon cancer late in life.  Brother with "stomach" cancer  Patient presented to ED yesterday with one day history of constant epigastric pain radiating through to her back. Had milder episodes of pain about a month ago. Pain often worse with eating.  She had associated vomiting yesterday. Denies fever. No bowel changes. No other GI complaints.   ED studies:   lipase 59, AST 476, ALT 542, TB 2.9. WBC 12.6 ( now 14.3), left shift present,  hgb 14.4 ( now 12.3)  MRCP today: cholelithiasis and choledocholithiasis with mild extrahepatic biliary dilation. No evidence for cholecystitis.    Previous Endoscopies:  Colonoscopy n 2007 and 2013 for family history of colon cancer.  Dr. Harrell Lark in [facility] found no polyps in 2007.  Dr. Olevia Perches found no polyps, normal study in 2013.    Past Medical History:  Diagnosis Date   Anxiety    Atrial fibrillation [facility])    Dysrhythmia     History  reviewed. No pertinent surgical history.  Prior to Admission medications   Medication Sig Start Date End Date Taking? Authorizing Provider  acetaminophen-codeine (TYLENOL #3) 300-30 MG tablet Take 2 tablets by mouth at bedtime. 06/22/19  Yes [provider]  ALPRAZolam Duanne Moron) 0.5 MG tablet Take 0.5 mg by mouth at bedtime.   Yes [provider]  baclofen (LIORESAL) 10 MG tablet Take 20 mg by mouth at bedtime. 07/05/19  Yes [provider]  cyanocobalamin (,VITAMIN B-12,) 1000 MCG/ML injection Inject 1 mL into the muscle every 30 (thirty) days. 07/05/19  Yes [provider]  ELIQUIS 5 MG TABS tablet Take 5 mg by mouth 2 (two) times daily. 07/10/19  Yes [provider]  ezetimibe (ZETIA) 10 MG tablet Take 10 mg by mouth daily.   Yes [provider]  traZODone (DESYREL) 50 MG tablet Take 50 mg by mouth at bedtime.   Yes [provider]    Current Facility-Administered Medications  Medication Dose Route Frequency Provider Last Rate Last Dose   acetaminophen (TYLENOL) tablet 650 mg  650 mg Oral Q6H PRN Rise Patience, MD       Or   acetaminophen (TYLENOL) suppository 650 mg  650 mg Rectal Q6H PRN Rise Patience, MD       heparin ADULT infusion 100 units/mL (25000 units/268mL sodium chloride 0.45%)  1,000 Units/hr Intravenous Continuous Franky Macho, RPH 10 mL/hr at 07/19/19 0128 1,000  Units/hr at 07/19/19 0128   lactated ringers infusion   Intravenous Continuous Rise Patience, MD 100 mL/hr at 07/19/19 0143     morphine 2 MG/ML injection 1 mg  1 mg Intravenous Q2H PRN Rise Patience, MD   1 mg at 07/19/19 0050   ondansetron (ZOFRAN) tablet 4 mg  4 mg Oral Q6H PRN Rise Patience, MD       Or   ondansetron [facility]) injection 4 mg  4 mg Intravenous Q6H PRN Rise Patience, MD       piperacillin-tazobactam (ZOSYN) IVPB 3.375 g  3.375 g Intravenous Q8H Franky Macho, RPH 12.5 mL/hr at 07/19/19  0807 3.375 g at 07/19/19 [id number]   sodium chloride flush (NS) 0.9 % injection 3 mL  3 mL Intravenous Once Little, Wenda Overland, MD       Current Outpatient Medications  Medication Sig Dispense Refill   acetaminophen-codeine (TYLENOL #3) 300-30 MG tablet Take 2 tablets by mouth at bedtime.     ALPRAZolam (XANAX) 0.5 MG tablet Take 0.5 mg by mouth at bedtime.     baclofen (LIORESAL) 10 MG tablet Take 20 mg by mouth at bedtime.     cyanocobalamin (,VITAMIN B-12,) 1000 MCG/ML injection Inject 1 mL into the muscle every 30 (thirty) days.     ELIQUIS 5 MG TABS tablet Take 5 mg by mouth 2 (two) times daily.     ezetimibe (ZETIA) 10 MG tablet Take 10 mg by mouth daily.     traZODone (DESYREL) 50 MG tablet Take 50 mg by mouth at bedtime.      Allergies as of 07/18/2019   (No Known Allergies)    FMH: stomach cancer in brother. Grandmother had colon cancer  Social History   Socioeconomic History   Marital status: Single    Spouse name: Not on file   Number of children: Not on file   Years of education: Not on file   Highest education level: Not on file  Occupational History   Not on file  Social Needs   Financial resource strain: Not on file   Food insecurity    Worry: Not on file    Inability: Not on file   Transportation needs    Medical: Not on file    Non-medical: Not on file  Tobacco Use   Smoking status: Never Smoker   Smokeless tobacco: Never Used  Substance and Sexual Activity   Alcohol use: Never    Frequency: Never   Drug use: Not on file   Sexual activity: Not on file  Lifestyle   Physical activity    Days per week: Not on file    Minutes per session: Not on file   Stress: Not on file  Relationships   Social connections    Talks on phone: Not on file    Gets together: Not on file    Attends religious service: Not on file    Active member of club or organization: Not on file    Attends meetings of clubs or organizations: Not on file     Relationship status: Not on file   Intimate partner violence    Fear of current or ex partner: Not on file    Emotionally abused: Not on file    Physically abused: Not on file    Forced sexual activity: Not on file  Other Topics Concern   Not on file  Social History Narrative   Not on file    Review of  Systems: All systems reviewed and negative except where noted in HPI.  Physical Exam: Vital signs in last 24 hours: Temp:  [98.4 F (36.9 C)-98.9 F (37.2 C)] 98.9 F (37.2 C) (12/01 1953) Pulse Rate:  [78-118] 79 (12/02 0800) Resp:  [14-24] 16 (12/02 0800) BP: (90-148)/(59-94) 103/62 (12/02 0800) SpO2:  [94 %-99 %] 94 % (12/02 0800) Weight:  [70.3 kg] 70.3 kg (12/02 0000)   General:   Alert, well-developed,  female in NAD Psych:  Pleasant, cooperative. Normal mood and affect. Eyes:  Pupils equal, sclera clear, no icterus.   Conjunctiva pink. Ears:  Normal auditory acuity. Nose:  No deformity, discharge,  or lesions. Neck:  Supple; no masses Lungs:  Clear throughout to auscultation.   No wheezes, crackles, or rhonchi.  Heart:  Regular rate and rhythm; no murmurs, no lower extremity edema Abdomen:  Soft, non-distended, nontender, BS active, no palp mass   Rectal:  Deferred  Msk:  Symmetrical without gross deformities. . Neurologic:  Alert and  oriented x4;  grossly normal neurologically. Skin:  Intact without significant lesions or rashes.   Intake/Output from previous day: 12/01 0701 - 12/02 0700 In: 2100 [IV Piggyback:2100] Out: -  Intake/Output this shift: No intake/output data recorded.  Lab Results: Recent Labs    07/18/19 1801 07/19/19 0601  WBC 12.6* 14.3*  HGB 14.4 12.3  HCT 43.8 37.5  PLT 205 170   BMET Recent Labs    07/18/19 1801 07/19/19 0601  NA 140 138  K 3.8 3.6  CL 105 108  CO2 22 22  GLUCOSE 117* 131*  BUN 10 11  CREATININE 0.87 1.00  CALCIUM 9.6 8.6*   LFT Recent Labs    07/19/19 0601  PROT 5.2*  ALBUMIN 3.0*  AST 317*    ALT 451*  ALKPHOS 183*  BILITOT 2.9*  BILIDIR 1.4*  IBILI 1.5*     . CBC Latest Ref Rng & Units 07/19/2019 07/18/2019  WBC 4.0 - 10.5 K/uL 14.3(H) 12.6(H)  Hemoglobin 12.0 - 15.0 g/dL 12.3 14.4  Hematocrit 36.0 - 46.0 % 37.5 43.8  Platelets 150 - 400 K/uL 170 205    . CMP Latest Ref Rng & Units 07/19/2019 07/18/2019  Glucose 70 - 99 mg/dL 131(H) 117(H)  BUN 8 - 23 mg/dL 11 10  Creatinine 0.44 - 1.00 mg/dL 1.00 0.87  Sodium 135 - 145 mmol/L 138 140  Potassium 3.5 - 5.1 mmol/L 3.6 3.8  Chloride 98 - 111 mmol/L 108 105  CO2 22 - 32 mmol/L 22 22  Calcium 8.9 - 10.3 mg/dL 8.6(L) 9.6  Total Protein 6.5 - 8.1 g/dL 5.2(L) 7.1  Total Bilirubin 0.3 - 1.2 mg/dL 2.9(H) 2.9(H)  Alkaline Phos 38 - 126 U/L 183(H) 213(H)  AST 15 - 41 U/L 317(H) 476(H)  ALT 0 - 44 U/L 451(H) 542(H)   Studies/Results: US Abdomen Complete  Result Date: 07/18/2019 CLINICAL DATA:  Epigastric and right upper quadrant pain EXAM: ABDOMEN ULTRASOUND COMPLETE COMPARISON:  None. FINDINGS: Gallbladder: Gallbladder is mildly distended. 9 mm mobile gallstone noted. No wall thickening or sonographic Murphy's sign. Small amount of sludge within the gallbladder. Common bile duct: Diameter: Upper limits normal in caliber at 7 mm. Liver: Increased echotexture compatible with fatty infiltration. No focal abnormality or biliary ductal dilatation. Portal vein is patent on color Doppler imaging with normal direction of blood flow towards the liver. IVC: No abnormality visualized. Pancreas: Visualized portion unremarkable. Spleen: Size and appearance within normal limits. Right Kidney: Length: 9.5 cm. Echogenicity within  normal limits. No mass or hydronephrosis visualized. Left Kidney: Length: 9.5 cm. Echogenicity within normal limits. No mass or hydronephrosis visualized. Abdominal aorta: No aneurysm visualized. Other findings: None. IMPRESSION: Cholelithiasis and sludge within the gallbladder. No evidence of acute cholecystitis. Fatty  infiltration of the liver. Electronically Signed   By: Rolm Baptise M.D.   On: 07/18/2019 21:49   Mr 3d Recon At Scanner  Result Date: 07/19/2019 CLINICAL DATA:  Abnormal liver function studies. Cholelithiasis. EXAM: MRI ABDOMEN WITHOUT AND WITH CONTRAST (INCLUDING MRCP) TECHNIQUE: Multiplanar multisequence MR imaging of the abdomen was performed both before and after the administration of intravenous contrast. Heavily T2-weighted images of the biliary and pancreatic ducts were obtained, and three-dimensional MRCP images were rendered by post processing. CONTRAST:  44mL GADAVIST GADOBUTROL 1 MMOL/ML IV SOLN COMPARISON:  Abdominal ultrasound 07/18/2019. FINDINGS: Lower chest: Minimal atelectasis at both lung bases. The visualized lower chest otherwise appears unremarkable. Hepatobiliary: No significant hepatic steatosis or focal abnormality. There are several small dependent gallstones. The common hepatic duct measures up to 11 mm in diameter. There are several (at least 3) calculi in the distal common bile duct, measuring up to 7 mm in diameter. No significant intrahepatic biliary dilatation. Pancreas: Unremarkable. No pancreatic ductal dilatation or surrounding inflammatory changes. Spleen: Normal in size without focal abnormality. Adrenals/Urinary Tract: Both adrenal glands appear normal. There are tiny renal cysts. The kidneys and ureters otherwise appear normal. No hydronephrosis. Stomach/Bowel: No evidence of bowel wall thickening, distention or surrounding inflammatory change. Vascular/Lymphatic: There are no enlarged abdominal lymph nodes. No significant vascular findings are present. Other: No ascites. Musculoskeletal: No acute or significant osseous findings. Mild lumbar spondylosis. IMPRESSION: 1. Cholelithiasis and choledocholithiasis with mild extrahepatic biliary dilatation. No evidence of cholecystitis. 2. No other significant findings. Electronically Signed   By: Richardean Sale M.D.   On:  07/19/2019 08:43   Mr Abdomen Mrcp Moise Boring Contast  Result Date: 07/19/2019 CLINICAL DATA:  Abnormal liver function studies. Cholelithiasis. EXAM: MRI ABDOMEN WITHOUT AND WITH CONTRAST (INCLUDING MRCP) TECHNIQUE: Multiplanar multisequence MR imaging of the abdomen was performed both before and after the administration of intravenous contrast. Heavily T2-weighted images of the biliary and pancreatic ducts were obtained, and three-dimensional MRCP images were rendered by post processing. CONTRAST:  32mL GADAVIST GADOBUTROL 1 MMOL/ML IV SOLN COMPARISON:  Abdominal ultrasound 07/18/2019. FINDINGS: Lower chest: Minimal atelectasis at both lung bases. The visualized lower chest otherwise appears unremarkable. Hepatobiliary: No significant hepatic steatosis or focal abnormality. There are several small dependent gallstones. The common hepatic duct measures up to 11 mm in diameter. There are several (at least 3) calculi in the distal common bile duct, measuring up to 7 mm in diameter. No significant intrahepatic biliary dilatation. Pancreas: Unremarkable. No pancreatic ductal dilatation or surrounding inflammatory changes. Spleen: Normal in size without focal abnormality. Adrenals/Urinary Tract: Both adrenal glands appear normal. There are tiny renal cysts. The kidneys and ureters otherwise appear normal. No hydronephrosis. Stomach/Bowel: No evidence of bowel wall thickening, distention or surrounding inflammatory change. Vascular/Lymphatic: There are no enlarged abdominal lymph nodes. No significant vascular findings are present. Other: No ascites. Musculoskeletal: No acute or significant osseous findings. Mild lumbar spondylosis. IMPRESSION: 1. Cholelithiasis and choledocholithiasis with mild extrahepatic biliary dilatation. No evidence of cholecystitis. 2. No other significant findings. Electronically Signed   By: Richardean Sale M.D.   On: 07/19/2019 08:43    Principal Problem:   Calculus of gallbladder with biliary  obstruction but without cholecystitis Active Problems:   Atrial fibrillation,  chronic ([place])   LFTs abnormal   Abdominal pain   Gallstones    Tye Savoy, NP-C @  07/19/2019, 9:12 AM

## 2019-07-19 NOTE — ED Notes (Signed)
Patient transported to MRI 

## 2019-07-19 NOTE — ED Notes (Signed)
ED TO INPATIENT HANDOFF REPORT  ED Nurse Name and Phone #: Lorrin Goodell [phone]  S Name/Age/Gender Carly Rhodes 73 y.o. female Room/Bed: 027C/027C  Code Status   Code Status: DNR  Home/SNF/Other Home Patient oriented to: self, place, time and situation Is this baseline? Yes   Triage Complete: Triage complete  Chief Complaint abd pain  Triage Note Pt arrives to ED from home with complaints of epigastric abdominal pain x1 day that has not gone away. Patient states this has been happening on and off for weeks now. Patients states that pain radiates to her back. Patient states after she vomits pain usually goes away.    Allergies No Known Allergies  Level of Care/Admitting Diagnosis ED Disposition    ED Disposition Condition [facility]: [place] [[id number]]  Level of Care: Progressive [102]  Admit to Progressive based on following criteria: MULTISYSTEM THREATS such as stable sepsis, metabolic/electrolyte imbalance with or without encephalopathy that is responding to early treatment.  Covid Evaluation: Asymptomatic Screening Protocol (No Symptoms)  Diagnosis: Gallstones [id number]  Admitting Physician: Rise Patience [phone]  Attending Physician: Rise Patience [phone]  Estimated length of stay: past midnight tomorrow  Certification:: I certify this patient will need inpatient services for at least 2 midnights  PT Class (Do Not Modify): Inpatient [101]  PT Acc Code (Do Not Modify): Private [1]       B Medical/Surgery History Past Medical History:  Diagnosis Date  . Anxiety   . Atrial fibrillation ([place])   . Dysrhythmia    History reviewed. No pertinent surgical history.   A IV Location/Drains/Wounds Patient Lines/Drains/Airways Status   Active Line/Drains/Airways    Name:   Placement date:   Placement time:   Site:   Days:   Peripheral IV 07/18/19 Left Antecubital   07/18/19    1802    Antecubital   1   Peripheral IV  07/19/19 Right Wrist   07/19/19    0132    Wrist   less than 1   External Urinary Catheter   07/18/19    2239    -   1          Intake/Output Last 24 hours  Intake/Output Summary (Last 24 hours) at 07/19/2019 2017 Last data filed at 07/19/2019 1926 Gross per 24 hour  Intake 2300 ml  Output -  Net 2300 ml    Labs/Imaging Results for orders placed or performed during the hospital encounter of 07/18/19 (from the past 48 hour(s))  Lipase, blood     Status: Abnormal   Collection Time: 07/18/19  6:01 PM  Result Value Ref Range   Lipase 59 (H) 11 - 51 U/L    Comment: Performed at [facility], [address], [place], [place] [address]  Comprehensive metabolic panel     Status: Abnormal   Collection Time: 07/18/19  6:01 PM  Result Value Ref Range   Sodium 140 135 - 145 mmol/L   Potassium 3.8 3.5 - 5.1 mmol/L   Chloride 105 98 - 111 mmol/L   CO2 22 22 - 32 mmol/L   Glucose, Bld 117 (H) 70 - 99 mg/dL   BUN 10 8 - 23 mg/dL   Creatinine, Ser 0.87 0.44 - 1.00 mg/dL   Calcium 9.6 8.9 - 10.3 mg/dL   Total Protein 7.1 6.5 - 8.1 g/dL   Albumin 4.3 3.5 - 5.0 g/dL   AST 476 (H) 15 - 41 U/L  ALT 542 (H) 0 - 44 U/L   Alkaline Phosphatase 213 (H) 38 - 126 U/L   Total Bilirubin 2.9 (H) 0.3 - 1.2 mg/dL   GFR calc non Af Amer >60 >60 mL/min   GFR calc Af Amer >60 >60 mL/min   Anion gap 13 5 - 15    Comment: Performed at [facility] [address], [place], [place]  [address]  CBC     Status: Abnormal   Collection Time: 07/18/19  6:01 PM  Result Value Ref Range   WBC 12.6 (H) 4.0 - 10.5 K/uL   RBC 4.90 3.87 - 5.11 MIL/uL   Hemoglobin 14.4 12.0 - 15.0 g/dL   HCT 43.8 36.0 - 46.0 %   MCV 89.4 80.0 - 100.0 fL   MCH 29.4 26.0 - 34.0 pg   MCHC 32.9 30.0 - 36.0 g/dL   RDW 13.9 11.5 - 15.5 %   Platelets 205 150 - 400 K/uL   nRBC 0.0 0.0 - 0.2 %    Comment: Performed at [facility], [place] [address], [place], [place] [address]  SARS CORONAVIRUS 2 (TAT 6-24 HRS)  Nasopharyngeal Nasopharyngeal Swab     Status: None   Collection Time: 07/18/19 10:27 PM   Specimen: Nasopharyngeal Swab  Result Value Ref Range   SARS Coronavirus 2 NEGATIVE NEGATIVE    Comment: (NOTE) SARS-CoV-2 target nucleic acids are NOT DETECTED. The SARS-CoV-2 RNA is generally detectable in upper and lower respiratory specimens during the acute phase of infection. Negative results do not preclude SARS-CoV-2 infection, do not rule out co-infections with other pathogens, and should not be used as the sole basis for treatment or other patient management decisions. Negative results must be combined with clinical observations, patient history, and epidemiological information. The expected result is Negative. Fact Sheet for Patients: SugarRoll.be Fact Sheet for Healthcare Providers: https://www.woods-mathews.com/ This test is not yet approved or cleared by the [place] FDA and  has been authorized for detection and/or diagnosis of SARS-CoV-2 by FDA under an Emergency Use Authorization (EUA). This EUA will remain  in effect (meaning this test can be used) for the duration of the COVID-19 declaration under Section 56 4(b)(1) of the Act, 21 U.S.C. section 360bbb-3(b)(1), unless the authorization is terminated or revoked sooner. Performed at [facility], [place] [address] [place], [place] [address]   Heparin level (unfractionated)     Status: Abnormal   Collection Time: 07/19/19 12:10 AM  Result Value Ref Range   Heparin Unfractionated 1.10 (H) 0.30 - 0.70 IU/mL    Comment: (NOTE) If heparin results are below expected values, and patient dosage has  been confirmed, suggest follow up testing of antithrombin III levels. Performed at [facility], [place] [address] [place], [place] [address]   Urinalysis, Routine w reflex microscopic     Status: Abnormal   Collection Time: 07/19/19  3:52 AM  Result Value Ref Range   Color,  Urine AMBER (A) YELLOW    Comment: BIOCHEMICALS MAY BE AFFECTED BY COLOR   APPearance HAZY (A) CLEAR   Specific Gravity, Urine 1.019 1.005 - 1.030   pH 5.0 5.0 - 8.0   Glucose, UA NEGATIVE NEGATIVE mg/dL   Hgb urine dipstick NEGATIVE NEGATIVE   Bilirubin Urine NEGATIVE NEGATIVE   Ketones, ur 20 (A) NEGATIVE mg/dL   Protein, ur 30 (A) NEGATIVE mg/dL   Nitrite NEGATIVE NEGATIVE   Leukocytes,Ua LARGE (A) NEGATIVE   RBC / HPF 6-10 0 - 5  RBC/hpf   WBC, UA >50 (H) 0 - 5 WBC/hpf   Bacteria, UA FEW (A) NONE SEEN   Squamous Epithelial / LPF 0-5 0 - 5   Mucus PRESENT    Non Squamous Epithelial 0-5 (A) NONE SEEN    Comment: Performed at [facility], [place] [address], [place], [place] [id number]  Basic metabolic panel     Status: Abnormal   Collection Time: 07/19/19  6:01 AM  Result Value Ref Range   Sodium 138 135 - 145 mmol/L   Potassium 3.6 3.5 - 5.1 mmol/L   Chloride 108 98 - 111 mmol/L   CO2 22 22 - 32 mmol/L   Glucose, Bld 131 (H) 70 - 99 mg/dL   BUN 11 8 - 23 mg/dL   Creatinine, Ser 1.00 0.44 - 1.00 mg/dL   Calcium 8.6 (L) 8.9 - 10.3 mg/dL   GFR calc non Af Amer NOT CALCULATED >60 mL/min   GFR calc Af Amer NOT CALCULATED >60 mL/min   Anion gap 8 5 - 15    Comment: Performed at [facility] [address], [place], [place] [address]  Hepatic function panel     Status: Abnormal   Collection Time: 07/19/19  6:01 AM  Result Value Ref Range   Total Protein 5.2 (L) 6.5 - 8.1 g/dL   Albumin 3.0 (L) 3.5 - 5.0 g/dL   AST 317 (H) 15 - 41 U/L   ALT 451 (H) 0 - 44 U/L   Alkaline Phosphatase 183 (H) 38 - 126 U/L   Total Bilirubin 2.9 (H) 0.3 - 1.2 mg/dL   Bilirubin, Direct 1.4 (H) 0.0 - 0.2 mg/dL   Indirect Bilirubin 1.5 (H) 0.3 - 0.9 mg/dL    Comment: Performed at [facility] [address], [place], [place] [address]  CBC with Differential/Platelet     Status: Abnormal   Collection Time: 07/19/19  6:01 AM  Result Value Ref Range   WBC 14.3 (H) 4.0 - 10.5 K/uL    RBC 4.19 3.87 - 5.11 MIL/uL   Hemoglobin 12.3 12.0 - 15.0 g/dL   HCT 37.5 36.0 - 46.0 %   MCV 89.5 80.0 - 100.0 fL   MCH 29.4 26.0 - 34.0 pg   MCHC 32.8 30.0 - 36.0 g/dL   RDW 14.4 11.5 - 15.5 %   Platelets 170 150 - 400 K/uL   nRBC 0.0 0.0 - 0.2 %   Neutrophils Relative % 86 %   Neutro Abs 12.4 (H) 1.7 - 7.7 K/uL   Lymphocytes Relative 7 %   Lymphs Abs 1.0 0.7 - 4.0 K/uL   Monocytes Relative 6 %   Monocytes Absolute 0.8 0.1 - 1.0 K/uL   Eosinophils Relative 0 %   Eosinophils Absolute 0.0 0.0 - 0.5 K/uL   Basophils Relative 0 %   Basophils Absolute 0.0 0.0 - 0.1 K/uL   Immature Granulocytes 1 %   Abs Immature Granulocytes 0.10 (H) 0.00 - 0.07 K/uL    Comment: Performed at [facility] [address] [place],  [address]  Troponin I (High Sensitivity)     Status: Abnormal   Collection Time: 07/19/19  6:01 AM  Result Value Ref Range   Troponin I (High Sensitivity) 25 (H) <18 ng/L    Comment: (NOTE) Elevated high sensitivity troponin I (hsTnI) values and significant  changes across serial measurements may suggest ACS but many other  chronic and acute conditions are known to elevate hsTnI results.  Refer to the Links section for chest pain algorithms and additional  guidance. Performed at [facility], [place] [address], [place], [place] [address]   CBG monitoring, ED     Status: Abnormal   Collection Time: 07/19/19  8:17 AM  Result Value Ref Range   Glucose-Capillary 105 (H) 70 - 99 mg/dL   Comment 1 Notify RN    Comment 2 Document in Chart   APTT     Status: Abnormal   Collection Time: 07/19/19  9:00 AM  Result Value Ref Range   aPTT 79 (H) 24 - 36 seconds    Comment:        IF BASELINE aPTT IS ELEVATED, SUGGEST PATIENT RISK ASSESSMENT BE USED TO DETERMINE APPROPRIATE ANTICOAGULANT THERAPY. Performed at [facility], [place] [address] [place], [place] [address]    US Abdomen Complete  Result Date: 07/18/2019 CLINICAL DATA:  Epigastric and  right upper quadrant pain EXAM: ABDOMEN ULTRASOUND COMPLETE COMPARISON:  None. FINDINGS: Gallbladder: Gallbladder is mildly distended. 9 mm mobile gallstone noted. No wall thickening or sonographic Murphy's sign. Small amount of sludge within the gallbladder. Common bile duct: Diameter: Upper limits normal in caliber at 7 mm. Liver: Increased echotexture compatible with fatty infiltration. No focal abnormality or biliary ductal dilatation. Portal vein is patent on color Doppler imaging with normal direction of blood flow towards the liver. IVC: No abnormality visualized. Pancreas: Visualized portion unremarkable. Spleen: Size and appearance within normal limits. Right Kidney: Length: 9.5 cm. Echogenicity within normal limits. No mass or hydronephrosis visualized. Left Kidney: Length: 9.5 cm. Echogenicity within normal limits. No mass or hydronephrosis visualized. Abdominal aorta: No aneurysm visualized. Other findings: None. IMPRESSION: Cholelithiasis and sludge within the gallbladder. No evidence of acute cholecystitis. Fatty infiltration of the liver. Electronically Signed   By: Rolm Baptise M.D.   On: 07/18/2019 21:49   Mr 3d Recon At Scanner  Result Date: 07/19/2019 CLINICAL DATA:  Abnormal liver function studies. Cholelithiasis. EXAM: MRI ABDOMEN WITHOUT AND WITH CONTRAST (INCLUDING MRCP) TECHNIQUE: Multiplanar multisequence MR imaging of the abdomen was performed both before and after the administration of intravenous contrast. Heavily T2-weighted images of the biliary and pancreatic ducts were obtained, and three-dimensional MRCP images were rendered by post processing. CONTRAST:  80mL GADAVIST GADOBUTROL 1 MMOL/ML IV SOLN COMPARISON:  Abdominal ultrasound 07/18/2019. FINDINGS: Lower chest: Minimal atelectasis at both lung bases. The visualized lower chest otherwise appears unremarkable. Hepatobiliary: No significant hepatic steatosis or focal abnormality. There are several small dependent gallstones. The  common hepatic duct measures up to 11 mm in diameter. There are several (at least 3) calculi in the distal common bile duct, measuring up to 7 mm in diameter. No significant intrahepatic biliary dilatation. Pancreas: Unremarkable. No pancreatic ductal dilatation or surrounding inflammatory changes. Spleen: Normal in size without focal abnormality. Adrenals/Urinary Tract: Both adrenal glands appear normal. There are tiny renal cysts. The kidneys and ureters otherwise appear normal. No hydronephrosis. Stomach/Bowel: No evidence of bowel wall thickening, distention or surrounding inflammatory change. Vascular/Lymphatic: There are no enlarged abdominal lymph nodes. No significant vascular findings are present. Other: No ascites. Musculoskeletal: No acute or significant osseous findings. Mild lumbar spondylosis. IMPRESSION: 1. Cholelithiasis and choledocholithiasis with mild extrahepatic biliary dilatation. No evidence of cholecystitis. 2. No other significant findings. Electronically Signed   By: Richardean Sale M.D.   On: 07/19/2019 08:43   Mr Abdomen Mrcp Moise Boring Contast  Result Date: 07/19/2019 CLINICAL DATA:  Abnormal liver function studies. Cholelithiasis. EXAM:  MRI ABDOMEN WITHOUT AND WITH CONTRAST (INCLUDING MRCP) TECHNIQUE: Multiplanar multisequence MR imaging of the abdomen was performed both before and after the administration of intravenous contrast. Heavily T2-weighted images of the biliary and pancreatic ducts were obtained, and three-dimensional MRCP images were rendered by post processing. CONTRAST:  52mL GADAVIST GADOBUTROL 1 MMOL/ML IV SOLN COMPARISON:  Abdominal ultrasound 07/18/2019. FINDINGS: Lower chest: Minimal atelectasis at both lung bases. The visualized lower chest otherwise appears unremarkable. Hepatobiliary: No significant hepatic steatosis or focal abnormality. There are several small dependent gallstones. The common hepatic duct measures up to 11 mm in diameter. There are several (at least  3) calculi in the distal common bile duct, measuring up to 7 mm in diameter. No significant intrahepatic biliary dilatation. Pancreas: Unremarkable. No pancreatic ductal dilatation or surrounding inflammatory changes. Spleen: Normal in size without focal abnormality. Adrenals/Urinary Tract: Both adrenal glands appear normal. There are tiny renal cysts. The kidneys and ureters otherwise appear normal. No hydronephrosis. Stomach/Bowel: No evidence of bowel wall thickening, distention or surrounding inflammatory change. Vascular/Lymphatic: There are no enlarged abdominal lymph nodes. No significant vascular findings are present. Other: No ascites. Musculoskeletal: No acute or significant osseous findings. Mild lumbar spondylosis. IMPRESSION: 1. Cholelithiasis and choledocholithiasis with mild extrahepatic biliary dilatation. No evidence of cholecystitis. 2. No other significant findings. Electronically Signed   By: Richardean Sale M.D.   On: 07/19/2019 08:43    Pending Labs Unresulted Labs (From admission, onward)    Start     Ordered   07/20/19 0500  Heparin level (unfractionated)  Daily,   R     07/19/19 0018   07/20/19 0500  APTT  Daily,   R     07/19/19 0018   07/20/19 0500  CBC  Daily,   R     07/19/19 1313   07/19/19 1807  Comprehensive metabolic panel  ONCE - STAT,   STAT     07/19/19 1807          Vitals/Pain Today's Vitals   07/19/19 1700 07/19/19 1745 07/19/19 1800 07/19/19 1900  BP: 132/79  128/79 115/73  Pulse: 77 78  73  Resp: 15 19 (!) 21 17  Temp:      TempSrc:      SpO2: 95% 97%  95%  Weight:      Height:      PainSc:        Isolation Precautions No active isolations  Medications Medications  sodium chloride flush (NS) 0.9 % injection 3 mL (3 mLs Intravenous Not Given 07/18/19 1957)  lactated ringers infusion ( Intravenous New Bag/Given 07/19/19 0143)  acetaminophen (TYLENOL) tablet 650 mg (650 mg Oral Given 07/19/19 1230)    Or  acetaminophen (TYLENOL) suppository  650 mg ( Rectal See Alternative 07/19/19 1230)  ondansetron (ZOFRAN) tablet 4 mg (has no administration in time range)    Or  ondansetron (ZOFRAN) injection 4 mg (has no administration in time range)  morphine 2 MG/ML injection 1 mg (1 mg Intravenous Given 07/19/19 1742)  piperacillin-tazobactam (ZOSYN) IVPB 3.375 g (0 g Intravenous Stopped 07/19/19 1926)  heparin ADULT infusion 100 units/mL (25000 units/264mL sodium chloride 0.45%) (1,000 Units/hr Intravenous New Bag/Given 07/19/19 0128)  naproxen (NAPROSYN) tablet 500 mg (500 mg Oral Not Given 07/19/19 1607)  ALPRAZolam (XANAX) tablet 0.5 mg (has no administration in time range)  ondansetron (ZOFRAN) injection 4 mg (4 mg Intravenous Given 07/18/19 2040)  morphine 4 MG/ML injection 4 mg (4 mg Intravenous Given 07/18/19 2040)  sodium chloride 0.9 %  bolus 1,000 mL (0 mLs Intravenous Stopped 07/18/19 2206)  HYDROmorphone (DILAUDID) injection 0.5 mg (0.5 mg Intravenous Given 07/18/19 2111)  lactated ringers bolus 1,000 mL (0 mLs Intravenous Stopped 07/19/19 0404)  piperacillin-tazobactam (ZOSYN) IVPB 3.375 g (0 g Intravenous Stopped 07/19/19 0136)  gadobutrol (GADAVIST) 1 MMOL/ML injection 7 mL (7 mLs Intravenous Contrast Given 07/19/19 0827)    Mobility walks Low fall risk   Focused Assessments    R Recommendations: See Admitting Provider Note  Report given to:   Additional Notes:

## 2019-07-19 NOTE — ED Notes (Signed)
Pt is NSR on monitor 

## 2019-07-19 NOTE — ED Notes (Signed)
Family at bedside. 

## 2019-07-19 NOTE — Progress Notes (Signed)
ANTICOAGULATION/ANTIBIOTIC CONSULT NOTE   Pharmacy Consult for Heparin  Indication: afib  No Known Allergies  Patient Measurements: Height: 5\' 5"  (165.1 cm) Weight: 155 lb (70.3 kg) IBW/kg (Calculated) : 57 Heparin Dosing Weight: 70 kg  Vital Signs: BP: 109/68 (12/02 1200) Pulse Rate: 79 (12/02 1230)  Labs: Recent Labs    07/18/19 1801 07/19/19 0010 07/19/19 0601 07/19/19 0900  HGB 14.4  --  12.3  --   HCT 43.8  --  37.5  --   PLT 205  --  170  --   APTT  --   --   --  79*  HEPARINUNFRC  --  1.10*  --   --   CREATININE 0.87  --  1.00  --   TROPONINIHS  --   --  25*  --     Estimated Creatinine Clearance: 48.5 mL/min (by C-G formula based on SCr of 1 mg/dL).  Assessment: 73 y.o. F presents with abd pain. She is on chronic apixaban for afib. She was transitioned to heparin in anticipation of procedures. Initial aPTT is therapeutic at 79. CBC is WNL and no bleeding noted.   Goal of Therapy:  Heparin level 0.3-0.7 units/ml, PTT 66-102 sec Monitor platelets by anticoagulation protocol: Yes   Plan:  Continue heparin gtt 1000 units/hr Daily heparin level, aPTT and CBC  Salome Arnt, PharmD, BCPS Clinical Pharmacist Please see AMION for all pharmacy numbers 07/19/2019 1:14 PM

## 2019-07-19 NOTE — ED Notes (Signed)
Provider at bedside

## 2019-07-19 NOTE — ED Notes (Signed)
Pt in MRI.

## 2019-07-20 DIAGNOSIS — R945 Abnormal results of liver function studies: Secondary | ICD-10-CM

## 2019-07-20 LAB — HEPARIN LEVEL (UNFRACTIONATED): Heparin Unfractionated: 0.58 IU/mL (ref 0.30–0.70)

## 2019-07-20 LAB — GLUCOSE, CAPILLARY
Glucose-Capillary: 55 mg/dL — ABNORMAL LOW (ref 70–99)
Glucose-Capillary: 95 mg/dL (ref 70–99)
Glucose-Capillary: 86 mg/dL (ref 70–99)

## 2019-07-20 LAB — CBC
HCT: 39.6 % (ref 36.0–46.0)
Hemoglobin: 12.6 g/dL (ref 12.0–15.0)
MCH: 28.7 pg (ref 26.0–34.0)
MCHC: 31.8 g/dL (ref 30.0–36.0)
MCV: 90.2 fL (ref 80.0–100.0)
Platelets: 166 10*3/uL (ref 150–400)
RBC: 4.39 MIL/uL (ref 3.87–5.11)
RDW: 14.5 % (ref 11.5–15.5)
WBC: 9.1 10*3/uL (ref 4.0–10.5)
nRBC: 0 % (ref 0.0–0.2)

## 2019-07-20 LAB — APTT: aPTT: 81 seconds — ABNORMAL HIGH (ref 24–36)

## 2019-07-20 MED ORDER — ALPRAZOLAM 0.5 MG PO TABS
0.5000 mg | ORAL_TABLET | Freq: Every day | ORAL | Status: DC
  Administered 2019-07-20 – 2019-07-22 (×3): 0.5 mg via ORAL
  Filled 2019-07-20 (×3): qty 1

## 2019-07-20 MED ORDER — TRAZODONE HCL 50 MG PO TABS
50.0000 mg | ORAL_TABLET | Freq: Every day | ORAL | Status: DC
  Administered 2019-07-20 – 2019-07-22 (×3): 50 mg via ORAL
  Filled 2019-07-20 (×3): qty 1

## 2019-07-20 MED ORDER — BACLOFEN 5 MG HALF TABLET
20.0000 mg | ORAL_TABLET | Freq: Every day | ORAL | Status: DC
  Administered 2019-07-20 – 2019-07-22 (×3): 20 mg via ORAL
  Filled 2019-07-20 (×2): qty 4
  Filled 2019-07-20: qty 1

## 2019-07-20 MED ORDER — DEXTROSE 50 % IV SOLN
INTRAVENOUS | Status: AC
  Administered 2019-07-20: 25 mL
  Filled 2019-07-20: qty 50

## 2019-07-20 NOTE — H&P (View-Only) (Signed)
Daily Rounding Note  07/20/2019, 9:13 AM  LOS: 2 days   SUBJECTIVE:   Chief complaint: Abdominal pain. Abdominal pain improved.  She wants someone to switch her pain meds because she thinks morphine may be causing a headache so she is also thinking the headache may be a result of having been kept n.p.o. for more than 24 hours. Has been started on bid Naprosyn by the hospitalist. Also asking if she can get a dose of Xanax during the day, normally only takes it at night. No nausea, no vomiting.  Tolerating clear liquids this morning.  Extremely anxious.  OBJECTIVE:         Vital signs in last 24 hours:    Temp:  [98 F (36.7 C)-98.3 F (36.8 C)] 98.3 F (36.8 C) (12/03 0800) Pulse Rate:  [71-82] 78 (12/03 0453) Resp:  [14-21] 18 (12/03 0453) BP: (100-132)/(53-79) 118/68 (12/03 0453) SpO2:  [93 %-99 %] 94 % (12/03 0453)   Filed Weights   07/19/19 0000  Weight: 70.3 kg   General: Anxious, does not look ill.  Does not look uncomfortable Heart: RRR Chest: Clear bilaterally. Abdomen: Soft, nontender, nondistended.  Active bowel sounds talked her way through the entire abdominal exam so she was well distracted during palpation. Extremities: No CCE. Neuro/Psych: Very anxious.  Oriented x3.  Intake/Output from previous day: 12/02 0701 - 12/03 0700 In: 200 [IV Piggyback:200] Out: -   Intake/Output this shift: No intake/output data recorded.  Lab Results: Recent Labs    07/18/19 1801 07/19/19 0601  WBC 12.6* 14.3*  HGB 14.4 12.3  HCT 43.8 37.5  PLT 205 170   BMET Recent Labs    07/18/19 1801 07/19/19 0601 07/19/19 2124  NA 140 138 141  K 3.8 3.6 3.4*  CL 105 108 108  CO2 22 22 22   GLUCOSE 117* 131* 96  BUN 10 11 10   CREATININE 0.87 1.00 0.91  CALCIUM 9.6 8.6* 9.0   LFT Recent Labs    07/18/19 1801 07/19/19 0601 07/19/19 2124  PROT 7.1 5.2* 6.0*  ALBUMIN 4.3 3.0* 3.2*  AST 476* 317* 190*  ALT  542* 451* 375*  ALKPHOS 213* 183* 187*  BILITOT 2.9* 2.9* 2.7*  BILIDIR  --  1.4*  --   IBILI  --  1.5*  --    PT/INR No results for input(s): LABPROT, INR in the last 72 hours. Hepatitis Panel No results for input(s): HEPBSAG, HCVAB, HEPAIGM, HEPBIGM in the last 72 hours.  Studies/Results: US Abdomen Complete  Result Date: 07/18/2019 CLINICAL DATA:  Epigastric and right upper quadrant pain EXAM: ABDOMEN ULTRASOUND COMPLETE COMPARISON:  None. FINDINGS: Gallbladder: Gallbladder is mildly distended. 9 mm mobile gallstone noted. No wall thickening or sonographic Murphy's sign. Small amount of sludge within the gallbladder. Common bile duct: Diameter: Upper limits normal in caliber at 7 mm. Liver: Increased echotexture compatible with fatty infiltration. No focal abnormality or biliary ductal dilatation. Portal vein is patent on color Doppler imaging with normal direction of blood flow towards the liver. IVC: No abnormality visualized. Pancreas: Visualized portion unremarkable. Spleen: Size and appearance within normal limits. Right Kidney: Length: 9.5 cm. Echogenicity within normal limits. No mass or hydronephrosis visualized. Left Kidney: Length: 9.5 cm. Echogenicity within normal limits. No mass or hydronephrosis visualized. Abdominal aorta: No aneurysm visualized. Other findings: None. IMPRESSION: Cholelithiasis and sludge within the gallbladder. No evidence of acute cholecystitis. Fatty infiltration of the liver. Electronically Signed   By: Lennette Bihari  [place]over M.D.   On: 07/18/2019 21:49   Mr 3d Recon At Scanner Mr Abdomen Mrcp Moise Boring Contast  Result Date: 07/19/2019 CLINICAL DATA:  Abnormal liver function studies. Cholelithiasis. EXAM: MRI ABDOMEN WITHOUT AND WITH CONTRAST (INCLUDING MRCP) TECHNIQUE: Multiplanar multisequence MR imaging of the abdomen was performed both before and after the administration of intravenous contrast. Heavily T2-weighted images of the biliary and pancreatic ducts were  obtained, and three-dimensional MRCP images were rendered by post processing. CONTRAST:  17mL GADAVIST GADOBUTROL 1 MMOL/ML IV SOLN COMPARISON:  Abdominal ultrasound 07/18/2019. FINDINGS: Lower chest: Minimal atelectasis at both lung bases. The visualized lower chest otherwise appears unremarkable. Hepatobiliary: No significant hepatic steatosis or focal abnormality. There are several small dependent gallstones. The common hepatic duct measures up to 11 mm in diameter. There are several (at least 3) calculi in the distal common bile duct, measuring up to 7 mm in diameter. No significant intrahepatic biliary dilatation. Pancreas: Unremarkable. No pancreatic ductal dilatation or surrounding inflammatory changes. Spleen: Normal in size without focal abnormality. Adrenals/Urinary Tract: Both adrenal glands appear normal. There are tiny renal cysts. The kidneys and ureters otherwise appear normal. No hydronephrosis. Stomach/Bowel: No evidence of bowel wall thickening, distention or surrounding inflammatory change. Vascular/Lymphatic: There are no enlarged abdominal lymph nodes. No significant vascular findings are present. Other: No ascites. Musculoskeletal: No acute or significant osseous findings. Mild lumbar spondylosis. IMPRESSION: 1. Cholelithiasis and choledocholithiasis with mild extrahepatic biliary dilatation. No evidence of cholecystitis. 2. No other significant findings. Electronically Signed   By: Richardean Sale M.D.   On: 07/19/2019 08:43    ASSESMENT:   *    Progressive epigastric abdominal pain with elevated LFTs. Ultrasound with fatty liver.  Gallbladder stones and sludge but no signs of acute cholecystitis. MRCP choledocholithiasis, cholelithiasis, 7 mm CBD. Day 3 Zosyn LFTs improved.    *     Chronic Eliquis for history of A. Fib.  Last dose 12/1 Currently on heparin drip.  *   Anxiety.  Seems to be exacerbated by hospitalization, recent medical events.  Home meds include Xanax at  bedtime.   PLAN   *   ERCP set for 10:30 AM tomorrow with Dr. Rush Landmark.  Continue clears for the rest of the day, n.p.o. after midnight.  *    Will defer any changes in pain and anxiety mgt to Dr. Verlon Au. Wonder if Percocet might be a better fit for her than the IV morphine?  *    Need to stop heparin drip at 0500 tmrw, orders entered.      Carly Rhodes  07/20/2019, 9:13 AM Phone [phone]

## 2019-07-20 NOTE — Progress Notes (Signed)
ANTICOAGULATION CONSULT NOTE - Follow Up Consult  Pharmacy Consult for Heparin Indication: atrial fibrillation  No Known Allergies  Patient Measurements: Height: 5\' 5"  (165.1 cm) Weight: 155 lb (70.3 kg) IBW/kg (Calculated) : 57  Vital Signs: Temp: 98.3 F (36.8 C) (12/03 0800) Temp Source: Oral (12/03 0800) BP: 118/68 (12/03 0453) Pulse Rate: 78 (12/03 0453)  Labs: Recent Labs    07/18/19 1801 07/19/19 0010 07/19/19 0601 07/19/19 0900 07/19/19 2124 07/20/19 0256  HGB 14.4  --  12.3  --   --  12.6  HCT 43.8  --  37.5  --   --  39.6  PLT 205  --  170  --   --  166  APTT  --   --   --  79*  --  81*  HEPARINUNFRC  --  1.10*  --   --   --  0.58  CREATININE 0.87  --  1.00  --  0.91  --   TROPONINIHS  --   --  25*  --   --   --     Estimated Creatinine Clearance: 53.3 mL/min (by C-G formula based on SCr of 0.91 mg/dL).  Assessment:  Anticoag: Heparin for hx afib (PTA apixban - LD 12/1) - aPTT 81 in goal, HL 0.58 in goal. CBC WNL, no bleeding noted  Goal of Therapy:  Heparin level 0.3-0.7 units/ml  aptt 66-102 Monitor platelets by anticoagulation protocol: Yes   Plan:  Continue Heparin gtt at 1000 units/hr Daily heparin level, PTT, and CBC ERCP 12/4   Jalin Alicea S. Alford Highland, PharmD, BCPS Clinical Staff Pharmacist Eilene Ghazi Stillinger 07/20/2019,10:26 AM

## 2019-07-20 NOTE — Progress Notes (Signed)
Daily Rounding Note  07/20/2019, 9:13 AM  LOS: 2 days   SUBJECTIVE:   Chief complaint: Abdominal pain. Abdominal pain improved.  She wants someone to switch her pain meds because she thinks morphine may be causing a headache so she is also thinking the headache may be a result of having been kept n.p.o. for more than 24 hours. Has been started on bid Naprosyn by the hospitalist. Also asking if she can get a dose of Xanax during the day, normally only takes it at night. No nausea, no vomiting.  Tolerating clear liquids this morning.  Extremely anxious.  OBJECTIVE:         Vital signs in last 24 hours:    Temp:  [98 F (36.7 C)-98.3 F (36.8 C)] 98.3 F (36.8 C) (12/03 0800) Pulse Rate:  [71-82] 78 (12/03 0453) Resp:  [14-21] 18 (12/03 0453) BP: (100-132)/(53-79) 118/68 (12/03 0453) SpO2:  [93 %-99 %] 94 % (12/03 0453)   Filed Weights   07/19/19 0000  Weight: 70.3 kg   General: Anxious, does not look ill.  Does not look uncomfortable Heart: RRR Chest: Clear bilaterally. Abdomen: Soft, nontender, nondistended.  Active bowel sounds talked her way through the entire abdominal exam so she was well distracted during palpation. Extremities: No CCE. Neuro/Psych: Very anxious.  Oriented x3.  Intake/Output from previous day: 12/02 0701 - 12/03 0700 In: 200 [IV Piggyback:200] Out: -   Intake/Output this shift: No intake/output data recorded.  Lab Results: Recent Labs    07/18/19 1801 07/19/19 0601  WBC 12.6* 14.3*  HGB 14.4 12.3  HCT 43.8 37.5  PLT 205 170   BMET Recent Labs    07/18/19 1801 07/19/19 0601 07/19/19 2124  NA 140 138 141  K 3.8 3.6 3.4*  CL 105 108 108  CO2 22 22 22   GLUCOSE 117* 131* 96  BUN 10 11 10   CREATININE 0.87 1.00 0.91  CALCIUM 9.6 8.6* 9.0   LFT Recent Labs    07/18/19 1801 07/19/19 0601 07/19/19 2124  PROT 7.1 5.2* 6.0*  ALBUMIN 4.3 3.0* 3.2*  AST 476* 317* 190*  ALT  542* 451* 375*  ALKPHOS 213* 183* 187*  BILITOT 2.9* 2.9* 2.7*  BILIDIR  --  1.4*  --   IBILI  --  1.5*  --    PT/INR No results for input(s): LABPROT, INR in the last 72 hours. Hepatitis Panel No results for input(s): HEPBSAG, HCVAB, HEPAIGM, HEPBIGM in the last 72 hours.  Studies/Results: US Abdomen Complete  Result Date: 07/18/2019 CLINICAL DATA:  Epigastric and right upper quadrant pain EXAM: ABDOMEN ULTRASOUND COMPLETE COMPARISON:  None. FINDINGS: Gallbladder: Gallbladder is mildly distended. 9 mm mobile gallstone noted. No wall thickening or sonographic Murphy's sign. Small amount of sludge within the gallbladder. Common bile duct: Diameter: Upper limits normal in caliber at 7 mm. Liver: Increased echotexture compatible with fatty infiltration. No focal abnormality or biliary ductal dilatation. Portal vein is patent on color Doppler imaging with normal direction of blood flow towards the liver. IVC: No abnormality visualized. Pancreas: Visualized portion unremarkable. Spleen: Size and appearance within normal limits. Right Kidney: Length: 9.5 cm. Echogenicity within normal limits. No mass or hydronephrosis visualized. Left Kidney: Length: 9.5 cm. Echogenicity within normal limits. No mass or hydronephrosis visualized. Abdominal aorta: No aneurysm visualized. Other findings: None. IMPRESSION: Cholelithiasis and sludge within the gallbladder. No evidence of acute cholecystitis. Fatty infiltration of the liver. Electronically Signed   By: Lennette Bihari  Dover M.D.   On: 07/18/2019 21:49   Mr 3d Recon At Scanner Mr Abdomen Mrcp Moise Boring Contast  Result Date: 07/19/2019 CLINICAL DATA:  Abnormal liver function studies. Cholelithiasis. EXAM: MRI ABDOMEN WITHOUT AND WITH CONTRAST (INCLUDING MRCP) TECHNIQUE: Multiplanar multisequence MR imaging of the abdomen was performed both before and after the administration of intravenous contrast. Heavily T2-weighted images of the biliary and pancreatic ducts were  obtained, and three-dimensional MRCP images were rendered by post processing. CONTRAST:  3mL GADAVIST GADOBUTROL 1 MMOL/ML IV SOLN COMPARISON:  Abdominal ultrasound 07/18/2019. FINDINGS: Lower chest: Minimal atelectasis at both lung bases. The visualized lower chest otherwise appears unremarkable. Hepatobiliary: No significant hepatic steatosis or focal abnormality. There are several small dependent gallstones. The common hepatic duct measures up to 11 mm in diameter. There are several (at least 3) calculi in the distal common bile duct, measuring up to 7 mm in diameter. No significant intrahepatic biliary dilatation. Pancreas: Unremarkable. No pancreatic ductal dilatation or surrounding inflammatory changes. Spleen: Normal in size without focal abnormality. Adrenals/Urinary Tract: Both adrenal glands appear normal. There are tiny renal cysts. The kidneys and ureters otherwise appear normal. No hydronephrosis. Stomach/Bowel: No evidence of bowel wall thickening, distention or surrounding inflammatory change. Vascular/Lymphatic: There are no enlarged abdominal lymph nodes. No significant vascular findings are present. Other: No ascites. Musculoskeletal: No acute or significant osseous findings. Mild lumbar spondylosis. IMPRESSION: 1. Cholelithiasis and choledocholithiasis with mild extrahepatic biliary dilatation. No evidence of cholecystitis. 2. No other significant findings. Electronically Signed   By: Richardean Sale M.D.   On: 07/19/2019 08:43    ASSESMENT:   *    Progressive epigastric abdominal pain with elevated LFTs. Ultrasound with fatty liver.  Gallbladder stones and sludge but no signs of acute cholecystitis. MRCP choledocholithiasis, cholelithiasis, 7 mm CBD. Day 3 Zosyn LFTs improved.    *     Chronic Eliquis for history of A. Fib.  Last dose 12/1 Currently on heparin drip.  *   Anxiety.  Seems to be exacerbated by hospitalization, recent medical events.  Home meds include Xanax at  bedtime.   PLAN   *   ERCP set for 10:30 AM tomorrow with Dr. Rush Landmark.  Continue clears for the rest of the day, n.p.o. after midnight.  *    Will defer any changes in pain and anxiety mgt to Dr. Verlon Au. Wonder if Percocet might be a better fit for her than the IV morphine?  *    Need to stop heparin drip at 0500 tmrw, orders entered.      Carly Rhodes  07/20/2019, 9:13 AM Phone [phone]

## 2019-07-20 NOTE — Progress Notes (Signed)
Carly Rhodes is a 73 y.o. female patient admitted from ED awake, alert - oriented  X 4 - no acute distress noted.  VSS - Blood pressure 118/68, pulse 78, temperature 98.2 F (36.8 C), temperature source Oral, resp. rate 20, height 5\' 5"  (1.651 m), weight 70.3 kg, SpO2 94 %.    IV in place, occlusive dsg intact without redness.  Orientation to room, and floor completed with information packet given to patient/family.  Patient declined safety video at this time.  Admission INP armband ID verified with patient/family, and in place.   SR up x 2, fall assessment complete, with patient and family able to verbalize understanding of risk associated with falls, and verbalized understanding to call nsg before up out of bed.  Call light within reach, patient able to voice, and demonstrate understanding.  Skin, clean-dry- intact without evidence of bruising, or skin tears.   No evidence of skin break down noted on exam.     Will cont to eval and treat per MD orders.  [place], RN 07/20/2019 6:03 AM

## 2019-07-20 NOTE — Progress Notes (Signed)
Hospitalist daily note   [place] QH:[id number] DOB: 08/28/1945 DOA: 07/18/2019  PCP: Aura Dials, MD   Narrative:  55 WF A. fib [place] score >4/Eliquis bipolar, hypothyroid normal colonoscopy 2007 2013 admitted 12/1 choledocholithiasis abdominal pain  Data Reviewed:  Potassium 3.4 AST/ALT 190/3375 (on admission 476/542) bilirubin 2.9 White count 9.1 on admission 14 Ultrasound abdomen 12/1 cholelithiasis sludge as well asfatty infiltration MRCP 12/2 choledocholithiasis Assessment & Plan: Choledocholelithiasis-planning ERCP a.m. 12/4 consulted general surgery rest as per GI Atrial fibrillation-Eliquis 5 on hold, heparin GTT for now until cleared by GI Anxiety-resuming trazodone 50, Xanax increasing to 0.5 bid   Subjective: Anxious states doesn't like feeling of morphine-some n today no cp no fever no chills Consultants:   Gi  gen surg  Antimicrobials:   Zosyn   Objective: Vitals:   07/20/19 0453 07/20/19 0800 07/20/19 1200 07/20/19 1240  BP: 118/68 122/85 131/77   Pulse: 78     Resp: 18 17 19    Temp: 98.2 F (36.8 C) 98.3 F (36.8 C)  97.6 F (36.4 C)  TempSrc: Oral Oral  Oral  SpO2: 94%     Weight:      Height:        Intake/Output Summary (Last 24 hours) at 07/20/2019 1254 Last data filed at 07/19/2019 1926 Gross per 24 hour  Intake 200 ml  Output -  Net 200 ml   Filed Weights   07/19/19 0000  Weight: 70.3 kg    Examination: Awake coherent in nad no focal deficit  eomi but slight anxious cta b no adde dsound no rales no rhonchi  abd soft mild RUQ tender no rebound no gaurd  Scheduled Meds: . naproxen  500 mg Oral BID WC  . sodium chloride flush  3 mL Intravenous Once   Continuous Infusions: . heparin 1,000 Units/hr (07/20/19 0757)  . piperacillin-tazobactam (ZOSYN)  IV 3.375 g (07/20/19 0759)     LOS: 2 days   Time spent: [place], MD [facility]

## 2019-07-20 NOTE — Progress Notes (Signed)
Hypoglycemic Event  CBG: 55  Treatment: D50 25 mL (12.5 gm)  Symptoms: Hungry  Follow-up CBG: Time: [id number] CBG Result: 95  Possible Reasons for Event: Inadequate meal intake  Comments/MD notified: MD Mackie Pai

## 2019-07-21 ENCOUNTER — Encounter (HOSPITAL_COMMUNITY)
Admission: EM | Disposition: A | Discharge status: Home / Self Care | Payer: Self-pay | Source: Home / Self Care | Attending: Family Medicine

## 2019-07-21 ENCOUNTER — Inpatient Hospital Stay (HOSPITAL_COMMUNITY): Payer: PPO

## 2019-07-21 ENCOUNTER — Inpatient Hospital Stay (HOSPITAL_COMMUNITY): Payer: PPO | Admitting: Certified Registered Nurse Anesthetist

## 2019-07-21 ENCOUNTER — Encounter (HOSPITAL_COMMUNITY): Payer: Self-pay | Admitting: Certified Registered Nurse Anesthetist

## 2019-07-21 DIAGNOSIS — K838 Other specified diseases of biliary tract: Secondary | ICD-10-CM

## 2019-07-21 HISTORY — PX: SPHINCTEROTOMY: SHX5544

## 2019-07-21 HISTORY — PX: REMOVAL OF STONES: SHX5545

## 2019-07-21 HISTORY — PX: PANCREATIC STENT PLACEMENT: SHX5539

## 2019-07-21 HISTORY — PX: ERCP: SHX5425

## 2019-07-21 HISTORY — PX: BILIARY DILATION: SHX6850

## 2019-07-21 LAB — HEPATIC FUNCTION PANEL
ALT: 249 U/L — ABNORMAL HIGH (ref 0–44)
AST: 114 U/L — ABNORMAL HIGH (ref 15–41)
Albumin: 3 g/dL — ABNORMAL LOW (ref 3.5–5.0)
Alkaline Phosphatase: 172 U/L — ABNORMAL HIGH (ref 38–126)
Bilirubin, Direct: 0.6 mg/dL — ABNORMAL HIGH (ref 0.0–0.2)
Indirect Bilirubin: 0.8 mg/dL (ref 0.3–0.9)
Total Bilirubin: 1.4 mg/dL — ABNORMAL HIGH (ref 0.3–1.2)
Total Protein: 5.4 g/dL — ABNORMAL LOW (ref 6.5–8.1)

## 2019-07-21 LAB — APTT: aPTT: 58 seconds — ABNORMAL HIGH (ref 24–36)

## 2019-07-21 LAB — GLUCOSE, CAPILLARY
Glucose-Capillary: 95 mg/dL (ref 70–99)
Glucose-Capillary: 158 mg/dL — ABNORMAL HIGH (ref 70–99)

## 2019-07-21 LAB — CBC
HCT: 36 % (ref 36.0–46.0)
Hemoglobin: 11.9 g/dL — ABNORMAL LOW (ref 12.0–15.0)
MCH: 28.9 pg (ref 26.0–34.0)
MCHC: 33.1 g/dL (ref 30.0–36.0)
MCV: 87.4 fL (ref 80.0–100.0)
Platelets: 188 10*3/uL (ref 150–400)
RBC: 4.12 MIL/uL (ref 3.87–5.11)
RDW: 13.9 % (ref 11.5–15.5)
WBC: 5.3 10*3/uL (ref 4.0–10.5)
nRBC: 0 % (ref 0.0–0.2)

## 2019-07-21 LAB — HEPARIN LEVEL (UNFRACTIONATED): Heparin Unfractionated: 0.47 IU/mL (ref 0.30–0.70)

## 2019-07-21 SURGERY — ERCP, WITH INTERVENTION IF INDICATED
Anesthesia: General

## 2019-07-21 MED ORDER — FENTANYL CITRATE (PF) 100 MCG/2ML IJ SOLN: 25.0000 ug | INTRAMUSCULAR | Status: DC | PRN

## 2019-07-21 MED ORDER — GLUCAGON HCL RDNA (DIAGNOSTIC) 1 MG IJ SOLR
INTRAMUSCULAR | Status: DC | PRN
  Administered 2019-07-21 (×3): 0.25 mg via INTRAVENOUS

## 2019-07-21 MED ORDER — SODIUM CHLORIDE 0.9 % IV SOLN: INTRAVENOUS | Status: DC

## 2019-07-21 MED ORDER — GLUCAGON HCL RDNA (DIAGNOSTIC) 1 MG IJ SOLR
INTRAMUSCULAR | Status: AC
  Filled 2019-07-21: qty 1

## 2019-07-21 MED ORDER — ONDANSETRON HCL 4 MG/2ML IJ SOLN
INTRAMUSCULAR | Status: DC | PRN
  Administered 2019-07-21: 4 mg via INTRAVENOUS

## 2019-07-21 MED ORDER — DEXAMETHASONE SODIUM PHOSPHATE 10 MG/ML IJ SOLN
INTRAMUSCULAR | Status: DC | PRN
  Administered 2019-07-21: 5 mg via INTRAVENOUS

## 2019-07-21 MED ORDER — INDOMETHACIN 50 MG RE SUPP
RECTAL | Status: AC
  Filled 2019-07-21: qty 2

## 2019-07-21 MED ORDER — SODIUM CHLORIDE 0.9 % IV SOLN
INTRAVENOUS | Status: DC | PRN
  Administered 2019-07-21: 80 mL

## 2019-07-21 MED ORDER — LIDOCAINE 2% (20 MG/ML) 5 ML SYRINGE
INTRAMUSCULAR | Status: DC | PRN
  Administered 2019-07-21: 60 mg via INTRAVENOUS

## 2019-07-21 MED ORDER — FENTANYL CITRATE (PF) 100 MCG/2ML IJ SOLN
INTRAMUSCULAR | Status: DC | PRN
  Administered 2019-07-21: 50 ug via INTRAVENOUS
  Administered 2019-07-21 (×2): 25 ug via INTRAVENOUS

## 2019-07-21 MED ORDER — EPHEDRINE SULFATE-NACL 50-0.9 MG/10ML-% IV SOSY
PREFILLED_SYRINGE | INTRAVENOUS | Status: DC | PRN
  Administered 2019-07-21 (×2): 10 mg via INTRAVENOUS

## 2019-07-21 MED ORDER — PROMETHAZINE HCL 25 MG/ML IJ SOLN: 6.2500 mg | INTRAMUSCULAR | Status: DC | PRN

## 2019-07-21 MED ORDER — INDOMETHACIN 50 MG RE SUPP
RECTAL | Status: DC | PRN
  Administered 2019-07-21: 100 mg via RECTAL

## 2019-07-21 MED ORDER — SUCCINYLCHOLINE CHLORIDE 200 MG/10ML IV SOSY
PREFILLED_SYRINGE | INTRAVENOUS | Status: DC | PRN
  Administered 2019-07-21: 80 mg via INTRAVENOUS

## 2019-07-21 MED ORDER — PROPOFOL 10 MG/ML IV BOLUS
INTRAVENOUS | Status: DC | PRN
  Administered 2019-07-21: 50 mg via INTRAVENOUS
  Administered 2019-07-21: 100 mg via INTRAVENOUS

## 2019-07-21 MED ORDER — LACTATED RINGERS IV SOLN
INTRAVENOUS | Status: DC | PRN
  Administered 2019-07-21: 10:00:00 via INTRAVENOUS

## 2019-07-21 NOTE — Interval H&P Note (Signed)
History and Physical Interval Note:  07/21/2019 10:31 AM  Carly Rhodes  has presented today for surgery, with the diagnosis of choledocholithiasis.  The various methods of treatment have been discussed with the patient and family. After consideration of risks, benefits and other options for treatment, the patient has consented to  Procedure(s): ENDOSCOPIC RETROGRADE CHOLANGIOPANCREATOGRAPHY (ERCP) (N/A) as a surgical intervention.  The patient's history has been reviewed, patient examined, no change in status, stable for surgery.  I have reviewed the patient's chart and labs.  Questions were answered to the patient's satisfaction.     [employer]

## 2019-07-21 NOTE — Progress Notes (Signed)
ANTICOAGULATION CONSULT NOTE - Follow Up Consult  Pharmacy Consult for Heparin Indication: atrial fibrillation   No Known Allergies  Patient Measurements: Height: 5\' 5"  (165.1 cm) Weight: 167 lb 8.8 oz (76 kg) IBW/kg (Calculated) : 57  Vital Signs: Temp: 97.7 F (36.5 C) (12/04 0401) Temp Source: Oral (12/04 0401) BP: 119/75 (12/04 0401) Pulse Rate: 71 (12/04 0401)  Labs: Recent Labs    07/18/19 1801 07/19/19 0010 07/19/19 0601 07/19/19 0900 07/19/19 2124 07/20/19 0256 07/21/19 0600  HGB 14.4  --  12.3  --   --  12.6 11.9*  HCT 43.8  --  37.5  --   --  39.6 36.0  PLT 205  --  170  --   --  166 188  APTT  --   --   --  79*  --  81* 58*  HEPARINUNFRC  --  1.10*  --   --   --  0.58 0.47  CREATININE 0.87  --  1.00  --  0.91  --   --   TROPONINIHS  --   --  25*  --   --   --   --     Estimated Creatinine Clearance: 55.3 mL/min (by C-G formula based on SCr of 0.91 mg/dL).  Assessment:   Anticoag: Heparin for hx afib (PTA apixban - LD 12/1), HL 0.47 in goal. Hgb 12.6>11.9. Plts stable, no bleeding noted   Goal of Therapy:  Heparin level 0.3-0.7 units/ml  Monitor platelets by anticoagulation protocol: Yes   Plan:   Heparin Off this AM for ERCP and lap chole. F/u to resume post-op Daily heparin level and CBC   Lua Feng S. Alford Highland, PharmD, BCPS Clinical Staff Pharmacist Eilene Ghazi Stillinger 07/21/2019,9:26 AM

## 2019-07-21 NOTE — Anesthesia Preprocedure Evaluation (Signed)
Anesthesia Evaluation  Patient identified by MRN, date of birth, ID band Patient awake    Reviewed: Allergy & Precautions, NPO status , Patient's Chart, lab work & pertinent test results  Airway Mallampati: II  TM Distance: >3 FB Neck ROM: Full    Dental no notable dental hx.    Pulmonary neg pulmonary ROS,    Pulmonary exam normal breath sounds clear to auscultation       Cardiovascular negative cardio ROS Normal cardiovascular exam+ dysrhythmias Atrial Fibrillation  Rhythm:Irregular Rate:Normal     Neuro/Psych negative neurological ROS  negative psych ROS   GI/Hepatic negative GI ROS, Neg liver ROS,   Endo/Other  negative endocrine ROS  Renal/GU negative Renal ROS  negative genitourinary   Musculoskeletal negative musculoskeletal ROS (+)   Abdominal   Peds negative pediatric ROS (+)  Hematology negative hematology ROS (+)   Anesthesia Other Findings   Reproductive/Obstetrics negative OB ROS                             Anesthesia Physical Anesthesia Plan  ASA: III  Anesthesia Plan: General   Post-op Pain Management:    Induction: Intravenous  PONV Risk Score and Plan: 3 and Ondansetron, Dexamethasone and Treatment may vary due to age or medical condition  Airway Management Planned: Oral ETT  Additional Equipment:   Intra-op Plan:   Post-operative Plan: Extubation in OR  Informed Consent: I have reviewed the patients History and Physical, chart, labs and discussed the procedure including the risks, benefits and alternatives for the proposed anesthesia with the patient or authorized representative who has indicated his/her understanding and acceptance.     Dental advisory given  Plan Discussed with: CRNA and Surgeon  Anesthesia Plan Comments:         Anesthesia Quick Evaluation

## 2019-07-21 NOTE — Op Note (Addendum)
[facility] Patient Name: Carly Rhodes Procedure Date : 07/21/2019 MRN: [id number] Attending MD: Justice Britain , MD Date of Birth: 04/02/1946 CSN: [id number] Age: 73 Admit Type: Inpatient Procedure:                ERCP Indications:              Bile duct stone(s), Bile duct stone on magnetic                            resonance cholangiopancreatography, Evaluation and                            possible treatment of bile duct stone(s), For                            therapy of bile duct stone(s), Abnormal liver                            function test Providers:                Justice Britain, MD, Carlyn Reichert, RN, Cletis Athens, Technician Referring MD:             [facility], GI Inpatient Team (Dr.                            Claudie Revering Zehr) Medicines:                General Anesthesia, Indomethacin 100 mg PR,                            Glucagon 1 mg IV Complications:            No immediate complications. Estimated Blood Loss:     Estimated blood loss: none. Procedure:                Pre-Anesthesia Assessment:                           - Prior to the procedure, a History and Physical                            was performed, and patient medications and                            allergies were reviewed. The patient's tolerance of                            previous anesthesia was also reviewed. The risks                            and benefits of the procedure and the sedation                            options and risks were discussed  with the patient.                            All questions were answered, and informed consent                            was obtained. Prior Anticoagulants: The patient has                            taken no previous anticoagulant or antiplatelet                            agents. ASA Grade Assessment: III - A patient with                            severe systemic disease.  After reviewing the risks                            and benefits, the patient was deemed in                            satisfactory condition to undergo the procedure.                           After obtaining informed consent, the scope was                            passed under direct vision. Throughout the                            procedure, the patient's blood pressure, pulse, and                            oxygen saturations were monitored continuously. The                            TJF-Q180V ([id number]) Olympus duodenoscope was                            introduced through the mouth, and used to inject                            contrast into and used to inject contrast into the                            bile duct and ventral pancreatic duct. The ERCP was                            unusually difficult due to challenging cannulation.                            Successful completion of the procedure was aided by  performing the maneuvers documented (below) in this                            report. The patient tolerated the procedure. Scope In: Scope Out: 12:58:10 PM Findings:      The scout film was normal.      The esophagus was successfully intubated under direct vision without       detailed examination of the pharynx, larynx, and associated structures,       and upper GI tract. The major papilla was small. The patient's       positioning required a semi-long to long position for adequate       visualization of the ampulla.      The bile duct could not be cannulated initially. Repeated attempts at       biliary cannulation were not successful while using a wire-guided       approach. This led to placement of the wire within the pancreatic duct.       Decision was made to pursue a double-wire approach. The wire was left       within the pancreatic duct. A short 0.035 inch Soft Jagwire was passed       into the ventral pancreatic duct. The ventral  pancreatic duct was then       deeply cannulated with the Autotome sphincterotome. Contrast was       injected. I personally interpreted the pancreatic duct images. Ductal       flow of contrast was adequate. Image quality was adequate. Contrast       extended to the pancreatic duct. Opacification of the entire pancreatic       ductal system was successful. The maximum diameter of the ducts was 2       mm. Repeated attempts with double-wire cannulation were not successful.       The bile duct could not be cannulated. One 4 Fr by 7 cm plastic       pancreatic stent with a single external pigtail was placed into the       ventral pancreatic duct. Clear fluid flowed through the stent. The stent       was in good position.      The bile duct could not be cannulated trying to use the stent as a       guide. Decision made to perform a biliary pre-cut fistulotomy measuring       6 mm in length was made with a monofilament needle knife over a       pancreatic stent using ERBE electrocautery. There was no       post-sphincterotomy bleeding. A short 0.035 inch Soft Jagwire was passed       into the biliary tree. The Autotome sphincterotome was passed over the       guidewire and the bile duct was then deeply cannulated. Contrast was       injected. I personally interpreted the biliary duct images.       Opacification of the entire biliary tree except for the gallbladder was       successful. The middle third of the main bile duct and upper third of       the main bile duct were moderately dilated. The largest diameter was 11       mm. The lower third of the main bile duct and middle third of the main  bile duct contained filling defects thought to be stones and sludge. The       biliary sphincterotomy was extended to a total of 8 mm in length with a       monofilament Autotome sphincterotome using ERBE electrocautery. There       was no post-sphincterotomy bleeding. Dilation of the common bile  duct as       a sphincteroplasty with an 03-25-09 mm balloon (to a maximum balloon size       of 9 mm) dilator was successful. To discover objects, the biliary tree       was swept with a retrieval balloon. Sludge was swept from the duct. A       few stones were removed. No stones remained. An occlusion cholangiogram       was performed that showed no further significant biliary pathology.      The duodenoscope was withdrawn from the patient. Impression:               - The major papilla appeared to be small.                           - Failed cannulation in normal fashion. Double-wire                            technique unsuccessful. Pre-cut fistulotomy over                            pancreatic stent placement finally successful.                           - Pancreatic duct placed for Post-ERCP Pancreat                           - Filling defects consistent with stones and sludge                            were seen on the cholangiogram.                           - The upper third of the main bile duct and middle                            third of the main bile duct were moderately dilated.                           - Choledocholithiasis and significant biliary                            sludge were found. Complete removal was                            accomplished by biliary sphincterotomy and balloon                            sphincteroplasty and balloon trawl.. Recommendation:           - The patient will be observed post-procedure,  until all discharge criteria are met.                           - Return patient to hospital ward for ongoing care.                           - Check liver enzymes (AST, ALT, alkaline                            phosphatase, bilirubin) in the morning.                           - Start PPI 40 mg BID x 72-monththen decrease to 40                            mg QD x 151-monthnd if no issues then can come off                             completely thereafter.                           - Observe patient's clinical course.                           - Watch for pancreatitis, bleeding, perforation,                            and cholangitis.                           - Ideally would hold anticoagulation for 48-72                            hours, but if necessary, she can be monitored                            closely while restarting heparing in 10 hours                            (~1000PM tonight), though likely needs to hold                            heparin if she is going to have cholecystectomy.                           - Proceed with surgical intervention to gallbladder                            as able based on surgical colleagues timing,                            hopefuly interval during this admission.                           -  Patient will need a KUB 2-view in 10-14 days to                            ensure pancreatic stent has migrated successfully.                            If still present at that time will need to be                            scheduled for EGD with stent pull.                           - The findings and recommendations were discussed                            with the patient.                           - The findings and recommendations were discussed                            with the referring physician. Procedure Code(s):        --- Professional ---                           [phone], Endoscopic retrograde                            cholangiopancreatography (ERCP); with placement of                            endoscopic stent into biliary or pancreatic duct,                            including pre- and post-dilation and guide wire                            passage, when performed, including sphincterotomy,                            when performed, each stent                           [id number], Endoscopic retrograde                            cholangiopancreatography (ERCP); with  removal of                            calculi/debris from biliary/pancreatic duct(s) Diagnosis Code(s):        --- Professional ---                           K02.5, Other specified diseases of biliary tract  K80.50, Calculus of bile duct without cholangitis                            or cholecystitis without obstruction                           R94.5, Abnormal results of liver function studies                           R93.2, Abnormal findings on diagnostic imaging of                            liver and biliary tract CPT copyright 2019 American [employer] Association. All rights reserved. The codes documented in this report are preliminary and upon coder review may  be revised to meet current compliance requirements. Justice Britain, MD 07/21/2019 1:30:58 PM Number of Addenda: 0

## 2019-07-21 NOTE — Progress Notes (Signed)
Hospitalist daily note   [place] BQ:[id number] DOB: [birth year] DOA: 07/18/2019  PCP: Aura Dials, MD   Narrative:  32 WF A. fib [place] score >4/Eliquis bipolar, hypothyroid normal colonoscopy 2007 2013 admitted 12/1 choledocholithiasis abdominal pain  Data Reviewed:  Potassium 3.4 AST/ALT 190/3375 (on admission 476/542) bilirubin 2.9 White count 9.1 on admission 14 Ultrasound abdomen 12/1 cholelithiasis sludge as well asfatty infiltration MRCP 12/2 choledocholithiasis ERCP 12/4 failed cannulation fistulotomy over pancreatic stent performed choledocholithiasis + sludge removal-needs KUB 12/14 to document migration of stent Assessment & Plan: Choledocholelithiasis-ERCP done as above-General surgery consulted for choledocholithiasis and planning lap chole 12/5-start PPI X1 month-check lipase a.m., LFT and need AXR 12/14 documenting pancreatic stent migration--d/c Zosyn post-op? Atrial fibrillation-Eliquis 5 on hold and discontinue heparin until post-op as above Anxiety-resuming trazodone 50, Xanax increasing to 0.5 bid  Full code-spoke with sister on phone 12/4 and updated, inpatient, likely d/c 12/6  Subjective: Awake coherent a little loopy form anesthetic agent no distress No abd pain no n/v Eating ice cream and soup  Consultants:   Gi  gen surg  Antimicrobials:   Zosyn   Objective: Vitals:   07/21/19 1316 07/21/19 1327 07/21/19 1338 07/21/19 1400  BP: 140/75 (!) 154/76  126/71  Pulse: 82 80 81   Resp: 14 15 20 15   Temp: 98.7 F (37.1 C)     TempSrc: Temporal     SpO2: 96% 97% 95% 91%  Weight:      Height:        Intake/Output Summary (Last 24 hours) at 07/21/2019 1445 Last data filed at 07/21/2019 1304 Gross per 24 hour  Intake 1760 ml  Output 900 ml  Net 860 ml   Filed Weights   07/19/19 0000 07/21/19 0700 07/21/19 0957  Weight: 70.3 kg 76 kg 76 kg   Examination:  eomi ncat no ict no pallor s1 s2 no m/r/g abd soft no rebound Neuro intact to gorss  motor-perrla Slight drowsy but redirecable  Scheduled Meds: . ALPRAZolam  0.5 mg Oral QHS  . baclofen  20 mg Oral QHS  . naproxen  500 mg Oral BID WC  . sodium chloride flush  3 mL Intravenous Once  . traZODone  50 mg Oral QHS   Continuous Infusions: . piperacillin-tazobactam (ZOSYN)  IV 3.375 g (07/21/19 0825)     LOS: 3 days   Time spent: Martin Lake, MD [facility]

## 2019-07-21 NOTE — Discharge Instructions (Signed)
[facility]  LAPAROSCOPIC SURGERY: POST OP INSTRUCTIONS Always review your discharge instruction sheet given to you by the facility where your surgery was performed. IF YOU HAVE DISABILITY OR FAMILY LEAVE FORMS, YOU MUST BRING THEM TO THE OFFICE FOR PROCESSING.   DO NOT GIVE THEM TO YOUR DOCTOR.  PAIN CONTROL  1. First take acetaminophen (Tylenol) AND/or ibuprofen (Advil) to control your pain after surgery.  Follow directions on package.  Taking acetaminophen (Tylenol) and/or ibuprofen (Advil) regularly after surgery will help to control your pain and lower the amount of prescription pain medication you may need.  You should not take more than 4,000 mg (4 grams) of acetaminophen (Tylenol) in 24 hours.  You should not take ibuprofen (Advil), aleve, motrin, naprosyn or other NSAIDS if you have a history of stomach ulcers or chronic kidney disease.  2. A prescription for pain medication may be given to you upon discharge.  Take your pain medication as prescribed, if you still have uncontrolled pain after taking acetaminophen (Tylenol) or ibuprofen (Advil). 3. Use ice packs to help control pain. 4. If you need a refill on your pain medication, please contact your pharmacy.  They will contact our office to request authorization. Prescriptions will not be filled after 5pm or on week-ends.  HOME MEDICATIONS 5. Take your usually prescribed medications unless otherwise directed.  DIET 6. You should follow a light diet the first few days after arrival home.  Be sure to include lots of fluids daily. Avoid fatty, fried foods.   CONSTIPATION 7. It is common to experience some constipation after surgery and if you are taking pain medication.  Increasing fluid intake and taking a stool softener (such as Colace) will usually help or prevent this problem from occurring.  A mild laxative (Milk of Magnesia or Miralax) should be taken according to package instructions if there are no bowel  movements after 48 hours.  WOUND/INCISION CARE 8. Most patients will experience some swelling and bruising in the area of the incisions.  Ice packs will help.  Swelling and bruising can take several days to resolve.  9. Unless discharge instructions indicate otherwise, follow guidelines below  a. STERI-STRIPS - you may remove your outer bandages 48 hours after surgery, and you may shower at that time.  You have steri-strips (small skin tapes) in place directly over the incision.  These strips should be left on the skin for 7-10 days.   b. DERMABOND/SKIN GLUE - you may shower in 24 hours.  The glue will flake off over the next 2-3 weeks. 10. Any sutures or staples will be removed at the office during your follow-up visit.  ACTIVITIES 11. You may resume regular (light) daily activities beginning the next day--such as daily self-care, walking, climbing stairs--gradually increasing activities as tolerated.  You may have sexual intercourse when it is comfortable.  Refrain from any heavy lifting or straining until approved by your doctor. a. You may drive when you are no longer taking prescription pain medication, you can comfortably wear a seatbelt, and you can safely maneuver your car and apply brakes.  FOLLOW-UP 12. You should see your doctor in the office for a follow-up appointment approximately 2-3 weeks after your surgery.  You should have been given your post-op/follow-up appointment when your surgery was scheduled.  If you did not receive a post-op/follow-up appointment, make sure that you call for this appointment within a day or two after you arrive home to insure a convenient appointment time.  OTHER INSTRUCTIONS  WHEN TO CALL YOUR DOCTOR: 1. Fever over 101.0 2. Inability to urinate 3. Continued bleeding from incision. 4. Increased pain, redness, or drainage from the incision. 5. Increasing abdominal pain  The clinic staff is available to answer your questions during regular business  hours.  Please dont hesitate to call and ask to speak to one of the nurses for clinical concerns.  If you have a medical emergency, go to the nearest emergency room or call 911.  A surgeon from [facility] is always on call at the hospital. [address] [place], [place], [place]  [address] ? [address], [place], [place]   [address] [phone] ? [phone] ? FAX [phone]

## 2019-07-21 NOTE — Transfer of Care (Signed)
Immediate Anesthesia Transfer of Care Note  Patient: Carly Rhodes  Procedure(s) Performed: ENDOSCOPIC RETROGRADE CHOLANGIOPANCREATOGRAPHY (ERCP) (N/A ) SPHINCTEROTOMY REMOVAL OF STONES BILIARY DILATION PANCREATIC STENT PLACEMENT  Patient Location: Endoscopy Unit  Anesthesia Type:General  Level of Consciousness: awake, alert  and oriented  Airway & Oxygen Therapy: Patient Spontanous Breathing and Patient connected to face mask oxygen  Post-op Assessment: Report given to RN and Post -op Vital signs reviewed and stable  Post vital signs: Reviewed and stable  Last Vitals:  Vitals Value Taken Time  BP    Temp    Pulse    Resp    SpO2      Last Pain:  Vitals:   07/21/19 0957  TempSrc: Oral  PainSc: 0-No pain      Patients Stated Pain Goal: 0 ([id number])  Complications: No apparent anesthesia complications

## 2019-07-21 NOTE — H&P (View-Only) (Signed)
[facility] Consult Note  [place] 1945/11/03  [id number].    Requesting MD: Verneita Griffes Chief Complaint/Reason for Consult: choledocholithiasis  HPI:  Carly Rhodes is a 73yo female PMH anxiety and atrial fibrillation on eliquis (last dose 12/1), who was admitted to [facility] 12/1 with a few months of intermittent abdominal pain. Prior to admission she had worsening abdominal pain for 2 days. Pain was mostly epigastric and radiating into her back. Associated with nausea and vomiting. Denies fever, chills, diarrhea.  Patient was worked up by the ED and found to have WBC 12.6 and elevated LFTs with AST 476, ALT 542, AP 213, Tbili 2.9. MRCP was obtained and shows cholelithiasis and choledocholithiasis with mild extrahepatic biliary dilatation, no evidence of cholecystitis. GI was consulted and plans for ERCP today. General surgery asked to see for consideration of laparoscopic cholecystectomy.  Abdominal surgical history: none  ROS: Review of Systems  Constitutional: Negative.   HENT: Negative.   Eyes: Negative.   Respiratory: Negative.   Cardiovascular: Negative.   Gastrointestinal: Positive for abdominal pain, nausea and vomiting.  Genitourinary: Negative.   Musculoskeletal: Negative.   Skin:       jaundice  Neurological: Negative.     All systems reviewed and otherwise negative except for as above  Family History  Family history unknown: Yes    Past Medical History:  Diagnosis Date  . Anxiety   . Atrial fibrillation ([place])   . Dysrhythmia     History reviewed. No pertinent surgical history.  Social History:  reports that she has never smoked. She has never used smokeless tobacco. She reports that she does not drink alcohol. No history on file for drug.  Allergies: No Known Allergies  Medications Prior to Admission  Medication Sig Dispense Refill  . acetaminophen-codeine (TYLENOL #3) 300-30 MG tablet Take 2 tablets by mouth at bedtime.    . ALPRAZolam  (XANAX) 0.5 MG tablet Take 0.5 mg by mouth at bedtime.    . baclofen (LIORESAL) 10 MG tablet Take 20 mg by mouth at bedtime.    . cyanocobalamin (,VITAMIN B-12,) 1000 MCG/ML injection Inject 1 mL into the muscle every 30 (thirty) days.    Marland Kitchen ELIQUIS 5 MG TABS tablet Take 5 mg by mouth 2 (two) times daily.    Marland Kitchen ezetimibe (ZETIA) 10 MG tablet Take 10 mg by mouth daily.    . traZODone (DESYREL) 50 MG tablet Take 50 mg by mouth at bedtime.      Prior to Admission medications   Medication Sig Start Date End Date Taking? Authorizing Provider  acetaminophen-codeine (TYLENOL #3) 300-30 MG tablet Take 2 tablets by mouth at bedtime. 06/22/19  Yes [provider]  ALPRAZolam Duanne Moron) 0.5 MG tablet Take 0.5 mg by mouth at bedtime.   Yes [provider]  baclofen (LIORESAL) 10 MG tablet Take 20 mg by mouth at bedtime. 07/05/19  Yes [provider]  cyanocobalamin (,VITAMIN B-12,) 1000 MCG/ML injection Inject 1 mL into the muscle every 30 (thirty) days. 07/05/19  Yes [provider]  ELIQUIS 5 MG TABS tablet Take 5 mg by mouth 2 (two) times daily. 07/10/19  Yes [provider]  ezetimibe (ZETIA) 10 MG tablet Take 10 mg by mouth daily.   Yes [provider]  traZODone (DESYREL) 50 MG tablet Take 50 mg by mouth at bedtime.   Yes [provider]    Blood pressure 119/75, pulse 71, temperature 97.7 F (36.5 C), temperature source Oral, resp. rate 18, height  5\' 5"  (1.651 m), weight 76 kg, SpO2 95 %. Physical Exam: General: pleasant, WD/WN white female who is laying in bed in NAD HEENT: head is normocephalic, atraumatic.  Sclera are noninjected.  Pupils equal and round.  Ears and nose without any masses or lesions.  Mouth is pink and moist. Dentition fair Heart: regular, rate, and rhythm.  No obvious murmurs, gallops, or rubs noted.  Palpable pedal pulses bilaterally Lungs: CTAB, no wheezes, rhonchi, or rales noted.  Respiratory effort nonlabored Abd:  incisions, soft, NT/ND, +BS, no masses, hernias, or organomegaly MS: calves soft and nontender without edema Skin: warm and dry with no masses, lesions, or rashes Psych: A&Ox3 with an appropriate affect. Neuro: cranial nerves grossly intact, extremity CSM intact bilaterally, normal speech  Results for orders placed or performed during the hospital encounter of 07/18/19 (from the past 48 hour(s))  APTT     Status: Abnormal   Collection Time: 07/19/19  9:00 AM  Result Value Ref Range   aPTT 79 (H) 24 - 36 seconds    Comment:        IF BASELINE aPTT IS ELEVATED, SUGGEST PATIENT RISK ASSESSMENT BE USED TO DETERMINE APPROPRIATE ANTICOAGULANT THERAPY. Performed at [facility], [place] [address], [place], [place] [address]   Comprehensive metabolic panel     Status: Abnormal   Collection Time: 07/19/19  9:24 PM  Result Value Ref Range   Sodium 141 135 - 145 mmol/L   Potassium 3.4 (L) 3.5 - 5.1 mmol/L   Chloride 108 98 - 111 mmol/L   CO2 22 22 - 32 mmol/L   Glucose, Bld 96 70 - 99 mg/dL   BUN 10 8 - 23 mg/dL   Creatinine, Ser 0.91 0.44 - 1.00 mg/dL   Calcium 9.0 8.9 - 10.3 mg/dL   Total Protein 6.0 (L) 6.5 - 8.1 g/dL   Albumin 3.2 (L) 3.5 - 5.0 g/dL   AST 190 (H) 15 - 41 U/L   ALT 375 (H) 0 - 44 U/L   Alkaline Phosphatase 187 (H) 38 - 126 U/L   Total Bilirubin 2.7 (H) 0.3 - 1.2 mg/dL   GFR calc non Af Amer >60 >60 mL/min   GFR calc Af Amer >60 >60 mL/min   Anion gap 11 5 - 15    Comment: Performed at [facility], [place] [address] [place] [address], [place], [place] [address]  Glucose, capillary     Status: None   Collection Time: 07/19/19  9:24 PM  Result Value Ref Range   Glucose-Capillary 84 70 - 99 mg/dL  Heparin level (unfractionated)     Status: None   Collection Time: 07/20/19  2:56 AM  Result Value Ref Range   Heparin Unfractionated 0.58 0.30 - 0.70 IU/mL    Comment: (NOTE) If heparin results are below expected values, and patient dosage has  been confirmed, suggest follow up  testing of antithrombin III levels. Performed at [facility], [place] [address], [place], [place] [address]   APTT     Status: Abnormal   Collection Time: 07/20/19  2:56 AM  Result Value Ref Range   aPTT 81 (H) 24 - 36 seconds    Comment:        IF BASELINE aPTT IS ELEVATED, SUGGEST PATIENT RISK ASSESSMENT BE USED TO DETERMINE APPROPRIATE ANTICOAGULANT THERAPY. Performed at [facility], [place] [address], [place], [place] [address]   CBC     Status: None   Collection Time: 07/20/19  2:56  AM  Result Value Ref Range   WBC 9.1 4.0 - 10.5 K/uL   RBC 4.39 3.87 - 5.11 MIL/uL   Hemoglobin 12.6 12.0 - 15.0 g/dL   HCT 39.6 36.0 - 46.0 %   MCV 90.2 80.0 - 100.0 fL   MCH 28.7 26.0 - 34.0 pg   MCHC 31.8 30.0 - 36.0 g/dL   RDW 14.5 11.5 - 15.5 %   Platelets 166 150 - 400 K/uL   nRBC 0.0 0.0 - 0.2 %    Comment: Performed at [facility], [place] [address], [place], [place] [address]  Glucose, capillary     Status: Abnormal   Collection Time: 07/20/19  8:18 AM  Result Value Ref Range   Glucose-Capillary 55 (L) 70 - 99 mg/dL  Glucose, capillary     Status: None   Collection Time: 07/20/19  8:52 AM  Result Value Ref Range   Glucose-Capillary 95 70 - 99 mg/dL  Glucose, capillary     Status: None   Collection Time: 07/20/19  4:43 PM  Result Value Ref Range   Glucose-Capillary 86 70 - 99 mg/dL  Glucose, capillary     Status: None   Collection Time: 07/21/19  4:55 AM  Result Value Ref Range   Glucose-Capillary 95 70 - 99 mg/dL  Heparin level (unfractionated)     Status: None   Collection Time: 07/21/19  6:00 AM  Result Value Ref Range   Heparin Unfractionated 0.47 0.30 - 0.70 IU/mL    Comment: (NOTE) If heparin results are below expected values, and patient dosage has  been confirmed, suggest follow up testing of antithrombin III levels. Performed at [facility], [place] [address], [place], [place] [address]   APTT     Status: Abnormal   Collection Time: 07/21/19   6:00 AM  Result Value Ref Range   aPTT 58 (H) 24 - 36 seconds    Comment:        IF BASELINE aPTT IS ELEVATED, SUGGEST PATIENT RISK ASSESSMENT BE USED TO DETERMINE APPROPRIATE ANTICOAGULANT THERAPY. Performed at [facility], [place] [address], [place], [place] [address]   CBC     Status: Abnormal   Collection Time: 07/21/19  6:00 AM  Result Value Ref Range   WBC 5.3 4.0 - 10.5 K/uL   RBC 4.12 3.87 - 5.11 MIL/uL   Hemoglobin 11.9 (L) 12.0 - 15.0 g/dL   HCT 36.0 36.0 - 46.0 %   MCV 87.4 80.0 - 100.0 fL   MCH 28.9 26.0 - 34.0 pg   MCHC 33.1 30.0 - 36.0 g/dL   RDW 13.9 11.5 - 15.5 %   Platelets 188 150 - 400 K/uL   nRBC 0.0 0.0 - 0.2 %    Comment: Performed at [facility], [place] [address] [place], [place] [address]  Hepatic function panel Once     Status: Abnormal   Collection Time: 07/21/19  6:00 AM  Result Value Ref Range   Total Protein 5.4 (L) 6.5 - 8.1 g/dL   Albumin 3.0 (L) 3.5 - 5.0 g/dL   AST 114 (H) 15 - 41 U/L   ALT 249 (H) 0 - 44 U/L   Alkaline Phosphatase 172 (H) 38 - 126 U/L   Total Bilirubin 1.4 (H) 0.3 - 1.2 mg/dL   Bilirubin, Direct 0.6 (H) 0.0 - 0.2 mg/dL   Indirect Bilirubin 0.8 0.3 - 0.9 mg/dL    Comment: Performed at [facility]  [address], [place], [place] [address]   No results found.  Anti-infectives (From admission, onward)   Start     Dose/Rate Route Frequency Ordered Stop   07/19/19 0800  piperacillin-tazobactam (ZOSYN) IVPB 3.375 g     3.375 g 12.5 mL/hr over 240 Minutes Intravenous Every 8 hours 07/19/19 0008     07/19/19 0015  piperacillin-tazobactam (ZOSYN) IVPB 3.375 g     3.375 g 100 mL/hr over 30 Minutes Intravenous STAT 07/19/19 0008 07/19/19 0136       Assessment/Plan Anxiety  Atrial fibrillation on eliquis (last dose 12/1)  Choledocholithiasis - Patient is going for ERCP today. Plan for laparoscopic cholecystectomy tomorrow. She will need to be NPO after midnight and hold IV heparin tomorrow morning at  0500.  ID - zosyn 12/2>> VTE - SCDs, heparin gtt FEN - IVF, NPO Foley - none Follow up - TBD   [place], [facility] Surgery 07/21/2019, 8:18 AM Please see Amion for pager number during day hours 7:00am-4:30pm

## 2019-07-21 NOTE — Consult Note (Addendum)
[facility] Consult Note  [place] 06/25/1946  [id number].    Requesting MD: Verneita Griffes Chief Complaint/Reason for Consult: choledocholithiasis  HPI:  Carly Rhodes is a 73yo female PMH anxiety and atrial fibrillation on eliquis (last dose 12/1), who was admitted to [facility] 12/1 with a few months of intermittent abdominal pain. Prior to admission she had worsening abdominal pain for 2 days. Pain was mostly epigastric and radiating into her back. Associated with nausea and vomiting. Denies fever, chills, diarrhea.  Patient was worked up by the ED and found to have WBC 12.6 and elevated LFTs with AST 476, ALT 542, AP 213, Tbili 2.9. MRCP was obtained and shows cholelithiasis and choledocholithiasis with mild extrahepatic biliary dilatation, no evidence of cholecystitis. GI was consulted and plans for ERCP today. General surgery asked to see for consideration of laparoscopic cholecystectomy.  Abdominal surgical history: none  ROS: Review of Systems  Constitutional: Negative.   HENT: Negative.   Eyes: Negative.   Respiratory: Negative.   Cardiovascular: Negative.   Gastrointestinal: Positive for abdominal pain, nausea and vomiting.  Genitourinary: Negative.   Musculoskeletal: Negative.   Skin:       jaundice  Neurological: Negative.     All systems reviewed and otherwise negative except for as above  Family History  Family history unknown: Yes    Past Medical History:  Diagnosis Date  . Anxiety   . Atrial fibrillation ([place])   . Dysrhythmia     History reviewed. No pertinent surgical history.  Social History:  reports that she has never smoked. She has never used smokeless tobacco. She reports that she does not drink alcohol. No history on file for drug.  Allergies: No Known Allergies  Medications Prior to Admission  Medication Sig Dispense Refill  . acetaminophen-codeine (TYLENOL #3) 300-30 MG tablet Take 2 tablets by mouth at bedtime.    . ALPRAZolam  (XANAX) 0.5 MG tablet Take 0.5 mg by mouth at bedtime.    . baclofen (LIORESAL) 10 MG tablet Take 20 mg by mouth at bedtime.    . cyanocobalamin (,VITAMIN B-12,) 1000 MCG/ML injection Inject 1 mL into the muscle every 30 (thirty) days.    Marland Kitchen ELIQUIS 5 MG TABS tablet Take 5 mg by mouth 2 (two) times daily.    Marland Kitchen ezetimibe (ZETIA) 10 MG tablet Take 10 mg by mouth daily.    . traZODone (DESYREL) 50 MG tablet Take 50 mg by mouth at bedtime.      Prior to Admission medications   Medication Sig Start Date End Date Taking? Authorizing Provider  acetaminophen-codeine (TYLENOL #3) 300-30 MG tablet Take 2 tablets by mouth at bedtime. 06/22/19  Yes [provider]  ALPRAZolam Duanne Moron) 0.5 MG tablet Take 0.5 mg by mouth at bedtime.   Yes [provider]  baclofen (LIORESAL) 10 MG tablet Take 20 mg by mouth at bedtime. 07/05/19  Yes [provider]  cyanocobalamin (,VITAMIN B-12,) 1000 MCG/ML injection Inject 1 mL into the muscle every 30 (thirty) days. 07/05/19  Yes [provider]  ELIQUIS 5 MG TABS tablet Take 5 mg by mouth 2 (two) times daily. 07/10/19  Yes [provider]  ezetimibe (ZETIA) 10 MG tablet Take 10 mg by mouth daily.   Yes [provider]  traZODone (DESYREL) 50 MG tablet Take 50 mg by mouth at bedtime.   Yes [provider]    Blood pressure 119/75, pulse 71, temperature 97.7 F (36.5 C), temperature source Oral, resp. rate 18, height  5\' 5"  (1.651 m), weight 76 kg, SpO2 95 %. Physical Exam: General: pleasant, WD/WN white female who is laying in bed in NAD HEENT: head is normocephalic, atraumatic.  Sclera are noninjected.  Pupils equal and round.  Ears and nose without any masses or lesions.  Mouth is pink and moist. Dentition fair Heart: regular, rate, and rhythm.  No obvious murmurs, gallops, or rubs noted.  Palpable pedal pulses bilaterally Lungs: CTAB, no wheezes, rhonchi, or rales noted.  Respiratory effort nonlabored Abd:  incisions, soft, NT/ND, +BS, no masses, hernias, or organomegaly MS: calves soft and nontender without edema Skin: warm and dry with no masses, lesions, or rashes Psych: A&Ox3 with an appropriate affect. Neuro: cranial nerves grossly intact, extremity CSM intact bilaterally, normal speech  Results for orders placed or performed during the hospital encounter of 07/18/19 (from the past 48 hour(s))  APTT     Status: Abnormal   Collection Time: 07/19/19  9:00 AM  Result Value Ref Range   aPTT 79 (H) 24 - 36 seconds    Comment:        IF BASELINE aPTT IS ELEVATED, SUGGEST PATIENT RISK ASSESSMENT BE USED TO DETERMINE APPROPRIATE ANTICOAGULANT THERAPY. Performed at [facility], [place] [address], [place], [place] [address]   Comprehensive metabolic panel     Status: Abnormal   Collection Time: 07/19/19  9:24 PM  Result Value Ref Range   Sodium 141 135 - 145 mmol/L   Potassium 3.4 (L) 3.5 - 5.1 mmol/L   Chloride 108 98 - 111 mmol/L   CO2 22 22 - 32 mmol/L   Glucose, Bld 96 70 - 99 mg/dL   BUN 10 8 - 23 mg/dL   Creatinine, Ser 0.91 0.44 - 1.00 mg/dL   Calcium 9.0 8.9 - 10.3 mg/dL   Total Protein 6.0 (L) 6.5 - 8.1 g/dL   Albumin 3.2 (L) 3.5 - 5.0 g/dL   AST 190 (H) 15 - 41 U/L   ALT 375 (H) 0 - 44 U/L   Alkaline Phosphatase 187 (H) 38 - 126 U/L   Total Bilirubin 2.7 (H) 0.3 - 1.2 mg/dL   GFR calc non Af Amer >60 >60 mL/min   GFR calc Af Amer >60 >60 mL/min   Anion gap 11 5 - 15    Comment: Performed at [facility], [place] [address], [place], [place] [address]  Glucose, capillary     Status: None   Collection Time: 07/19/19  9:24 PM  Result Value Ref Range   Glucose-Capillary 84 70 - 99 mg/dL  Heparin level (unfractionated)     Status: None   Collection Time: 07/20/19  2:56 AM  Result Value Ref Range   Heparin Unfractionated 0.58 0.30 - 0.70 IU/mL    Comment: (NOTE) If heparin results are below expected values, and patient dosage has  been confirmed, suggest follow up  testing of antithrombin III levels. Performed at [facility], [place] [address] [place], [place] [address]   APTT     Status: Abnormal   Collection Time: 07/20/19  2:56 AM  Result Value Ref Range   aPTT 81 (H) 24 - 36 seconds    Comment:        IF BASELINE aPTT IS ELEVATED, SUGGEST PATIENT RISK ASSESSMENT BE USED TO DETERMINE APPROPRIATE ANTICOAGULANT THERAPY. Performed at [facility], [place] [address], [place], [place] [address]   CBC     Status: None   Collection Time: 07/20/19  2:56  AM  Result Value Ref Range   WBC 9.1 4.0 - 10.5 K/uL   RBC 4.39 3.87 - 5.11 MIL/uL   Hemoglobin 12.6 12.0 - 15.0 g/dL   HCT 39.6 36.0 - 46.0 %   MCV 90.2 80.0 - 100.0 fL   MCH 28.7 26.0 - 34.0 pg   MCHC 31.8 30.0 - 36.0 g/dL   RDW 14.5 11.5 - 15.5 %   Platelets 166 150 - 400 K/uL   nRBC 0.0 0.0 - 0.2 %    Comment: Performed at [facility], [place] [address], [place], [place] [address]  Glucose, capillary     Status: Abnormal   Collection Time: 07/20/19  8:18 AM  Result Value Ref Range   Glucose-Capillary 55 (L) 70 - 99 mg/dL  Glucose, capillary     Status: None   Collection Time: 07/20/19  8:52 AM  Result Value Ref Range   Glucose-Capillary 95 70 - 99 mg/dL  Glucose, capillary     Status: None   Collection Time: 07/20/19  4:43 PM  Result Value Ref Range   Glucose-Capillary 86 70 - 99 mg/dL  Glucose, capillary     Status: None   Collection Time: 07/21/19  4:55 AM  Result Value Ref Range   Glucose-Capillary 95 70 - 99 mg/dL  Heparin level (unfractionated)     Status: None   Collection Time: 07/21/19  6:00 AM  Result Value Ref Range   Heparin Unfractionated 0.47 0.30 - 0.70 IU/mL    Comment: (NOTE) If heparin results are below expected values, and patient dosage has  been confirmed, suggest follow up testing of antithrombin III levels. Performed at [facility], [place] [address], [place], [place] [address]   APTT     Status: Abnormal   Collection Time: 07/21/19   6:00 AM  Result Value Ref Range   aPTT 58 (H) 24 - 36 seconds    Comment:        IF BASELINE aPTT IS ELEVATED, SUGGEST PATIENT RISK ASSESSMENT BE USED TO DETERMINE APPROPRIATE ANTICOAGULANT THERAPY. Performed at [facility], [place] [address] [place], [place] [address]   CBC     Status: Abnormal   Collection Time: 07/21/19  6:00 AM  Result Value Ref Range   WBC 5.3 4.0 - 10.5 K/uL   RBC 4.12 3.87 - 5.11 MIL/uL   Hemoglobin 11.9 (L) 12.0 - 15.0 g/dL   HCT 36.0 36.0 - 46.0 %   MCV 87.4 80.0 - 100.0 fL   MCH 28.9 26.0 - 34.0 pg   MCHC 33.1 30.0 - 36.0 g/dL   RDW 13.9 11.5 - 15.5 %   Platelets 188 150 - 400 K/uL   nRBC 0.0 0.0 - 0.2 %    Comment: Performed at [facility], [place] [address], [place], [place] [address]  Hepatic function panel Once     Status: Abnormal   Collection Time: 07/21/19  6:00 AM  Result Value Ref Range   Total Protein 5.4 (L) 6.5 - 8.1 g/dL   Albumin 3.0 (L) 3.5 - 5.0 g/dL   AST 114 (H) 15 - 41 U/L   ALT 249 (H) 0 - 44 U/L   Alkaline Phosphatase 172 (H) 38 - 126 U/L   Total Bilirubin 1.4 (H) 0.3 - 1.2 mg/dL   Bilirubin, Direct 0.6 (H) 0.0 - 0.2 mg/dL   Indirect Bilirubin 0.8 0.3 - 0.9 mg/dL    Comment: Performed at [facility]  [address] [place], [place] [address]   No results found.  Anti-infectives (From admission, onward)   Start     Dose/Rate Route Frequency Ordered Stop   07/19/19 0800  piperacillin-tazobactam (ZOSYN) IVPB 3.375 g     3.375 g 12.5 mL/hr over 240 Minutes Intravenous Every 8 hours 07/19/19 0008     07/19/19 0015  piperacillin-tazobactam (ZOSYN) IVPB 3.375 g     3.375 g 100 mL/hr over 30 Minutes Intravenous STAT 07/19/19 0008 07/19/19 0136       Assessment/Plan Anxiety  Atrial fibrillation on eliquis (last dose 12/1)  Choledocholithiasis - Patient is going for ERCP today. Plan for laparoscopic cholecystectomy tomorrow. She will need to be NPO after midnight and hold IV heparin tomorrow morning at  0500.  ID - zosyn 12/2>> VTE - SCDs, heparin gtt FEN - IVF, NPO Foley - none Follow up - TBD   [place], [facility] Surgery 07/21/2019, 8:18 AM Please see Amion for pager number during day hours 7:00am-4:30pm

## 2019-07-21 NOTE — Anesthesia Procedure Notes (Signed)
Procedure Name: Intubation Date/Time: 07/21/2019 11:00 AM Performed by: Genelle Bal, CRNA Pre-anesthesia Checklist: Patient identified, Emergency Drugs available, Suction available and Patient being monitored Patient Re-evaluated:Patient Re-evaluated prior to induction Oxygen Delivery Method: Circle system utilized Preoxygenation: Pre-oxygenation with 100% oxygen Induction Type: IV induction Ventilation: Mask ventilation without difficulty Laryngoscope Size: Miller and 2 Grade View: Grade I Tube type: Oral Tube size: 7.0 mm Number of attempts: 1 Airway Equipment and Method: Stylet and Oral airway Placement Confirmation: ETT inserted through vocal cords under direct vision,  positive ETCO2 and breath sounds checked- equal and bilateral Secured at: 21 cm Tube secured with: Tape Dental Injury: Teeth and Oropharynx as per pre-operative assessment

## 2019-07-22 ENCOUNTER — Inpatient Hospital Stay (HOSPITAL_COMMUNITY): Payer: PPO | Admitting: Anesthesiology

## 2019-07-22 ENCOUNTER — Encounter (HOSPITAL_COMMUNITY)
Admission: EM | Disposition: A | Discharge status: Home / Self Care | Payer: Self-pay | Source: Home / Self Care | Attending: Family Medicine

## 2019-07-22 ENCOUNTER — Encounter (HOSPITAL_COMMUNITY): Payer: Self-pay | Admitting: Anesthesiology

## 2019-07-22 DIAGNOSIS — K8050 Calculus of bile duct without cholangitis or cholecystitis without obstruction: Secondary | ICD-10-CM

## 2019-07-22 HISTORY — PX: CHOLECYSTECTOMY: SHX55

## 2019-07-22 LAB — COMPREHENSIVE METABOLIC PANEL
ALT: 194 U/L — ABNORMAL HIGH (ref 0–44)
ALT: 220 U/L — ABNORMAL HIGH (ref 0–44)
AST: 55 U/L — ABNORMAL HIGH (ref 15–41)
AST: 98 U/L — ABNORMAL HIGH (ref 15–41)
Albumin: 3.1 g/dL — ABNORMAL LOW (ref 3.5–5.0)
Albumin: 3.3 g/dL — ABNORMAL LOW (ref 3.5–5.0)
Alkaline Phosphatase: 160 U/L — ABNORMAL HIGH (ref 38–126)
Alkaline Phosphatase: 168 U/L — ABNORMAL HIGH (ref 38–126)
Anion gap: 11 (ref 5–15)
Anion gap: 10 (ref 5–15)
BUN: 7 mg/dL — ABNORMAL LOW (ref 8–23)
BUN: 6 mg/dL — ABNORMAL LOW (ref 8–23)
CO2: 22 mmol/L (ref 22–32)
CO2: 23 mmol/L (ref 22–32)
Calcium: 8.9 mg/dL (ref 8.9–10.3)
Calcium: 9.1 mg/dL (ref 8.9–10.3)
Chloride: 108 mmol/L (ref 98–111)
Chloride: 106 mmol/L (ref 98–111)
Creatinine, Ser: 0.65 mg/dL (ref 0.44–1.00)
Creatinine, Ser: 0.74 mg/dL (ref 0.44–1.00)
GFR calc Af Amer: >60 mL/min (ref >60)
GFR calc Af Amer: >60 mL/min (ref >60)
GFR calc non Af Amer: >60 mL/min (ref >60)
GFR calc non Af Amer: >60 mL/min (ref >60)
Glucose, Bld: 107 mg/dL — ABNORMAL HIGH (ref 70–99)
Glucose, Bld: 128 mg/dL — ABNORMAL HIGH (ref 70–99)
Potassium: 3.4 mmol/L — ABNORMAL LOW (ref 3.5–5.1)
Potassium: 3.5 mmol/L (ref 3.5–5.1)
Sodium: 141 mmol/L (ref 135–145)
Sodium: 139 mmol/L (ref 135–145)
Total Bilirubin: 1.1 mg/dL (ref 0.3–1.2)
Total Bilirubin: 1.1 mg/dL (ref 0.3–1.2)
Total Protein: 5.6 g/dL — ABNORMAL LOW (ref 6.5–8.1)
Total Protein: 6.1 g/dL — ABNORMAL LOW (ref 6.5–8.1)

## 2019-07-22 LAB — GLUCOSE, CAPILLARY
Glucose-Capillary: 115 mg/dL — ABNORMAL HIGH (ref 70–99)
Glucose-Capillary: 81 mg/dL (ref 70–99)
Glucose-Capillary: 127 mg/dL — ABNORMAL HIGH (ref 70–99)
Glucose-Capillary: 116 mg/dL — ABNORMAL HIGH (ref 70–99)

## 2019-07-22 LAB — CBC
HCT: 36.5 % (ref 36.0–46.0)
Hemoglobin: 11.9 g/dL — ABNORMAL LOW (ref 12.0–15.0)
MCH: 28.5 pg (ref 26.0–34.0)
MCHC: 32.6 g/dL (ref 30.0–36.0)
MCV: 87.5 fL (ref 80.0–100.0)
Platelets: 196 10*3/uL (ref 150–400)
RBC: 4.17 MIL/uL (ref 3.87–5.11)
RDW: 13.9 % (ref 11.5–15.5)
WBC: 6.3 10*3/uL (ref 4.0–10.5)
nRBC: 0 % (ref 0.0–0.2)

## 2019-07-22 LAB — HEPARIN LEVEL (UNFRACTIONATED): Heparin Unfractionated: <0.1 IU/mL — ABNORMAL LOW (ref 0.30–0.70)

## 2019-07-22 LAB — LIPASE, BLOOD: Lipase: 103 U/L — ABNORMAL HIGH (ref 11–51)

## 2019-07-22 SURGERY — LAPAROSCOPIC CHOLECYSTECTOMY
Anesthesia: General | Site: Abdomen

## 2019-07-22 MED ORDER — 0.9 % SODIUM CHLORIDE (POUR BTL) OPTIME
TOPICAL | Status: DC | PRN
  Administered 2019-07-22: 1000 mL

## 2019-07-22 MED ORDER — BUPIVACAINE-EPINEPHRINE 0.25% -1:200000 IJ SOLN
INTRAMUSCULAR | Status: DC | PRN
  Administered 2019-07-22: 10 mL

## 2019-07-22 MED ORDER — ARTIFICIAL TEARS OPHTHALMIC OINT
TOPICAL_OINTMENT | OPHTHALMIC | Status: AC
  Filled 2019-07-22: qty 3.5

## 2019-07-22 MED ORDER — BUPIVACAINE HCL (PF) 0.25 % IJ SOLN
INTRAMUSCULAR | Status: AC
  Filled 2019-07-22: qty 30

## 2019-07-22 MED ORDER — HEPARIN (PORCINE) 25000 UT/250ML-% IV SOLN
1000.0000 [IU]/h | INTRAVENOUS | Status: DC
  Administered 2019-07-22: 1000 [IU]/h via INTRAVENOUS
  Filled 2019-07-22: qty 250

## 2019-07-22 MED ORDER — ROCURONIUM BROMIDE 10 MG/ML (PF) SYRINGE
PREFILLED_SYRINGE | INTRAVENOUS | Status: DC | PRN
  Administered 2019-07-22: 50 mg via INTRAVENOUS

## 2019-07-22 MED ORDER — PROPOFOL 10 MG/ML IV BOLUS
INTRAVENOUS | Status: DC | PRN
  Administered 2019-07-22: 110 mg via INTRAVENOUS

## 2019-07-22 MED ORDER — ACETAMINOPHEN 500 MG PO TABS
1000.0000 mg | ORAL_TABLET | ORAL | Status: DC | PRN
  Administered 2019-07-22 – 2019-07-23 (×4): 1000 mg via ORAL
  Filled 2019-07-22 (×4): qty 2

## 2019-07-22 MED ORDER — HEMOSTATIC AGENTS (NO CHARGE) OPTIME
TOPICAL | Status: DC | PRN
  Administered 2019-07-22: 1 via TOPICAL

## 2019-07-22 MED ORDER — ROCURONIUM BROMIDE 10 MG/ML (PF) SYRINGE
PREFILLED_SYRINGE | INTRAVENOUS | Status: AC
  Filled 2019-07-22: qty 10

## 2019-07-22 MED ORDER — FENTANYL CITRATE (PF) 100 MCG/2ML IJ SOLN
INTRAMUSCULAR | Status: DC | PRN
  Administered 2019-07-22 (×2): 50 ug via INTRAVENOUS
  Administered 2019-07-22: 100 ug via INTRAVENOUS
  Administered 2019-07-22: 50 ug via INTRAVENOUS

## 2019-07-22 MED ORDER — FENTANYL CITRATE (PF) 100 MCG/2ML IJ SOLN: 25.0000 ug | INTRAMUSCULAR | Status: DC | PRN

## 2019-07-22 MED ORDER — TRAMADOL HCL 50 MG PO TABS: 50.0000 mg | ORAL_TABLET | Freq: Four times a day (QID) | ORAL | Status: DC | PRN

## 2019-07-22 MED ORDER — SUGAMMADEX SODIUM 200 MG/2ML IV SOLN
INTRAVENOUS | Status: DC | PRN
  Administered 2019-07-22: 200 mg via INTRAVENOUS

## 2019-07-22 MED ORDER — OXYCODONE HCL 5 MG PO TABS
5.0000 mg | ORAL_TABLET | Freq: Once | ORAL | Status: AC | PRN
  Administered 2019-07-22: 5 mg via ORAL

## 2019-07-22 MED ORDER — DEXAMETHASONE SODIUM PHOSPHATE 10 MG/ML IJ SOLN
INTRAMUSCULAR | Status: DC | PRN
  Administered 2019-07-22: 10 mg via INTRAVENOUS

## 2019-07-22 MED ORDER — FENTANYL CITRATE (PF) 250 MCG/5ML IJ SOLN
INTRAMUSCULAR | Status: AC
  Filled 2019-07-22: qty 5

## 2019-07-22 MED ORDER — ONDANSETRON HCL 4 MG/2ML IJ SOLN
INTRAMUSCULAR | Status: DC | PRN
  Administered 2019-07-22: 4 mg via INTRAVENOUS

## 2019-07-22 MED ORDER — OXYCODONE HCL 5 MG PO TABS
ORAL_TABLET | ORAL | Status: AC
  Filled 2019-07-22: qty 1

## 2019-07-22 MED ORDER — LACTATED RINGERS IV SOLN
INTRAVENOUS | Status: DC | PRN
  Administered 2019-07-22: 10:00:00 via INTRAVENOUS

## 2019-07-22 MED ORDER — SODIUM CHLORIDE 0.9 % IR SOLN
Status: DC | PRN
  Administered 2019-07-22: 1000 mL

## 2019-07-22 MED ORDER — OXYCODONE HCL 5 MG/5ML PO SOLN: 5.0000 mg | Freq: Once | ORAL | Status: AC | PRN

## 2019-07-22 MED ORDER — PROPOFOL 10 MG/ML IV BOLUS
INTRAVENOUS | Status: AC
  Filled 2019-07-22: qty 20

## 2019-07-22 MED ORDER — LIDOCAINE 2% (20 MG/ML) 5 ML SYRINGE
INTRAMUSCULAR | Status: DC | PRN
  Administered 2019-07-22: 60 mg via INTRAVENOUS

## 2019-07-22 MED ORDER — ONDANSETRON HCL 4 MG/2ML IJ SOLN
INTRAMUSCULAR | Status: AC
  Filled 2019-07-22: qty 2

## 2019-07-22 SURGICAL SUPPLY — 36 items
APPLIER CLIP 5 13 M/L LIGAMAX5 (MISCELLANEOUS) ×3
BLADE CLIPPER SURG (BLADE) IMPLANT
CANISTER SUCT 3000ML PPV (MISCELLANEOUS) ×3 IMPLANT
CHLORAPREP W/TINT 26 (MISCELLANEOUS) ×3 IMPLANT
CLIP APPLIE 5 13 M/L LIGAMAX5 (MISCELLANEOUS) ×1 IMPLANT
COVER SURGICAL LIGHT HANDLE (MISCELLANEOUS) ×3 IMPLANT
COVER WAND RF STERILE (DRAPES) ×3 IMPLANT
DERMABOND ADVANCED (GAUZE/BANDAGES/DRESSINGS) ×2
DERMABOND ADVANCED .7 DNX12 (GAUZE/BANDAGES/DRESSINGS) ×1 IMPLANT
ELECT REM PT RETURN 9FT ADLT (ELECTROSURGICAL) ×3
ELECTRODE REM PT RTRN 9FT ADLT (ELECTROSURGICAL) ×1 IMPLANT
GLOVE BIO SURGEON STRL SZ 6 (GLOVE) ×3 IMPLANT
GLOVE INDICATOR 6.5 STRL GRN (GLOVE) ×3 IMPLANT
GOWN STRL REUS W/ TWL LRG LVL3 (GOWN DISPOSABLE) ×3 IMPLANT
GOWN STRL REUS W/TWL LRG LVL3 (GOWN DISPOSABLE) ×6
GRASPER SUT TROCAR 14GX15 (MISCELLANEOUS) ×3 IMPLANT
HEMOSTAT SNOW SURGICEL 2X4 (HEMOSTASIS) ×2 IMPLANT
KIT BASIN OR (CUSTOM PROCEDURE TRAY) ×3 IMPLANT
KIT TURNOVER KIT B (KITS) ×3 IMPLANT
NDL INSUFFLATION 14GA 120MM (NEEDLE) ×1 IMPLANT
NEEDLE INSUFFLATION 14GA 120MM (NEEDLE) ×3 IMPLANT
NS IRRIG 1000ML POUR BTL (IV SOLUTION) ×3 IMPLANT
PAD ARMBOARD 7.5X6 YLW CONV (MISCELLANEOUS) ×3 IMPLANT
POUCH SPECIMEN RETRIEVAL 10MM (ENDOMECHANICALS) ×3 IMPLANT
SCISSORS LAP 5X35 DISP (ENDOMECHANICALS) ×3 IMPLANT
SET IRRIG TUBING LAPAROSCOPIC (IRRIGATION / IRRIGATOR) ×3 IMPLANT
SET TUBE SMOKE EVAC HIGH FLOW (TUBING) ×3 IMPLANT
SLEEVE ENDOPATH XCEL 5M (ENDOMECHANICALS) ×6 IMPLANT
SPECIMEN JAR SMALL (MISCELLANEOUS) ×3 IMPLANT
SUT MNCRL AB 4-0 PS2 18 (SUTURE) ×3 IMPLANT
TOWEL GREEN STERILE (TOWEL DISPOSABLE) IMPLANT
TOWEL GREEN STERILE FF (TOWEL DISPOSABLE) ×3 IMPLANT
TRAY LAPAROSCOPIC MC (CUSTOM PROCEDURE TRAY) ×3 IMPLANT
TROCAR XCEL NON-BLD 11X100MML (ENDOMECHANICALS) ×3 IMPLANT
TROCAR XCEL NON-BLD 5MMX100MML (ENDOMECHANICALS) ×3 IMPLANT
WATER STERILE IRR 1000ML POUR (IV SOLUTION) ×3 IMPLANT

## 2019-07-22 NOTE — Interval H&P Note (Signed)
History and Physical Interval Note:  07/22/2019 7:46 AM  [place]  has presented today for surgery, with the diagnosis of CHOLEDOCHOLITHIASIS.  The various methods of treatment have been discussed with the patient and family. After consideration of risks, benefits and other options for treatment, the patient has consented to  Procedure(s): LAPAROSCOPIC CHOLECYSTECTOMY (N/A) as a surgical intervention.  The patient's history has been reviewed, patient examined, no change in status, stable for surgery.  I have reviewed the patient's chart and labs.  Questions were answered to the patient's satisfaction.     Nehemiah Mcfarren Rich Brave

## 2019-07-22 NOTE — Anesthesia Procedure Notes (Signed)
Procedure Name: Intubation Date/Time: 07/22/2019 9:57 AM Performed by: Kyung Rudd, CRNA Pre-anesthesia Checklist: Patient identified, Emergency Drugs available, Suction available and Patient being monitored Patient Re-evaluated:Patient Re-evaluated prior to induction Oxygen Delivery Method: Circle system utilized Preoxygenation: Pre-oxygenation with 100% oxygen Induction Type: IV induction Ventilation: Mask ventilation without difficulty Laryngoscope Size: Mac and 3 Grade View: Grade II Tube type: Oral Tube size: 7.0 mm Number of attempts: 1 Airway Equipment and Method: Stylet Placement Confirmation: ETT inserted through vocal cords under direct vision,  positive ETCO2 and breath sounds checked- equal and bilateral Secured at: 21 cm Tube secured with: Tape Dental Injury: Teeth and Oropharynx as per pre-operative assessment

## 2019-07-22 NOTE — Progress Notes (Signed)
Progress Note   Subjective  Patient doing well, s/p ERCP. No pain, feels comfortable, anxious to get her gallbladder out today. Hungry and wants to eat.    Objective   Vital signs in last 24 hours: Temp:  [97.8 F (36.6 C)-98.7 F (37.1 C)] 97.8 F (36.6 C) (12/05 0315) Pulse Rate:  [60-82] 78 (12/05 0315) Resp:  [14-20] 20 (12/05 0315) BP: (119-154)/(58-76) 120/62 (12/05 0315) SpO2:  [91 %-99 %] 95 % (12/05 0315) Weight:  [76 kg] 76 kg (12/05 0504) Last BM Date: 07/18/19 General:    white female in NAD Heart:  Regular rate and rhythm; no murmurs Lungs: Respirations even and unlabored, lungs CTA bilaterally Abdomen:  Soft, nontender and nondistended.  Extremities:  Without edema. Neurologic:  Alert and oriented,  grossly normal neurologically. Psych:  Cooperative. Normal mood and affect.  Intake/Output from previous day: 12/04 0701 - 12/05 0700 In: 900 [I.V.:900] Out: 0  Intake/Output this shift: No intake/output data recorded.  Lab Results: Recent Labs    07/20/19 0256 07/21/19 0600 07/22/19 0201  WBC 9.1 5.3 6.3  HGB 12.6 11.9* 11.9*  HCT 39.6 36.0 36.5  PLT 166 188 196   BMET Recent Labs    07/19/19 2124 07/22/19 0201  NA 141 141  K 3.4* 3.4*  CL 108 108  CO2 22 22  GLUCOSE 96 107*  BUN 10 7*  CREATININE 0.91 0.65  CALCIUM 9.0 8.9   LFT Recent Labs    07/21/19 0600 07/22/19 0201  PROT 5.4* 5.6*  ALBUMIN 3.0* 3.1*  AST 114* 55*  ALT 249* 194*  ALKPHOS 172* 160*  BILITOT 1.4* 1.1  BILIDIR 0.6*  --   IBILI 0.8  --    PT/INR No results for input(s): LABPROT, INR in the last 72 hours.  Studies/Results: Dg Ercp Sphincterotomy  Result Date: 07/21/2019 CLINICAL DATA:  Choledocholithiasis EXAM: ERCP TECHNIQUE: Multiple spot images obtained with the fluoroscopic device and submitted for interpretation post-procedure. COMPARISON:  MRCP 07/19/2019 FINDINGS: A series of fluoroscopic spot images document endoscopic cannulation and  opacification of the pancreatic duct, placement of a small-caliber catheter, subsequent cannulation of the CBD , opacification of the biliary tree and subsequent passage of a balloon tipped catheter through the CBD. Limited opacification and evaluation of the intrahepatic biliary tree. No evidence of extravasation. IMPRESSION: Pancreatic and common bile duct cannulation and intervention as above. These images were submitted for radiologic interpretation only. Please see the procedural report for the amount of contrast and the fluoroscopy time utilized. Electronically Signed   By: Lucrezia Europe M.D.   On: 07/21/2019 14:15       Assessment / Plan:   73 y/o female on Eliquis for AF who presented with choledocholithiasis. She is now s/p ERCP yesterday with sphincterotomy, removal of stones / sludge, and biliary stent placement. She appears to be doing well post procedure, no clinical evidence of pancreatitis. She is scheduled for cholecystectomy today with general surgery.  She had several questions about her ERCP today which I answered. She is quite fearful of getting pancreatitis post ERCP but I reassured her she looks good today and I think unlikely that will happen at this point in time.   Moving forward Dr. Rush Landmark has recommended protonix 40mg  BID for 1 month and then 40mg  daily for one month post ERCP, and then KUB in 10 days to ensure biliary stent has passed.   Plan: - cholecystectomy today - protonix 40mg  BID for 1 month, then  daily for 1 month, then stop - KUB in 14 days to assess biliary stent, our office will coordinate - ideally would hold anticoagulation for 72 hours post sphincterotomy to prevent bleeding, can resume Eliquis after that time if okay with surgery pending findings at cholecystectomy  We will sign off for now, please call with questions moving forward.  Carver Cellar, MD [facility]

## 2019-07-22 NOTE — Anesthesia Preprocedure Evaluation (Addendum)
Anesthesia Evaluation  Patient identified by MRN, date of birth, ID band Patient awake    Reviewed: Allergy & Precautions, Patient's Chart, lab work & pertinent test results  Airway Mallampati: II  TM Distance: >3 FB Neck ROM: Full    Dental  (+) Teeth Intact, Dental Advisory Given   Pulmonary neg pulmonary ROS,    breath sounds clear to auscultation       Cardiovascular + dysrhythmias Atrial Fibrillation  Rhythm:Regular Rate:Normal     Neuro/Psych Anxiety negative neurological ROS     GI/Hepatic negative GI ROS, Neg liver ROS,   Endo/Other  negative endocrine ROS  Renal/GU negative Renal ROS     Musculoskeletal negative musculoskeletal ROS (+)   Abdominal Normal abdominal exam  (+)   Peds  Hematology negative hematology ROS (+)   Anesthesia Other Findings   Reproductive/Obstetrics                            Lab Results  Component Value Date   WBC 6.3 07/22/2019   HGB 11.9 (L) 07/22/2019   HCT 36.5 07/22/2019   MCV 87.5 07/22/2019   PLT 196 07/22/2019   Lab Results  Component Value Date   CREATININE 0.65 07/22/2019   BUN 7 (L) 07/22/2019   NA 141 07/22/2019   K 3.4 (L) 07/22/2019   CL 108 07/22/2019   CO2 22 07/22/2019   No results found for: INR, PROTIME   Anesthesia Physical Anesthesia Plan  ASA: III  Anesthesia Plan: General   Post-op Pain Management:    Induction: Intravenous  PONV Risk Score and Plan: 4 or greater and Ondansetron, Treatment may vary due to age or medical condition and Dexamethasone  Airway Management Planned: Oral ETT  Additional Equipment: None  Intra-op Plan:   Post-operative Plan: Extubation in OR  Informed Consent: I have reviewed the patients History and Physical, chart, labs and discussed the procedure including the risks, benefits and alternatives for the proposed anesthesia with the patient or authorized representative who has  indicated his/her understanding and acceptance.   Patient has DNR.  Discussed DNR with patient and Suspend DNR.   Dental advisory given  Plan Discussed with: CRNA  Anesthesia Plan Comments:       Anesthesia Quick Evaluation

## 2019-07-22 NOTE — Anesthesia Postprocedure Evaluation (Signed)
Anesthesia Post Note  Patient: [place] Adderley  Procedure(s) Performed: LAPAROSCOPIC CHOLECYSTECTOMY (N/A Abdomen)     Patient location during evaluation: PACU Anesthesia Type: General Level of consciousness: awake and alert Pain management: pain level controlled Vital Signs Assessment: post-procedure vital signs reviewed and stable Respiratory status: spontaneous breathing, nonlabored ventilation, respiratory function stable and patient connected to nasal cannula oxygen Cardiovascular status: blood pressure returned to baseline and stable Postop Assessment: no apparent nausea or vomiting Anesthetic complications: no    Last Vitals:  Vitals:   07/22/19 1142 07/22/19 1237  BP: (!) 175/90 (!) 142/83  Pulse: 78 62  Resp: 17 20  Temp: 36.7 C 36.7 C  SpO2: 100% 95%    Last Pain:  Vitals:   07/22/19 1237  TempSrc: Oral  PainSc:                  Effie Berkshire

## 2019-07-22 NOTE — Progress Notes (Signed)
Hospitalist daily note   [place] [id number] DOB: 06/07/1946 DOA: 07/18/2019  PCP: Aura Dials, MD   Narrative:  46 WF A. fib [place] score >4/Eliquis bipolar, hypothyroid normal colonoscopy 2007 2013 admitted 12/1 choledocholithiasis abdominal pain  Data Reviewed:  Potassium 3.4 AST/ALT 190/3375 (on admission 476/542) bilirubin 2.9 White count 9.1 on admission 14 Imaging-procedures Ultrasound abdomen 12/1 cholelithiasis sludge as well as fatty infiltration MRCP 12/2 choledocholithiasis ERCP 12/4 failed cannulation fistulotomy over pancreatic stent performed choledocholithiasis + sludge removal-needs KUB 12/14 to document migration of stent Lap chole 12/5 dr Kae Heller Assessment & Plan: Choledocholelithiasis-ERCP done 12/4, lap chole 12/5-start PPI X1 month-pancreatic stent migration--d/c Zosyn post-op?--likely home soon--mainjly tylenol 100 for pain and Naprosyn 500 bid for breakthrough Atrial fibrillation-Eliquis 5 on hold and discontinue heparin until post-op as above Anxiety-resuming trazodone 50, Xanax increasing to 0.5 bid  Full code-likely d/c 12/6  Subjective: No distress--a little pain Still hasn't eaten Pain mod--long discussion with her regarding further management and Dispo   Consultants:   Gi  gen surg  Antimicrobials:   Zosyn   Objective: Vitals:   07/22/19 0900 07/22/19 1100 07/22/19 1142 07/22/19 1237  BP: (!) 146/123 (!) 162/95 (!) 175/90 (!) 142/83  Pulse: 66 82 78 62  Resp: 12 (!) 37 17 20  Temp:  97.9 F (36.6 C) 98 F (36.7 C) 98 F (36.7 C)  TempSrc:   Oral Oral  SpO2: 100% 100% 100% 95%  Weight:      Height:        Intake/Output Summary (Last 24 hours) at 07/22/2019 1332 Last data filed at 07/22/2019 1105 Gross per 24 hour  Intake 500 ml  Output -  Net 500 ml   Filed Weights   07/21/19 0700 07/21/19 0957 07/22/19 0504  Weight: 76 kg 76 kg 76 kg   Examination:  eomi nca tno focal deficit cta b Abd has post-surg scars --looks  clean no overt tenderness except over wound No le edema  Scheduled Meds: . ALPRAZolam  0.5 mg Oral QHS  . baclofen  20 mg Oral QHS  . naproxen  500 mg Oral BID WC  . oxyCODONE      . sodium chloride flush  3 mL Intravenous Once  . traZODone  50 mg Oral QHS   Continuous Infusions: . piperacillin-tazobactam (ZOSYN)  IV 3.375 g (07/22/19 0803)     LOS: 4 days   Time spent: [place], MD [facility]

## 2019-07-22 NOTE — Progress Notes (Signed)
ANTICOAGULATION CONSULT NOTE - Follow Up Consult  Pharmacy Consult for Heparin Indication: atrial fibrillation   No Known Allergies  Patient Measurements: Height: 5\' 5"  (165.1 cm) Weight: 167 lb 8.8 oz (76 kg) IBW/kg (Calculated) : 57  Vital Signs: Temp: 98 F (36.7 C) (12/05 1237) Temp Source: Oral (12/05 1237) BP: 142/83 (12/05 1237) Pulse Rate: 62 (12/05 1237)  Labs: Recent Labs    07/19/19 2124  07/20/19 0256 07/21/19 0600 07/22/19 0201  HGB  --    < > 12.6 11.9* 11.9*  HCT  --   --  39.6 36.0 36.5  PLT  --   --  166 188 196  APTT  --   --  81* 58*  --   HEPARINUNFRC  --   --  0.58 0.47 <0.10*  CREATININE 0.91  --   --   --  0.65   < > = values in this interval not displayed.    Estimated Creatinine Clearance: 62.9 mL/min (by C-G formula based on SCr of 0.65 mg/dL).  Assessment: Anticoag: Heparin for hx afib (PTA apixban - LD 12/1), Heparin was off since 12/4 prior and post ERCP.  Heparin remained off  pending cholecystectomy today 07/22/19.  Now she is s/p cholecystectomy, pharmacy consulted to restart IV heparin drip, no bolus, at 20:00 tonight.  Hgb 12.6>11.9>11.9. Plts stable, no bleeding noted Heparin level was therapeutic on 12/4 on 1000 units/hr prior to heparin being stopped prior to procedures. Will restart heparin at 1000 units/hr tonight.   Goal of Therapy:  Heparin level 0.3-0.7 units/ml  Monitor platelets by anticoagulation protocol: Yes   Plan:  At 20:00 tonight restart IV Heparin drip 1000 units/hr (no bolus) Check 8 hour Heparin level Daily HL, CBC  Thank you for allowing pharmacy to be part of this patients care team.  Nicole Cella, [place] Staff Pharmacist 07/22/2019,2:19 PM

## 2019-07-22 NOTE — Transfer of Care (Signed)
Immediate Anesthesia Transfer of Care Note  Patient: Carly Rhodes  Procedure(s) Performed: LAPAROSCOPIC CHOLECYSTECTOMY (N/A Abdomen)  Patient Location: PACU  Anesthesia Type:General  Level of Consciousness: awake, alert  and oriented  Airway & Oxygen Therapy: Patient Spontanous Breathing and Patient connected to face mask oxygen  Post-op Assessment: Report given to RN, Post -op Vital signs reviewed and stable and Patient moving all extremities X 4  Post vital signs: Reviewed and stable  Last Vitals:  Vitals Value Taken Time  BP 162/95 07/22/19 1100  Temp 36.6 C 07/22/19 1100  Pulse 82 07/22/19 1100  Resp 37 07/22/19 1100  SpO2 100 % 07/22/19 1100    Last Pain:  Vitals:   07/22/19 1100  TempSrc:   PainSc: 9       Patients Stated Pain Goal: 1 ([id number])  Complications: No apparent anesthesia complications

## 2019-07-22 NOTE — Op Note (Signed)
Operative Note  Carly Rhodes 73 y.o. female [id number]  07/22/2019  Surgeon: Clovis Riley MD FACS  Assistant: OR staff  Procedure performed: Laparoscopic Cholecystectomy  Procedure classification: urgent  Preop diagnosis: cholecocholithiasis s/p ERCP Post-op diagnosis/intraop findings: same  Specimens: gallbladder  Retained items: none  EBL: minimal  Complications: none  Description of procedure: After obtaining informed consent the patient was brought to the operating room. Antibiotics were administered. SCD's were applied. General endotracheal anesthesia was initiated and a formal time-out was performed. The abdomen was prepped and draped in the usual sterile fashion and the abdomen was entered using an infraumbilical veress needle after instilling the site with local. Insufflation to 66mmHg was obtained, 23mm trocar and camera inserted, and gross inspection revealed no evidence of injury from our entry or other intraabdominal abnormalities. Two 85mm trocars were introduced in the right midclavicular and right anterior axillary lines under direct visualization and following infiltration with local. An 61mm trocar was placed in the epigastrium. The gallbladder was retracted cephalad and the infundibulum was retracted laterally. A combination of hook electrocautery and blunt dissection was utilized to clear the peritoneum from the neck and cystic duct, circumferentially isolating the cystic artery and cystic duct and lifting the gallbladder from the cystic plate. The critical view of safety was achieved with the cystic artery, cystic duct, and liver bed visualized between them with no other structures. The artery was clipped with a single clip proximally and distally and divided as was the cystic duct with three clips on the proximal end. A small posterior branch artery was ligated with a clip. The gallbladder was dissected from the liver plate using electrocautery. Once freed the  gallbladder was placed in an endocatch bag and removed intact through the epigastric trocar site. A small amount of bleeding on the liver bed was controlled with cautery and surgicel snow. The right upper quadrant was irrigated and aspirated and the effluent was clear. Hemostasis was once again confirmed, and reinspection of the abdomen revealed no injuries. The clips were well opposed without any bile leak from the duct or the liver bed. The 49mm trocar site in the epigastrium was closed with a 0 vicryl in the fascia under direct visualization using a PMI device. The abdomen was desufflated and all trocars removed. The skin incisions were closed with running subcuticular monocryl and Dermabond. The patient was awakened, extubated and transported to the recovery room in stable condition.    All counts were correct at the completion of the case.

## 2019-07-23 ENCOUNTER — Encounter (HOSPITAL_COMMUNITY): Payer: Self-pay | Admitting: Surgery

## 2019-07-23 LAB — CBC WITH DIFFERENTIAL/PLATELET
Abs Immature Granulocytes: 0.06 10*3/uL (ref 0.00–0.07)
Basophils Absolute: 0 10*3/uL (ref 0.0–0.1)
Basophils Relative: 0 %
Eosinophils Absolute: 0 10*3/uL (ref 0.0–0.5)
Eosinophils Relative: 0 %
HCT: 36 % (ref 36.0–46.0)
Hemoglobin: 12.1 g/dL (ref 12.0–15.0)
Immature Granulocytes: 1 %
Lymphocytes Relative: 21 %
Lymphs Abs: 1.7 10*3/uL (ref 0.7–4.0)
MCH: 29.1 pg (ref 26.0–34.0)
MCHC: 33.6 g/dL (ref 30.0–36.0)
MCV: 86.5 fL (ref 80.0–100.0)
Monocytes Absolute: 0.6 10*3/uL (ref 0.1–1.0)
Monocytes Relative: 8 %
Neutro Abs: 5.6 10*3/uL (ref 1.7–7.7)
Neutrophils Relative %: 70 %
Platelets: 204 10*3/uL (ref 150–400)
RBC: 4.16 MIL/uL (ref 3.87–5.11)
RDW: 14 % (ref 11.5–15.5)
WBC: 8.1 10*3/uL (ref 4.0–10.5)
nRBC: 0 % (ref 0.0–0.2)

## 2019-07-23 LAB — GLUCOSE, CAPILLARY: Glucose-Capillary: 84 mg/dL (ref 70–99)

## 2019-07-23 LAB — HEPARIN LEVEL (UNFRACTIONATED): Heparin Unfractionated: 0.46 IU/mL (ref 0.30–0.70)

## 2019-07-23 LAB — LIPASE, BLOOD: Lipase: 83 U/L — ABNORMAL HIGH (ref 11–51)

## 2019-07-23 MED ORDER — NAPROXEN 500 MG PO TABS: 500.0000 mg | ORAL_TABLET | Freq: Two times a day (BID) | ORAL | 0 refills | Status: DC

## 2019-07-23 MED ORDER — ACETAMINOPHEN 500 MG PO TABS: 1000.0000 mg | ORAL_TABLET | Freq: Four times a day (QID) | ORAL | Status: DC | PRN

## 2019-07-23 MED ORDER — NAPROXEN 500 MG PO TABS: 500.0000 mg | ORAL_TABLET | Freq: Two times a day (BID) | ORAL | Status: DC

## 2019-07-23 MED ORDER — TRAMADOL HCL 50 MG PO TABS: 50.0000 mg | ORAL_TABLET | Freq: Four times a day (QID) | ORAL | 0 refills | Status: DC | PRN

## 2019-07-23 MED ORDER — APIXABAN 5 MG PO TABS
5.0000 mg | ORAL_TABLET | Freq: Two times a day (BID) | ORAL | Status: DC
  Administered 2019-07-23: 5 mg via ORAL
  Filled 2019-07-23: qty 1

## 2019-07-23 NOTE — Discharge Summary (Addendum)
Physician Discharge Summary  [facility] [id number] DOB: [birth year] DOA: 07/18/2019  PCP: Aura Dials, MD  Admit date: 07/18/2019 Discharge date: 07/23/2019  Time spent: 25 minutes  Recommendations for Outpatient Follow-up:  1. Needs postop follow-up general surgery-pain meds given by general surgery on discharge 2. Resume all home meds 3. Outpatient screening LFTs etc. 4. Screen for pathological grief as lost husband up to 6 weeks ago and may need intensive psychotherapy-May need bereavement services but most importantly may need continued engagement given risk of being lonely  Discharge Diagnoses:  Principal Problem:   Calculus of gallbladder with biliary obstruction but without cholecystitis Active Problems:   Atrial fibrillation, chronic (HCC)   LFTs abnormal   Abdominal pain   Gallstones   Choledocholithiasis   Discharge Condition: Improved  Diet recommendation: Low-fat low-salt  Filed Weights   07/21/19 0957 07/22/19 0504 07/23/19 0316  Weight: 76 kg 76 kg 76 kg    Narrative:  73 WF A. fib [place] score >4/Eliquis bipolar, hypothyroid normal colonoscopy 2007 2013 admitted 12/1 choledocholithiasis abdominal pain  Data Reviewed:  Potassium 3.4 AST/ALT 190/3375 (on admission 476/542) bilirubin 2.9 White count 9.1 on admission 14 Imaging-procedures Ultrasound abdomen 12/1 cholelithiasis sludge as well as fatty infiltration MRCP 12/2 choledocholithiasis ERCP 12/4 failed cannulation fistulotomy over pancreatic stent performed choledocholithiasis + sludge removal-needs KUB 12/14 to document migration of stent Lap chole 12/5 dr Kae Heller Assessment & Plan: Choledocholelithiasis-ERCP done 12/4, lap chole 12/5-start PPI X1 month-pancreatic stent migration--d/c Zosyn post-op-she was refusing to take oxycodone as this made her feel short winded so general surgery discussed with her possibility of using tramadol for severe pain Tylenol and Naprosyn were given on discharge in  addition Atrial fibrillation-Eliquis 5 initially held until surgery and then resumed on discharge-note A. fib is paroxysmal and not on any rate controlling agent Anxiety-resuming trazodone 50, Xanax increasing to 0.5 bid during hospital stay but reverted back to usual home dose    Discharge Exam: Vitals:   07/23/19 0300 07/23/19 0758  BP: 122/82 137/81  Pulse: 62 (!) 58  Resp: 20 (!) 21  Temp: 98 F (36.7 C) 97.9 F (36.6 C)  SpO2: 96% 96%   Doing well, passing some flatus no distress no fever no chills   General: Awake coherent pleasant no distress Cardiovascular: S1-S2 sinus rhythm Respiratory: Clinically clear Abdomen soft no rebound no guarding  Discharge Instructions   Discharge Instructions    Diet - low sodium heart healthy   Complete by: As directed    Discharge instructions   Complete by: As directed    Please follow-up postoperative directions and discussions from general surgery perspective-you may use Tylenol as well as Tylenol 3 and as needed Naprosyn and or tramadol for your pain You can resume your other medications from home as needed Please follow-up with general surgeon as an outpatient   Increase activity slowly   Complete by: As directed      Allergies as of 07/23/2019   No Known Allergies     Medication List    TAKE these medications   acetaminophen 500 MG tablet Commonly known as: TYLENOL Take 2 tablets (1,000 mg total) by mouth every 6 (six) hours as needed for moderate pain.   acetaminophen-codeine 300-30 MG tablet Commonly known as: TYLENOL #3 Take 2 tablets by mouth at bedtime.   ALPRAZolam 0.5 MG tablet Commonly known as: XANAX Take 0.5 mg by mouth at bedtime.   baclofen 10 MG tablet Commonly known as: LIORESAL Take 20 mg by  mouth at bedtime.   cyanocobalamin 1000 MCG/ML injection Commonly known as: (VITAMIN B-12) Inject 1 mL into the muscle every 30 (thirty) days.   Eliquis 5 MG Tabs tablet Generic drug: apixaban Take 5  mg by mouth 2 (two) times daily.   ezetimibe 10 MG tablet Commonly known as: ZETIA Take 10 mg by mouth daily.   naproxen 500 MG tablet Commonly known as: NAPROSYN Take 1 tablet (500 mg total) by mouth 2 (two) times daily with a meal.   traMADol 50 MG tablet Commonly known as: ULTRAM Take 1 tablet (50 mg total) by mouth every 6 (six) hours as needed for moderate pain.   traZODone 50 MG tablet Commonly known as: DESYREL Take 50 mg by mouth at bedtime.      No Known Allergies Follow-up [facility] [place]. Call.   Specialty: General Surgery Why: A provider will call you 12/22 at 10:15 am Please send a photo of your incisions to [email or web address] prior to your appointment; include name and date of birth on email Contact information: [address] [place] [phone]           The results of significant diagnostics from this hospitalization (including imaging, microbiology, ancillary and laboratory) are listed below for reference.    Significant Diagnostic Studies: US Abdomen Complete  Result Date: 07/18/2019 CLINICAL DATA:  Epigastric and right upper quadrant pain EXAM: ABDOMEN ULTRASOUND COMPLETE COMPARISON:  None. FINDINGS: Gallbladder: Gallbladder is mildly distended. 9 mm mobile gallstone noted. No wall thickening or sonographic Murphy's sign. Small amount of sludge within the gallbladder. Common bile duct: Diameter: Upper limits normal in caliber at 7 mm. Liver: Increased echotexture compatible with fatty infiltration. No focal abnormality or biliary ductal dilatation. Portal vein is patent on color Doppler imaging with normal direction of blood flow towards the liver. IVC: No abnormality visualized. Pancreas: Visualized portion unremarkable. Spleen: Size and appearance within normal limits. Right Kidney: Length: 9.5 cm. Echogenicity within normal limits. No mass or hydronephrosis  visualized. Left Kidney: Length: 9.5 cm. Echogenicity within normal limits. No mass or hydronephrosis visualized. Abdominal aorta: No aneurysm visualized. Other findings: None. IMPRESSION: Cholelithiasis and sludge within the gallbladder. No evidence of acute cholecystitis. Fatty infiltration of the liver. Electronically Signed   By: Rolm Baptise M.D.   On: 07/18/2019 21:49   Mr 3d Recon At Scanner  Result Date: 07/19/2019 CLINICAL DATA:  Abnormal liver function studies. Cholelithiasis. EXAM: MRI ABDOMEN WITHOUT AND WITH CONTRAST (INCLUDING MRCP) TECHNIQUE: Multiplanar multisequence MR imaging of the abdomen was performed both before and after the administration of intravenous contrast. Heavily T2-weighted images of the biliary and pancreatic ducts were obtained, and three-dimensional MRCP images were rendered by post processing. CONTRAST:  86mL GADAVIST GADOBUTROL 1 MMOL/ML IV SOLN COMPARISON:  Abdominal ultrasound 07/18/2019. FINDINGS: Lower chest: Minimal atelectasis at both lung bases. The visualized lower chest otherwise appears unremarkable. Hepatobiliary: No significant hepatic steatosis or focal abnormality. There are several small dependent gallstones. The common hepatic duct measures up to 11 mm in diameter. There are several (at least 3) calculi in the distal common bile duct, measuring up to 7 mm in diameter. No significant intrahepatic biliary dilatation. Pancreas: Unremarkable. No pancreatic ductal dilatation or surrounding inflammatory changes. Spleen: Normal in size without focal abnormality. Adrenals/Urinary Tract: Both adrenal glands appear normal. There are tiny renal cysts. The kidneys and ureters otherwise appear normal. No hydronephrosis. Stomach/Bowel: No evidence of bowel wall thickening, distention or surrounding  inflammatory change. Vascular/Lymphatic: There are no enlarged abdominal lymph nodes. No significant vascular findings are present. Other: No ascites. Musculoskeletal: No acute  or significant osseous findings. Mild lumbar spondylosis. IMPRESSION: 1. Cholelithiasis and choledocholithiasis with mild extrahepatic biliary dilatation. No evidence of cholecystitis. 2. No other significant findings. Electronically Signed   By: Richardean Sale M.D.   On: 07/19/2019 08:43   Dg Ercp Sphincterotomy  Result Date: 07/21/2019 CLINICAL DATA:  Choledocholithiasis EXAM: ERCP TECHNIQUE: Multiple spot images obtained with the fluoroscopic device and submitted for interpretation post-procedure. COMPARISON:  MRCP 07/19/2019 FINDINGS: A series of fluoroscopic spot images document endoscopic cannulation and opacification of the pancreatic duct, placement of a small-caliber catheter, subsequent cannulation of the CBD , opacification of the biliary tree and subsequent passage of a balloon tipped catheter through the CBD. Limited opacification and evaluation of the intrahepatic biliary tree. No evidence of extravasation. IMPRESSION: Pancreatic and common bile duct cannulation and intervention as above. These images were submitted for radiologic interpretation only. Please see the procedural report for the amount of contrast and the fluoroscopy time utilized. Electronically Signed   By: Lucrezia Europe M.D.   On: 07/21/2019 14:15   Mr Abdomen Mrcp Moise Boring Contast  Result Date: 07/19/2019 CLINICAL DATA:  Abnormal liver function studies. Cholelithiasis. EXAM: MRI ABDOMEN WITHOUT AND WITH CONTRAST (INCLUDING MRCP) TECHNIQUE: Multiplanar multisequence MR imaging of the abdomen was performed both before and after the administration of intravenous contrast. Heavily T2-weighted images of the biliary and pancreatic ducts were obtained, and three-dimensional MRCP images were rendered by post processing. CONTRAST:  95mL GADAVIST GADOBUTROL 1 MMOL/ML IV SOLN COMPARISON:  Abdominal ultrasound 07/18/2019. FINDINGS: Lower chest: Minimal atelectasis at both lung bases. The visualized lower chest otherwise appears unremarkable.  Hepatobiliary: No significant hepatic steatosis or focal abnormality. There are several small dependent gallstones. The common hepatic duct measures up to 11 mm in diameter. There are several (at least 3) calculi in the distal common bile duct, measuring up to 7 mm in diameter. No significant intrahepatic biliary dilatation. Pancreas: Unremarkable. No pancreatic ductal dilatation or surrounding inflammatory changes. Spleen: Normal in size without focal abnormality. Adrenals/Urinary Tract: Both adrenal glands appear normal. There are tiny renal cysts. The kidneys and ureters otherwise appear normal. No hydronephrosis. Stomach/Bowel: No evidence of bowel wall thickening, distention or surrounding inflammatory change. Vascular/Lymphatic: There are no enlarged abdominal lymph nodes. No significant vascular findings are present. Other: No ascites. Musculoskeletal: No acute or significant osseous findings. Mild lumbar spondylosis. IMPRESSION: 1. Cholelithiasis and choledocholithiasis with mild extrahepatic biliary dilatation. No evidence of cholecystitis. 2. No other significant findings. Electronically Signed   By: Richardean Sale M.D.   On: 07/19/2019 08:43    Microbiology: Recent Results (from the past 240 hour(s))  SARS CORONAVIRUS 2 (TAT 6-24 HRS) Nasopharyngeal Nasopharyngeal Swab     Status: None   Collection Time: 07/18/19 10:27 PM   Specimen: Nasopharyngeal Swab  Result Value Ref Range Status   SARS Coronavirus 2 NEGATIVE NEGATIVE Final    Comment: (NOTE) SARS-CoV-2 target nucleic acids are NOT DETECTED. The SARS-CoV-2 RNA is generally detectable in upper and lower respiratory specimens during the acute phase of infection. Negative results do not preclude SARS-CoV-2 infection, do not rule out co-infections with other pathogens, and should not be used as the sole basis for treatment or other patient management decisions. Negative results must be combined with clinical observations, patient  history, and epidemiological information. The expected result is Negative. Fact Sheet for Patients: SugarRoll.be Fact Sheet for  Healthcare Providers: https://www.woods-mathews.com/ This test is not yet approved or cleared by the [place] and  has been authorized for detection and/or diagnosis of SARS-CoV-2 by FDA under an Emergency Use Authorization (EUA). This EUA will remain  in effect (meaning this test can be used) for the duration of the COVID-19 declaration under Section 56 4(b)(1) of the Act, 21 U.S.C. section 360bbb-3(b)(1), unless the authorization is terminated or revoked sooner. Performed at [facility], [place] [address], [place], [place] [address]      Labs: Basic Metabolic Panel: Recent Labs  Lab 07/18/19 1801 07/19/19 0601 07/19/19 2124 07/22/19 0201 07/22/19 1413  NA 140 138 141 141 139  K 3.8 3.6 3.4* 3.4* 3.5  CL 105 108 108 108 106  CO2 22 22 22 22 23   GLUCOSE 117* 131* 96 107* 128*  BUN 10 11 10  7* 6*  CREATININE 0.87 1.00 0.91 0.65 0.74  CALCIUM 9.6 8.6* 9.0 8.9 9.1   Liver Function Tests: Recent Labs  Lab 07/19/19 0601 07/19/19 2124 07/21/19 0600 07/22/19 0201 07/22/19 1413  AST 317* 190* 114* 55* 98*  ALT 451* 375* 249* 194* 220*  ALKPHOS 183* 187* 172* 160* 168*  BILITOT 2.9* 2.7* 1.4* 1.1 1.1  PROT 5.2* 6.0* 5.4* 5.6* 6.1*  ALBUMIN 3.0* 3.2* 3.0* 3.1* 3.3*   Recent Labs  Lab 07/18/19 1801 07/22/19 0201 07/23/19 0354  LIPASE 59* 103* 83*   No results for input(s): AMMONIA in the last 168 hours. CBC: Recent Labs  Lab 07/19/19 0601 07/20/19 0256 07/21/19 0600 07/22/19 0201 07/23/19 0354  WBC 14.3* 9.1 5.3 6.3 8.1  NEUTROABS 12.4*  --   --   --  5.6  HGB 12.3 12.6 11.9* 11.9* 12.1  HCT 37.5 39.6 36.0 36.5 36.0  MCV 89.5 90.2 87.4 87.5 86.5  PLT 170 166 188 196 204   Cardiac Enzymes: No results for input(s): CKTOTAL, CKMB, CKMBINDEX, TROPONINI in the last 168  hours. BNP: BNP (last 3 results) No results for input(s): BNP in the last 8760 hours.  ProBNP (last 3 results) No results for input(s): PROBNP in the last 8760 hours.  CBG: Recent Labs  Lab 07/21/19 1529 07/21/19 2340 07/22/19 0756 07/22/19 1722 07/22/19 2306  GLUCAP 158* 115* 81 127* 116*       Signed:  Nita Sells MD   [facility] 07/23/2019, 8:40 AM

## 2019-07-23 NOTE — Progress Notes (Signed)
[facility] Progress Note  1 Day Post-Op  Subjective: CC: soreness Abdomen is sore. Denies nausea, tolerating diet but does not have much of an appetite yet. +flatus. Discussed post-op follow up and instructions.   Objective: Vital signs in last 24 hours: Temp:  [97.9 F (36.6 C)-98.5 F (36.9 C)] 97.9 F (36.6 C) (12/06 0758) Pulse Rate:  [58-82] 58 (12/06 0758) Resp:  [12-37] 21 (12/06 0758) BP: (117-175)/(72-123) 137/81 (12/06 0758) SpO2:  [95 %-100 %] 96 % (12/06 0758) Weight:  [76 kg] 76 kg (12/06 0316) Last BM Date: 07/18/19  Intake/Output from previous day: 12/05 0701 - 12/06 0700 In: 679.6 [I.V.:579.6; IV Piggyback:100] Out: 400 [Urine:400] Intake/Output this shift: No intake/output data recorded.  PE: Gen:  Alert, NAD, pleasant Card:  Regular rate and rhythm, pedal pulses 2+ BL Pulm:  Normal effort, clear to auscultation bilaterally Abd: Soft, non-tender, non-distended, +BS, no HSM, incisions C/D/I Skin: warm and dry, no rashes  Psych: A&Ox3   Lab Results:  Recent Labs    07/22/19 0201 07/23/19 0354  WBC 6.3 8.1  HGB 11.9* 12.1  HCT 36.5 36.0  PLT 196 204   BMET Recent Labs    07/22/19 0201 07/22/19 1413  NA 141 139  K 3.4* 3.5  CL 108 106  CO2 22 23  GLUCOSE 107* 128*  BUN 7* 6*  CREATININE 0.65 0.74  CALCIUM 8.9 9.1   PT/INR No results for input(s): LABPROT, INR in the last 72 hours. CMP     Component Value Date/Time   NA 139 07/22/2019 1413   K 3.5 07/22/2019 1413   CL 106 07/22/2019 1413   CO2 23 07/22/2019 1413   GLUCOSE 128 (H) 07/22/2019 1413   BUN 6 (L) 07/22/2019 1413   CREATININE 0.74 07/22/2019 1413   CALCIUM 9.1 07/22/2019 1413   PROT 6.1 (L) 07/22/2019 1413   ALBUMIN 3.3 (L) 07/22/2019 1413   AST 98 (H) 07/22/2019 1413   ALT 220 (H) 07/22/2019 1413   ALKPHOS 168 (H) 07/22/2019 1413   BILITOT 1.1 07/22/2019 1413   GFRNONAA >60 07/22/2019 1413   GFRAA >60 07/22/2019 1413   Lipase     Component Value  Date/Time   LIPASE 83 (H) 07/23/2019 0354       Studies/Results: Dg Ercp Sphincterotomy  Result Date: 07/21/2019 CLINICAL DATA:  Choledocholithiasis EXAM: ERCP TECHNIQUE: Multiple spot images obtained with the fluoroscopic device and submitted for interpretation post-procedure. COMPARISON:  MRCP 07/19/2019 FINDINGS: A series of fluoroscopic spot images document endoscopic cannulation and opacification of the pancreatic duct, placement of a small-caliber catheter, subsequent cannulation of the CBD , opacification of the biliary tree and subsequent passage of a balloon tipped catheter through the CBD. Limited opacification and evaluation of the intrahepatic biliary tree. No evidence of extravasation. IMPRESSION: Pancreatic and common bile duct cannulation and intervention as above. These images were submitted for radiologic interpretation only. Please see the procedural report for the amount of contrast and the fluoroscopy time utilized. Electronically Signed   By: Lucrezia Europe M.D.   On: 07/21/2019 14:15    Anti-infectives: Anti-infectives (From admission, onward)   Start     Dose/Rate Route Frequency Ordered Stop   07/19/19 0800  piperacillin-tazobactam (ZOSYN) IVPB 3.375 g     3.375 g 12.5 mL/hr over 240 Minutes Intravenous Every 8 hours 07/19/19 0008     07/19/19 0015  piperacillin-tazobactam (ZOSYN) IVPB 3.375 g     3.375 g 100 mL/hr over 30 Minutes Intravenous STAT 07/19/19 0008  07/19/19 0136       Assessment/Plan Anxiety  Atrial fibrillation on eliquis (last dose 12/1)  Choledocholithiasis S/p ERCP 12/4 S/p laparoscopic cholecystectomy 12/5 - POD#1 - appropriately sore but comfortable - tolerating diet - incisions c/d/i - stable for discharge from surgery standpoint, follow up in chart - restart eliquis tomorrow  ID - zosyn 12/2>> VTE - SCDs, heparin gtt FEN - IVF, NPO Foley - none Follow up - [facility]  LOS: 5 days    Brigid Re , [facility] 07/23/2019, 8:19 AM Please see Amion for pager number during day hours 7:00am-4:30pm

## 2019-07-23 NOTE — Progress Notes (Signed)
CSW provided taxi voucher to patient for safe discharge.   Please consult again for further needs.   Domenic Schwab, MSW, [place] Worker [facility]  [phone]

## 2019-07-24 ENCOUNTER — Encounter: Payer: Self-pay | Admitting: Sports Medicine

## 2019-07-24 ENCOUNTER — Encounter (HOSPITAL_COMMUNITY): Payer: Self-pay | Admitting: Gastroenterology

## 2019-07-24 ENCOUNTER — Telehealth: Payer: Self-pay

## 2019-07-24 DIAGNOSIS — T85520S Displacement of bile duct prosthesis, sequela: Secondary | ICD-10-CM

## 2019-07-24 MED ORDER — PANTOPRAZOLE SODIUM 40 MG PO TBEC: 40.0000 mg | DELAYED_RELEASE_TABLET | Freq: Two times a day (BID) | ORAL | 3 refills | Status: DC

## 2019-07-24 NOTE — Telephone Encounter (Signed)
-----   Message from Yetta Flock, MD sent at 07/22/2019  7:49 AM EST ----- Regarding: follow up Dr. Shelton Silvas this patient will need a KUB in 14 days to check on biliary stent if you can help coordinate with her sometime next week after her discharge. Can you please ensure she is on protonix 40mg  BID for 1 month, then once daily thereafter. Xray should be ordered under Dr. Rush Landmark so he can review it. Thanks

## 2019-07-25 DIAGNOSIS — M1611 Unilateral primary osteoarthritis, right hip: Secondary | ICD-10-CM | POA: Diagnosis not present

## 2019-07-25 DIAGNOSIS — E782 Mixed hyperlipidemia: Secondary | ICD-10-CM | POA: Diagnosis not present

## 2019-07-25 LAB — SURGICAL PATHOLOGY

## 2019-08-01 ENCOUNTER — Ambulatory Visit: Payer: PPO | Admitting: Family Medicine

## 2019-08-02 ENCOUNTER — Telehealth: Payer: Self-pay

## 2019-08-02 NOTE — Telephone Encounter (Signed)
-----   Message from Timothy Lasso, RN sent at 07/24/2019 11:52 AM EST ----- Pt needs KUB

## 2019-08-02 NOTE — Telephone Encounter (Signed)
The pt has been advised and will come in at her convenience for Xray

## 2019-08-04 ENCOUNTER — Ambulatory Visit (INDEPENDENT_AMBULATORY_CARE_PROVIDER_SITE_OTHER)
Admission: RE | Admit: 2019-08-04 | Discharge: 2019-08-04 | Disposition: A | Discharge status: Home / Self Care | Payer: PPO | Source: Ambulatory Visit | Attending: Gastroenterology | Admitting: Gastroenterology

## 2019-08-04 ENCOUNTER — Other Ambulatory Visit: Payer: Self-pay

## 2019-08-04 DIAGNOSIS — T85520S Displacement of bile duct prosthesis, sequela: Secondary | ICD-10-CM | POA: Diagnosis not present

## 2019-08-04 DIAGNOSIS — Z9689 Presence of other specified functional implants: Secondary | ICD-10-CM | POA: Diagnosis not present

## 2019-08-08 ENCOUNTER — Other Ambulatory Visit: Payer: Self-pay

## 2019-08-08 ENCOUNTER — Telehealth (INDEPENDENT_AMBULATORY_CARE_PROVIDER_SITE_OTHER): Payer: PPO | Admitting: Sports Medicine

## 2019-08-08 DIAGNOSIS — K8070 Calculus of gallbladder and bile duct without cholecystitis without obstruction: Secondary | ICD-10-CM

## 2019-08-08 DIAGNOSIS — M7060 Trochanteric bursitis, unspecified hip: Secondary | ICD-10-CM

## 2019-08-08 DIAGNOSIS — M1612 Unilateral primary osteoarthritis, left hip: Secondary | ICD-10-CM

## 2019-08-08 NOTE — Progress Notes (Signed)
Chief comp: Hip bursitis  Patient consents to a telephone visit.  She understands limitations.  Patient at home Physician at [facility] office.   On last visit we prescribed HEP Emphasis on biking to help with hip pain She has been able to back several times since then and feels that hip is much improved On many days she has no hip pain  General health issues have arisen: ED hospitalization for chest/ abdominal pain Patient with recent cholecystectomy Had sxs chest pain to back Vomiting Constipation Weight loss  Surgery 2 weeks ago/ CBD stent in place at time However XR on 12/14 did not show stent On protonix bid On tramadol from surgery Having more problems with constipation  ROS Since surgery much less pain Feels she is able to rest better and then feels less confused in day Appetite starting to return (states she had lost 20 lbs in last several months)

## 2019-08-08 NOTE — Anesthesia Postprocedure Evaluation (Signed)
Anesthesia Post Note  Patient: Carly Rhodes  Procedure(s) Performed: ENDOSCOPIC RETROGRADE CHOLANGIOPANCREATOGRAPHY (ERCP) (N/A ) SPHINCTEROTOMY REMOVAL OF STONES BILIARY DILATION PANCREATIC STENT PLACEMENT     Anesthesia Post Evaluation  Last Vitals:  Vitals:   07/23/19 0300 07/23/19 0758  BP: 122/82 137/81  Pulse: 62 (!) 58  Resp: 20 (!) 21  Temp: 36.7 C 36.6 C  SpO2: 96% 96%    Last Pain:  Vitals:   07/23/19 0758  TempSrc: Axillary  PainSc:                  Macky Galik S

## 2019-08-08 NOTE — Assessment & Plan Note (Signed)
Now doing a home exerc program  This seems to be helping with pain  Key has been to get her to spend more time biking and not as much weight bearing   I spent 26 minutes with this patient.  Telephone visit, chart review and EMR. Over 50% of visit was spend in direct counseling and coordination of care for problems with hip arthritis and pain.

## 2019-08-08 NOTE — Assessment & Plan Note (Signed)
Recovering well post op  Key challenge now is constipation  I suggested increased fluid intake and metamucil may help She has Murelax from surgeons  Avoid tramadol if constipating and try only low does tylenol since pain is mild

## 2019-08-15 ENCOUNTER — Telehealth: Payer: Self-pay

## 2019-08-15 NOTE — Telephone Encounter (Signed)
Please schedule patient for new patient appointment. Thank you!!!

## 2019-08-15 NOTE — Telephone Encounter (Signed)
Yes that is ok

## 2019-08-15 NOTE — Telephone Encounter (Signed)
Ok to schedule as new patient   

## 2019-08-15 NOTE — Telephone Encounter (Signed)
Copied from [place] [id number]. Topic: Appointment Scheduling - Scheduling Inquiry for Clinic >> Aug 15, 2019 11:42 AM Rayann Heman wrote: Reason for CRM: pt called and is requesting that Dr Lorelei Pont accept her as a pt. Pt states that Dr Oneida Alar with [place] referred her. Please advise

## 2019-08-16 ENCOUNTER — Other Ambulatory Visit: Payer: Self-pay | Admitting: Pulmonary Disease

## 2019-08-16 ENCOUNTER — Telehealth: Payer: Self-pay | Admitting: Internal Medicine

## 2019-08-16 MED ORDER — APIXABAN 5 MG PO TABS: 5.0000 mg | ORAL_TABLET | Freq: Two times a day (BID) | ORAL | 5 refills | Status: DC

## 2019-08-16 NOTE — Telephone Encounter (Signed)
73 years old 80kg Scr 0.74 on 07/22/2019 Last OV 01/03/2019

## 2019-08-23 ENCOUNTER — Encounter: Payer: Self-pay | Admitting: Gastroenterology

## 2019-08-23 ENCOUNTER — Other Ambulatory Visit (INDEPENDENT_AMBULATORY_CARE_PROVIDER_SITE_OTHER): Payer: PPO

## 2019-08-23 ENCOUNTER — Ambulatory Visit (INDEPENDENT_AMBULATORY_CARE_PROVIDER_SITE_OTHER): Payer: PPO | Admitting: Gastroenterology

## 2019-08-23 VITALS — BP 124/77 | HR 83 | Temp 98.7°F | Ht 65.0 in | Wt 161.2 lb

## 2019-08-23 DIAGNOSIS — K8050 Calculus of bile duct without cholangitis or cholecystitis without obstruction: Secondary | ICD-10-CM

## 2019-08-23 DIAGNOSIS — R748 Abnormal levels of other serum enzymes: Secondary | ICD-10-CM | POA: Diagnosis not present

## 2019-08-23 LAB — HEPATIC FUNCTION PANEL
ALT: 17 U/L (ref 0–35)
AST: 21 U/L (ref 0–37)
Albumin: 4.3 g/dL (ref 3.5–5.2)
Alkaline Phosphatase: 96 U/L (ref 39–117)
Bilirubin, Direct: 0.1 mg/dL (ref 0.0–0.3)
Total Bilirubin: 0.6 mg/dL (ref 0.2–1.2)
Total Protein: 6.4 g/dL (ref 6.0–8.3)

## 2019-08-23 NOTE — Patient Instructions (Addendum)
Normal BMI (Body Mass Index- based on height and weight) is between 23 and 30. Your BMI today is Body mass index is 26.83 kg/m. Marland Kitchen Please consider follow up  regarding your BMI with your Primary Care Provider.  Please go to the lab in the basement of our building to have lab work done as you leave today. Hit "B" for basement when you get on the elevator.  When the doors open the lab is on your left.  We will call you with the results. Thank you.   Thank you for entrusting me with your care and for choosing [facility], Dr. Pepin Cellar     Due to recent changes in healthcare laws, you may see the results of your imaging and laboratory studies on MyChart before your provider has had a chance to review them.  We understand that in some cases there may be results that are confusing or concerning to you. Not all laboratory results come back in the same time frame and the provider may be waiting for multiple results in order to interpret others.  Please give [place] 48 hours in order for your provider to thoroughly review all the results before contacting the office for clarification of your results.

## 2019-08-23 NOTE — Progress Notes (Signed)
HPI :  74 year old female on Eliquis for atrial fibrillation, here for a follow-up visit following recent hospitalization for choledocholithiasis.  She was admitted in early December with choledocholithiasis.  She had elevations in her liver enzymes and severe abdominal pain.  She was treated with Zosyn initially and ultimately underwent an ERCP with Dr. Rush Landmark on December 4.  She ultimately underwent sphincterotomy with removal of sludge and stones from the duct.  Stent was placed.  She underwent cholecystectomy on December 5 and was eventually discharged in stable condition. She had a follow up x ray 2 weeks later which showed the biliary stent had since passed on its own. Path on the gallbladder c/w chronic cholecystitis.  She is here for follow-up.  She states she has had fatigue and decreased energy since the hospitalization but has been feeling better over the past few days.  She denies any pains when she is eating.  She has been slowly advancing her diet back to normal.  She has some baseline mild constipation, using Metamucil and MiraLAX for this and feels like it is getting better.  She states she feels like she is jaundiced again however her eyes look normal today.  She generally is doing better over the past few weeks.  She has been taking Protonix 40 mg twice a day for the first month post sphincterotomy, Dr. Rush Landmark had recommended another 4 weeks of 40 mg a day as monotherapy  The patient's grandmother had colon cancer but no first-degree relatives of colon cancer noted  Last set of liver enzymes were when hospitalized and AP was 168, ALT 220, AST 98.  Previous Endoscopies:  Colonoscopy  2007 - Dr. Harrell Lark in [facility] found no polyps in 2007.  Colonoscopy 2013 - Dr. Olevia Perches - normal   Past Medical History:  Diagnosis Date  . Anxiety   . Atrial fibrillation ([place])   . Choledocholithiasis   . Depression   . Dysrhythmia   . Family history of anesthesia  complication    sister extreme nausea & vomiting  . 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 Osteoporosis   . Seasonal allergies      Past Surgical History:  Procedure Laterality Date  . ABDOMINAL HYSTERECTOMY  1984  . APPENDECTOMY  1984   at time of Hysterectomy  . BILIARY DILATION  07/21/2019   Procedure: BILIARY DILATION;  Surgeon: Rush Landmark Telford Nab., MD;  Location: [place];  Service: Gastroenterology;;  . CHOLECYSTECTOMY N/A 07/22/2019   Procedure: LAPAROSCOPIC CHOLECYSTECTOMY;  Surgeon: Clovis Riley, MD;  Location: [place];  Service: General;  Laterality: N/A;  . ERCP N/A 07/21/2019   Procedure: ENDOSCOPIC RETROGRADE CHOLANGIOPANCREATOGRAPHY (ERCP);  Surgeon: Irving Copas., MD;  Location: [place];  Service: Gastroenterology;  Laterality: N/A;  . ERCP    . HAMMER TOE SURGERY  1993   2nd toe left foot  . PANCREATIC STENT PLACEMENT  07/21/2019   Procedure: PANCREATIC STENT PLACEMENT;  Surgeon: Rush Landmark Telford Nab., MD;  Location: [place];  Service: Gastroenterology;;  . REMOVAL OF STONES  07/21/2019   Procedure: REMOVAL OF STONES;  Surgeon: Irving Copas., MD;  Location: [place];  Service: Gastroenterology;;  . [name]  07/21/2019   Procedure: [name];  Surgeon: Irving Copas., MD;  Location: [place];  Service: Gastroenterology;;  . THYROID LOBECTOMY  09/15/2012   Procedure: THYROID LOBECTOMY;  Surgeon: Melissa Montane, MD;  Location: [place];  Service: ENT;  Laterality: Left;  . THYROIDECTOMY, PARTIAL  09/15/2012  Dr Janace Hoard  . TONSILLECTOMY  1957   Family History  Problem Relation Age of Onset  . Stomach cancer Brother 63  . Colon cancer Paternal Grandmother 44  . Diabetes Father   . Hypertension Sister   . Heart attack Neg Hx    Social History   Tobacco Use  . Smoking status: Never Smoker  . Smokeless tobacco: Never Used  Substance Use Topics  . Alcohol use: Never  . Drug use: No   Current Outpatient  Medications  Medication Sig Dispense Refill  . acetaminophen (TYLENOL) 500 MG tablet Take 2 tablets (1,000 mg total) by mouth every 6 (six) hours as needed for moderate pain.    Marland Kitchen acetaminophen-codeine (TYLENOL #3) 300-30 MG tablet Take 2 tablets by mouth at bedtime.    Marland Kitchen acyclovir (ZOVIRAX) 400 MG tablet Take 1 tablet (400 mg total) by mouth every 6 (six) hours as needed. 30 tablet 2  . ALPRAZolam (XANAX) 0.5 MG tablet TAKE 1/2 TO 1 (ONE-HALF TO ONE) TABLET BY MOUTH THREE TIMES DAILY AS NEEDED 90 tablet 0  . apixaban (ELIQUIS) 5 MG TABS tablet Take 1 tablet (5 mg total) by mouth 2 (two) times daily. 60 tablet 5  . Ascorbic Acid (VITAMIN C PO) Take 1 tablet by mouth daily.    Marland Kitchen b complex vitamins tablet Take 1 tablet by mouth daily.    . baclofen (LIORESAL) 10 MG tablet Take 1 tablet (10 mg total) by mouth at bedtime. 90 tablet 1  . baclofen (LIORESAL) 10 MG tablet Take 20 mg by mouth at bedtime.    . cyanocobalamin (,VITAMIN B-12,) 1000 MCG/ML injection Inject 1 mL (1,000 mcg total) into the muscle every 30 (thirty) days. 1 mL 11  . ezetimibe (ZETIA) 10 MG tablet Take 1 tablet by mouth once daily 90 tablet 1  . ezetimibe (ZETIA) 10 MG tablet Take 10 mg by mouth daily.    . Multiple Vitamin (MULTIVITAMIN WITH MINERALS) TABS tablet Take 1 tablet by mouth daily. Centrum    . naproxen (NAPROSYN) 500 MG tablet Take 1 tablet (500 mg total) by mouth 2 (two) times daily with a meal. 60 tablet 0  . NEEDLE, DISP, 25 G (BD ECLIPSE NEEDLE) 25G X 5/8" MISC Use for B12 injections 100 each 3  . pantoprazole (PROTONIX) 40 MG tablet Take 1 tablet (40 mg total) by mouth 2 (two) times daily. 60 tablet 3  . traMADol (ULTRAM) 50 MG tablet Take 1 tablet (50 mg total) by mouth every 6 (six) hours as needed for moderate pain. 20 tablet 0  . traZODone (DESYREL) 50 MG tablet Take 50 mg by mouth at bedtime.     No current facility-administered medications for this visit.   Allergies  Allergen Reactions  . Amiodarone  Other (See Comments)    Hair loss  . Procaine Other (See Comments)    REACTION:  Lowers blood pressure, "makes me loopy, can't move or talk"     Review of Systems: All systems reviewed and negative except where noted in HPI.    DG Abd 2 Views  Result Date: 08/04/2019 CLINICAL DATA:  Biliary stent. EXAM: ABDOMEN - 2 VIEW COMPARISON:  None. FINDINGS: The bowel gas pattern is nonobstructive and nonspecific. There is a moderate amount of stool throughout the colon. There is no evidence for a plastic or metallic biliary stent. Surgical clips project over the right upper quadrant. Degenerative changes are noted of the visualized thoracolumbar spine. Multiple phleboliths project over the patient's pelvis. IMPRESSION:  1. No evidence for a plastic or metallic biliary stent. 2. Moderate amount of stool throughout the colon. Electronically Signed   By: Constance Holster M.D.   On: 08/04/2019 19:10    Physical Exam: BP 124/77   Pulse 83   Temp 98.7 F (37.1 C)   Ht 5\' 5"  (1.651 m)   Wt 161 lb 3.2 oz (73.1 kg)   SpO2 98%   BMI 26.83 kg/m  Constitutional: Pleasant,well-developed, female in no acute distress. HEENT: Normocephalic and atraumatic. Conjunctivae are normal. No scleral icterus. Neck supple.  Cardiovascular: Normal rate, regular rhythm.  Pulmonary/chest: Effort normal and breath sounds normal. No wheezing, rales or rhonchi. Abdominal: Soft, nondistended, nontender. here are no masses palpable. No hepatomegaly. Extremities: no edema Lymphadenopathy: No cervical adenopathy noted. Neurological: Alert and oriented to person place and time. Skin: Skin is warm and dry. No rashes noted. Psychiatric: Normal mood and affect. Behavior is normal.   ASSESSMENT AND PLAN: 74 year old female here for reassessment of the following issues:  Choledocholithiasis / elevated liver enzymes - patient is s/p admission for choledocholithiasis in December which led to ERCP and eventual cholecystectomy.   Path was consistent with chronic cholecystitis.  Other than some nonspecific fatigue following her hospitalization she has done pretty well.  She is eating okay, no abdominal pains.  I would like to check her liver enzymes today to make sure they have normalized following these interventions given their last checked while hospitalized and remained abnormal.  I counseled her on cholelithiasis and now that her gallbladder has been removed her risk of recurrence is extremely low and I expect her to do well.  She can follow-up with me as needed moving forward  I spent 20-29 minutes of time, including independent review of results as outlined above, communicating results with the patient directly, face-to-face time with the patient, coordinating care, and ordering studies and medications as appropriate. Greater than 50% of the time was spent counseling and coordinating care.     [place] Cellar, MD [facility]

## 2019-08-28 ENCOUNTER — Telehealth (INDEPENDENT_AMBULATORY_CARE_PROVIDER_SITE_OTHER): Payer: PPO | Admitting: Neurology

## 2019-08-28 ENCOUNTER — Other Ambulatory Visit: Payer: Self-pay

## 2019-08-28 ENCOUNTER — Encounter: Payer: Self-pay | Admitting: Neurology

## 2019-08-28 VITALS — Ht 65.5 in | Wt 155.0 lb

## 2019-08-28 DIAGNOSIS — G3184 Mild cognitive impairment, so stated: Secondary | ICD-10-CM | POA: Diagnosis not present

## 2019-08-28 DIAGNOSIS — F419 Anxiety disorder, unspecified: Secondary | ICD-10-CM

## 2019-08-28 NOTE — Progress Notes (Signed)
Virtual Visit via Telephone Note The purpose of this virtual visit is to provide medical care while limiting exposure to the novel coronavirus.    Consent was obtained for phone visit:  Yes.   Answered questions that patient had about telehealth interaction:  Yes.   I discussed the limitations, risks, security and privacy concerns of performing an evaluation and management service by telephone. I also discussed with the patient that there may be a patient responsible charge related to this service. The patient expressed understanding and agreed to proceed.  Pt location: Home Physician Location: office Name of referring provider:  Aura Dials, MD I connected with .[place] at patients initiation/request on 08/28/2019 at  2:00 PM EST by telephone and verified that I am speaking with the correct person using two identifiers.  Pt MRN:  [id number] Pt DOB:  10/04/1945   History of Present Illness:  The patient had a telephone visi ton 08/28/2019. She was last evaluated 3 months ago also by telephonic communication in the neurology clinic. She is having a lot of trouble thinking, a lot of trouble losing things she was just holding. Sometimes it gets so bad she wonders if she can look after herself. She has missed paying some bills and her house is in horrible shape. She is hoping to move to her old house once work is completed. On her last visit, she was reporting a lot of stress, anxiety, and insomnia caring for her husband. She was started on Trazodone for sleep. She reports her husband passed away in 06-24-2023, she does not have the stress of looking after him. 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 She found that they had taken all her jewelry and her husband's wedding band. She feels that if she had not been so concerned looking  after her husband, she may not have ended up as bad as she was. She was in the hospital in December for gall stones and underwent surgery 6 weeks ago. She is recovering well from the surgery but still cannot eat very much. She is happy to report she has lost 20 lbs.    History on Initial Assessment 08/02/2018: This is a 74 year old right-handed woman with a history of atrial fibrillation on Eliquis, hypothyroidism, anxiety, depression, presenting for evaluation after hospitalization for encephalopathy. She was under the impression that she had a brain infection at that time and wonders is the brain infection is causing her fatigue. Records from her hospitalization in May 2019 were reviewed and it was clarified with the patient that she did not have a brain infection during her hospital admission. She was evaluated by Neurology and had an unremarkable MRI brain, EEG, and lumbar puncture, diagnosis of altered mental status in the setting of high fever, most likely explanation is viral syndrome with delirium with post-viral labyrinthitis. She presented to the hospital for headaches and diffuse weakness leading to a fall. She had not been eating for a few days. Her husband was unable to assist her off the floor. She woke up the next morning with speech difficulty, she could understand people but could not speak. She was febrile on arrival to the ED and was noted to be able to give history but show signs of altered mental status. She was found to have atrial fibrillation with RVR and  started on Eliquis. I personally reviewed MRI brain with and without contrast done 01/13/18 which did not show any acute changes, there was mild diffuse atrophy, mild chronic microvascular disease. Cuts through the IACs showed normal 7th and 8th cranial nerves, no enhancing mass in temporal region. Her EEG was normal. She had a lumbar puncture with CSF showing WBC of 2, RBC 4400 in tub 1, 515 in tube 4, normal protein, glucose, negative  HSV 1 and 2, negative CSF culture. She was treated empirically with acyclovir, ceftriaxone, and ampicillin, which were discontinued when cultures came back negative. During her stay, she developed vertigo and persistent right-beating nystagmus with rotary component in all directions, consistent with labyrinthitis. She described illusions/delusions during her stay.   She denies any further headaches, vertigo, or hallucinations since her hospitalization in May 2019. Her main concern is her balance. She states most of the time it is good, but other times it is so bad, especially in an enclosed area. She has not fallen but has had to hold on to objects. She states most of the time she can correct herself. She denies any focal numbness/tingling/weakness, neck/back pain, bowel/bladder dysfunction. She states she does not have headaches, but a place in the back of her head feels funny at times then hurts, she takes a Tylenol with good effect. She denies any vertigo, diplopia, dysarthria/dysphagia. She has noticed cognitive difficulties since her hospitalization in May. She is the primary caregiver of her 31 year old husband. They have been married for 3 years. She states she looks after him and does everything for him. He has a scooter for transfers, she does not need to help him to get around, but she bathes him once a week. She knows she needs help but does not know how to do it since "everything is so personal." She manages finances and has had more trouble since coming out of the hospital. She manages her husband's medications and states she does not forget them, but sometimes forgets her own medication. If she is unsure if she took the Eliquis, she would not take it that day.     Observations/Objective:  Limited due to nature of phone visit. She is anxious, again verbose and difficult to redirect, similar to prior visits.  Montreal Cognitive Assessment Blind 08/28/2019 03/16/2019  Attention: Read list of digits  (0/2) 1 2  Attention: Read list of letters (0/1) 1 1  Attention: Serial 7 subtraction starting at 100 (0/3) 2 1  Language: Repeat phrase (0/2) 1 2  Language : Fluency (0/1) 0 0  Abstraction (0/2) 0 0  Delayed Recall (0/5) 4 3  Orientation (0/6) 6 6  Total 15 15  Adjusted Score (based on education) - 16     Assessment and Plan:   This is a 74 yo RH woman with a history of atrial fibrillation on Eliquis, hypothyroidism, anxiety, depression, who had encephalopathy in May 2019 diagnosed as altered mental status in the seeing of high fever, most likely viral syndrome with delirium. MRI brain, EEG, and lumbar puncture were unremarkable. She continues to be concerned that she has had brain injury from this event, I discussed unremarkable test results and proceeding with Neurocognitive testing to further evaluate cognitive concerns. MOCA blind score today 15/22. I again discussed with her how stress can cause subjective cognitive issues, her husband has passed and she is now under stress with his children. She declines referral to [facility]. Follow-up in 6 months, she knows to call for any  changes.    Follow Up Instructions:   -I discussed the assessment and treatment plan with the patient. The patient was provided an opportunity to ask questions and all were answered. The patient agreed with the plan and demonstrated an understanding of the instructions.   The patient was advised to call back or seek an in-person evaluation if the symptoms worsen or if the condition fails to improve as anticipated.    Total Time spent in visit with the patient was:  20:14 minutes, of which 100% of the time was spent in counseling and/or coordinating care on the above.   Pt understands and agrees with the plan of care outlined.     Cameron Sprang, MD

## 2019-09-04 ENCOUNTER — Telehealth: Payer: Self-pay | Admitting: Obstetrics and Gynecology

## 2019-09-04 ENCOUNTER — Other Ambulatory Visit: Payer: Self-pay

## 2019-09-04 NOTE — Telephone Encounter (Signed)
Message not needed. °

## 2019-09-06 ENCOUNTER — Encounter: Payer: Self-pay | Admitting: Obstetrics and Gynecology

## 2019-09-06 ENCOUNTER — Other Ambulatory Visit: Payer: Self-pay

## 2019-09-06 ENCOUNTER — Ambulatory Visit (INDEPENDENT_AMBULATORY_CARE_PROVIDER_SITE_OTHER): Payer: PPO | Admitting: Obstetrics and Gynecology

## 2019-09-06 VITALS — BP 110/70 | HR 80 | Temp 96.5°F | Resp 16 | Ht 64.25 in | Wt 158.0 lb

## 2019-09-06 DIAGNOSIS — Z01419 Encounter for gynecological examination (general) (routine) without abnormal findings: Secondary | ICD-10-CM

## 2019-09-06 NOTE — Patient Instructions (Signed)

## 2019-09-06 NOTE — Progress Notes (Signed)
74 y.o. G17P0000 Widowed Caucasian female here for annual exam.    She had a cholecystectomy about 7 weeks ago.  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   Husband passed away in 06/22/23.  She states his children are contesting his will.   She is asking for a letter that she cannot have intercourse due to her medical condition, I.e.prolapse.  She states she is preparing for the worst in the legal proceedings.   Is taking Metamucil and then Miralax.  It is not helping with her bowel function.  She has some fecal urgency but no soiling.  She is able to pass her urine.  She has some urinary urgency.  She does not leak urine with a cough or a laugh.   She feels like her prolapse is worse.  She does not want to do any pessary or surgical care for this.   She is living on her own.   PCP: Aura Dials, MD   No LMP recorded. Patient is postmenopausal.           Sexually active: No.  The current method of family planning is status post hysterectomy.    Exercising: No.  eliptical and stationary bike Smoker:  no  Health Maintenance: Pap: Years ago -- normal per patient History of abnormal Pap:  no MMG: 10-06-18 3D/Neg/density B/BiRads1.   Colonoscopy: 10-02-11 normal--no further testing BMD:10-31-18    Result :Osteopenia TDaP: 01-16-15 Gardasil:   no HIV:06-28-15 Neg Hep C aby: ? Screening Labs:  PCP.  Flu vaccine:  Completed.    reports that she has never smoked. She has never used smokeless tobacco. She reports that she does not drink alcohol or use drugs.  Past Medical History:  Diagnosis Date  . Anxiety   . Atrial fibrillation ([place])   . Choledocholithiasis   . Depression   . Dysrhythmia   . Family history of anesthesia complication    sister extreme nausea & vomiting  . 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 Osteoporosis   . Seasonal allergies     Past Surgical History:  Procedure Laterality Date  . ABDOMINAL HYSTERECTOMY  1984  .  APPENDECTOMY  1984   at time of Hysterectomy  . BILIARY DILATION  07/21/2019   Procedure: BILIARY DILATION;  Surgeon: Rush Landmark Telford Nab., MD;  Location: [place];  Service: Gastroenterology;;  . CHOLECYSTECTOMY N/A 07/22/2019   Procedure: LAPAROSCOPIC CHOLECYSTECTOMY;  Surgeon: Clovis Riley, MD;  Location: [place];  Service: General;  Laterality: N/A;  . ERCP N/A 07/21/2019   Procedure: ENDOSCOPIC RETROGRADE CHOLANGIOPANCREATOGRAPHY (ERCP);  Surgeon: Irving Copas., MD;  Location: [place];  Service: Gastroenterology;  Laterality: N/A;  . ERCP    . HAMMER TOE SURGERY  1993   2nd toe left foot  . PANCREATIC STENT PLACEMENT  07/21/2019   Procedure: PANCREATIC STENT PLACEMENT;  Surgeon: Rush Landmark Telford Nab., MD;  Location: [place];  Service: Gastroenterology;;  . REMOVAL OF STONES  07/21/2019   Procedure: REMOVAL OF STONES;  Surgeon: Irving Copas., MD;  Location: [place];  Service: Gastroenterology;;  . [name]  07/21/2019   Procedure: [name];  Surgeon: Irving Copas., MD;  Location: [place];  Service: Gastroenterology;;  . THYROID LOBECTOMY  09/15/2012   Procedure: THYROID LOBECTOMY;  Surgeon: Melissa Montane, MD;  Location: [place];  Service: ENT;  Laterality: Left;  . THYROIDECTOMY, PARTIAL  09/15/2012   Dr Janace Hoard  . TONSILLECTOMY  1957    Current Outpatient  Medications  Medication Sig Dispense Refill  . acetaminophen (TYLENOL) 500 MG tablet Take 2 tablets (1,000 mg total) by mouth every 6 (six) hours as needed for moderate pain.    Marland Kitchen acyclovir (ZOVIRAX) 400 MG tablet Take 1 tablet (400 mg total) by mouth every 6 (six) hours as needed. 30 tablet 2  . ALPRAZolam (XANAX) 0.5 MG tablet TAKE 1/2 TO 1 (ONE-HALF TO ONE) TABLET BY MOUTH THREE TIMES DAILY AS NEEDED 90 tablet 0  . apixaban (ELIQUIS) 5 MG TABS tablet Take 1 tablet (5 mg total) by mouth 2 (two) times daily. 60 tablet 5  . Ascorbic Acid (VITAMIN C PO) Take 1 tablet by  mouth daily.    . baclofen (LIORESAL) 10 MG tablet Take 1 tablet (10 mg total) by mouth at bedtime. 90 tablet 1  . cyanocobalamin (,VITAMIN B-12,) 1000 MCG/ML injection Inject 1 mL (1,000 mcg total) into the muscle every 30 (thirty) days. 1 mL 11  . ezetimibe (ZETIA) 10 MG tablet Take 1 tablet by mouth once daily 90 tablet 1  . Multiple Vitamin (MULTIVITAMIN WITH MINERALS) TABS tablet Take 1 tablet by mouth daily. Centrum    . NEEDLE, DISP, 25 G (BD ECLIPSE NEEDLE) 25G X 5/8" MISC Use for B12 injections 100 each 3  . traZODone (DESYREL) 50 MG tablet Take 50 mg by mouth at bedtime.    . pantoprazole (PROTONIX) 40 MG tablet Take 1 tablet (40 mg total) by mouth 2 (two) times daily. 60 tablet 3   No current facility-administered medications for this visit.    Family History  Problem Relation Age of Onset  . Stomach cancer Brother 41  . Colon cancer Paternal Grandmother 31  . Diabetes Father   . Hypertension Sister   . Heart attack Neg Hx     Review of Systems  All other systems reviewed and are negative.   Exam:   BP 110/70 (Cuff Size: Large)   Pulse 80   Temp (!) 96.5 F (35.8 C) (Temporal)   Resp 16   Ht 5' 4.25" (1.632 m)   Wt 158 lb (71.7 kg)   BMI 26.91 kg/m     General appearance: alert, cooperative and appears stated age Head: normocephalic, without obvious abnormality, atraumatic Neck: no adenopathy, supple, symmetrical, trachea midline and thyroid normal to inspection and palpation Lungs: clear to auscultation bilaterally Breasts: normal appearance, no masses or tenderness, No nipple retraction or dimpling, No nipple discharge or bleeding, No axillary adenopathy Heart: regular rate and rhythm Abdomen: soft, non-tender; no masses, no organomegaly Extremities: extremities normal, atraumatic, no cyanosis or edema Skin: skin color, texture, turgor normal. No rashes or lesions Lymph nodes: cervical, supraclavicular, and axillary nodes normal. Neurologic: grossly  normal  Pelvic: External genitalia:  no lesions              No abnormal inguinal nodes palpated.              Urethra:  normal appearing urethra with no masses, tenderness or lesions              Bartholins and Skenes: normal                 Vagina: normal appearing vagina with normal color and discharge, no lesions              Cervix: absent.  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              Bimanual Exam:  Uterus:  Absent.               Adnexa: no mass, fullness, tenderness              Rectal exam: Yes.  .  Confirms.              Anus:  normal sphincter tone, no lesions  Chaperone was present for exam.  Assessment:   Well woman visit with normal exam. Status post abdominal hysterectomy with Burch or MMK? Ovaries remain.  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  First degree rectocele. Osteopenia.  On Eliquis.   Plan: Mammogram screening discussed. Self breast awareness reviewed. Pap and HR HPV as above. Guidelines for Calcium, Vitamin D, regular exercise program including cardiovascular and weight bearing exercise. We discussed pessary use, which she declines.  She will call if her has difficulty voiding, has recurrent urinary tract infections, or the prolapse worsens further.  I recommend she does not share her personal medical record.  I reviewed HIPPA.  Follow up annually and prn.   After visit summary provided.

## 2019-09-07 ENCOUNTER — Other Ambulatory Visit: Payer: Self-pay | Admitting: Internal Medicine

## 2019-09-07 DIAGNOSIS — E785 Hyperlipidemia, unspecified: Secondary | ICD-10-CM

## 2019-09-18 DIAGNOSIS — Z23 Encounter for immunization: Secondary | ICD-10-CM | POA: Diagnosis not present

## 2019-09-22 DIAGNOSIS — M1611 Unilateral primary osteoarthritis, right hip: Secondary | ICD-10-CM | POA: Diagnosis not present

## 2019-09-22 DIAGNOSIS — E782 Mixed hyperlipidemia: Secondary | ICD-10-CM | POA: Diagnosis not present

## 2019-09-25 ENCOUNTER — Other Ambulatory Visit: Payer: Self-pay | Admitting: Internal Medicine

## 2019-09-25 DIAGNOSIS — M545 Low back pain, unspecified: Secondary | ICD-10-CM

## 2019-09-26 DIAGNOSIS — H00011 Hordeolum externum right upper eyelid: Secondary | ICD-10-CM | POA: Diagnosis not present

## 2019-09-27 DIAGNOSIS — G479 Sleep disorder, unspecified: Secondary | ICD-10-CM | POA: Diagnosis not present

## 2019-09-27 DIAGNOSIS — Z87898 Personal history of other specified conditions: Secondary | ICD-10-CM | POA: Diagnosis not present

## 2019-09-27 DIAGNOSIS — F419 Anxiety disorder, unspecified: Secondary | ICD-10-CM | POA: Diagnosis not present

## 2019-10-06 ENCOUNTER — Ambulatory Visit: Payer: PPO | Admitting: Neurology

## 2019-10-09 DIAGNOSIS — Z23 Encounter for immunization: Secondary | ICD-10-CM | POA: Diagnosis not present

## 2019-10-12 ENCOUNTER — Telehealth: Payer: Self-pay | Admitting: Family Medicine

## 2019-10-12 NOTE — Telephone Encounter (Signed)
Pt was called and reminded of there appointment 

## 2019-10-13 ENCOUNTER — Ambulatory Visit (INDEPENDENT_AMBULATORY_CARE_PROVIDER_SITE_OTHER): Payer: PPO | Admitting: Nurse Practitioner

## 2019-10-13 ENCOUNTER — Other Ambulatory Visit: Payer: Self-pay

## 2019-10-13 ENCOUNTER — Encounter: Payer: Self-pay | Admitting: Nurse Practitioner

## 2019-10-13 VITALS — BP 126/70 | HR 73 | Temp 98.3°F | Resp 16 | Ht 65.5 in | Wt 154.0 lb

## 2019-10-13 DIAGNOSIS — M545 Low back pain, unspecified: Secondary | ICD-10-CM

## 2019-10-13 DIAGNOSIS — F341 Dysthymic disorder: Secondary | ICD-10-CM | POA: Diagnosis not present

## 2019-10-13 DIAGNOSIS — D696 Thrombocytopenia, unspecified: Secondary | ICD-10-CM | POA: Insufficient documentation

## 2019-10-13 DIAGNOSIS — E785 Hyperlipidemia, unspecified: Secondary | ICD-10-CM

## 2019-10-13 DIAGNOSIS — I482 Chronic atrial fibrillation, unspecified: Secondary | ICD-10-CM | POA: Diagnosis not present

## 2019-10-13 DIAGNOSIS — E538 Deficiency of other specified B group vitamins: Secondary | ICD-10-CM | POA: Diagnosis not present

## 2019-10-13 DIAGNOSIS — F5102 Adjustment insomnia: Secondary | ICD-10-CM

## 2019-10-13 DIAGNOSIS — G3184 Mild cognitive impairment, so stated: Secondary | ICD-10-CM

## 2019-10-13 NOTE — Patient Instructions (Addendum)
COVID-19: How to Protect Yourself and Others Know how it spreads  There is currently no vaccine to prevent coronavirus disease 2019 (COVID-19).  The best way to prevent illness is to avoid being exposed to this virus.  The virus is thought to spread mainly from person-to-person. ? Between people who are in close contact with one another (within about 6 feet). ? Through respiratory droplets produced when an infected person coughs, sneezes or talks. ? These droplets can land in the mouths or noses of people who are nearby or possibly be inhaled into the lungs. ? COVID-19 may be spread by people who are not showing symptoms. Everyone should Clean your hands often  Wash your hands often with soap and water for at least 20 seconds especially after you have been in a public place, or after blowing your nose, coughing, or sneezing.  If soap and water are not readily available, use a hand sanitizer that contains at least 60% alcohol. Cover all surfaces of your hands and rub them together until they feel dry.  Avoid touching your eyes, nose, and mouth with unwashed hands. Avoid close contact  Limit contact with others as much as possible.  Avoid close contact with people who are sick.  Put distance between yourself and other people. ? Remember that some people without symptoms may be able to spread virus. ? This is especially important for people who are at higher risk of getting very GainPain.com.cy Cover your mouth and nose with a mask when around others  You could spread COVID-19 to others even if you do not feel sick.  Everyone should wear a mask in public settings and when around people not living in their household, especially when social distancing is difficult to maintain. ? Masks should not be placed on young children under age 15, anyone who has trouble breathing, or is unconscious, incapacitated or otherwise  unable to remove the mask without assistance.  The mask is meant to protect other people in case you are infected.  Do NOT use a facemask meant for a Dietitian.  Continue to keep about 6 feet between yourself and others. The mask is not a substitute for social distancing. Cover coughs and sneezes  Always cover your mouth and nose with a tissue when you cough or sneeze or use the inside of your elbow.  Throw used tissues in the trash.  Immediately wash your hands with soap and water for at least 20 seconds. If soap and water are not readily available, clean your hands with a hand sanitizer that contains at least 60% alcohol. Clean and disinfect  Clean AND disinfect frequently touched surfaces daily. This includes tables, doorknobs, light switches, countertops, handles, desks, phones, keyboards, toilets, faucets, and sinks. RackRewards.fr  If surfaces are dirty, clean them: Use detergent or soap and water prior to disinfection.  Then, use a household disinfectant. You can see a list of EPA-registered household disinfectants here. michellinders.com 04/19/2019 This information is not intended to replace advice given to you by your health care provider. Make sure you discuss any questions you have with your health care provider. Document Revised: 04/27/2019 Document Reviewed: 02/23/2019 Elsevier Patient Education  Reform for Falls Each year, millions of people have serious injuries from falls. It is important to understand your risk for falling. Talk with your health care provider about your risk and what you can do to lower it. There are actions you can take at home to lower your  risk. If you do have a serious fall, make sure you tell your health care provider. Falling once raises your risk for falling again. How can falls affect me? Serious injuries from falls are common.  These include:  Broken bones, such as hip fractures.  Head injuries, such as traumatic brain injuries (TBI). Fear of falling can also cause you to avoid activities and stay at home. This can make your muscles weaker and actually raise your risk for a fall. What can increase my risk? There are a number of risk factors that increase your risk for falling. The more risk factors you have, the higher your risk for falling. Serious injuries from a fall most often happen to people older than age 77. Children and young adults ages 85-29 are also at higher risk. Common risk factors include:  Weakness in the lower body.  Lack (deficiency) of vitamin D.  Being generally weak or confused due to long-term (chronic) illness.  Dizziness or balance problems.  Poor vision.  Medicines that cause dizziness or drowsiness. These can include medicines for your blood pressure, heart, anxiety, insomnia, or edema, as well as pain medicines and muscle relaxants. Other risk factors include:  Drinking alcohol.  Having had a fall in the past.  Having depression.  Foot pain or improper footwear.  Working at a dangerous job.  Having any of the following in your home: ? Tripping hazards, such as floor clutter or loose rugs. ? Poor lighting. ? Pets or clutter.  Dementia or memory loss. What actions can I take to lower my risk of falling?     Physical activity Maintain physical fitness. Do strength and balance exercises. Consider taking a regular class to build strength and balance. Yoga and tai chi are good options. Vision Have your eyes checked every year and your vision prescription updated as needed. Walking aids and footwear  Wear nonskid shoes. Do not wear high heels.  Do not walk around the house in socks or slippers.  Use a cane or walker as told by your health care provider. Home safety  Attach secure railings on both sides of your stairs.  Install grab bars for your tub, shower, and  toilet. Use a bath mat in your tub or shower.  Use good lighting in all rooms. Keep a flashlight near your bed.  Make sure there is a clear path from your bed to the bathroom. Use night-lights.  Do not use throw rugs. Make sure all carpeting is taped or tacked down securely.  Remove all clutter from walkways and stairways, including extension cords.  Repair uneven or broken steps.  Avoid walking on icy or slippery surfaces. Walk on the grass instead of on icy or slick sidewalks. Where you can, use ice melt to get rid of ice on walkways.  Use a cordless phone. Questions to ask your health care provider  Can you help me check my risk for a fall?  Do any of my medicines make me more likely to fall?  Should I take a vitamin D supplement?  What exercises can I do to improve my strength and balance?  Should I make an appointment to have my vision checked?  Do I need a bone density test to check for weak bones or osteoporosis?  Would it help to use a cane or a walker? Where to find more information  Centers for Disease Control and Prevention, STEADI: http://www.wolf.info/  Community-Based Fall Prevention Programs: http://www.wolf.info/  National Institute on Aging: ToneConnect.com.ee Contact a  health care provider if:  You fall at home.  You are afraid of falling at home.  You feel weak, drowsy, or dizzy. Summary  People 70 and older are at high risk for falling. However, older people are not the only ones injured in falls. Children and young adults have a higher-than-normal risk too.  Talk with your health care provider about your risks for falling and how to lower those risks.  Taking certain precautions at home can lower your risk for falling.  If you fall, always tell your health care provider. This information is not intended to replace advice given to you by your health care provider. Make sure you discuss any questions you have with your health care provider. Document Revised:  02/08/2019 Document Reviewed: 02/08/2019 Elsevier Patient Education  [place]

## 2019-10-13 NOTE — Progress Notes (Signed)
New Patient Office Visit  Subjective:  Patient ID: Carly Rhodes, female    DOB: 08/10/1946  Age: 74 y.o. MRN: [id number]  CC:  Chief Complaint  Patient presents with  . Establish Care    wants to discuss the encephalopathy  . Insomnia    wants xanax for this, says she has been on this since 74 years old     HPI [place] presents to establish care.  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  She admits that she is seeking a new primary care provider because her current provider is only working 2 half days a week.  She has a history of insomnia for which she has used Xanax 0.5 mg for 35 years.  Her husband passed October 2020.  She she feels like the Xanax is very effective for her sleep.  She normally takes 1 tablet at bedtime and if she wakes up at 3 AM think she takes an additional tablet.  She admits that taking 2 tablets at bedtime was less effective. She is status post cholecystectomy.  She also admits that she has had her coronavirus vaccinations. She had a history of encephalitis.  She wonders if she is having some changes since this diagnosis.  She admits that some days are better than others.  She wonders if the encephalitis has decreased her lifespan.  She does currently live alone.  She lives in a two-story home but does have a chair lift for the stairs.  She denies any recent falls or injuries.  She does state in contact with her sister daily.  She has a daughter and grandchildren and [place] for a home she would like to visit.  She admits that she did not visit them during the Christmas holidays because they do not take COVID-19 seriously and they all contracted the virus she is wondering if she is able to visit them now that she has been immunized. She has a  history of atrial fibrillation and does follow-up with cardiology who provides her with Eliquis.  She admits that she does not like taking this however notes that it is important. She has recently followed up with GYN because of her cystocele however she is not interested in any any type of repair.  She admits that she will continue to follow-up.  Past Medical History:  Diagnosis Date  . Anxiety   . Atrial fibrillation ([place])   . Choledocholithiasis   . Depression   . Dysrhythmia   . Family history of anesthesia complication    sister extreme nausea & vomiting  . 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 Osteoporosis   . Seasonal allergies     Past Surgical History:  Procedure Laterality Date  . ABDOMINAL HYSTERECTOMY  1984  . APPENDECTOMY  1984   at time of Hysterectomy  . BILIARY DILATION  07/21/2019   Procedure: BILIARY DILATION;  Surgeon: Rush Landmark Telford Nab., MD;  Location: [facility];  Service: Gastroenterology;;  . CHOLECYSTECTOMY N/A 07/22/2019   Procedure: LAPAROSCOPIC CHOLECYSTECTOMY;  Surgeon: Clovis Riley, MD;  Location: [facility];  Service: General;  Laterality: N/A;  . ERCP N/A 07/21/2019   Procedure: ENDOSCOPIC RETROGRADE CHOLANGIOPANCREATOGRAPHY (ERCP);  Surgeon: Irving Copas., MD;  Location: [facility];  Service: Gastroenterology;  Laterality: N/A;  . ERCP    .  HAMMER TOE SURGERY  1993   2nd toe left foot  . PANCREATIC STENT PLACEMENT  07/21/2019   Procedure: PANCREATIC STENT PLACEMENT;  Surgeon: Rush Landmark Telford Nab., MD;  Location: [facility];  Service: Gastroenterology;;  . REMOVAL OF STONES  07/21/2019   Procedure: REMOVAL OF STONES;  Surgeon: Irving Copas., MD;  Location: [facility];  Service: Gastroenterology;;  . [name]  07/21/2019   Procedure: [name];  Surgeon: Irving Copas., MD;  Location: [facility];  Service: Gastroenterology;;  . THYROID LOBECTOMY  09/15/2012    Procedure: THYROID LOBECTOMY;  Surgeon: Melissa Montane, MD;  Location: [facility];  Service: ENT;  Laterality: Left;  . THYROIDECTOMY, PARTIAL  09/15/2012   Dr Janace Hoard  . TONSILLECTOMY  1957    Family History  Problem Relation Age of Onset  . Stomach cancer Brother 96  . Colon cancer Paternal Grandmother 66  . Diabetes Father   . Hypertension Sister   . Heart attack Neg Hx     Social History   Socioeconomic History  . Marital status: Widowed    Spouse name: Not on file  . Number of children: Not on file  . Years of education: Not on file  . Highest education level: Not on file  Occupational History  . Not on file  Tobacco Use  . Smoking status: Never Smoker  . Smokeless tobacco: Never Used  Substance and Sexual Activity  . Alcohol use: Never  . Drug use: No  . Sexual activity: Not Currently    Birth control/protection: Post-menopausal, Surgical  Other Topics Concern  . 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 Difficulty of Paying Living Expenses: Not on file  Food Insecurity:   . Worried About Charity fundraiser in the Last Year: Not on file  . Ran Out of Food in the Last Year: Not on file  Transportation Needs:   . Lack of Transportation (Medical): Not on file  . Lack of Transportation (Non-Medical): Not on file  Physical Activity:   . Days of Exercise per Week: Not on file  . Minutes of Exercise per Session: Not on file  Stress:   . Feeling of Stress : Not on file  Social Connections:   . Frequency of Communication with Friends and Family: Not on file  . Frequency of Social Gatherings with Friends and Family: Not on file  . Attends Religious Services: Not on file  . Active Member of Clubs or Organizations: Not on  file  . Attends Archivist Meetings: Not on file  . Marital Status: Not on file  Intimate Partner Violence:   . Fear of Current or Ex-Partner: Not on file  . Emotionally Abused: Not on file  . Physically Abused: Not on file  . Sexually Abused: Not on file    ROS Review of Systems  Constitutional: Negative.   HENT: Negative.   Eyes: Negative.   Respiratory: Negative.   Cardiovascular: Negative.   Gastrointestinal: Negative.   Endocrine: Negative.   Genitourinary: Negative.   Musculoskeletal: Positive for arthralgias.       Left hip pain Guarded gait   Skin: Negative.   Allergic/Immunologic: Negative.   Neurological: Negative.  Hematological: Negative.   Psychiatric/Behavioral: The patient is hyperactive.     Objective:   Today's Vitals: BP 126/70 (BP Location: Left Arm, Patient Position: Sitting, Cuff Size: Normal)   Pulse 73   Temp 98.3 F (36.8 C) (Oral)   Resp 16   Ht 5' 5.5" (1.664 m)   Wt 154 lb (69.9 kg)   SpO2 98%   BMI 25.24 kg/m   Physical Exam Constitutional:      Appearance: Normal appearance.  HENT:     Mouth/Throat:     Mouth: Mucous membranes are moist.     Pharynx: Oropharynx is clear.  Cardiovascular:     Rate and Rhythm: Normal rate and regular rhythm.     Pulses: Normal pulses.  Pulmonary:     Effort: Pulmonary effort is normal.     Breath sounds: Normal breath sounds.  Abdominal:     General: Bowel sounds are normal.     Palpations: Abdomen is soft.  Musculoskeletal:        General: Swelling present.     Right hand: Swelling and deformity present.     Left hand: Swelling and deformity present.     Cervical back: Normal range of motion.     Left hip: Bony tenderness present. Decreased range of motion.       Legs:  Skin:    General: Skin is warm and dry.     Capillary Refill: Capillary refill takes less than 2 seconds.  Neurological:     Mental Status: She is alert and oriented to person, place, and time.    Psychiatric:        Attention and Perception: Attention normal.        Mood and Affect: Mood is anxious.        Behavior: Behavior normal. Behavior is cooperative.        Judgment: Judgment normal.     Comments: Very talkative Has moments where she feels forgetful and relates this to the previous encephalitis      Assessment & Plan:   Problem List Items Addressed This Visit      High   Atrial fibrillation, chronic (HCC)   Dyslipidemia     Unprioritized   DYSTHYMIA   Insomnia   LOW BACK PAIN SYNDROME   Mild cognitive impairment   Thrombocytopenia, unspecified (HCC)   Vitamin B 12 deficiency - Primary      Outpatient Encounter Medications as of 10/13/2019  Medication Sig  . acetaminophen (TYLENOL) 500 MG tablet Take 2 tablets (1,000 mg total) by mouth every 6 (six) hours as needed for moderate pain.  Marland Kitchen acyclovir (ZOVIRAX) 400 MG tablet Take 1 tablet (400 mg total) by mouth every 6 (six) hours as needed.  . ALPRAZolam (XANAX) 0.5 MG tablet TAKE 1/2 TO 1 (ONE-HALF TO ONE) TABLET BY MOUTH THREE TIMES DAILY AS NEEDED  . apixaban (ELIQUIS) 5 MG TABS tablet Take 1 tablet (5 mg total) by mouth 2 (two) times daily.  . Ascorbic Acid (VITAMIN C PO) Take 1 tablet by mouth daily.  . baclofen (LIORESAL) 10 MG tablet Take 1 tablet (10 mg total) by mouth at bedtime.  . cyanocobalamin (,VITAMIN B-12,) 1000 MCG/ML injection Inject 1 mL (1,000 mcg total) into the muscle every 30 (thirty) days.  Marland Kitchen ezetimibe (ZETIA) 10 MG tablet Take 1 tablet by mouth once daily  . Multiple Vitamin (MULTIVITAMIN WITH MINERALS) TABS tablet Take 1 tablet by mouth daily. Centrum  . NEEDLE, DISP, 25 G (BD ECLIPSE NEEDLE) 25G  X 5/8" MISC Use for B12 injections  . [DISCONTINUED] traZODone (DESYREL) 50 MG tablet Take 50 mg by mouth at bedtime.  . [DISCONTINUED] pantoprazole (PROTONIX) 40 MG tablet Take 1 tablet (40 mg total) by mouth 2 (two) times daily.   No facility-administered encounter medications on file as of  10/13/2019.    Follow-up: Return in about 5 days (around 10/18/2019) for AND, 6 Week follow up.   Vevelyn Francois, NP

## 2019-10-23 ENCOUNTER — Other Ambulatory Visit: Payer: PPO | Admitting: Nurse Practitioner

## 2019-10-23 ENCOUNTER — Other Ambulatory Visit: Payer: Self-pay | Admitting: Nurse Practitioner

## 2019-10-23 ENCOUNTER — Other Ambulatory Visit: Payer: Self-pay

## 2019-10-23 DIAGNOSIS — I482 Chronic atrial fibrillation, unspecified: Secondary | ICD-10-CM | POA: Diagnosis not present

## 2019-10-23 DIAGNOSIS — M8580 Other specified disorders of bone density and structure, unspecified site: Secondary | ICD-10-CM

## 2019-10-23 DIAGNOSIS — E785 Hyperlipidemia, unspecified: Secondary | ICD-10-CM

## 2019-10-23 DIAGNOSIS — M859 Disorder of bone density and structure, unspecified: Secondary | ICD-10-CM

## 2019-10-23 DIAGNOSIS — R5383 Other fatigue: Secondary | ICD-10-CM | POA: Diagnosis not present

## 2019-10-23 DIAGNOSIS — E538 Deficiency of other specified B group vitamins: Secondary | ICD-10-CM | POA: Diagnosis not present

## 2019-10-23 DIAGNOSIS — Z0000 Encounter for general adult medical examination without abnormal findings: Secondary | ICD-10-CM

## 2019-10-23 NOTE — Addendum Note (Signed)
Addended by: Adelina Mings on: 10/23/2019 09:34 AM   Modules accepted: Orders

## 2019-10-24 ENCOUNTER — Ambulatory Visit: Payer: PPO | Admitting: Sports Medicine

## 2019-10-24 DIAGNOSIS — M7062 Trochanteric bursitis, left hip: Secondary | ICD-10-CM | POA: Diagnosis not present

## 2019-10-24 LAB — COMP. METABOLIC PANEL (12)
AST: 23 IU/L (ref 0–40)
Albumin/Globulin Ratio: 2.2 (ref 1.2–2.2)
Albumin: 4.4 g/dL (ref 3.7–4.7)
Alkaline Phosphatase: 110 IU/L (ref 39–117)
BUN/Creatinine Ratio: 14 (ref 12–28)
BUN: 12 mg/dL (ref 8–27)
Bilirubin Total: 0.8 mg/dL (ref 0.0–1.2)
Calcium: 9.9 mg/dL (ref 8.7–10.3)
Chloride: 105 mmol/L (ref 96–106)
Creatinine, Ser: 0.83 mg/dL (ref 0.57–1.00)
GFR calc Af Amer: 80 mL/min/{1.73_m2} (ref >59)
GFR calc non Af Amer: 69 mL/min/{1.73_m2} (ref >59)
Globulin, Total: 2 g/dL (ref 1.5–4.5)
Glucose: 87 mg/dL (ref 65–99)
Potassium: 4.1 mmol/L (ref 3.5–5.2)
Sodium: 143 mmol/L (ref 134–144)
Total Protein: 6.4 g/dL (ref 6.0–8.5)

## 2019-10-24 LAB — CBC WITH DIFFERENTIAL/PLATELET
Basophils Absolute: 0.1 10*3/uL (ref 0.0–0.2)
Basos: 1 %
EOS (ABSOLUTE): 0.1 10*3/uL (ref 0.0–0.4)
Eos: 2 %
Hematocrit: 44.5 % (ref 34.0–46.6)
Hemoglobin: 14.1 g/dL (ref 11.1–15.9)
Immature Grans (Abs): 0 10*3/uL (ref 0.0–0.1)
Immature Granulocytes: 0 %
Lymphocytes Absolute: 1.9 10*3/uL (ref 0.7–3.1)
Lymphs: 35 %
MCH: 28 pg (ref 26.6–33.0)
MCHC: 31.7 g/dL (ref 31.5–35.7)
MCV: 89 fL (ref 79–97)
Monocytes Absolute: 0.4 10*3/uL (ref 0.1–0.9)
Monocytes: 7 %
Neutrophils Absolute: 3.1 10*3/uL (ref 1.4–7.0)
Neutrophils: 55 %
Platelets: 240 10*3/uL (ref 150–450)
RBC: 5.03 x10E6/uL (ref 3.77–5.28)
RDW: 12.3 % (ref 11.7–15.4)
WBC: 5.5 10*3/uL (ref 3.4–10.8)

## 2019-10-24 LAB — LIPID PANEL
Chol/HDL Ratio: 3.8 ratio (ref 0.0–4.4)
Cholesterol, Total: 173 mg/dL (ref 100–199)
HDL: 46 mg/dL (ref >39)
LDL Chol Calc (NIH): 103 mg/dL — ABNORMAL HIGH (ref 0–99)
Triglycerides: 133 mg/dL (ref 0–149)
VLDL Cholesterol Cal: 24 mg/dL (ref 5–40)

## 2019-10-24 LAB — VITAMIN B12: Vitamin B-12: 705 pg/mL (ref 232–1245)

## 2019-10-24 LAB — TSH: TSH: 1.2 u[IU]/mL (ref 0.450–4.500)

## 2019-10-24 LAB — VITAMIN D 25 HYDROXY (VIT D DEFICIENCY, FRACTURES): Vit D, 25-Hydroxy: 100 ng/mL (ref 30.0–100.0)

## 2019-10-24 LAB — MAGNESIUM: Magnesium: 2.3 mg/dL (ref 1.6–2.3)

## 2019-10-24 MED ORDER — ACETAMINOPHEN-CODEINE #3 300-30 MG PO TABS: ORAL_TABLET | ORAL | 0 refills | Status: DC

## 2019-10-24 NOTE — Progress Notes (Addendum)
    SUBJECTIVE:   CHIEF COMPLAINT: L hip follow up  HPI:   Previously seen 12/22 for virtual visit for L hip bursitis. Advised biking to help with hip pain. 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   Has been biking every day with improvement in pain and mobility. She is unable to sleep on her L side, sleeping on her back with pillow under knees she is able to tolerate. Only taking tylenol about once per week. Takes tylenol with codeine for severe pain only, not taking often but needs refill. States getting up and initial walking feels stiff but improves with movement. Went to PT only once.  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 No tenderness over piriformis. ROM: improved hip ROM, ~40 degree of external hip rotation, ~15 degree of internal hip rotation, ~120 degrees of hip flexion. Strength: 4/5 strength with hip abduction and adduction Neurovascular: intact  ASSESSMENT/PLAN:   Greater trochanteric bursitis of left hip Improved pain and mobility with regular bicycling, recommend daily continuation. Also advised abductor muscle strengthening and hip ROM exercises. Can continue to take tylenol prn for pain, Rx for tylenol with codeine given today. Follow up in 3 months.    Rory Percy, DO PGY-3, [facility] 10/24/2019 11:31 AM   I observed and examined the patient with the resident and agree with assessment and plan.  Note reviewed and modified by me. Ila Mcgill, MD

## 2019-10-24 NOTE — Patient Instructions (Signed)
It was great to see you!  Our plans for today:  - Continue biking. - Do the hip exercises as shown every day. - We sent a refill for the tylenol with codeine to the pharmacy. - Come back in 3 months for follow up.  Take care and seek immediate care sooner if you develop any concerns.

## 2019-10-24 NOTE — Assessment & Plan Note (Signed)
Improved pain and mobility with regular bicycling, recommend daily continuation. Also advised abductor muscle strengthening and hip ROM exercises. Can continue to take tylenol prn for pain, Rx for tylenol with codeine given today. Follow up in 3 months.

## 2019-10-25 ENCOUNTER — Telehealth: Payer: Self-pay | Admitting: Nurse Practitioner

## 2019-10-25 NOTE — Telephone Encounter (Signed)
Called and advised that labs are normal and thyroid is in range. Thanks!

## 2019-10-25 NOTE — Telephone Encounter (Signed)
Pt called about seeing if the blood work she recently received is checking her thyroid. Please call pt back.

## 2019-10-31 ENCOUNTER — Telehealth: Payer: Self-pay

## 2019-10-31 ENCOUNTER — Other Ambulatory Visit: Payer: Self-pay

## 2019-10-31 DIAGNOSIS — E782 Mixed hyperlipidemia: Secondary | ICD-10-CM | POA: Diagnosis not present

## 2019-10-31 DIAGNOSIS — E538 Deficiency of other specified B group vitamins: Secondary | ICD-10-CM

## 2019-10-31 DIAGNOSIS — M1611 Unilateral primary osteoarthritis, right hip: Secondary | ICD-10-CM | POA: Diagnosis not present

## 2019-10-31 MED ORDER — "BD ECLIPSE NEEDLE 25G X 5/8"" MISC": 3 refills | Status: AC

## 2019-10-31 MED ORDER — CYANOCOBALAMIN 1000 MCG/ML IJ SOLN: 1000.0000 ug | INTRAMUSCULAR | 11 refills | Status: DC

## 2019-10-31 NOTE — Telephone Encounter (Signed)
Refill request for xanax. She says she has about 7 days left, she is asking if you can send in towards the end of the week or Monday. Please advise.

## 2019-11-01 ENCOUNTER — Other Ambulatory Visit: Payer: Self-pay | Admitting: Nurse Practitioner

## 2019-11-01 DIAGNOSIS — F341 Dysthymic disorder: Secondary | ICD-10-CM

## 2019-11-01 DIAGNOSIS — M545 Low back pain, unspecified: Secondary | ICD-10-CM

## 2019-11-01 MED ORDER — ALPRAZOLAM 0.5 MG PO TABS: ORAL_TABLET | ORAL | 0 refills | Status: DC

## 2019-11-01 NOTE — Telephone Encounter (Signed)
Sent for Friday

## 2019-11-24 ENCOUNTER — Other Ambulatory Visit: Payer: Self-pay

## 2019-11-24 ENCOUNTER — Encounter: Payer: Self-pay | Admitting: Nurse Practitioner

## 2019-11-24 ENCOUNTER — Ambulatory Visit (INDEPENDENT_AMBULATORY_CARE_PROVIDER_SITE_OTHER): Payer: PPO | Admitting: Nurse Practitioner

## 2019-11-24 VITALS — BP 98/58 | HR 64 | Temp 97.9°F | Resp 14 | Ht 65.5 in | Wt 156.0 lb

## 2019-11-24 DIAGNOSIS — M545 Low back pain, unspecified: Secondary | ICD-10-CM

## 2019-11-24 DIAGNOSIS — L659 Nonscarring hair loss, unspecified: Secondary | ICD-10-CM | POA: Diagnosis not present

## 2019-11-24 DIAGNOSIS — F419 Anxiety disorder, unspecified: Secondary | ICD-10-CM | POA: Diagnosis not present

## 2019-11-24 DIAGNOSIS — F341 Dysthymic disorder: Secondary | ICD-10-CM | POA: Diagnosis not present

## 2019-11-24 DIAGNOSIS — G3184 Mild cognitive impairment, so stated: Secondary | ICD-10-CM

## 2019-11-24 DIAGNOSIS — G4700 Insomnia, unspecified: Secondary | ICD-10-CM | POA: Diagnosis not present

## 2019-11-24 MED ORDER — BACLOFEN 10 MG PO TABS: 10.0000 mg | ORAL_TABLET | Freq: Every day | ORAL | 1 refills | Status: DC

## 2019-11-24 NOTE — Patient Instructions (Signed)
Alopecia Areata, Adult  Alopecia areata is a condition that causes you to lose hair. You may lose hair on your scalp in patches. In some cases, you may lose all the hair on your scalp (alopecia totalis) or all the hair from your face and body (alopecia universalis). Alopecia areata is an autoimmune disease. This means that your body's defense system (immune system) mistakes normal parts of the body for germs or other things that can make you sick. When you have alopecia areata, the immune system attacks the hair follicles. Alopecia areata usually develops in childhood, but it can develop at any age. For some people, their hair grows back on its own and hair loss does not happen again. For others, their hair may fall out and grow back in cycles. The hair loss may last many years. Having this condition can be emotionally difficult, but it is not dangerous. What are the causes? The cause of this condition is not known. What increases the risk? This condition is more likely to develop in people who have:  A family history of alopecia.  A family history of another autoimmune disease, including type 1 diabetes and rheumatoid arthritis.  Asthma and allergies.  Down syndrome. What are the signs or symptoms? Round spots of patchy hair loss on the scalp is the main symptom of this condition. The spots may be mildly itchy. Other symptoms include:  Short dark hairs in the bald patches that are wider at the top (exclamation point hairs).  Dents, white spots, or lines in the fingernails or toenails.  Balding and body hair loss. This is rare. How is this diagnosed? This condition is diagnosed based on your symptoms and family history. Your health care provider will also check your scalp skin, teeth, and nails. Your health care provider may refer you to a specialist in hair and skin disorders (dermatologist). You may also have tests, including:  A hair pull test.  Blood tests or other screening tests  to check for autoimmune diseases, such as thyroid disease or diabetes.  Skin biopsy to confirm the diagnosis.  A procedure to examine the skin with a lighted magnifying instrument (dermoscopy). How is this treated? There is no cure for alopecia areata. Treatment is aimed at promoting the regrowth of hair and preventing the immune system from overreacting. No single treatment is right for all people with alopecia areata. It depends on the type of hair loss you have and how severe it is. Work with your health care provider to find the best treatment for you. Treatment may include:  Having regular checkups to make sure the condition is not getting worse (watchful waiting).  Steroid creams or pills for 6-8 weeks to stop the immune reaction and help hair to regrow more quickly.  Other topical medicines to alter the immune system response and support the hair growth cycle.  Steroid injections.  Therapy and counseling with a support group or therapist if you are having trouble coping with hair loss. Follow these instructions at home:  Learn as much as you can about your condition.  Apply topical creams only as told by your health care provider.  Take over-the-counter and prescription medicines only as told by your health care provider.  Consider getting a wig or products to make hair look fuller or to cover bald spots, if you feel uncomfortable with your appearance.  Get therapy or counseling if you are having a hard time coping with hair loss. Ask your health care provider to recommend  a counselor or support group.  Keep all follow-up visits as told by your health care provider. This is important. Contact a health care provider if:  Your hair loss gets worse, even with treatment.  You have new symptoms.  You are struggling emotionally. Summary  Alopecia areata is an autoimmune condition that makes your body's defense system (immune system) attack the hair follicles. This causes you  to lose hair.  Treatments may include regular checkups to make sure that the condition is not getting worse (watchful waiting), medicines, and steroid injections. This information is not intended to replace advice given to you by your health care provider. Make sure you discuss any questions you have with your health care provider. Document Revised: 07/16/2017 Document Reviewed: 08/21/2016 Elsevier Patient Education  [employer]. Insomnia Insomnia is a sleep disorder that makes it difficult to fall asleep or stay asleep. Insomnia can cause fatigue, low energy, difficulty concentrating, mood swings, and poor performance at work or school. There are three different ways to classify insomnia:  Difficulty falling asleep.  Difficulty staying asleep.  Waking up too early in the morning. Any type of insomnia can be long-term (chronic) or short-term (acute). Both are common. Short-term insomnia usually lasts for three months or less. Chronic insomnia occurs at least three times a week for longer than three months. What are the causes? Insomnia may be caused by another condition, situation, or substance, such as:  Anxiety.  Certain medicines.  Gastroesophageal reflux disease (GERD) or other gastrointestinal conditions.  Asthma or other breathing conditions.  Restless legs syndrome, sleep apnea, or other sleep disorders.  Chronic pain.  Menopause.  Stroke.  Abuse of alcohol, tobacco, or illegal drugs.  Mental health conditions, such as depression.  Caffeine.  Neurological disorders, such as Alzheimer's disease.  An overactive thyroid (hyperthyroidism). Sometimes, the cause of insomnia may not be known. What increases the risk? Risk factors for insomnia include:  Gender. Women are affected more often than men.  Age. Insomnia is more common as you get older.  Stress.  Lack of exercise.  Irregular work schedule or working night shifts.  Traveling between different  time zones.  Certain medical and mental health conditions. What are the signs or symptoms? If you have insomnia, the main symptom is having trouble falling asleep or having trouble staying asleep. This may lead to other symptoms, such as:  Feeling fatigued or having low energy.  Feeling nervous about going to sleep.  Not feeling rested in the morning.  Having trouble concentrating.  Feeling irritable, anxious, or depressed. How is this diagnosed? This condition may be diagnosed based on:  Your symptoms and medical history. Your health care provider may ask about: ? Your sleep habits. ? Any medical conditions you have. ? Your mental health.  A physical exam. How is this treated? Treatment for insomnia depends on the cause. Treatment may focus on treating an underlying condition that is causing insomnia. Treatment may also include:  Medicines to help you sleep.  Counseling or therapy.  Lifestyle adjustments to help you sleep better. Follow these instructions at home: Eating and drinking   Limit or avoid alcohol, caffeinated beverages, and cigarettes, especially close to bedtime. These can disrupt your sleep.  Do not eat a large meal or eat spicy foods right before bedtime. This can lead to digestive discomfort that can make it hard for you to sleep. Sleep habits   Keep a sleep diary to help you and your health care provider figure out  what could be causing your insomnia. Write down: ? When you sleep. ? When you wake up during the night. ? How well you sleep. ? How rested you feel the next day. ? Any side effects of medicines you are taking. ? What you eat and drink.  Make your bedroom a dark, comfortable place where it is easy to fall asleep. ? Put up shades or blackout curtains to block light from outside. ? Use a white noise machine to block noise. ? Keep the temperature cool.  Limit screen use before bedtime. This includes: ? Watching TV. ? Using your  smartphone, tablet, or computer.  Stick to a routine that includes going to bed and waking up at the same times every day and night. This can help you fall asleep faster. Consider making a quiet activity, such as reading, part of your nighttime routine.  Try to avoid taking naps during the day so that you sleep better at night.  Get out of bed if you are still awake after 15 minutes of trying to sleep. Keep the lights down, but try reading or doing a quiet activity. When you feel sleepy, go back to bed. General instructions  Take over-the-counter and prescription medicines only as told by your health care provider.  Exercise regularly, as told by your health care provider. Avoid exercise starting several hours before bedtime.  Use relaxation techniques to manage stress. Ask your health care provider to suggest some techniques that may work well for you. These may include: ? Breathing exercises. ? Routines to release muscle tension. ? Visualizing peaceful scenes.  Make sure that you drive carefully. Avoid driving if you feel very sleepy.  Keep all follow-up visits as told by your health care provider. This is important. Contact a health care provider if:  You are tired throughout the day.  You have trouble in your daily routine due to sleepiness.  You continue to have sleep problems, or your sleep problems get worse. Get help right away if:  You have serious thoughts about hurting yourself or someone else. If you ever feel like you may hurt yourself or others, or have thoughts about taking your own life, get help right away. You can go to your nearest emergency department or call:  Your local emergency services (911 in the [place]  A suicide crisis helpline, such as the [facility] at 9847998791. This is open 24 hours a day. Summary  Insomnia is a sleep disorder that makes it difficult to fall asleep or stay asleep.  Insomnia can be long-term  (chronic) or short-term (acute).  Treatment for insomnia depends on the cause. Treatment may focus on treating an underlying condition that is causing insomnia.  Keep a sleep diary to help you and your health care provider figure out what could be causing your insomnia. This information is not intended to replace advice given to you by your health care provider. Make sure you discuss any questions you have with your health care provider. Document Revised: 07/16/2017 Document Reviewed: 05/13/2017 Elsevier Patient Education  2020 [employer].

## 2019-11-24 NOTE — Progress Notes (Signed)
[place],   [address] Phone:  [phone]   Fax:  [phone]   Established Patient Office Visit  Subjective:  Patient ID: Carly Rhodes, female    DOB: 09/20/1945  Age: 74 y.o. MRN: [id number]  CC:  Chief Complaint  Patient presents with  . Follow-up    6 weeks follow up   . Anxiety  . Insomnia    discuss anything to help her sleep   . Alopecia    HPI [name] presents for follow up. 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   She admits that she sleeps from 12pm  To 7 . She took xanax and APAP with codeine.   Subjective:     Carly Rhodes is a 74 y.o. female who complains of insomnia. Onset was several months ago. Patient describes symptoms as frequent night time awakening. Patient has found minimal relief with prescription sleep aid, Xanax and Tylenol with codeine. Associated symptoms include: anxiety. Patient denies daytime somnolence, depression, fatigue, frequent nighttime urination, irritability, leg cramps and restless legs. Symptoms have gradually worsened.    Past Medical History:  Diagnosis Date  . Anxiety   . Atrial fibrillation ([place])   . Choledocholithiasis   . Depression   . Dysrhythmia   . Family history of anesthesia complication    sister extreme nausea & vomiting  . 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 Osteoporosis   . Seasonal allergies     Past Surgical History:  Procedure Laterality Date  . ABDOMINAL HYSTERECTOMY  1984  . APPENDECTOMY  1984   at time of Hysterectomy  . BILIARY DILATION  07/21/2019   Procedure: BILIARY DILATION;  Surgeon: Rush Landmark Telford Nab., MD;  Location: [facility];  Service: Gastroenterology;;  .  CHOLECYSTECTOMY N/A 07/22/2019   Procedure: LAPAROSCOPIC CHOLECYSTECTOMY;  Surgeon: Clovis Riley, MD;  Location: [facility];  Service: General;  Laterality: N/A;  . ERCP N/A 07/21/2019   Procedure: ENDOSCOPIC RETROGRADE CHOLANGIOPANCREATOGRAPHY (ERCP);  Surgeon: Irving Copas., MD;  Location: [facility];  Service: Gastroenterology;  Laterality: N/A;  . ERCP    . HAMMER TOE SURGERY  1993   2nd toe left foot  . PANCREATIC STENT PLACEMENT  07/21/2019   Procedure: PANCREATIC STENT PLACEMENT;  Surgeon: Rush Landmark Telford Nab., MD;  Location: [facility];  Service: Gastroenterology;;  . REMOVAL OF STONES  07/21/2019   Procedure: REMOVAL OF STONES;  Surgeon: Irving Copas., MD;  Location: [facility];  Service: Gastroenterology;;  . [name]  07/21/2019   Procedure: [name];  Surgeon: Irving Copas., MD;  Location: [facility];  Service: Gastroenterology;;  . THYROID LOBECTOMY  09/15/2012   Procedure: THYROID LOBECTOMY;  Surgeon: Melissa Montane, MD;  Location: [facility];  Service: ENT;  Laterality: Left;  . THYROIDECTOMY, PARTIAL  09/15/2012   Dr Janace Hoard  . TONSILLECTOMY  1957    Family History  Problem Relation Age of Onset  . Stomach cancer Brother 95  . Colon cancer Paternal Grandmother 11  . Diabetes Father   . Hypertension Sister   . Heart attack Neg Hx     Social History   Socioeconomic History  . Marital status: Widowed    Spouse name: Not on file  . Number of children: Not on file  . Years  of education: Not on file  . Highest education level: Not on file  Occupational History  . Not on file  Tobacco Use  . Smoking status: Never Smoker  . Smokeless tobacco: Never Used  Substance and Sexual Activity  . Alcohol use: Never  . Drug use: No  . Sexual activity: Not Currently    Birth control/protection: Post-menopausal, Surgical  Other Topics Concern  . 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 Difficulty of Paying Living Expenses:   Food Insecurity:   . Worried About Charity fundraiser in the Last Year:   . Arboriculturist in the Last Year:   Transportation Needs:   . Film/video editor (Medical):   Marland Kitchen Lack of Transportation (Non-Medical):   Physical Activity:   . Days of Exercise per Week:   . Minutes of Exercise per Session:   Stress:   . Feeling of Stress :   Social Connections:   . Frequency of Communication with Friends and Family:   . Frequency of Social Gatherings with Friends and Family:   . Attends Religious Services:   . Active Member of Clubs or Organizations:   . Attends Archivist Meetings:   Marland Kitchen Marital Status:   Intimate Partner Violence:   . Fear of Current or Ex-Partner:   . Emotionally Abused:   Marland Kitchen Physically Abused:   . Sexually Abused:     Outpatient Medications Prior to Visit  Medication Sig Dispense Refill  . acetaminophen (TYLENOL) 500 MG tablet Take 2 tablets (1,000 mg total) by mouth every 6 (six) hours as needed for moderate pain.    Marland Kitchen acetaminophen-codeine (TYLENOL #3) 300-30 MG tablet Take one tab at bedtime as needed 30 tablet 0  . acyclovir (ZOVIRAX) 400 MG tablet Take 1 tablet (400 mg total) by mouth every 6 (six) hours as needed. 30 tablet 2  . ALPRAZolam (XANAX) 0.5 MG tablet TAKE 1/2 TO 1 (ONE-HALF TO ONE) TABLET BY MOUTH THREE TIMES DAILY AS NEEDED 60 tablet 0  . apixaban (ELIQUIS) 5 MG TABS tablet Take 1 tablet (5 mg total) by mouth 2 (two) times daily. 60 tablet 5  . Ascorbic Acid (VITAMIN C PO) Take 1 tablet by mouth daily.    . cyanocobalamin (,VITAMIN B-12,) 1000 MCG/ML injection Inject 1 mL (1,000 mcg total) into the muscle every 30 (thirty) days. 1 mL 11  . ezetimibe (ZETIA) 10 MG tablet Take 1 tablet by mouth  once daily 90 tablet 0  . Multiple Vitamin (MULTIVITAMIN WITH MINERALS) TABS tablet Take 1 tablet by mouth daily. Centrum    . Multiple Vitamins-Minerals (HAIR SKIN AND NAILS FORMULA) TABS Take by mouth.    Marland Kitchen NEEDLE, DISP, 25 G (BD ECLIPSE NEEDLE) 25G X 5/8" MISC Use for B12 injections 100 each 3  . baclofen (LIORESAL) 10 MG tablet Take 1 tablet (10 mg total) by mouth at bedtime. 90 tablet 1   No facility-administered medications prior to visit.    Allergies  Allergen Reactions  . Amiodarone Other (See Comments)    Hair loss  . Procaine Other (See Comments)    REACTION:  Lowers blood pressure, "makes me loopy, can't move or  talk"    ROS Review of Systems  All other systems reviewed and are negative.     Objective:    Physical Exam  Constitutional: She is oriented to person, place, and time. She appears well-developed and well-nourished. No distress.  HENT:  Head: Normocephalic.  Eyes: Pupils are equal, round, and reactive to light.  Cardiovascular: Normal rate, regular rhythm, normal heart sounds and intact distal pulses.  Pulmonary/Chest: Effort normal and breath sounds normal.  Abdominal: Soft. Bowel sounds are normal.  Musculoskeletal:        General: Normal range of motion.     Cervical back: Normal range of motion and neck supple.  Neurological: She is alert and oriented to person, place, and time.  Skin: Skin is warm and dry.  Psychiatric: She has a normal mood and affect. Her behavior is normal. Judgment and thought content normal.    BP (!) 98/58 (BP Location: Right Arm, Patient Position: Sitting, Cuff Size: Normal) Comment: manually  Pulse 64   Temp 97.9 F (36.6 C) (Oral)   Resp 14   Ht 5' 5.5" (1.664 m)   Wt 156 lb (70.8 kg)   SpO2 99%   BMI 25.56 kg/m  Wt Readings from Last 3 Encounters:  11/24/19 156 lb (70.8 kg)  10/24/19 152 lb (68.9 kg)  10/13/19 154 lb (69.9 kg)     There are no preventive care reminders to display for this patient.  There  are no preventive care reminders to display for this patient.  Lab Results  Component Value Date   TSH 1.200 10/23/2019   Lab Results  Component Value Date   WBC 5.5 10/23/2019   HGB 14.1 10/23/2019   HCT 44.5 10/23/2019   MCV 89 10/23/2019   PLT 240 10/23/2019   Lab Results  Component Value Date   NA 143 10/23/2019   K 4.1 10/23/2019   CO2 23 07/22/2019   GLUCOSE 87 10/23/2019   BUN 12 10/23/2019   CREATININE 0.83 10/23/2019   BILITOT 0.8 10/23/2019   ALKPHOS 110 10/23/2019   AST 23 10/23/2019   ALT 17 08/23/2019   PROT 6.4 10/23/2019   ALBUMIN 4.4 10/23/2019   CALCIUM 9.9 10/23/2019   ANIONGAP 10 07/22/2019   GFR 64.48 11/02/2018   Lab Results  Component Value Date   CHOL 173 10/23/2019   Lab Results  Component Value Date   HDL 46 10/23/2019   Lab Results  Component Value Date   LDLCALC 103 (H) 10/23/2019   Lab Results  Component Value Date   TRIG 133 10/23/2019   Lab Results  Component Value Date   CHOLHDL 3.8 10/23/2019   No results found for: HGBA1C    Assessment & Plan:   Problem List Items Addressed This Visit      Unprioritized   DYSTHYMIA   Insomnia - Primary   LOW BACK PAIN SYNDROME   Relevant Medications   baclofen (LIORESAL) 10 MG tablet   Mild cognitive impairment    Other Visit Diagnoses    Anxiety       We will continue with Xanax refill in a few weeks   Alopecia       Okay to restart hair skin nail vitamin      Meds ordered this encounter  Medications  . baclofen (LIORESAL) 10 MG tablet    Sig: Take 1 tablet (10 mg total) by mouth at bedtime.    Dispense:  90 tablet    Refill:  1  Order Specific Question:   Supervising Provider    Answer:   Tresa Garter [id number]    Follow-up: Return in about 3 months (around 02/23/2020).    Vevelyn Francois, NP

## 2019-11-27 ENCOUNTER — Other Ambulatory Visit: Payer: Self-pay | Admitting: Nurse Practitioner

## 2019-11-27 DIAGNOSIS — M545 Low back pain, unspecified: Secondary | ICD-10-CM

## 2019-11-27 DIAGNOSIS — F341 Dysthymic disorder: Secondary | ICD-10-CM

## 2019-11-27 NOTE — Telephone Encounter (Signed)
Refill on xanax. Please advise.

## 2019-12-07 ENCOUNTER — Other Ambulatory Visit: Payer: Self-pay

## 2019-12-07 ENCOUNTER — Ambulatory Visit (INDEPENDENT_AMBULATORY_CARE_PROVIDER_SITE_OTHER): Payer: PPO | Admitting: Sports Medicine

## 2019-12-07 DIAGNOSIS — R269 Unspecified abnormalities of gait and mobility: Secondary | ICD-10-CM | POA: Diagnosis not present

## 2019-12-07 DIAGNOSIS — M2010 Hallux valgus (acquired), unspecified foot: Secondary | ICD-10-CM | POA: Diagnosis not present

## 2019-12-07 DIAGNOSIS — M21619 Bunion of unspecified foot: Secondary | ICD-10-CM | POA: Diagnosis not present

## 2019-12-07 NOTE — Assessment & Plan Note (Signed)
Given additional orthotics today She is not sure that the current orthotic materials are as comfortable They do correct her pronation  Use these for 1 to 2 mos and we will adjust if needed  Gait improved

## 2019-12-07 NOTE — Assessment & Plan Note (Signed)
These are stable and less painful when using orthoitcs  Continue to use orthotic correction in all shoes

## 2019-12-07 NOTE — Progress Notes (Signed)
CC;  Bilateral foot pain  Patient put into custom orthotics for pronated gait and bunions causing bilateral foot pain 01/2019.  Since then much less foot pain.  States she won't walk very far without using them or she gets foot and leg pain.  Doing more walking and work in garden Wants more orthoiics to use in [employer]  Ros Hip pain improved overall Hips ache if she stands or walks without arch support    Gen: older F in NAD BP 122/78   Ht 5' 5.5" (1.664 m)   Wt 152 lb (68.9 kg)   BMI 24.91 kg/m    RT foot Breakdown of medial column Long arch collapse Large bunion Some flattening of transverse arch  LT Foot Bunion Long arch collapse Mild transverse arch flattening  Static and dynamic pronation on gait  Patient was fitted for a : standard, cushioned, semi-rigid orthotic. The orthotic was heated and afterward the patient stood on the orthotic blank positioned on the orthotic stand. The patient was positioned in subtalar neutral position and 10 degrees of ankle dorsiflexion in a weight bearing stance. After completion of molding, a stable base was applied to the orthotic blank. The blank was ground to a stable position for weight bearing. Size: 10 Black FAST support tech Base: [employer] med. density Posting: forefoot padding Additional orthotic padding:

## 2019-12-15 ENCOUNTER — Other Ambulatory Visit: Payer: Self-pay | Admitting: Internal Medicine

## 2019-12-18 ENCOUNTER — Other Ambulatory Visit: Payer: Self-pay

## 2019-12-18 NOTE — Telephone Encounter (Signed)
Pt last saw Dr Lovena Le 01/03/19 telemedicine Covid-19, last labs 10/23/19 Creat 0.83, age 74, weight 68.9kg, based on specified criteria pt is on appropriate dosage of Eliquis 5mg  BID.  Will refill rx.

## 2019-12-19 ENCOUNTER — Encounter: Payer: PPO | Admitting: Sports Medicine

## 2019-12-25 ENCOUNTER — Other Ambulatory Visit: Payer: Self-pay

## 2019-12-25 DIAGNOSIS — E785 Hyperlipidemia, unspecified: Secondary | ICD-10-CM

## 2019-12-25 MED ORDER — EZETIMIBE 10 MG PO TABS: 10.0000 mg | ORAL_TABLET | Freq: Every day | ORAL | 1 refills | Status: DC

## 2019-12-27 ENCOUNTER — Telehealth: Payer: Self-pay | Admitting: Family Medicine

## 2019-12-27 ENCOUNTER — Other Ambulatory Visit: Payer: Self-pay | Admitting: Nurse Practitioner

## 2019-12-27 DIAGNOSIS — E785 Hyperlipidemia, unspecified: Secondary | ICD-10-CM

## 2019-12-27 MED ORDER — EZETIMIBE 10 MG PO TABS: 10.0000 mg | ORAL_TABLET | Freq: Every day | ORAL | 1 refills | Status: DC

## 2019-12-27 NOTE — Telephone Encounter (Signed)
Done

## 2019-12-27 NOTE — Telephone Encounter (Signed)
sent 

## 2019-12-29 ENCOUNTER — Other Ambulatory Visit: Payer: Self-pay | Admitting: Nurse Practitioner

## 2019-12-29 DIAGNOSIS — F341 Dysthymic disorder: Secondary | ICD-10-CM

## 2019-12-29 DIAGNOSIS — M545 Low back pain, unspecified: Secondary | ICD-10-CM

## 2019-12-29 MED ORDER — ALPRAZOLAM 0.5 MG PO TABS: ORAL_TABLET | ORAL | 0 refills | Status: DC

## 2019-12-29 NOTE — Telephone Encounter (Signed)
Is this okay to refill? 

## 2020-01-11 ENCOUNTER — Encounter: Payer: PPO | Admitting: Sports Medicine

## 2020-01-12 ENCOUNTER — Ambulatory Visit: Payer: PPO | Admitting: Physician Assistant

## 2020-01-12 NOTE — Progress Notes (Deleted)
Cardiology Office Note Date:  01/12/2020  Patient ID:  Carly, Rhodes 04/02/1946, MRN [id number] PCP:  Vevelyn Francois, NP  Cardiologist:  ***  ***refresh   Chief Complaint: *** annual visit  History of Present Illness: Carly Rhodes is a 74 y.o. 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 Lovena Le, last seen by him 01/03/19 via tele health, doing well, no changes were made. Older notes report  " seen in the hospital when she had presented with AMS in the setting of an acute febrile illness, was found to have atrial fib with a RVR, placed initially on beta blockers where she had a long pause and switched to low dose amiodarone"   Dec 2020 hospitalized had lap-chole  *** eliquis, bleeding, labs *** burden *** meds *** labs, lipids... *** thyroid? *** follows with Dr. Oneida Alar, ortho, [place] with improvement in hip pain   Past Medical History:  Diagnosis Date  . Anxiety   . Atrial fibrillation ([place])   . Choledocholithiasis   . Depression   . Dysrhythmia   . Family history of anesthesia complication    sister extreme nausea & vomiting  . 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 Osteoporosis   . Seasonal allergies     Past Surgical History:  Procedure Laterality Date  . ABDOMINAL HYSTERECTOMY  1984  . APPENDECTOMY  1984   at time of Hysterectomy  . BILIARY DILATION  07/21/2019   Procedure: BILIARY DILATION;  Surgeon: Rush Landmark Telford Nab., MD;  Location: [facility];  Service: Gastroenterology;;  . CHOLECYSTECTOMY N/A 07/22/2019   Procedure: LAPAROSCOPIC CHOLECYSTECTOMY;  Surgeon: Clovis Riley, MD;  Location: [facility];  Service: General;  Laterality: N/A;  . ERCP N/A 07/21/2019   Procedure: ENDOSCOPIC RETROGRADE CHOLANGIOPANCREATOGRAPHY (ERCP);  Surgeon: Irving Copas., MD;  Location: [facility];  Service: Gastroenterology;  Laterality: N/A;  . ERCP    . HAMMER TOE SURGERY  1993   2nd toe left foot  . PANCREATIC STENT PLACEMENT  07/21/2019   Procedure: PANCREATIC STENT PLACEMENT;  Surgeon: Rush Landmark Telford Nab., MD;  Location: [facility];  Service: Gastroenterology;;  . REMOVAL OF STONES  07/21/2019   Procedure: REMOVAL OF STONES;  Surgeon: Irving Copas., MD;  Location: [facility];  Service: Gastroenterology;;  . [name]  07/21/2019   Procedure: [name];  Surgeon: Irving Copas., MD;  Location: [facility];  Service: Gastroenterology;;  . THYROID LOBECTOMY  09/15/2012   Procedure: THYROID LOBECTOMY;  Surgeon: Melissa Montane, MD;  Location: [facility];  Service: ENT;  Laterality: Left;  . THYROIDECTOMY, PARTIAL  09/15/2012   Dr Janace Hoard  . TONSILLECTOMY  1957    Current Outpatient Medications  Medication Sig Dispense Refill  . acetaminophen (TYLENOL) 500 MG tablet Take 2 tablets (1,000 mg total) by mouth every 6 (six) hours as needed for moderate pain.    Marland Kitchen acetaminophen-codeine (TYLENOL #3) 300-30 MG tablet Take one tab at bedtime as needed 30 tablet 0  . acyclovir (ZOVIRAX) 400 MG tablet Take 1 tablet (400 mg total) by mouth every 6 (six) hours as needed. 30 tablet 2  . ALPRAZolam (XANAX) 0.5 MG tablet 1/2 to one tablet BID prn. Must last 30 days 60 tablet 0  . Ascorbic Acid (VITAMIN C PO) Take 1 tablet by mouth daily.    . baclofen (LIORESAL)  10 MG tablet Take 1 tablet (10 mg total) by mouth at bedtime. 90 tablet 1  . cyanocobalamin (,VITAMIN B-12,) 1000 MCG/ML injection Inject 1 mL (1,000 mcg total) into the muscle every 30 (thirty) days. 1 mL 11  . ELIQUIS 5 MG TABS tablet Take 1 tablet by mouth twice daily 60 tablet 5  . ezetimibe (ZETIA) 10 MG tablet Take 1 tablet (10 mg total) by mouth daily. 90 tablet 1  . Multiple Vitamin (MULTIVITAMIN WITH MINERALS) TABS tablet Take 1 tablet by mouth daily. Centrum    . Multiple Vitamins-Minerals (HAIR  SKIN AND NAILS FORMULA) TABS Take by mouth.    Marland Kitchen NEEDLE, DISP, 25 G (BD ECLIPSE NEEDLE) 25G X 5/8" MISC Use for B12 injections 100 each 3   No current facility-administered medications for this visit.    Allergies:   Amiodarone and Procaine   Social History:  The patient  reports that she has never smoked. She has never used smokeless tobacco. She reports that she does not drink alcohol or use drugs.   Family History:  The patient's family history includes Colon cancer (age of onset: 20) in her paternal grandmother; Diabetes in her father; Hypertension in her sister; Stomach cancer (age of onset: 56) in her brother.  ROS:  Please see the history of present illness.  All other systems are reviewed and otherwise negative.   PHYSICAL EXAM: *** VS:  There were no vitals taken for this visit. BMI: There is no height or weight on file to calculate BMI. Well nourished, well developed, in no acute distress  HEENT: normocephalic, atraumatic  Neck: no JVD, carotid bruits or masses Cardiac:  *** RRR; no significant murmurs, no rubs, or gallops Lungs:  ** CTA b/l no wheezing, rhonchi or rales  Abd: soft, nontender MS: no deformity or *** atrophy Ext: *** no edema  Skin: warm and dry, no rash Neuro:  No gross deficits appreciated Psych: euthymic mood, full affect    EKG:  Not done today   01/11/2018: TTE Study Conclusions  - Left ventricle: The cavity size was normal. Systolic function was  normal. The estimated ejection fraction was in the range of 55%  to 60%. Wall motion was normal; there were no regional wall  motion abnormalities.  - Aortic valve: Transvalvular velocity was within the normal range.  There was no stenosis. There was no regurgitation. Valve area  (VTI): 1.97 cm^2. Valve area (Vmax): 2.23 cm^2. Valve area  (Vmean): 2.19 cm^2.  - Mitral valve: Transvalvular velocity was within the normal range.  There was no evidence for stenosis. There was mild  regurgitation.  Valve area by pressure half-time: 1.26 cm^2.  - Left atrium: The atrium was severely dilated.  - Right ventricle: The cavity size was normal. Wall thickness was  normal. Systolic function was normal.  - Tricuspid valve: There was trivial regurgitation.  - Pulmonary arteries: Systolic pressure was within the normal  range. PA peak pressure: 21 mm Hg (S).     Recent Labs: 08/23/2019: ALT 17 10/23/2019: BUN 12; Creatinine, Ser 0.83; Hemoglobin 14.1; Magnesium 2.3; Platelets 240; Potassium 4.1; Sodium 143; TSH 1.200  10/23/2019: Chol/HDL Ratio 3.8; Cholesterol, Total 173; HDL 46; LDL Chol Calc (NIH) 103; Triglycerides 133   CrCl cannot be calculated (Patient's most recent lab result is older than the maximum 21 days allowed.).   Wt Readings from Last 3 Encounters:  12/07/19 152 lb (68.9 kg)  11/24/19 156 lb (70.8 kg)  10/24/19 152 lb (68.9 kg)  Other studies reviewed: Additional studies/records reviewed today include: summarized above  ASSESSMENT AND PLAN:  1. Paroxysmal Afib     CHA2DS2Vasc is 3, on Eliquis, *** appropriately dosed     ***     Disposition: F/u with ***  Current medicines are reviewed at length with the patient today.  The patient did not have any concerns regarding medicines.***  Signed, Tommye Standard, PA-C 01/12/2020 5:07 AM     [facility] [address] [place] [address] [phone] (office)  [phone] (fax)

## 2020-01-17 ENCOUNTER — Other Ambulatory Visit: Payer: Self-pay

## 2020-01-17 ENCOUNTER — Encounter: Payer: Self-pay | Admitting: Nurse Practitioner

## 2020-01-17 ENCOUNTER — Ambulatory Visit (INDEPENDENT_AMBULATORY_CARE_PROVIDER_SITE_OTHER): Payer: PPO | Admitting: Nurse Practitioner

## 2020-01-17 ENCOUNTER — Telehealth: Payer: Self-pay | Admitting: Neurology

## 2020-01-17 VITALS — Ht 65.5 in | Wt 156.0 lb

## 2020-01-17 DIAGNOSIS — G3184 Mild cognitive impairment, so stated: Secondary | ICD-10-CM

## 2020-01-17 DIAGNOSIS — G4700 Insomnia, unspecified: Secondary | ICD-10-CM | POA: Diagnosis not present

## 2020-01-17 DIAGNOSIS — F419 Anxiety disorder, unspecified: Secondary | ICD-10-CM | POA: Diagnosis not present

## 2020-01-17 NOTE — Telephone Encounter (Signed)
Patient's provider that prescribed baclofen 10mg  is no longer practicing. Before retiring he told her she could try taking 2 pills at night since it was such a low dosage. States it was the first time she slept well and didn't have nightmares. Now that he has retired, patient is wondering if Dr. [name] would be able to write this prescription for her? Please call.

## 2020-01-17 NOTE — Telephone Encounter (Signed)
Pt called and informed that her PCP needs to fill her Baclofen for her

## 2020-01-17 NOTE — Progress Notes (Signed)
° °  [place], [place]  [address] Phone:  [phone]   Fax:  [phone] Virtual Visit via Telephone Note  I connected with Sherman on 01/17/20 at  1:20 PM EDT by telephone and verified that I am speaking with the correct person using two identifiers.   I discussed the limitations, risks, security and privacy concerns of performing an evaluation and management service by telephone and the availability of in person appointments. I also discussed with the patient that there may be a patient responsible charge related to this service. The patient expressed understanding and agreed to proceed.   History of Present Illness: Subjective:     Carly Rhodes is a 74 y.o. female who complains of insomnia. Onset was many years ago. Patient describes symptoms as frequent night time awakening and night mares. Patient has found complete relief with prescription sleep aid, Alprazolam and baclofen.  She also gets occasional pain medication for pain flare ups. Associated symptoms include: anxiety.  She does have some issues with stress related related to her husband's death and some unresolved family issues.  Patient denies leg cramps, restless legs and snoring. Symptoms have waxed and waned but are worse overall.  The following portions of the patient's history were reviewed and updated as appropriate: allergies, current medications, past family history, past medical history, past social history, past surgical history and problem list.  Review of Systems Pertinent items are noted in HPI.    Objective:    No exam performed today, televisit.    Assessment:    Insomnia associated with generalized anxiety    Plan:    Discussed sleep hygiene measures including regular sleep schedule, optimal sleep environment, and relaxing presleep rituals. Avoid daytime naps. Avoid caffeine after noon. Avoid excess alcohol. Avoid tobacco. Recommended daily  exercise. Short course of benzodiazepine therapy. Follow up in As scheduled 02/23/20 or sooner if not improving.   Referral to sleep center for further evaluation.   I discussed the assessment and treatment plan with the patient. The patient was provided an opportunity to ask questions and all were answered. The patient agreed with the plan and demonstrated an understanding of the instructions.   The patient was advised to call back or seek an in-person evaluation if the symptoms worsen or if the condition fails to improve as anticipated.  I provided 13 minutes of non-face-to-face time during this encounter.   Vevelyn Francois, NP

## 2020-01-17 NOTE — Telephone Encounter (Signed)
Sorry, she will have to ask her PCP for this.

## 2020-01-17 NOTE — Patient Instructions (Signed)

## 2020-01-17 NOTE — Telephone Encounter (Signed)
Pt called voice mail full  

## 2020-01-18 ENCOUNTER — Other Ambulatory Visit: Payer: Self-pay | Admitting: *Deleted

## 2020-01-18 DIAGNOSIS — M545 Low back pain, unspecified: Secondary | ICD-10-CM

## 2020-01-18 MED ORDER — BACLOFEN 10 MG PO TABS: ORAL_TABLET | ORAL | 0 refills | Status: DC

## 2020-01-21 NOTE — Progress Notes (Signed)
Cardiology Office Note Date:  01/24/2020  Patient ID:  Carly, Rhodes 10/19/1945, MRN [id number] PCP:  Vevelyn Francois, NP  EP: Dr. Lovena Le    Chief Complaint:  annual visit  History of Present Illness: Carly Rhodes is a 74 y.o. 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 Lovena Le, last seen by him 01/03/19 via tele health, doing well, no changes were made. Older notes report  " seen in the hospital when she had presented with AMS in the setting of an acute febrile illness, was found to have atrial fib with a RVR, placed initially on beta blockers where she had a long pause and switched to low dose amiodarone"  Dec 2020 hospitalized had lap-chole  Cardiac wise, sounds like she is doing OK.  She is a little hard to keep on topic.  Mentions that since her encephalitis a couple years back she has never felt the same, like she always has a bit of "brain fog". No CP, palpitations or cardiac awareness. Denies SOB, DOE No near syncope or syncope No bleeding or signs of bleeding  Her husband passed away July 05, 2019 and this has been very difficult.  She says he was the love of her life and does not think she will ever be the same. Discussed perhaps grief counseling of she feels like she need.  She did not seem to feel like that was necessary.   Past Medical History:  Diagnosis Date  . Anxiety   . Atrial fibrillation ([place])   . Choledocholithiasis   . Depression   . Dysrhythmia   . Family history of anesthesia complication    sister extreme nausea & vomiting  . 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 Osteoporosis   . Seasonal allergies     Past Surgical History:  Procedure Laterality Date  . ABDOMINAL HYSTERECTOMY  1984  . APPENDECTOMY  1984   at time of Hysterectomy  . BILIARY DILATION  07/21/2019   Procedure:  BILIARY DILATION;  Surgeon: Rush Landmark Telford Nab., MD;  Location: [facility];  Service: Gastroenterology;;  . CHOLECYSTECTOMY N/A 07/22/2019   Procedure: LAPAROSCOPIC CHOLECYSTECTOMY;  Surgeon: Clovis Riley, MD;  Location: [facility];  Service: General;  Laterality: N/A;  . ERCP N/A 07/21/2019   Procedure: ENDOSCOPIC RETROGRADE CHOLANGIOPANCREATOGRAPHY (ERCP);  Surgeon: Irving Copas., MD;  Location: [facility];  Service: Gastroenterology;  Laterality: N/A;  . ERCP    . HAMMER TOE SURGERY  1993   2nd toe left foot  . PANCREATIC STENT PLACEMENT  07/21/2019   Procedure: PANCREATIC STENT PLACEMENT;  Surgeon: Rush Landmark Telford Nab., MD;  Location: [facility];  Service: Gastroenterology;;  . REMOVAL OF STONES  07/21/2019   Procedure: REMOVAL OF STONES;  Surgeon: Irving Copas., MD;  Location: [facility];  Service: Gastroenterology;;  . [name]  07/21/2019   Procedure: [name];  Surgeon: Irving Copas., MD;  Location: [facility];  Service: Gastroenterology;;  . THYROID LOBECTOMY  09/15/2012   Procedure: THYROID LOBECTOMY;  Surgeon: Melissa Montane, MD;  Location: [facility];  Service: ENT;  Laterality: Left;  . THYROIDECTOMY, PARTIAL  09/15/2012   Dr Janace Hoard  . TONSILLECTOMY  1957    Current Outpatient Medications  Medication Sig Dispense Refill  . acetaminophen (TYLENOL) 500 MG tablet Take 2 tablets (1,000 mg total) by mouth every 6 (  six) hours as needed for moderate pain.    Marland Kitchen acyclovir (ZOVIRAX) 400 MG tablet Take 1 tablet (400 mg total) by mouth every 6 (six) hours as needed. 30 tablet 2  . ALPRAZolam (XANAX) 0.5 MG tablet 1/2 to one tablet BID prn. Must last 30 days (Patient taking differently: 1/2 to one tablet BID prn. Must last 30 days pt is taking 1 tablet) 60 tablet 0  . Ascorbic Acid (VITAMIN C PO) Take 1 tablet by mouth daily.    . baclofen (LIORESAL) 10 MG tablet Take 2 tabs at bedtime 60 tablet 0  . cyanocobalamin (,VITAMIN B-12,) 1000 MCG/ML  injection Inject 1 mL (1,000 mcg total) into the muscle every 30 (thirty) days. 1 mL 11  . ELIQUIS 5 MG TABS tablet Take 1 tablet by mouth twice daily 60 tablet 5  . ezetimibe (ZETIA) 10 MG tablet Take 1 tablet (10 mg total) by mouth daily. 90 tablet 1  . Multiple Vitamin (MULTIVITAMIN WITH MINERALS) TABS tablet Take 1 tablet by mouth daily. Centrum    . NEEDLE, DISP, 25 G (BD ECLIPSE NEEDLE) 25G X 5/8" MISC Use for B12 injections 100 each 3  . traZODone (DESYREL) 50 MG tablet Take 50 mg by mouth at bedtime.     No current facility-administered medications for this visit.    Allergies:   Amiodarone and Procaine   Social History:  The patient  reports that she has never smoked. She has never used smokeless tobacco. She reports that she does not drink alcohol or use drugs.   Family History:  The patient's family history includes Colon cancer (age of onset: 64) in her paternal grandmother; Diabetes in her father; Hypertension in her sister; Stomach cancer (age of onset: 27) in her brother.  ROS:  Please see the history of present illness.  All other systems are reviewed and otherwise negative.   PHYSICAL EXAM:  VS:  BP 126/62   Pulse 63   Ht 5' 5.5" (1.664 m)   Wt 149 lb (67.6 kg)   SpO2 96%   BMI 24.42 kg/m  BMI: Body mass index is 24.42 kg/m. Well nourished, well developed, in no acute distress  HEENT: normocephalic, atraumatic  Neck: no JVD, carotid bruits or masses Cardiac:  RRR; no significant murmurs, no rubs, or gallops Lungs: CTA b/l no wheezing, rhonchi or rales  Abd: soft, nontender MS: no deformity, age appropriate atrophy Ext: no edema  Skin: warm and dry, no rash Neuro:  No gross deficits appreciated Psych: euthymic mood, full affect    EKG:  Not done today   01/11/2018: TTE Study Conclusions  - Left ventricle: The cavity size was normal. Systolic function was  normal. The estimated ejection fraction was in the range of 55%  to 60%. Wall motion was  normal; there were no regional wall  motion abnormalities.  - Aortic valve: Transvalvular velocity was within the normal range.  There was no stenosis. There was no regurgitation. Valve area  (VTI): 1.97 cm^2. Valve area (Vmax): 2.23 cm^2. Valve area  (Vmean): 2.19 cm^2.  - Mitral valve: Transvalvular velocity was within the normal range.  There was no evidence for stenosis. There was mild regurgitation.  Valve area by pressure half-time: 1.26 cm^2.  - Left atrium: The atrium was severely dilated.  - Right ventricle: The cavity size was normal. Wall thickness was  normal. Systolic function was normal.  - Tricuspid valve: There was trivial regurgitation.  - Pulmonary arteries: Systolic pressure was within the normal  range. PA peak pressure: 21 mm Hg (S).     Recent Labs: 08/23/2019: ALT 17 10/23/2019: BUN 12; Creatinine, Ser 0.83; Hemoglobin 14.1; Magnesium 2.3; Platelets 240; Potassium 4.1; Sodium 143; TSH 1.200  10/23/2019: Chol/HDL Ratio 3.8; Cholesterol, Total 173; HDL 46; LDL Chol Calc (NIH) 103; Triglycerides 133   CrCl cannot be calculated (Patient's most recent lab result is older than the maximum 21 days allowed.).   Wt Readings from Last 3 Encounters:  01/24/20 149 lb (67.6 kg)  01/23/20 158 lb (71.7 kg)  01/17/20 156 lb (70.8 kg)     Other studies reviewed: Additional studies/records reviewed today include: summarized above  ASSESSMENT AND PLAN:  1. Paroxysmal Afib     CHA2DS2Vasc is 3, on Eliquis, appropriately dosed     Labs march look ok     Low burden based on lack of symptoms  She asks about coming off eliquis Discussed AFib and her stroke risk, recommend she stay on it, she is agreeable.   She would like to change PMD, she is given information for [facility] as well as [facility]     Disposition: F/u with [place] in 33mo, sooner if needed    Current medicines are reviewed at length with the patient today.  The patient did not  have any concerns regarding medicines.  Venetia Night, PA-C 01/24/2020 10:21 AM     [address] [address] [phone] (office)  [phone] (fax)

## 2020-01-23 ENCOUNTER — Ambulatory Visit: Payer: PPO | Admitting: Sports Medicine

## 2020-01-23 ENCOUNTER — Other Ambulatory Visit: Payer: Self-pay

## 2020-01-23 DIAGNOSIS — R269 Unspecified abnormalities of gait and mobility: Secondary | ICD-10-CM | POA: Diagnosis not present

## 2020-01-24 ENCOUNTER — Ambulatory Visit: Payer: PPO | Admitting: Physician Assistant

## 2020-01-24 VITALS — BP 126/62 | HR 63 | Ht 65.5 in | Wt 149.0 lb

## 2020-01-24 DIAGNOSIS — I480 Paroxysmal atrial fibrillation: Secondary | ICD-10-CM | POA: Diagnosis not present

## 2020-01-24 NOTE — Patient Instructions (Signed)
Medication Instructions:  Your physician recommends that you continue on your current medications as directed. Please refer to the Current Medication list given to you today.  *If you need a refill on your cardiac medications before your next appointment, please call your pharmacy*   Lab Work: ,[place]   If you have labs (blood work) drawn today and your tests are completely normal, you will receive your results only by: Marland Kitchen MyChart Message (if you have MyChart) OR . A paper copy in the mail If you have any lab test that is abnormal or we need to change your treatment, we will call you to review the results.   Testing/Procedures: NONE ORDERED  TODAY    Follow-Up: At [facility], you and your health needs are our priority.  As part of our continuing mission to provide you with exceptional heart care, we have created designated Provider Care Teams.  These Care Teams include your primary Cardiologist (physician) and Advanced Practice Providers (APPs -  Physician Assistants and Nurse Practitioners) who all work together to provide you with the care you need, when you need it.  We recommend signing up for the patient portal called "MyChart".  Sign up information is provided on this After Visit Summary.  MyChart is used to connect with patients for Virtual Visits (Telemedicine).  Patients are able to view lab/test results, encounter notes, upcoming appointments, etc.  Non-urgent messages can be sent to your provider as well.   To learn more about what you can do with MyChart, go to [email or web address].    Your next appointment:   6 month(s)  The format for your next appointment:   In Person  Provider:   You may see Dr Lovena Le  or one of the following Advanced Practice Providers on your designated Care Team:    Chanetta Marshall, NP  Tommye Standard, PA-C  Legrand Como "[place], [name]   Other Instructions

## 2020-01-24 NOTE — Progress Notes (Signed)
CC: Return for additonal orthotics  Patient with hallux valgus and abnormal gait. Orthoitcs have helped with almost all of her lower extremity orthopedic pain issues Tries to walk some daily but cannot do so without using orthoitcs.  Recent orthoitcs made have helped She feels that some additonal arch support would help.  Exam See prior note Hallux valgus as noted Loss of long. Arch Gait is pronated.

## 2020-01-24 NOTE — Assessment & Plan Note (Signed)
Patient was fitted for a : standard, cushioned, semi-rigid orthotic. The orthotic was heated and afterward the patient stood on the orthotic blank positioned on the orthotic stand. The patient was positioned in subtalar neutral position and 10 degrees of ankle dorsiflexion in a weight bearing stance. After completion of molding, a stable base was applied to the orthotic blank. The blank was ground to a stable position for weight bearing. Size: 10 Red Med. EVA Base: Blue med. EVA Posting: small scaphoid pads added bilat Additional orthotic padding: none  AT completion of orthoitics she felt good support and comfort Pronation controlled well

## 2020-01-31 ENCOUNTER — Other Ambulatory Visit: Payer: Self-pay

## 2020-01-31 DIAGNOSIS — M545 Low back pain, unspecified: Secondary | ICD-10-CM

## 2020-01-31 DIAGNOSIS — F341 Dysthymic disorder: Secondary | ICD-10-CM

## 2020-02-01 DIAGNOSIS — H00015 Hordeolum externum left lower eyelid: Secondary | ICD-10-CM | POA: Diagnosis not present

## 2020-02-02 ENCOUNTER — Other Ambulatory Visit: Payer: Self-pay | Admitting: Nurse Practitioner

## 2020-02-02 ENCOUNTER — Telehealth: Payer: Self-pay | Admitting: Nurse Practitioner

## 2020-02-02 DIAGNOSIS — F341 Dysthymic disorder: Secondary | ICD-10-CM

## 2020-02-02 DIAGNOSIS — M545 Low back pain, unspecified: Secondary | ICD-10-CM

## 2020-02-02 MED ORDER — ALPRAZOLAM 0.5 MG PO TABS: ORAL_TABLET | ORAL | 0 refills | Status: DC

## 2020-02-02 NOTE — Telephone Encounter (Signed)
Patient medicine was sent to the pharmacy

## 2020-02-23 ENCOUNTER — Telehealth: Payer: Self-pay | Admitting: Internal Medicine

## 2020-02-23 ENCOUNTER — Ambulatory Visit (INDEPENDENT_AMBULATORY_CARE_PROVIDER_SITE_OTHER): Payer: PPO | Admitting: Nurse Practitioner

## 2020-02-23 ENCOUNTER — Telehealth: Payer: Self-pay | Admitting: Nurse Practitioner

## 2020-02-23 ENCOUNTER — Other Ambulatory Visit: Payer: Self-pay

## 2020-02-23 ENCOUNTER — Encounter: Payer: Self-pay | Admitting: Nurse Practitioner

## 2020-02-23 VITALS — BP 125/76 | HR 69 | Temp 97.9°F | Ht 65.5 in | Wt 145.0 lb

## 2020-02-23 DIAGNOSIS — G4700 Insomnia, unspecified: Secondary | ICD-10-CM

## 2020-02-23 DIAGNOSIS — F515 Nightmare disorder: Secondary | ICD-10-CM

## 2020-02-23 DIAGNOSIS — F419 Anxiety disorder, unspecified: Secondary | ICD-10-CM

## 2020-02-23 DIAGNOSIS — G3184 Mild cognitive impairment, so stated: Secondary | ICD-10-CM | POA: Diagnosis not present

## 2020-02-23 NOTE — Telephone Encounter (Signed)
Please forward to [place]. This patient has been dismissed.

## 2020-02-23 NOTE — Telephone Encounter (Signed)
Unfortunately I am not taking new patients at this time but other providers and offices are that may be able to help her.

## 2020-02-23 NOTE — Progress Notes (Signed)
[place], [place]  [address] Phone:  [phone]   Fax:  [phone]   Established Patient Office Visit  Subjective:  Patient ID: Carly Rhodes, female    DOB: 04/05/1946  Age: 74 y.o. MRN: [id number]  CC:  Chief Complaint  Patient presents with   Follow-up    Was told by her Cardiologist that she overuses Xanax.    Medication Dose Change    requesting increase in Baclofen    HPI [place] presents for follow up. 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   Carly Rhodes came in today very upset; asking if I place in her chart that she was overusing Xanax. She admits that this was told to her by her cardiologist then begin to call out other doctors who may have placed this in her chart. She also continues to ask for an increase in Baclofen. She admits that she has a refill from her orthopedist. She is asking to speak with the head doctor over the [facility] [place]. She would like to speak with him.    Past Medical History:  Diagnosis Date   Anxiety    Atrial fibrillation ([place])    Choledocholithiasis    Depression    Dysrhythmia    Family history of anesthesia complication    sister extreme nausea & vomiting   Hepatitis 1953   "contagious type"   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   Osteoporosis    Seasonal allergies     Past Surgical History:  Procedure Laterality Date   ABDOMINAL HYSTERECTOMY  1984   APPENDECTOMY  1984   at time of Hysterectomy   BILIARY DILATION  07/21/2019   Procedure: BILIARY DILATION;  Surgeon: Irving Copas., MD;  Location: [facility];  Service: Gastroenterology;;   CHOLECYSTECTOMY N/A 07/22/2019   Procedure: LAPAROSCOPIC CHOLECYSTECTOMY;  Surgeon: Clovis Riley, MD;  Location: [facility];  Service: General;  Laterality: N/A;   ERCP N/A 07/21/2019   Procedure: ENDOSCOPIC RETROGRADE CHOLANGIOPANCREATOGRAPHY (ERCP);  Surgeon: Irving Copas., MD;  Location: [facility];  Service: Gastroenterology;  Laterality: N/A;   ERCP     HAMMER TOE SURGERY  1993   2nd toe left foot   PANCREATIC STENT PLACEMENT  07/21/2019   Procedure: PANCREATIC STENT PLACEMENT;  Surgeon: Rush Landmark Telford Nab., MD;  Location: [facility];  Service: Gastroenterology;;   REMOVAL OF STONES  07/21/2019   Procedure: REMOVAL OF STONES;  Surgeon: Irving Copas., MD;  Location: [facility];  Service: Gastroenterology;;   [name]  07/21/2019   Procedure: [name];  Surgeon: Mansouraty, Telford Nab., MD;  Location: [facility];  Service: Gastroenterology;;   THYROID LOBECTOMY  09/15/2012   Procedure: THYROID LOBECTOMY;  Surgeon: Melissa Montane, MD;  Location: [facility];  Service: ENT;  Laterality: Left;   THYROIDECTOMY, PARTIAL  09/15/2012   Dr Janace Hoard   TONSILLECTOMY  1957    Family History  Problem Relation Age of Onset   Stomach cancer Brother 62   Colon cancer Paternal Grandmother 45   Diabetes Father    Hypertension Sister    Heart attack Neg Hx     Social History   Socioeconomic History   Marital status: Widowed    Spouse name: Not on file   Number of children: Not on file   Years of education: Not  on file   Highest education level: Not on file  Occupational History   Not on file  Tobacco Use   Smoking status: Never Smoker   Smokeless tobacco: Never Used  Vaping Use   Vaping Use: Never used  Substance and Sexual Activity   Alcohol use: Never   Drug use: No   Sexual activity: Not Currently    Birth control/protection: Post-menopausal, Surgical  Other Topics Concern   Not on file  Social History Narrative   ** Merged History Encounter **       Pt lives in 2 story home with her husband Has 2 adult children High  school graduate 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  Right handed   Social Determinants of Health   Financial Resource Strain:    Difficulty of Paying Living Expenses:   Food Insecurity:    Worried About Charity fundraiser in the Last Year:    Arboriculturist in the Last Year:   Transportation Needs:    Film/video editor (Medical):    Lack of Transportation (Non-Medical):   Physical Activity:    Days of Exercise per Week:    Minutes of Exercise per Session:   Stress:    Feeling of Stress :   Social Connections:    Frequency of Communication with Friends and Family:    Frequency of Social Gatherings with Friends and Family:    Attends Religious Services:    Active Member of Clubs or Organizations:    Attends Archivist Meetings:    Marital Status:   Intimate Partner Violence:    Fear of Current or Ex-Partner:    Emotionally Abused:    Physically Abused:    Sexually Abused:     Outpatient Medications Prior to Visit  Medication Sig Dispense Refill   acetaminophen (TYLENOL) 500 MG tablet Take 2 tablets (1,000 mg total) by mouth every 6 (six) hours as needed for moderate pain.     acyclovir (ZOVIRAX) 400 MG tablet Take 1 tablet (400 mg total) by mouth every 6 (six) hours as needed. 30 tablet 2   ALPRAZolam (XANAX) 0.5 MG tablet 1/2 to one tablet BID prn. Must last 30 days 60 tablet 0   Ascorbic Acid (VITAMIN C PO) Take 1 tablet by mouth daily.     baclofen (LIORESAL) 10 MG tablet Take 2 tabs at bedtime 60 tablet 0   cyanocobalamin (,VITAMIN B-12,) 1000 MCG/ML injection Inject 1 mL (1,000 mcg total) into the muscle every 30 (thirty) days. 1 mL 11   ELIQUIS 5 MG TABS tablet Take 1 tablet by mouth twice daily 60 tablet 5   ezetimibe (ZETIA) 10 MG tablet Take 1 tablet (10 mg total) by mouth daily. 90 tablet 1   Multiple Vitamin (MULTIVITAMIN WITH MINERALS) TABS tablet Take 1 tablet by mouth daily. Centrum     NEEDLE,  DISP, 25 G (BD ECLIPSE NEEDLE) 25G X 5/8" MISC Use for B12 injections 100 each 3   traZODone (DESYREL) 50 MG tablet Take 50 mg by mouth at bedtime.     No facility-administered medications prior to visit.    Allergies  Allergen Reactions   Amiodarone Other (See Comments)    Hair loss   Procaine Other (See Comments)    REACTION:  Lowers blood pressure, "makes me loopy, can't move or talk"    ROS Review of Systems  All other systems reviewed and are negative.     Objective:    Physical  Exam Constitutional:      General: She is not in acute distress.    Comments: Refusing to wear her mask  HENT:     Head: Normocephalic.     Nose: Nose normal.     Mouth/Throat:     Mouth: Mucous membranes are moist.  Cardiovascular:     Rate and Rhythm: Normal rate and regular rhythm.     Pulses: Normal pulses.     Heart sounds: Normal heart sounds.  Pulmonary:     Effort: Pulmonary effort is normal.     Breath sounds: Normal breath sounds.  Abdominal:     General: Bowel sounds are normal.  Musculoskeletal:        General: Normal range of motion.     Cervical back: Normal range of motion.  Skin:    General: Skin is warm and dry.     Capillary Refill: Capillary refill takes less than 2 seconds.  Neurological:     Mental Status: She is alert.     Comments: Oriented to person, place and time.  However the patient did show up at the office on yesterday for this apt.  Psychiatric:     Comments: Upset Unable to remember some events Drug seeking behavior      BP 125/76 (BP Location: Left Arm, Patient Position: Sitting, Cuff Size: Small)    Pulse 69    Temp 97.9 F (36.6 C)    Ht 5' 5.5" (1.664 m)    Wt 145 lb (65.8 kg)    SpO2 98%    BMI 23.76 kg/m  Wt Readings from Last 3 Encounters:  02/23/20 145 lb (65.8 kg)  01/24/20 149 lb (67.6 kg)  01/23/20 158 lb (71.7 kg)     There are no preventive care reminders to display for this patient.  There are no preventive care  reminders to display for this patient.  Lab Results  Component Value Date   TSH 1.200 10/23/2019   Lab Results  Component Value Date   WBC 5.5 10/23/2019   HGB 14.1 10/23/2019   HCT 44.5 10/23/2019   MCV 89 10/23/2019   PLT 240 10/23/2019   Lab Results  Component Value Date   NA 143 10/23/2019   K 4.1 10/23/2019   CO2 23 07/22/2019   GLUCOSE 87 10/23/2019   BUN 12 10/23/2019   CREATININE 0.83 10/23/2019   BILITOT 0.8 10/23/2019   ALKPHOS 110 10/23/2019   AST 23 10/23/2019   ALT 17 08/23/2019   PROT 6.4 10/23/2019   ALBUMIN 4.4 10/23/2019   CALCIUM 9.9 10/23/2019   ANIONGAP 10 07/22/2019   GFR 64.48 11/02/2018   Lab Results  Component Value Date   CHOL 173 10/23/2019   Lab Results  Component Value Date   HDL 46 10/23/2019   Lab Results  Component Value Date   LDLCALC 103 (H) 10/23/2019   Lab Results  Component Value Date   TRIG 133 10/23/2019   Lab Results  Component Value Date   CHOLHDL 3.8 10/23/2019   No results found for: HGBA1C    Assessment & Plan:   Problem List Items Addressed This Visit      Other   Mild cognitive impairment Patient continues to demonstrate change in memory and reasoning ability. Continue to follow up with neurology.    Other Visit Diagnoses    Anxiety    -  Primary   Relevant Orders   Ambulatory referral to Psychiatry Patient will have to get established with psychiatry  for continued Alprazolam due to not wanting to follow up regularly and not wanting to have lab work (UDS) completed. Also for desire to take additional sedative despite extensive discussion to why this is not the best course of treatment.    Nightmares     Patient refuses to go establish care with sleep disorder clinic for evaluation of nightmares and insomnia.    Patient was given Dr Doreene Burke name and number so that she can speak with him.   No orders of the defined types were placed in this encounter.   Follow-up: Return in about 6 months (around  08/25/2020).    Vevelyn Francois, NP

## 2020-02-23 NOTE — Telephone Encounter (Signed)
Carly Rhodes [phone] called to speak with the office supervisor about something that is not true in her chart that Dr. Jerilee Hoh documented and she wants it taken off. She went to the heart doctor and she was told that she needs to have it removed. She said that if the office manager can't get it removed she will have to get a lawyer.  The patient did not discuss with me what she was needing removed from her record.  Please advise

## 2020-02-23 NOTE — Telephone Encounter (Signed)
Pt is requesting a TOC to Dr. Sharlet Salina, her current provider will not prescribe what she needs. She takes 20mg  of Baclofen and Xanax 0.5mg .Dr. Berkley Harvey was her doctor until he retired two years ago. Please advise.

## 2020-02-23 NOTE — Patient Instructions (Signed)

## 2020-02-28 ENCOUNTER — Other Ambulatory Visit: Payer: Self-pay

## 2020-02-28 ENCOUNTER — Encounter: Payer: Self-pay | Admitting: Podiatry

## 2020-02-28 ENCOUNTER — Ambulatory Visit: Payer: PPO | Admitting: Podiatry

## 2020-02-28 DIAGNOSIS — M79675 Pain in left toe(s): Secondary | ICD-10-CM | POA: Diagnosis not present

## 2020-02-28 DIAGNOSIS — M79674 Pain in right toe(s): Secondary | ICD-10-CM

## 2020-02-28 DIAGNOSIS — B351 Tinea unguium: Secondary | ICD-10-CM

## 2020-02-28 DIAGNOSIS — D689 Coagulation defect, unspecified: Secondary | ICD-10-CM | POA: Insufficient documentation

## 2020-02-28 NOTE — Progress Notes (Signed)
This patient returns to my office for at risk foot care.  This patient requires this care by a professional since this patient will be at risk due to having thrombocytopenia and coagulation defect.  Patient is taking eliquis.  This patient is unable to cut nails herself since the patient cannot reach her nails.These nails are painful walking and wearing shoes.  Patient has history of previous foot surgery.  This patient presents for at risk foot care today.  General Appearance  Alert, conversant and in no acute stress.  Vascular  Dorsalis pedis and posterior tibial  pulses are palpable  bilaterally.  Capillary return is within normal limits  bilaterally. Temperature is within normal limits  bilaterally.  Neurologic  Senn-Weinstein monofilament wire test within normal limits  bilaterally. Muscle power within normal limits bilaterally.  Nails Thick disfigured discolored nails with subungual debris  from hallux to fifth toes bilaterally. No evidence of bacterial infection or drainage bilaterally.  Orthopedic  No limitations of motion  feet .  No crepitus or effusions noted.  No bony pathology or digital deformities noted.  HAV  B/L.  Skin  normotropic skin with no porokeratosis noted bilaterally.  No signs of infections or ulcers noted.   Pinch callus right hallux.  Onychomycosis  Pain in right toes  Pain in left toes  Pinch callus  Consent was obtained for treatment procedures.   Mechanical debridement of nails 1-5  bilaterally performed with a nail nipper.  Filed with dremel without incident.  Debridement of pinch callus with # 15 blade.   Return office visit     3 months                 Told patient to return for periodic foot care and evaluation due to potential at risk complications.   Gardiner Barefoot DPM

## 2020-02-29 ENCOUNTER — Other Ambulatory Visit: Payer: Self-pay | Admitting: Nurse Practitioner

## 2020-02-29 DIAGNOSIS — M545 Low back pain, unspecified: Secondary | ICD-10-CM

## 2020-02-29 DIAGNOSIS — F341 Dysthymic disorder: Secondary | ICD-10-CM

## 2020-03-04 ENCOUNTER — Other Ambulatory Visit: Payer: Self-pay | Admitting: Nurse Practitioner

## 2020-03-04 ENCOUNTER — Telehealth: Payer: Self-pay | Admitting: Nurse Practitioner

## 2020-03-04 NOTE — Telephone Encounter (Signed)
Please make patient aware that a referral has been placed for her to get established with psychiatry for continued use of xanax. She will have to make this apt and keep it or I will be weaning this dose down starting in August. Then will be a weaning dose until she if off.  Thanks Refill to be sent

## 2020-03-04 NOTE — Telephone Encounter (Signed)
Refill request for xanax. Please advise.

## 2020-03-05 NOTE — Telephone Encounter (Signed)
I called and talked to patient. Advised that she would need to get established with psych for continue use or that we would start weaning her down in August. Patient is upset and doesn't want to go to psych. She requested to come in to discuss this. She is scheduled for 03/06/20 @ 10:40am.

## 2020-03-06 ENCOUNTER — Ambulatory Visit: Payer: PPO | Admitting: Nurse Practitioner

## 2020-03-06 NOTE — Telephone Encounter (Signed)
Noted  

## 2020-03-08 ENCOUNTER — Telehealth: Payer: Self-pay | Admitting: General Practice

## 2020-03-08 NOTE — Telephone Encounter (Signed)
Copied from [place] [id number]. Topic: General - Other >> Mar 05, 2020 12:00 PM Wynetta Emery, [place] C wrote: Reason for CRM: pt called in to for assistance. Pt says that she takes medication for anxiety. Pt would like to know if accepting providers prescribe anxiety meds before scheduing an apt?   Pt would like further assistance,

## 2020-03-12 NOTE — Telephone Encounter (Signed)
Patient was called and informed that MD does not prescribe requested medication.

## 2020-04-11 ENCOUNTER — Ambulatory Visit: Payer: PPO | Admitting: Sports Medicine

## 2020-04-11 ENCOUNTER — Other Ambulatory Visit: Payer: Self-pay

## 2020-04-11 DIAGNOSIS — M545 Low back pain, unspecified: Secondary | ICD-10-CM

## 2020-04-11 DIAGNOSIS — M1612 Unilateral primary osteoarthritis, left hip: Secondary | ICD-10-CM | POA: Diagnosis not present

## 2020-04-11 MED ORDER — BACLOFEN 10 MG PO TABS: ORAL_TABLET | ORAL | 0 refills | Status: DC

## 2020-04-11 NOTE — Progress Notes (Signed)
Left Hip OA and bursitis  Basically doing better Biking has really helped Using baclofen at hs Not using pain meds now  Has an old Tyl 3 RX but rarely uses and then uses 1/2 tab 3 regular tylenol help but rarely wants to take this much  Takes xanax and trazodone or does not sleep  ROS No sciatica Occasionally low back pain  PE Generally in NAD BP 130/82    Ht 5' 5.5" (1.664 m)    BMI 23.76 kg/m   Hip ROM on left side is 10 deg less  Corky Sox is limited on left only  Good strenth  Neg SLR  No limp

## 2020-04-11 NOTE — Patient Instructions (Addendum)
Mild Hip arthritis  This is doing better When your back is painful do some shorter walks and that will help  Biking is really helping the tendinitis and bursitis  We renewed your baclofen Ok TO TRY 2 AT NIGHT  Let's not change other medications

## 2020-04-11 NOTE — Assessment & Plan Note (Signed)
°  With exercises she has done well Rarely takes any pain meds Still using the orthotics Reck in 3 months

## 2020-04-15 DIAGNOSIS — G4700 Insomnia, unspecified: Secondary | ICD-10-CM | POA: Diagnosis not present

## 2020-04-24 ENCOUNTER — Ambulatory Visit: Payer: PPO | Admitting: Family Medicine

## 2020-04-25 ENCOUNTER — Encounter: Payer: Self-pay | Admitting: Family Medicine

## 2020-04-26 ENCOUNTER — Telehealth: Payer: Self-pay | Admitting: Registered Nurse

## 2020-04-26 NOTE — Telephone Encounter (Signed)
Pt called to see Kathrin Ruddy earlier than 06/14/2020/ thought I could get her on 06/05/2020 but found that he would be overbooked Called patient.back LVM . Put her back on schedule 1st avail for 06/14/2020 so that she would bot lose that spot /

## 2020-04-26 NOTE — Telephone Encounter (Signed)
Tried calling pt several times / rapid busy / cannot leave detailed message per most recent DPR

## 2020-04-30 ENCOUNTER — Ambulatory Visit (INDEPENDENT_AMBULATORY_CARE_PROVIDER_SITE_OTHER): Payer: PPO | Admitting: Registered Nurse

## 2020-04-30 ENCOUNTER — Encounter: Payer: Self-pay | Admitting: Registered Nurse

## 2020-04-30 ENCOUNTER — Other Ambulatory Visit: Payer: Self-pay

## 2020-04-30 VITALS — BP 118/66 | HR 80 | Temp 97.6°F | Resp 18 | Ht 65.5 in | Wt 142.2 lb

## 2020-04-30 DIAGNOSIS — F411 Generalized anxiety disorder: Secondary | ICD-10-CM | POA: Diagnosis not present

## 2020-04-30 DIAGNOSIS — R5383 Other fatigue: Secondary | ICD-10-CM | POA: Diagnosis not present

## 2020-04-30 DIAGNOSIS — Z7689 Persons encountering health services in other specified circumstances: Secondary | ICD-10-CM

## 2020-04-30 NOTE — Patient Instructions (Signed)
° ° ° °  If you have lab work done today you will be contacted with your lab results within the next 2 weeks.  If you have not heard from us then please contact us. The fastest way to get your results is to register for My Chart. ° ° °IF you received an x-ray today, you will receive an invoice from [facility]. Please contact [facility] at [phone] with questions or concerns regarding your invoice.  ° °IF you received labwork today, you will receive an invoice from [facility]. Please contact [facility] at [phone] with questions or concerns regarding your invoice.  ° °Our billing staff will not be able to assist you with questions regarding bills from these companies. ° °You will be contacted with the lab results as soon as they are available. The fastest way to get your results is to activate your My Chart account. Instructions are located on the last page of this paperwork. If you have not heard from us regarding the results in 2 weeks, please contact this office. °  ° ° ° °

## 2020-05-02 ENCOUNTER — Other Ambulatory Visit: Payer: Self-pay

## 2020-05-02 ENCOUNTER — Ambulatory Visit (INDEPENDENT_AMBULATORY_CARE_PROVIDER_SITE_OTHER): Payer: PPO | Admitting: Sports Medicine

## 2020-05-02 DIAGNOSIS — M2012 Hallux valgus (acquired), left foot: Secondary | ICD-10-CM | POA: Insufficient documentation

## 2020-05-02 NOTE — Progress Notes (Signed)
CC;  Wants another pair of Orthotics  Orthotics originally made for hallux valgus and pronated gait. However since using these she has less back, hip and lower extremity pain. She wants to use these in all her shoes and comes for another pair.

## 2020-05-02 NOTE — Assessment & Plan Note (Signed)
Patient was fitted for a : standard, cushioned, semi-rigid orthotic. The orthotic was heated and afterward the patient stood on the orthotic blank positioned on the orthotic stand. The patient was positioned in subtalar neutral position and 10 degrees of ankle dorsiflexion in a weight bearing stance. After completion of molding, a stable base was applied to the orthotic blank. The blank was ground to a stable position for weight bearing. Size: 10 medium EVA and super cell forefoot cushion Base: medial to lateral taper felt heel lift Posting: none  Gait appeared neutral and she had good comfort after completion. Additional orthotic padding:

## 2020-05-08 ENCOUNTER — Ambulatory Visit: Payer: PPO | Admitting: Family Medicine

## 2020-05-13 ENCOUNTER — Telehealth: Payer: Self-pay | Admitting: Registered Nurse

## 2020-05-13 NOTE — Telephone Encounter (Signed)
pt called and stated she went to the pharmacy to get a refill on   ALPRAZolam Duanne Moron) 0.5 MG tablet [[id number]]   Pt stated she was under the impression the provider had already sent in this medication when she saw him last. Pt has a NP apt with Dr. Carlota Raspberry on 06/19/20 Please advise.

## 2020-05-13 NOTE — Telephone Encounter (Signed)
Pt was under impression we were prescribing her Xanax now at her last OV with you. Is this okay or should she get refill from previous prescriber

## 2020-05-14 NOTE — Telephone Encounter (Signed)
Request has been submitted to most recent prescirber and awaiting instruction will inform patient once informed.

## 2020-05-14 NOTE — Telephone Encounter (Signed)
Patient is calling back regarding  this request   Please advise

## 2020-05-15 NOTE — Telephone Encounter (Signed)
Pt is calling about this request / pt has been taking for 35 years . Can we refill this for her

## 2020-05-16 ENCOUNTER — Telehealth: Payer: Self-pay | Admitting: Neurology

## 2020-05-16 NOTE — Telephone Encounter (Signed)
Patient called with concerns she is having trouble getting her sleeping pills, Xanax .5 MG. Or, she'd like a referral to a sleep specialist.   She said she normally does not get this medication from Dr. [name] but doesn't want to be without it since she's been taking it for 35 years.  [facility] [address]

## 2020-05-16 NOTE — Telephone Encounter (Signed)
Pt advised to pt to call her provider who prescribes her medication for sleep. Pt states she do not like that provider he will not give her something new.   Advised to ask to be referred out. Per last referral provider not taking new patients. Pt verbalized understanding.

## 2020-05-27 ENCOUNTER — Ambulatory Visit: Payer: PPO | Admitting: Nurse Practitioner

## 2020-05-30 NOTE — Telephone Encounter (Signed)
Per other providers who have discussed her overuse of alprazolam, I had discussed with Ms. Morine that I could not prescribe this for her. I would be happy to refer her to psychiatry who would be better able to manage this for her.  Thank you  Kathrin Ruddy, NP

## 2020-06-02 ENCOUNTER — Other Ambulatory Visit: Payer: Self-pay | Admitting: Neurology

## 2020-06-04 ENCOUNTER — Other Ambulatory Visit: Payer: Self-pay | Admitting: *Deleted

## 2020-06-04 DIAGNOSIS — M545 Low back pain, unspecified: Secondary | ICD-10-CM

## 2020-06-04 MED ORDER — BACLOFEN 10 MG PO TABS: ORAL_TABLET | ORAL | 0 refills | Status: DC

## 2020-06-05 ENCOUNTER — Ambulatory Visit: Payer: PPO | Admitting: Registered Nurse

## 2020-06-06 ENCOUNTER — Other Ambulatory Visit: Payer: Self-pay

## 2020-06-06 ENCOUNTER — Ambulatory Visit (INDEPENDENT_AMBULATORY_CARE_PROVIDER_SITE_OTHER): Payer: PPO | Admitting: Neurology

## 2020-06-06 ENCOUNTER — Encounter: Payer: Self-pay | Admitting: Neurology

## 2020-06-06 VITALS — BP 145/72 | HR 65 | Ht 65.5 in | Wt 144.2 lb

## 2020-06-06 DIAGNOSIS — G3184 Mild cognitive impairment, so stated: Secondary | ICD-10-CM | POA: Diagnosis not present

## 2020-06-06 DIAGNOSIS — G4700 Insomnia, unspecified: Secondary | ICD-10-CM

## 2020-06-06 MED ORDER — TRAZODONE HCL 50 MG PO TABS: ORAL_TABLET | ORAL | 11 refills | Status: DC

## 2020-06-06 NOTE — Patient Instructions (Signed)
1. Refills sent for Trazodone 50mg  every night  2. Let us know if you would like to proceed with the memory evaluation  3. Follow-up in 6-8 months, call for any changes   RECOMMENDATIONS FOR ALL PATIENTS WITH MEMORY PROBLEMS: 1. Continue to exercise (Recommend 30 minutes of walking everyday, or 3 hours every week) 2. Increase social interactions - continue going to [place] and enjoy social gatherings with friends and family 3. Eat healthy, avoid fried foods and eat more fruits and vegetables 4. Maintain adequate blood pressure, blood sugar, and blood cholesterol level. Reducing the risk of stroke and cardiovascular disease also helps promoting better memory. 5. Avoid stressful situations. Live a simple life and avoid aggravations. Organize your time and prepare for the next day in anticipation. 6. Sleep well, avoid any interruptions of sleep and avoid any distractions in the bedroom that may interfere with adequate sleep quality 7. Avoid sugar, avoid sweets as there is a strong link between excessive sugar intake, diabetes, and cognitive impairment The Mediterranean diet has been shown to help patients reduce the risk of progressive memory disorders and reduces cardiovascular risk. This includes eating fish, eat fruits and green leafy vegetables, nuts like almonds and hazelnuts, walnuts, and also use olive oil. Avoid fast foods and fried foods as much as possible. Avoid sweets and sugar as sugar use has been linked to worsening of memory function.

## 2020-06-06 NOTE — Progress Notes (Signed)
NEUROLOGY FOLLOW UP OFFICE NOTE  Carly Rhodes [id number] 12/07/1945  HISTORY OF PRESENT ILLNESS: I had the pleasure of seeing Carly Rhodes in follow-up in the neurology clinic on 06/06/2020.  The patient was last seen evaluated by telephone 9 months ago. She was initially seen after hospitalization for encephalopathy in May 2019. MRI brain, EEG, lumbar puncture were normal. Diagnosis was altered mental status in the setting of high fever, most likely explanation is viral syndrome with delirium and post-viral labyrinthitis. She thinks that she is doing great physically, but her memory is not as great. She rides her bike 10 miles day, which helps 90% with hip bursitis. She cannot lay on her left side. She continues to feel her mind has changed since her hospitalization in 2019. She lives alone. She denies getting lost driving, but sometimes gets a little mixed up, having to think harder how to get to her destination. She denies missing bill payments, she pays as soon as they come. She denies leaving the stove on. She denies missing medication. She has 2 friends she sees regularly, her daughter lives in [place]. They have not mentioned any memory concerns. She has seen a sleep specialist and states she has been told to "stay on everything I am taking to help her sleep and not change it." When she had seen the sleep specialist, she was taking Trazodone 50mg  qhs, Baclofen, and Xanax, and reported she was sleeping well on this regimen. She states she has been on Xanax for 35 years. She has been unable to get refills for Xanax and expressed frustration with her prior PCP, she will be seeing a different PCP next month. She has been taking 1/2 Xanax to spread it out, now needing to add an over the counter sleep aide to her regiment. She alternates 3 different OTC sleep aides.     History on Initial Assessment 08/02/2018: This is a 74 year old right-handed woman with a history of atrial fibrillation on  Eliquis, hypothyroidism, anxiety, depression, presenting for evaluation after hospitalization for encephalopathy. She was under the impression that she had a brain infection at that time and wonders is the brain infection is causing her fatigue. Records from her hospitalization in May 2019 were reviewed and it was clarified with the patient that she did not have a brain infection during her hospital admission. She was evaluated by Neurology and had an unremarkable MRI brain, EEG, and lumbar puncture, diagnosis of altered mental status in the setting of high fever, most likely explanation is viral syndrome with delirium with post-viral labyrinthitis. She presented to the hospital for headaches and diffuse weakness leading to a fall. She had not been eating for a few days. Her husband was unable to assist her off the floor. She woke up the next morning with speech difficulty, she could understand people but could not speak. She was febrile on arrival to the ED and was noted to be able to give history but show signs of altered mental status. She was found to have atrial fibrillation with RVR and started on Eliquis. I personally reviewed MRI brain with and without contrast done 01/13/18 which did not show any acute changes, there was mild diffuse atrophy, mild chronic microvascular disease. Cuts through the IACs showed normal 7th and 8th cranial nerves, no enhancing mass in temporal region. Her EEG was normal. She had a lumbar puncture with CSF showing WBC of 2, RBC 4400 in tub 1, 515 in tube 4, normal protein, glucose, negative  HSV 1 and 2, negative CSF culture. She was treated empirically with acyclovir, ceftriaxone, and ampicillin, which were discontinued when cultures came back negative. During her stay, she developed vertigo and persistent right-beating nystagmus with rotary component in all directions, consistent with labyrinthitis. She described illusions/delusions during her stay.   She denies any further  headaches, vertigo, or hallucinations since her hospitalization in May 2019. Her main concern is her balance. She states most of the time it is good, but other times it is so bad, especially in an enclosed area. She has not fallen but has had to hold on to objects. She states most of the time she can correct herself. She denies any focal numbness/tingling/weakness, neck/back pain, bowel/bladder dysfunction. She states she does not have headaches, but a place in the back of her head feels funny at times then hurts, she takes a Tylenol with good effect. She denies any vertigo, diplopia, dysarthria/dysphagia. She has noticed cognitive difficulties since her hospitalization in May. She is the primary caregiver of her 17 year old husband. They have been married for 3 years. She states she looks after him and does everything for him. He has a scooter for transfers, she does not need to help him to get around, but she bathes him once a week. She knows she needs help but does not know how to do it since "everything is so personal." She manages finances and has had more trouble since coming out of the hospital. She manages her husband's medications and states she does not forget them, but sometimes forgets her own medication. If she is unsure if she took the Eliquis, she would not take it that day.    PAST MEDICAL HISTORY: Past Medical History:  Diagnosis Date  . Anxiety   . Atrial fibrillation ([place])   . Choledocholithiasis   . Depression   . Dysrhythmia   . Family history of anesthesia complication    sister extreme nausea & vomiting  . 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 Osteoporosis   . Seasonal allergies     MEDICATIONS: Current Outpatient Medications on File Prior to Visit  Medication Sig Dispense Refill  . traZODone (DESYREL) 50 MG tablet TAKE 1/2 TABLET BY MOUTH EVERY NIGHT FOR 1 WEEK, THEN INCREASE TO 1 TABLET EVERY NIGHT 30 tablet 0  . acetaminophen  (TYLENOL) 500 MG tablet Take 2 tablets (1,000 mg total) by mouth every 6 (six) hours as needed for moderate pain.    Marland Kitchen acyclovir (ZOVIRAX) 400 MG tablet Take 1 tablet (400 mg total) by mouth every 6 (six) hours as needed. 30 tablet 2  . ALPRAZolam (XANAX) 0.5 MG tablet TAKE 1/2 (ONE-HALF) TO 1 TABLET BY MOUTH THREE TIMES DAILY AS NEEDED 60 tablet 0  . Ascorbic Acid (VITAMIN C PO) Take 1 tablet by mouth daily.    . baclofen (LIORESAL) 10 MG tablet Take 2 tabs at bedtime 60 tablet 0  . cyanocobalamin (,VITAMIN B-12,) 1000 MCG/ML injection Inject 1 mL (1,000 mcg total) into the muscle every 30 (thirty) days. 1 mL 11  . ELIQUIS 5 MG TABS tablet Take 1 tablet by mouth twice daily 60 tablet 5  . ezetimibe (ZETIA) 10 MG tablet Take 1 tablet (10 mg total) by mouth daily. 90 tablet 1  . Multiple Vitamin (MULTIVITAMIN WITH MINERALS) TABS tablet Take 1 tablet by mouth daily. Centrum    . NEEDLE, DISP, 25 G (BD ECLIPSE NEEDLE) 25G X 5/8" MISC Use for  B12 injections 100 each 3   No current facility-administered medications on file prior to visit.    ALLERGIES: Allergies  Allergen Reactions  . Amiodarone Other (See Comments)    Hair loss  . Procaine Other (See Comments)    REACTION:  Lowers blood pressure, "makes me loopy, can't move or talk"    FAMILY HISTORY: Family History  Problem Relation Age of Onset  . Stomach cancer Brother 80  . Colon cancer Paternal Grandmother 32  . Diabetes Father   . Hypertension Sister   . Heart attack Neg Hx     SOCIAL HISTORY: Social History   Socioeconomic History  . Marital status: Widowed    Spouse name: Not on file  . Number of children: Not on file  . Years of education: Not on file  . Highest education level: Not on file  Occupational History  . Not on file  Tobacco Use  . Smoking status: Never Smoker  . Smokeless tobacco: Never Used  Vaping Use  . Vaping Use: Never used  Substance and Sexual Activity  . Alcohol use: Never  . Drug use: No    . Sexual activity: Not Currently    Birth control/protection: Post-menopausal, Surgical  Other Topics Concern  . Not on file  Social History Narrative      Pt lives in 2 story home with her husband   Has 2 adult children   High school graduate   Retired Emergency planning/management officer   Current care giver to husband      Right handed   Social Determinants of Health   Financial Resource Strain:   . Difficulty of Paying Living Expenses: Not on file  Food Insecurity:   . Worried About Charity fundraiser in the Last Year: Not on file  . Ran Out of Food in the Last Year: Not on file  Transportation Needs:   . Lack of Transportation (Medical): Not on file  . Lack of Transportation (Non-Medical): Not on file  Physical Activity:   . Days of Exercise per Week: Not on file  . Minutes of Exercise per Session: Not on file  Stress:   . Feeling of Stress : Not on file  Social Connections:   . Frequency of Communication with Friends and Family: Not on file  . Frequency of Social Gatherings with Friends and Family: Not on file  . Attends Religious Services: Not on file  . Active Member of Clubs or Organizations: Not on file  . Attends Archivist Meetings: Not on file  . Marital Status: Not on file  Intimate Partner Violence:   . Fear of Current or Ex-Partner: Not on file  . Emotionally Abused: Not on file  . Physically Abused: Not on file  . Sexually Abused: Not on file     PHYSICAL EXAM: Vitals:   06/06/20 0844  BP: (!) 145/72  Pulse: 65  SpO2: 98%   General: No acute distress Head:  Normocephalic/atraumatic Skin/Extremities: No rash, no edema Neurological Exam: alert and awake.  No aphasia or dysarthria. Fund of knowledge is appropriate. Attention and concentration are normal.   Cranial nerves: Pupils equal, round. Extraocular movements intact.  No facial asymmetry.  Motor: moves all extremities symmetrically. Gait narrow-based and steady, no ataxia  IMPRESSION: This is a 74 yo  RH woman with a history of atrial fibrillation on Eliquis, hypothyroidism, anxiety, depression, who had encephalopathy in May 2019 diagnosed as altered mental status in the seeing of high fever, most likely  viral syndrome with delirium. MRI brain, EEG, and lumbar puncture were unremarkable. She again questions this diagnosis, stating she was told she had encephalitis in the hospital, I discussed with her the normal lumbar puncture and MRI brain restuls, and that the diagnosis on discharge was encephalopathy. I discussed the difference between encephalopathy and encephalitis. She continues to feel that cognition has changed since then, we discussed doing Neurocognitive testing to further evaluate cognitive concerns. She is hesitant to do testing due to concerns about what is on her medical records, I discussed with her that all patient encounters need to be documented. She will continue Trazodone 50mg  qhs to help with sleep. Follow-up in 6-8 months. She will contact our office if she would like to proceed with Neurocognitive testing.    Thank you for allowing me to participate in her care.  Please do not hesitate to call for any questions or concerns.   Ellouise Newer, M.D.   CC: Dionisio David, NP

## 2020-06-12 ENCOUNTER — Other Ambulatory Visit: Payer: Self-pay

## 2020-06-12 ENCOUNTER — Ambulatory Visit: Payer: PPO | Admitting: Podiatry

## 2020-06-12 ENCOUNTER — Encounter: Payer: Self-pay | Admitting: Podiatry

## 2020-06-12 DIAGNOSIS — B351 Tinea unguium: Secondary | ICD-10-CM

## 2020-06-12 DIAGNOSIS — M79675 Pain in left toe(s): Secondary | ICD-10-CM | POA: Diagnosis not present

## 2020-06-12 DIAGNOSIS — D689 Coagulation defect, unspecified: Secondary | ICD-10-CM

## 2020-06-12 DIAGNOSIS — M79674 Pain in right toe(s): Secondary | ICD-10-CM | POA: Diagnosis not present

## 2020-06-12 NOTE — Progress Notes (Signed)
This patient returns to my office for at risk foot care.  This patient requires this care by a professional since this patient will be at risk due to having thrombocytopenia and coagulation defect.  Patient is taking eliquis.  This patient is unable to cut nails herself since the patient cannot reach her nails.These nails are painful walking and wearing shoes.  Patient has history of previous foot surgery.  This patient presents for at risk foot care today.  General Appearance  Alert, conversant and in no acute stress.  Vascular  Dorsalis pedis and posterior tibial  pulses are palpable  bilaterally.  Capillary return is within normal limits  bilaterally. Temperature is within normal limits  bilaterally.  Neurologic  Senn-Weinstein monofilament wire test within normal limits  bilaterally. Muscle power within normal limits bilaterally.  Nails Thick disfigured discolored nails with subungual debris  from hallux to fifth toes bilaterally except left hallux nail.. No evidence of bacterial infection or drainage bilaterally.  Orthopedic  No limitations of motion  feet .  No crepitus or effusions noted.  No bony pathology or digital deformities noted.  HAV  Right foot.  Skin  normotropic skin with no porokeratosis noted bilaterally.  No signs of infections or ulcers noted.   Pinch callus right hallux.  Onychomycosis  Pain in right toes  Pain in left toes  Pinch callus  Consent was obtained for treatment procedures.   Mechanical debridement of nails 1-5  bilaterally performed with a nail nipper.  Filed with dremel without incident.     Return office visit     3 months                 Told patient to return for periodic foot care and evaluation due to potential at risk complications.   Gardiner Barefoot DPM

## 2020-06-14 ENCOUNTER — Ambulatory Visit: Payer: PPO | Admitting: Registered Nurse

## 2020-06-19 ENCOUNTER — Ambulatory Visit (INDEPENDENT_AMBULATORY_CARE_PROVIDER_SITE_OTHER): Payer: PPO | Admitting: Family Medicine

## 2020-06-19 ENCOUNTER — Encounter: Payer: Self-pay | Admitting: Family Medicine

## 2020-06-19 ENCOUNTER — Other Ambulatory Visit: Payer: Self-pay

## 2020-06-19 VITALS — BP 130/82 | HR 70 | Temp 97.3°F | Ht 65.5 in | Wt 148.0 lb

## 2020-06-19 DIAGNOSIS — G4700 Insomnia, unspecified: Secondary | ICD-10-CM

## 2020-06-19 DIAGNOSIS — I480 Paroxysmal atrial fibrillation: Secondary | ICD-10-CM

## 2020-06-19 DIAGNOSIS — E538 Deficiency of other specified B group vitamins: Secondary | ICD-10-CM | POA: Diagnosis not present

## 2020-06-19 DIAGNOSIS — Z23 Encounter for immunization: Secondary | ICD-10-CM | POA: Diagnosis not present

## 2020-06-19 MED ORDER — TRAZODONE HCL 100 MG PO TABS: 100.0000 mg | ORAL_TABLET | Freq: Every day | ORAL | 1 refills | Status: DC

## 2020-06-19 NOTE — Patient Instructions (Addendum)
  Try higher dose of trazodone at 100mg  per night to see if that is more effective.  Bring the over the  counter medicine with you to your next visit.   Follow up in next few weeks to discuss sleep further.   Thank you for coming in today.   Return to the clinic or go to the nearest emergency room if any of your symptoms worsen or new symptoms occur.   If you have lab work done today you will be contacted with your lab results within the next 2 weeks.  If you have not heard from [place] then please contact us. The fastest way to get your results is to register for My Chart.   IF you received an x-ray today, you will receive an invoice from [facility]. Please contact [facility] at [phone] with questions or concerns regarding your invoice.   IF you received labwork today, you will receive an invoice from [place]. Please contact [facility] at [phone] with questions or concerns regarding your invoice.   Our billing staff will not be able to assist you with questions regarding bills from these companies.  You will be contacted with the lab results as soon as they are available. The fastest way to get your results is to activate your My Chart account. Instructions are located on the last page of this paperwork. If you have not heard from [place] regarding the results in 2 weeks, please contact this office.

## 2020-06-19 NOTE — Progress Notes (Signed)
Subjective:  Patient ID: Carly Rhodes, female    DOB: 03/20/1946  Age: 74 y.o. MRN: [id number]  CC:  Chief Complaint  Patient presents with  . Establish Care    Pt reports she is feeling great today. with no complaints. PT would like her B-12 checked    HPI Carly Rhodes presents for  New patient to establish care, prior PCP Carly David, NP.   History of atrial fibrillation, hypothyroidism, anxiety/depression, encephalopathy in May 2019 with altered mental status, delirium in setting of infection.   Mild cognitive impairment: Followed by neurology, Dr. Delice Rhodes with appointment October 21.  Note reviewed, previous encephalopathy in May 2019.  Reportedly had some persistent cognition changes.  Neurocognitive testing has been discussed but deferred.  Was continued on trazodone 50 mg to help with sleep and 6 to 1-month follow-up unless she wanted to proceed with neurocognitive testing. Still not wanting to do that testing.  Paroxysmal atrial fibrillation: Followed by [facility] heart care, Carly Rhodes, appointment June 9.  Electrophysiologist, Dr. Lovena Rhodes. Anticoagulation with Eliquis 5 mg twice daily.  She is not on rate control medication. CHA2DS2-VASc score of 3.  Continued on anticoagulation.   Insomnia, dysthymia.  Discussed with previous primary provider [employer].  Has been benzodiazepine previously.  Alprazolam 0.5 mg one half 3 times daily as needed, #60 last prescribed July 19.  Controlled substance database reviewed, alprazolam prescriptions July 19, June 18, May 14, April 14, March 17, September 26, 2022/01/23. Husband passed away in 2019-06-26. Grief counseling through hospice. Feels like she is doing great. Still feels down without husband. Lives be herself. dtr and grandchildren in [place]. Has been on xanax for 35 years. Only sleeps 2-3 hours without xanax. Took 1-1.5 pills per night total prior. Did not take 2.   Off xanax past month or so, Trazodone helping for  sleep, but not as well as xanax. . Sleeping fine now with otc meds - unable to tell me name - melatonin every other night. Later noted not sleeping as wel without xanax.  Also taking baclofen - 10mg  - 2 at bedtime for past few years. Recently refilled by sports medicine (#60 on 06/06/20) - tendinitis/bursitis in hip. Riding bicycle 10 miles per day for hip issue. Has taken trazodone 100mg   - no difference in sleep, only tried twice.  Some nights still trouble sleeping - but unable to specify how many nights per week this has been an issue. Sometimes waking up, other times getting to sleep.  No afternoon caffeine.  Depression screen [facility] 2/9 06/19/2020 04/30/2020 02/23/2020 01/17/2020 11/24/2019  Decreased Interest 0 1 0 0 0  Down, Depressed, Hopeless 0 2 0 0 0  PHQ - 2 Score 0 3 0 0 0  Altered sleeping - - 1 - -  Tired, decreased energy - - 0 - -  Change in appetite - - 0 - -  Feeling bad or failure about yourself  - - 0 - -  Trouble concentrating - - 0 - -  Moving slowly or fidgety/restless - - 0 - -  Suicidal thoughts - - 0 - -  PHQ-9 Score - - 1 - -  Difficult doing work/chores - - - - -  Some recent data might be hidden    B12 deficiency: Receives B12 injection from her friend every 2 weeks.  Lab Results  Component Value Date   VITAMINB12 705 10/23/2019     History Patient Active Problem List   Diagnosis Date  Noted  . Hallux valgus (acquired), left foot 05/02/2020  . Pain due to onychomycosis of toenails of both feet 02/28/2020  . Coagulation disorder ([place]) 02/28/2020  . Greater trochanteric bursitis of left hip 10/24/2019  . Thrombocytopenia, unspecified ([place]) 10/13/2019  . Choledocholithiasis   . Atrial fibrillation, chronic ([place]) 07/19/2019  . LFTs abnormal 07/19/2019  . Abdominal pain 07/19/2019  . Calculus of gallbladder with biliary obstruction but without cholecystitis 07/19/2019  . Gallstones 07/19/2019  . Osteoarthritis of left hip 03/30/2019  . Abnormality of gait  02/07/2019  . Hallux valgus with bunions 02/07/2019  . 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 Subclinical hyperthyroidism 06/15/2018  . History of encephalopathy 02/28/2018  . Mild cognitive impairment 01/10/2018  . Headache 01/10/2018  . UTI (urinary tract infection) 12/23/2017  . Right knee pain 10/19/2017  . Posterior tibial tendinitis of right leg 09/28/2017  . Insomnia 05/20/2017  . Incontinence of feces 05/11/2017  . Caregiver stress 05/11/2017  . Vaginal vault prolapse after hysterectomy 05/11/2017  . Vitamin B 12 deficiency 04/09/2017  . Fatigue 04/08/2017  . Pain of finger of right hand 10/08/2016  . Dyslipidemia 12/25/2015  . Osteoarthritis 12/25/2015  . Osteopenia 12/25/2015  . Cystocele with uterine prolapse 01/24/2015  . Effusion of left knee 05/10/2014  . Knee pain, left 11/21/2013  . Gallstones without obstruction of gallbladder 05/31/2013  . Tendinitis of right rotator cuff 02/16/2013  . Pain in joint, shoulder region 12/06/2012  . Wrist pain 04/26/2012  . Thumb pain 04/26/2012  . Herpes simplex virus (HSV) infection 07/05/2009  . NONTOXIC MULTINODULAR GOITER 07/05/2009  . LIPOMA OF OTHER SKIN AND SUBCUTANEOUS TISSUE 08/16/2007  . DYSTHYMIA 08/16/2007  . LOW BACK PAIN SYNDROME 08/16/2007   Past Medical History:  Diagnosis Date  . Anxiety   . Atrial fibrillation ([place])   . Choledocholithiasis   . Depression   . Dysrhythmia   . Family history of anesthesia complication    sister extreme nausea & vomiting  . 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 Osteoporosis   . Seasonal allergies    Past Surgical History:  Procedure Laterality Date  . ABDOMINAL HYSTERECTOMY  1984  . APPENDECTOMY  1984   at time of Hysterectomy  . BILIARY DILATION  07/21/2019   Procedure: BILIARY DILATION;  Surgeon: Rush Landmark Telford Nab., MD;  Location: [place];  Service: Gastroenterology;;  . CHOLECYSTECTOMY N/A 07/22/2019   Procedure:  LAPAROSCOPIC CHOLECYSTECTOMY;  Surgeon: Clovis Riley, MD;  Location: [place];  Service: General;  Laterality: N/A;  . ERCP N/A 07/21/2019   Procedure: ENDOSCOPIC RETROGRADE CHOLANGIOPANCREATOGRAPHY (ERCP);  Surgeon: Irving Copas., MD;  Location: [place];  Service: Gastroenterology;  Laterality: N/A;  . ERCP    . HAMMER TOE SURGERY  1993   2nd toe left foot  . PANCREATIC STENT PLACEMENT  07/21/2019   Procedure: PANCREATIC STENT PLACEMENT;  Surgeon: Rush Landmark Telford Nab., MD;  Location: [place];  Service: Gastroenterology;;  . REMOVAL OF STONES  07/21/2019   Procedure: REMOVAL OF STONES;  Surgeon: Irving Copas., MD;  Location: [place];  Service: Gastroenterology;;  . [name]  07/21/2019   Procedure: [name];  Surgeon: Irving Copas., MD;  Location: [place];  Service: Gastroenterology;;  . THYROID LOBECTOMY  09/15/2012   Procedure: THYROID LOBECTOMY;  Surgeon: Melissa Montane, MD;  Location: [place];  Service: ENT;  Laterality: Left;  . THYROIDECTOMY, PARTIAL  09/15/2012   Dr Janace Hoard  . TONSILLECTOMY  1957  Allergies  Allergen Reactions  . Amiodarone Other (See Comments)    Hair loss  . Procaine Other (See Comments)    REACTION:  Lowers blood pressure, "makes me loopy, can't move or talk"   Prior to Admission medications   Medication Sig Start Date End Date Taking? Authorizing Provider  acetaminophen (TYLENOL) 500 MG tablet Take 2 tablets (1,000 mg total) by mouth every 6 (six) hours as needed for moderate pain. 07/23/19  Yes Norm Parcel, PA-C  acyclovir (ZOVIRAX) 400 MG tablet Take 1 tablet (400 mg total) by mouth every 6 (six) hours as needed. 04/12/18  Yes Noralee Space, MD  ALPRAZolam Duanne Moron) 0.5 MG tablet TAKE 1/2 (ONE-HALF) TO 1 TABLET BY MOUTH THREE TIMES DAILY AS NEEDED 03/04/20  Yes Vevelyn Francois, NP  Ascorbic Acid (VITAMIN C PO) Take 1 tablet by mouth daily.   Yes [provider]  baclofen (LIORESAL) 10 MG  tablet Take 2 tabs at bedtime 06/04/20  Yes Stefanie Libel, MD  calcium carbonate (OS-CAL - DOSED IN MG OF ELEMENTAL CALCIUM) 1250 (500 Ca) MG tablet Take 1 tablet by mouth.   Yes [provider]  cyanocobalamin (,VITAMIN B-12,) 1000 MCG/ML injection Inject 1 mL (1,000 mcg total) into the muscle every 30 (thirty) days. Patient taking differently: Inject 1,000 mcg into the muscle every 14 (fourteen) days.  10/31/19  Yes Vevelyn Francois, NP  ELIQUIS 5 MG TABS tablet Take 1 tablet by mouth twice daily 12/18/19  Yes Evans Lance, MD  ezetimibe (ZETIA) 10 MG tablet Take 1 tablet (10 mg total) by mouth daily. 12/27/19  Yes Vevelyn Francois, NP  Multiple Vitamin (MULTIVITAMIN WITH MINERALS) TABS tablet Take 1 tablet by mouth daily. Centrum   Yes [provider]  NEEDLE, DISP, 25 G (BD ECLIPSE NEEDLE) 25G X 5/8" MISC Use for B12 injections 10/31/19  Yes Vevelyn Francois, NP  traZODone (DESYREL) 50 MG tablet Take 1 tablet every night 06/06/20  Yes Cameron Sprang, MD   Social History   Socioeconomic History  . Marital status: Widowed    Spouse name: Not on file  . Number of children: Not on file  . Years of education: Not on file  . Highest education level: Not on file  Occupational History  . Not on file  Tobacco Use  . Smoking status: Never Smoker  . Smokeless tobacco: Never Used  Vaping Use  . Vaping Use: Never used  Substance and Sexual Activity  . Alcohol use: Never  . Drug use: No  . Sexual activity: Not Currently    Birth control/protection: Post-menopausal, Surgical  Other Topics Concern  . Not on file  Social History Narrative      Pt lives in 2 story home with her husband   Has 2 adult children   High school graduate   Retired Emergency planning/management officer   Current care giver to husband      Right handed   Social Determinants of Health   Financial Resource Strain:   . Difficulty of Paying Living Expenses: Not on file  Food Insecurity:   . Worried About Charity fundraiser  in the Last Year: Not on file  . Ran Out of Food in the Last Year: Not on file  Transportation Needs:   . Lack of Transportation (Medical): Not on file  . Lack of Transportation (Non-Medical): Not on file  Physical Activity:   . Days of Exercise per Week: Not on file  . Minutes  of Exercise per Session: Not on file  Stress:   . Feeling of Stress : Not on file  Social Connections:   . Frequency of Communication with Friends and Family: Not on file  . Frequency of Social Gatherings with Friends and Family: Not on file  . Attends Religious Services: Not on file  . Active Member of Clubs or Organizations: Not on file  . Attends Archivist Meetings: Not on file  . Marital Status: Not on file  Intimate Partner Violence:   . Fear of Current or Ex-Partner: Not on file  . Emotionally Abused: Not on file  . Physically Abused: Not on file  . Sexually Abused: Not on file    Review of Systems Per HPI   Objective:   Vitals:   06/19/20 1047 06/19/20 1105  BP: (!) 147/83 130/82  Pulse: 70   Temp: (!) 97.3 F (36.3 C)   TempSrc: Temporal   SpO2: 100%   Weight: 148 lb (67.1 kg)   Height: 5' 5.5" (1.664 m)      Physical Exam Vitals reviewed.  Constitutional:      Appearance: She is well-developed.  HENT:     Head: Normocephalic and atraumatic.  Eyes:     Conjunctiva/sclera: Conjunctivae normal.     Pupils: Pupils are equal, round, and reactive to light.  Neck:     Vascular: No carotid bruit.  Cardiovascular:     Rate and Rhythm: Normal rate and regular rhythm.     Heart sounds: Normal heart sounds.  Pulmonary:     Effort: Pulmonary effort is normal.     Breath sounds: Normal breath sounds.  Abdominal:     Palpations: Abdomen is soft. There is no pulsatile mass.     Tenderness: There is no abdominal tenderness.  Skin:    General: Skin is warm and dry.  Neurological:     Mental Status: She is alert and oriented to person, place, and time.  Psychiatric:         Attention and Perception: Attention and perception normal.        Mood and Affect: Mood normal.        Speech: Speech normal.        Behavior: Behavior normal.        Thought Content: Thought content normal.    39 minutes spent during visit, greater than 50% counseling and assimilation of information, chart review, and discussion of plan.    Assessment & Plan:  Kansas Burtt is a 74 y.o. female . Insomnia, unspecified type - Plan: traZODone (DESYREL) 100 MG tablet  -Longstanding issue.  We reviewed risks of sedating medications including muscle relaxant plus alprazolam, and use of those medications with age.  Understanding expressed.  She did feel like she received better sleep with alprazolam, but I would like to try higher dose of trazodone for more than a few days to see if that may be more effective.  Additionally would like to review what medicine she is using over-the-counter and will bring that to next visit.  Could consider restart of low-dose alprazolam if needed.   Need for vaccination - Plan: Flu Vaccine QUAD High Dose(Fluad)   B12 deficiency  - plan on future labs - can review further next visit   Paroxysmal atrial fibrillation ([place])  - stable tolerating anticoagulation   Meds ordered this encounter  Medications  . traZODone (DESYREL) 100 MG tablet    Sig: Take 1 tablet (100 mg total) by mouth  at bedtime.    Dispense:  30 tablet    Refill:  1   Patient Instructions    Try higher dose of trazodone at 100mg  per night to see if that is more effective.  Bring the over the  counter medicine with you to your next visit.   Follow up in next few weeks to discuss sleep further.   Thank you for coming in today.   Return to the clinic or go to the nearest emergency room if any of your symptoms worsen or new symptoms occur.   If you have lab work done today you will be contacted with your lab results within the next 2 weeks.  If you have not heard from [place] then please  contact us. The fastest way to get your results is to register for My Chart.   IF you received an x-ray today, you will receive an invoice from [facility]. Please contact [facility] at [phone] with questions or concerns regarding your invoice.   IF you received labwork today, you will receive an invoice from [facility]. Please contact [facility] at [phone] with questions or concerns regarding your invoice.   Our billing staff will not be able to assist you with questions regarding bills from these companies.  You will be contacted with the lab results as soon as they are available. The fastest way to get your results is to activate your My Chart account. Instructions are located on the last page of this paperwork. If you have not heard from [place] regarding the results in 2 weeks, please contact this office.         Signed, Merri Ray, MD [facility]

## 2020-06-25 ENCOUNTER — Encounter: Payer: Self-pay | Admitting: Family Medicine

## 2020-06-25 DIAGNOSIS — D6869 Other thrombophilia: Secondary | ICD-10-CM | POA: Diagnosis not present

## 2020-06-25 DIAGNOSIS — I480 Paroxysmal atrial fibrillation: Secondary | ICD-10-CM | POA: Diagnosis not present

## 2020-06-25 DIAGNOSIS — M62838 Other muscle spasm: Secondary | ICD-10-CM | POA: Diagnosis not present

## 2020-06-25 DIAGNOSIS — M1611 Unilateral primary osteoarthritis, right hip: Secondary | ICD-10-CM | POA: Diagnosis not present

## 2020-06-25 DIAGNOSIS — G4700 Insomnia, unspecified: Secondary | ICD-10-CM | POA: Diagnosis not present

## 2020-06-25 DIAGNOSIS — E538 Deficiency of other specified B group vitamins: Secondary | ICD-10-CM | POA: Diagnosis not present

## 2020-06-25 DIAGNOSIS — E782 Mixed hyperlipidemia: Secondary | ICD-10-CM | POA: Diagnosis not present

## 2020-06-25 DIAGNOSIS — F419 Anxiety disorder, unspecified: Secondary | ICD-10-CM | POA: Diagnosis not present

## 2020-07-13 ENCOUNTER — Encounter: Payer: Self-pay | Admitting: Registered Nurse

## 2020-07-13 NOTE — Progress Notes (Signed)
Established Patient Office Visit  Subjective:  Patient ID: Carly Rhodes, female    DOB: 10/23/1945  Age: 74 y.o. MRN: [id number]  CC:  Chief Complaint  Patient presents with   New Patient (Initial Visit)    Patient is here to establish care. And patient states she needs a medication refill    HPI [place] presents for visit to est care  Histories reviewed with patient, updated as warranted.  Needs refill of medications - unfortunately has had a lot going on in her personal life and is having a lot of anxiety. She is looking for benzodiazepine rx to help her through this  Consulting with chart, it looks like she has been est with another [facility] NP who had suggested that Ms. Walby establish with psych for further alprazolam rx. In addition, est with Dr. [name] for cognitive impairment and insomnia.   Past Medical History:  Diagnosis Date   Anxiety    Atrial fibrillation ([place])    Choledocholithiasis    Depression    Dysrhythmia    Family history of anesthesia complication    sister extreme nausea & vomiting   Hepatitis 1953   "contagious type"   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   Osteoporosis    Seasonal allergies     Past Surgical History:  Procedure Laterality Date   ABDOMINAL HYSTERECTOMY  1984   APPENDECTOMY  1984   at time of Hysterectomy   BILIARY DILATION  07/21/2019   Procedure: BILIARY DILATION;  Surgeon: Irving Copas., MD;  Location: [place];  Service: Gastroenterology;;   CHOLECYSTECTOMY N/A 07/22/2019   Procedure: LAPAROSCOPIC CHOLECYSTECTOMY;  Surgeon: Clovis Riley, MD;  Location: [place];  Service: General;  Laterality: N/A;   ERCP N/A 07/21/2019   Procedure: ENDOSCOPIC RETROGRADE CHOLANGIOPANCREATOGRAPHY (ERCP);  Surgeon: Irving Copas., MD;  Location: [place];  Service: Gastroenterology;  Laterality: N/A;   ERCP     HAMMER TOE SURGERY  1993   2nd toe left foot   PANCREATIC STENT PLACEMENT   07/21/2019   Procedure: PANCREATIC STENT PLACEMENT;  Surgeon: Rush Landmark Telford Nab., MD;  Location: [place];  Service: Gastroenterology;;   REMOVAL OF STONES  07/21/2019   Procedure: REMOVAL OF STONES;  Surgeon: Irving Copas., MD;  Location: [place];  Service: Gastroenterology;;   [name]  07/21/2019   Procedure: [name];  Surgeon: Mansouraty, Telford Nab., MD;  Location: [place];  Service: Gastroenterology;;   THYROID LOBECTOMY  09/15/2012   Procedure: THYROID LOBECTOMY;  Surgeon: Melissa Montane, MD;  [facility];  Service: ENT;  Laterality: Left;   THYROIDECTOMY, PARTIAL  09/15/2012   Dr Janace Hoard   TONSILLECTOMY  1957    Family History  Problem Relation Age of Onset   Stomach cancer Brother 53   Colon cancer Paternal Grandmother 61   Diabetes Father    Hypertension Sister    Heart attack Neg Hx     Social History   Socioeconomic History   Marital status: Widowed    Spouse name: Not on file   Number of children: Not on file   Years of education: Not on file   Highest education level: Not on file  Occupational History   Not on file  Tobacco Use   Smoking status: Never Smoker   Smokeless tobacco: Never Used  Vaping Use   Vaping Use: Never used  Substance and Sexual Activity   Alcohol use: Never   Drug use: No   Sexual activity: Not Currently  Birth control/protection: Post-menopausal, Surgical  Other Topics Concern   Not on file  Social History Narrative      Pt lives in 2 story home with her husband   Has 2 adult children   High school graduate   Retired Emergency planning/management officer   Current care giver to husband      Right handed   Social Determinants of Health   Financial Resource Strain:    Difficulty of Paying Living Expenses: Not on file  Food Insecurity:    Worried About Charity fundraiser in the Last Year: Not on file   [employer] of Food in the Last Year: Not on file  Transportation Needs:    Lack of  Transportation (Medical): Not on file   Lack of Transportation (Non-Medical): Not on file  Physical Activity:    Days of Exercise per Week: Not on file   Minutes of Exercise per Session: Not on file  Stress:    Feeling of Stress : Not on file  Social Connections:    Frequency of Communication with Friends and Family: Not on file   Frequency of Social Gatherings with Friends and Family: Not on file   Attends Religious Services: Not on file   Active Member of Clubs or Organizations: Not on file   Attends Archivist Meetings: Not on file   Marital Status: Not on file  Intimate Partner Violence:    Fear of Current or Ex-Partner: Not on file   Emotionally Abused: Not on file   Physically Abused: Not on file   Sexually Abused: Not on file    Outpatient Medications Prior to Visit  Medication Sig Dispense Refill   acetaminophen (TYLENOL) 500 MG tablet Take 2 tablets (1,000 mg total) by mouth every 6 (six) hours as needed for moderate pain.     acyclovir (ZOVIRAX) 400 MG tablet Take 1 tablet (400 mg total) by mouth every 6 (six) hours as needed. 30 tablet 2   ALPRAZolam (XANAX) 0.5 MG tablet TAKE 1/2 (ONE-HALF) TO 1 TABLET BY MOUTH THREE TIMES DAILY AS NEEDED 60 tablet 0   Ascorbic Acid (VITAMIN C PO) Take 1 tablet by mouth daily.     cyanocobalamin (,VITAMIN B-12,) 1000 MCG/ML injection Inject 1 mL (1,000 mcg total) into the muscle every 30 (thirty) days. (Patient taking differently: Inject 1,000 mcg into the muscle every 14 (fourteen) days. ) 1 mL 11   ELIQUIS 5 MG TABS tablet Take 1 tablet by mouth twice daily 60 tablet 5   ezetimibe (ZETIA) 10 MG tablet Take 1 tablet (10 mg total) by mouth daily. 90 tablet 1   Multiple Vitamin (MULTIVITAMIN WITH MINERALS) TABS tablet Take 1 tablet by mouth daily. Centrum     NEEDLE, DISP, 25 G (BD ECLIPSE NEEDLE) 25G X 5/8" MISC Use for B12 injections 100 each 3   baclofen (LIORESAL) 10 MG tablet Take 2 tabs at bedtime 60  tablet 0   traZODone (DESYREL) 50 MG tablet Take 50 mg by mouth at bedtime.     No facility-administered medications prior to visit.    Allergies  Allergen Reactions   Amiodarone Other (See Comments)    Hair loss   Procaine Other (See Comments)    REACTION:  Lowers blood pressure, "makes me loopy, can't move or talk"    ROS Review of Systems  Constitutional: Negative.   HENT: Negative.   Eyes: Negative.   Respiratory: Negative.   Cardiovascular: Negative.   Gastrointestinal: Negative.   Genitourinary:  Negative.   Musculoskeletal: Negative.   Skin: Negative.   Neurological: Negative.   Psychiatric/Behavioral: Negative.       Objective:    Physical Exam Vitals and nursing note reviewed.  Constitutional:      General: She is not in acute distress.    Appearance: Normal appearance. She is normal weight. She is not ill-appearing, toxic-appearing or diaphoretic.  Cardiovascular:     Rate and Rhythm: Normal rate and regular rhythm.     Heart sounds: Normal heart sounds. No murmur heard.  No friction rub. No gallop.   Pulmonary:     Effort: Pulmonary effort is normal. No respiratory distress.     Breath sounds: Normal breath sounds. No stridor. No wheezing, rhonchi or rales.  Chest:     Chest wall: No tenderness.  Skin:    General: Skin is warm and dry.  Neurological:     General: No focal deficit present.     Mental Status: She is alert and oriented to person, place, and time. Mental status is at baseline.  Psychiatric:        Mood and Affect: Mood normal.        Behavior: Behavior normal.        Thought Content: Thought content normal.        Judgment: Judgment normal.     BP 118/66    Pulse 80    Temp 97.6 F (36.4 C) (Temporal)    Resp 18    Ht 5' 5.5" (1.664 m)    Wt 142 lb 3.2 oz (64.5 kg)    SpO2 97%    BMI 23.30 kg/m  Wt Readings from Last 3 Encounters:  06/19/20 148 lb (67.1 kg)  06/06/20 144 lb 3.2 oz (65.4 kg)  05/02/20 142 lb (64.4 kg)      There are no preventive care reminders to display for this patient.  There are no preventive care reminders to display for this patient.  Lab Results  Component Value Date   TSH 1.200 10/23/2019   Lab Results  Component Value Date   WBC 5.5 10/23/2019   HGB 14.1 10/23/2019   HCT 44.5 10/23/2019   MCV 89 10/23/2019   PLT 240 10/23/2019   Lab Results  Component Value Date   NA 143 10/23/2019   K 4.1 10/23/2019   CO2 23 07/22/2019   GLUCOSE 87 10/23/2019   BUN 12 10/23/2019   CREATININE 0.83 10/23/2019   BILITOT 0.8 10/23/2019   ALKPHOS 110 10/23/2019   AST 23 10/23/2019   ALT 17 08/23/2019   PROT 6.4 10/23/2019   ALBUMIN 4.4 10/23/2019   CALCIUM 9.9 10/23/2019   ANIONGAP 10 07/22/2019   GFR 64.48 11/02/2018   Lab Results  Component Value Date   CHOL 173 10/23/2019   Lab Results  Component Value Date   HDL 46 10/23/2019   Lab Results  Component Value Date   LDLCALC 103 (H) 10/23/2019   Lab Results  Component Value Date   TRIG 133 10/23/2019   Lab Results  Component Value Date   CHOLHDL 3.8 10/23/2019   No results found for: HGBA1C    Assessment & Plan:   Problem List Items Addressed This Visit      Other   Fatigue    Other Visit Diagnoses    Encounter to establish care    -  Primary   Anxiety state          No orders of the defined types were placed  in this encounter.   Follow-up: No follow-ups on file.   PLAN  Patient unfortunately upset about denying alprazolam refill - but I am in agreement with past PCP, who had denied this as well with fears for patient safety, which I think are warranted.  Patient encouraged to call clinic with any questions, comments, or concerns.  Maximiano Coss, NP

## 2020-07-17 ENCOUNTER — Other Ambulatory Visit: Payer: Self-pay | Admitting: Family Medicine

## 2020-07-17 ENCOUNTER — Other Ambulatory Visit: Payer: Self-pay

## 2020-07-17 ENCOUNTER — Ambulatory Visit
Admission: RE | Admit: 2020-07-17 | Discharge: 2020-07-17 | Disposition: A | Discharge status: Home / Self Care | Payer: PPO | Source: Ambulatory Visit | Attending: Family Medicine | Admitting: Family Medicine

## 2020-07-17 DIAGNOSIS — Z1231 Encounter for screening mammogram for malignant neoplasm of breast: Secondary | ICD-10-CM

## 2020-07-19 ENCOUNTER — Other Ambulatory Visit: Payer: Self-pay | Admitting: *Deleted

## 2020-07-19 MED ORDER — APIXABAN 5 MG PO TABS
5.0000 mg | ORAL_TABLET | Freq: Two times a day (BID) | ORAL | 5 refills | Status: DC
Start: 2020-07-19 — End: 2021-01-22

## 2020-07-19 NOTE — Telephone Encounter (Signed)
Eliquis 5mg  paper refill request received. Patient is 74 years old, weight-67.1kg, Crea-0.83 on 10/23/2019, Diagnosis-Afib, and last seen by Tommye Standard on 01/24/2020. Dose is appropriate based on dosing criteria. Will send in refill to requested pharmacy.

## 2020-08-02 ENCOUNTER — Encounter: Payer: PPO | Admitting: Family Medicine

## 2020-08-15 ENCOUNTER — Ambulatory Visit: Payer: PPO | Admitting: Internal Medicine

## 2020-08-15 ENCOUNTER — Other Ambulatory Visit: Payer: Self-pay

## 2020-08-15 DIAGNOSIS — Z20822 Contact with and (suspected) exposure to covid-19: Secondary | ICD-10-CM

## 2020-08-18 LAB — NOVEL CORONAVIRUS, NAA: SARS-CoV-2, NAA: NOT DETECTED

## 2020-08-19 ENCOUNTER — Telehealth: Payer: Self-pay

## 2020-08-19 NOTE — Telephone Encounter (Signed)

## 2020-08-23 ENCOUNTER — Other Ambulatory Visit: Payer: Self-pay | Admitting: Family Medicine

## 2020-08-23 DIAGNOSIS — G4700 Insomnia, unspecified: Secondary | ICD-10-CM

## 2020-09-06 DIAGNOSIS — G4700 Insomnia, unspecified: Secondary | ICD-10-CM | POA: Diagnosis not present

## 2020-09-06 DIAGNOSIS — R4189 Other symptoms and signs involving cognitive functions and awareness: Secondary | ICD-10-CM | POA: Diagnosis not present

## 2020-09-06 DIAGNOSIS — B001 Herpesviral vesicular dermatitis: Secondary | ICD-10-CM | POA: Diagnosis not present

## 2020-09-06 DIAGNOSIS — E782 Mixed hyperlipidemia: Secondary | ICD-10-CM | POA: Diagnosis not present

## 2020-09-06 DIAGNOSIS — F419 Anxiety disorder, unspecified: Secondary | ICD-10-CM | POA: Diagnosis not present

## 2020-09-06 DIAGNOSIS — M62838 Other muscle spasm: Secondary | ICD-10-CM | POA: Diagnosis not present

## 2020-09-06 DIAGNOSIS — F330 Major depressive disorder, recurrent, mild: Secondary | ICD-10-CM | POA: Diagnosis not present

## 2020-09-06 DIAGNOSIS — Z79899 Other long term (current) drug therapy: Secondary | ICD-10-CM | POA: Diagnosis not present

## 2020-09-06 DIAGNOSIS — I480 Paroxysmal atrial fibrillation: Secondary | ICD-10-CM | POA: Diagnosis not present

## 2020-09-06 DIAGNOSIS — Z0000 Encounter for general adult medical examination without abnormal findings: Secondary | ICD-10-CM | POA: Diagnosis not present

## 2020-09-06 DIAGNOSIS — D6869 Other thrombophilia: Secondary | ICD-10-CM | POA: Diagnosis not present

## 2020-09-06 DIAGNOSIS — E538 Deficiency of other specified B group vitamins: Secondary | ICD-10-CM | POA: Diagnosis not present

## 2020-09-18 ENCOUNTER — Ambulatory Visit: Payer: PPO | Admitting: Podiatry

## 2020-09-18 ENCOUNTER — Other Ambulatory Visit: Payer: Self-pay

## 2020-09-18 ENCOUNTER — Encounter: Payer: Self-pay | Admitting: Podiatry

## 2020-09-18 DIAGNOSIS — F330 Major depressive disorder, recurrent, mild: Secondary | ICD-10-CM | POA: Insufficient documentation

## 2020-09-18 DIAGNOSIS — E782 Mixed hyperlipidemia: Secondary | ICD-10-CM | POA: Insufficient documentation

## 2020-09-18 DIAGNOSIS — D689 Coagulation defect, unspecified: Secondary | ICD-10-CM

## 2020-09-18 DIAGNOSIS — B351 Tinea unguium: Secondary | ICD-10-CM

## 2020-09-18 DIAGNOSIS — G479 Sleep disorder, unspecified: Secondary | ICD-10-CM | POA: Insufficient documentation

## 2020-09-18 DIAGNOSIS — D6869 Other thrombophilia: Secondary | ICD-10-CM | POA: Insufficient documentation

## 2020-09-18 DIAGNOSIS — M79674 Pain in right toe(s): Secondary | ICD-10-CM | POA: Diagnosis not present

## 2020-09-18 DIAGNOSIS — M1610 Unilateral primary osteoarthritis, unspecified hip: Secondary | ICD-10-CM | POA: Insufficient documentation

## 2020-09-18 DIAGNOSIS — K219 Gastro-esophageal reflux disease without esophagitis: Secondary | ICD-10-CM | POA: Insufficient documentation

## 2020-09-18 DIAGNOSIS — F419 Anxiety disorder, unspecified: Secondary | ICD-10-CM | POA: Insufficient documentation

## 2020-09-18 DIAGNOSIS — M79675 Pain in left toe(s): Secondary | ICD-10-CM

## 2020-09-18 NOTE — Progress Notes (Signed)
This patient returns to my office for at risk foot care.  This patient requires this care by a professional since this patient will be at risk due to having thrombocytopenia and coagulation defect.  Patient is taking eliquis.  This patient is unable to cut nails herself since the patient cannot reach her nails.These nails are painful walking and wearing shoes.  Patient has history of previous foot surgery.  This patient presents for at risk foot care today.  General Appearance  Alert, conversant and in no acute stress.  Vascular  Dorsalis pedis and posterior tibial  pulses are palpable  bilaterally.  Capillary return is within normal limits  bilaterally. Temperature is within normal limits  bilaterally.  Neurologic  Senn-Weinstein monofilament wire test within normal limits  bilaterally. Muscle power within normal limits bilaterally.  Nails Thick disfigured discolored nails with subungual debris  from hallux to fifth toes bilaterally except left hallux nail.. No evidence of bacterial infection or drainage bilaterally.  Orthopedic  No limitations of motion  feet .  No crepitus or effusions noted.  No bony pathology or digital deformities noted.  HAV  Right foot.  Skin  normotropic skin with no porokeratosis noted bilaterally.  No signs of infections or ulcers noted.   Pinch callus right hallux.  Onychomycosis  Pain in right toes  Pain in left toes  Pinch callus  Consent was obtained for treatment procedures.   Mechanical debridement of nails 1-5  bilaterally performed with a nail nipper.  Filed with dremel without incident.     Return office visit     3 months                 Told patient to return for periodic foot care and evaluation due to potential at risk complications.   Kandis Henry DPM  

## 2020-09-27 ENCOUNTER — Other Ambulatory Visit: Payer: Self-pay | Admitting: Sports Medicine

## 2020-09-27 ENCOUNTER — Other Ambulatory Visit: Payer: Self-pay | Admitting: Nurse Practitioner

## 2020-09-27 DIAGNOSIS — E538 Deficiency of other specified B group vitamins: Secondary | ICD-10-CM

## 2020-09-27 DIAGNOSIS — M545 Low back pain, unspecified: Secondary | ICD-10-CM

## 2020-09-27 NOTE — Telephone Encounter (Signed)
Please see refill request.

## 2020-09-27 NOTE — Telephone Encounter (Signed)
She is no longer a patient.

## 2020-10-07 DIAGNOSIS — F419 Anxiety disorder, unspecified: Secondary | ICD-10-CM | POA: Diagnosis not present

## 2020-10-07 DIAGNOSIS — Z8661 Personal history of infections of the central nervous system: Secondary | ICD-10-CM | POA: Diagnosis not present

## 2020-10-07 DIAGNOSIS — R4189 Other symptoms and signs involving cognitive functions and awareness: Secondary | ICD-10-CM | POA: Diagnosis not present

## 2020-10-07 DIAGNOSIS — F330 Major depressive disorder, recurrent, mild: Secondary | ICD-10-CM | POA: Diagnosis not present

## 2020-10-30 ENCOUNTER — Other Ambulatory Visit: Payer: Self-pay | Admitting: Nurse Practitioner

## 2020-10-30 DIAGNOSIS — E785 Hyperlipidemia, unspecified: Secondary | ICD-10-CM

## 2020-11-08 ENCOUNTER — Other Ambulatory Visit: Payer: Self-pay | Admitting: Nurse Practitioner

## 2020-11-08 DIAGNOSIS — E538 Deficiency of other specified B group vitamins: Secondary | ICD-10-CM

## 2020-11-14 ENCOUNTER — Other Ambulatory Visit: Payer: Self-pay | Admitting: Nurse Practitioner

## 2020-11-14 DIAGNOSIS — E538 Deficiency of other specified B group vitamins: Secondary | ICD-10-CM

## 2020-11-19 DIAGNOSIS — F419 Anxiety disorder, unspecified: Secondary | ICD-10-CM | POA: Diagnosis not present

## 2020-11-19 DIAGNOSIS — E538 Deficiency of other specified B group vitamins: Secondary | ICD-10-CM | POA: Diagnosis not present

## 2020-11-19 DIAGNOSIS — G479 Sleep disorder, unspecified: Secondary | ICD-10-CM | POA: Diagnosis not present

## 2020-11-19 DIAGNOSIS — E782 Mixed hyperlipidemia: Secondary | ICD-10-CM | POA: Diagnosis not present

## 2020-11-19 DIAGNOSIS — I480 Paroxysmal atrial fibrillation: Secondary | ICD-10-CM | POA: Diagnosis not present

## 2020-11-21 ENCOUNTER — Ambulatory Visit: Payer: PPO | Admitting: Sports Medicine

## 2020-11-21 ENCOUNTER — Other Ambulatory Visit: Payer: Self-pay

## 2020-11-21 DIAGNOSIS — M7062 Trochanteric bursitis, left hip: Secondary | ICD-10-CM | POA: Diagnosis not present

## 2020-11-21 MED ORDER — ACETAMINOPHEN-CODEINE #3 300-30 MG PO TABS: ORAL_TABLET | ORAL | 3 refills | Status: DC

## 2020-11-21 NOTE — Progress Notes (Signed)
CC; LBP  Patient had developed some LBP over both SIJ areas She started using a hand held massage unit This gave her a lot of relief  Her hip pain is also improved Rides bike 12 miles per day Still does some hip rotation exercise  Difficulty to sleep on hip When severe uses 1/2 tab of tylenol 3 and that works for sleep  ROS No sciatica No weakness  PE Older F in NAD Ht 5' 5.5" (1.664 m)   Wt 148 lb (67.1 kg)   BMI 24.25 kg/m   LB No TTP Good flexion and extension Rotation and lateral bend both more limited on left Normal strength heel and toe walk  Hip Good ROM Good abduction and flexion strength  Impression: LBP Resolving and suspect this was muscular strain

## 2020-11-21 NOTE — Assessment & Plan Note (Signed)
This seems improved Consistent biking and some HEP  Keep up this plan Use Tyl 3 only if unable to sleep

## 2020-12-06 ENCOUNTER — Ambulatory Visit: Payer: PPO | Admitting: Physician Assistant

## 2020-12-06 ENCOUNTER — Encounter: Payer: Self-pay | Admitting: Physician Assistant

## 2020-12-06 ENCOUNTER — Other Ambulatory Visit: Payer: Self-pay

## 2020-12-06 VITALS — BP 128/78 | HR 60 | Ht 65.5 in | Wt 139.8 lb

## 2020-12-06 DIAGNOSIS — I480 Paroxysmal atrial fibrillation: Secondary | ICD-10-CM

## 2020-12-06 NOTE — Progress Notes (Signed)
Cardiology Office Note Date:  12/06/2020  Patient ID:  Carly, Rhodes 03-25-1946, MRN [id number] PCP:  [name], MD  EP: Dr. Lovena Le    Chief Complaint:  Annual visit  History of Present Illness: Carly Rhodes is a 75 y.o. 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 Lovena Le, last seen by him 01/03/19 via tele health, doing well, no changes were made. Older notes report  " seen in the hospital when she had presented with AMS in the setting of an acute febrile illness, was found to have atrial fib with a RVR, placed initially on beta blockers where she had a long pause and switched to low dose amiodarone"  Dec 2020 hospitalized had lap-chole   I saw her June 2021 Cardiac wise, sounds like she is doing OK.  She is a little hard to keep on topic.  Mentions that since her encephalitis a couple years back she has never felt the same, like she always has a bit of "brain fog". No CP, palpitations or cardiac awareness. Denies SOB, DOE No near syncope or syncope No bleeding or signs of bleeding  Her husband passed away 25-May-2019 and this has been very difficult.  She says he was the love of her life and does not think she will ever be the same. Discussed perhaps grief counseling of she feels like she need.  She did not seem to feel like that was necessary.  No changes were made, planned for 33mo visit.  TODAY She is doing very well, riding her bike regularly on a 12 mile route that she likes to do. No CP, palpitations No SOB No near syncope or syncope  No bleeding or signs of bleeding She sees her PMD regularly avery few months, has labs done there as well.  Past Medical History:  Diagnosis Date  . Anxiety   . Atrial fibrillation ([place])   . Choledocholithiasis   . Depression   . Dysrhythmia   . Family history of anesthesia complication     sister extreme nausea & vomiting  . 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 Osteoporosis   . Seasonal allergies     Past Surgical History:  Procedure Laterality Date  . ABDOMINAL HYSTERECTOMY  1984  . APPENDECTOMY  1984   at time of Hysterectomy  . BILIARY DILATION  07/21/2019   Procedure: BILIARY DILATION;  Surgeon: Rush Landmark Telford Nab., MD;  Location: [facility];  Service: Gastroenterology;;  . CHOLECYSTECTOMY N/A 07/22/2019   Procedure: LAPAROSCOPIC CHOLECYSTECTOMY;  Surgeon: Clovis Riley, MD;  Location: [facility];  Service: General;  Laterality: N/A;  . ERCP N/A 07/21/2019   Procedure: ENDOSCOPIC RETROGRADE CHOLANGIOPANCREATOGRAPHY (ERCP);  Surgeon: Irving Copas., MD;  Location: [facility];  Service: Gastroenterology;  Laterality: N/A;  . ERCP    . HAMMER TOE SURGERY  1993   2nd toe left foot  . PANCREATIC STENT PLACEMENT  07/21/2019   Procedure: PANCREATIC STENT PLACEMENT;  Surgeon: Rush Landmark Telford Nab., MD;  Location: [facility] [place];  Service: Gastroenterology;;  . REMOVAL OF STONES  07/21/2019   Procedure: REMOVAL OF STONES;  Surgeon: Irving Copas., MD;  Location: [place] [facility] [place];  Service: Gastroenterology;;  . [name]  07/21/2019   Procedure: [name];  Surgeon: Irving Copas., MD;  Location: [facility];  Service: Gastroenterology;;  . THYROID LOBECTOMY  09/15/2012   Procedure: THYROID LOBECTOMY;  Surgeon: Melissa Montane, MD;  Location: [place];  Service: ENT;  Laterality: Left;  . THYROIDECTOMY, PARTIAL  09/15/2012   Dr Janace Hoard  . TONSILLECTOMY  1957    Current Outpatient Medications  Medication Sig Dispense Refill  . acetaminophen (TYLENOL) 500 MG tablet Take 2 tablets (1,000 mg total) by mouth every 6 (six) hours as needed for moderate pain.    Marland Kitchen acetaminophen-codeine (TYLENOL #3) 300-30 MG tablet Take one tab at bedtime as needed 30 tablet 3  . acyclovir (ZOVIRAX) 400 MG tablet Take 1 tablet  (400 mg total) by mouth every 6 (six) hours as needed. 30 tablet 2  . ALPRAZolam (XANAX) 0.5 MG tablet TAKE 1/2 (ONE-HALF) TO 1 TABLET BY MOUTH THREE TIMES DAILY AS NEEDED 60 tablet 0  . apixaban (ELIQUIS) 5 MG TABS tablet Take 1 tablet (5 mg total) by mouth 2 (two) times daily. 60 tablet 5  . Ascorbic Acid (VITAMIN C PO) Take 1 tablet by mouth daily.    . B-D TB SYRINGE 1CC/25GX5/8" 25G X 5/8" 1 ML MISC USE FOR B12 INJECTIONS    . baclofen (LIORESAL) 10 MG tablet TAKE 2 TABLETS BY MOUTH AT BEDTIME 60 tablet 0  . calcium carbonate (OS-CAL - DOSED IN MG OF ELEMENTAL CALCIUM) 1250 (500 Ca) MG tablet Take 1 tablet by mouth.    . cyanocobalamin (,VITAMIN B-12,) 1000 MCG/ML injection INJECT 1 ML (CC) INTRAMUSCULARLY ONCE EVERY 30 DAYS 3 mL 0  . escitalopram (LEXAPRO) 5 MG tablet 1 tablet    . ezetimibe (ZETIA) 10 MG tablet Take 1 tablet by mouth once daily 90 tablet 0  . Multiple Vitamin (MULTIVITAMIN WITH MINERALS) TABS tablet Take 1 tablet by mouth daily. Centrum    . NEEDLE, DISP, 25 G (BD ECLIPSE NEEDLE) 25G X 5/8" MISC Use for B12 injections 100 each 3  . nystatin ointment (MYCOSTATIN) SMARTSIG:1 Topical Every Night    . traZODone (DESYREL) 100 MG tablet TAKE 1 TABLET BY MOUTH AT BEDTIME 30 tablet 0   No current facility-administered medications for this visit.    Allergies:   Amiodarone and Procaine   Social History:  The patient  reports that she has never smoked. She has never used smokeless tobacco. She reports that she does not drink alcohol and does not use drugs.   Family History:  The patient's family history includes Colon cancer (age of onset: 38) in her paternal grandmother; Diabetes in her father; Hypertension in her sister; Stomach cancer (age of onset: 51) in her brother.  ROS:  Please see the history of present illness.  All other systems are reviewed and otherwise negative.   PHYSICAL EXAM:  VS:  There were no vitals taken for this visit. BMI: There is no height or weight  on file to calculate BMI. Well nourished, well developed, in no acute distress  HEENT: normocephalic, atraumatic  Neck: no JVD, carotid bruits or masses Cardiac:  RRR; no significant murmurs, no rubs, or gallops Lungs: CTA b/l no wheezing, rhonchi or rales  Abd: soft, nontender MS: no deformity, age appropriate atrophy Ext: no edema  Skin: warm and dry, no rash Neuro:  No gross deficits appreciated Psych: euthymic mood, full affect    EKG:  Done today and reviewed by myself SR 60bpm, no changes   01/11/2018: TTE Study Conclusions  - Left ventricle: The cavity size was normal. Systolic function was  normal. The estimated ejection fraction was in the range of 55%  to 60%. Wall motion was normal; there were no regional wall  motion abnormalities.  - Aortic valve: Transvalvular velocity was within the normal range.  There was no stenosis. There was no regurgitation. Valve area  (VTI): 1.97 cm^2. Valve area (Vmax): 2.23 cm^2. Valve area  (Vmean): 2.19 cm^2.  - Mitral valve: Transvalvular velocity was within the normal range.  There was no evidence for stenosis. There was mild regurgitation.  Valve area by pressure half-time: 1.26 cm^2.  - Left atrium: The atrium was severely dilated.  - Right ventricle: The cavity size was normal. Wall thickness was  normal. Systolic function was normal.  - Tricuspid valve: There was trivial regurgitation.  - Pulmonary arteries: Systolic pressure was within the normal  range. PA peak pressure: 21 mm Hg (S).     Recent Labs: No results found for requested labs within last 8760 hours.  No results found for requested labs within last 8760 hours.   CrCl cannot be calculated (Patient's most recent lab result is older than the maximum 21 days allowed.).   Wt Readings from Last 3 Encounters:  11/21/20 148 lb (67.1 kg)  06/19/20 148 lb (67.1 kg)  06/06/20 144 lb 3.2 oz (65.4 kg)     Other studies reviewed: Additional  studies/records reviewed today include: summarized above  ASSESSMENT AND PLAN:  1. Paroxysmal Afib     CHA2DS2Vasc is 3, on Eliquis, appropriately dosed     Low burden based on lack of symptoms      Disposition: she sees her PMD regularly, we will see her back in a year, sooner if needed.    Current medicines are reviewed at length with the patient today.  The patient did not have any concerns regarding medicines.  Venetia Night, PA-C 12/06/2020 5:20 AM     [facility] [address] [place] [address] [phone] (office)  [phone] (fax)

## 2020-12-06 NOTE — Patient Instructions (Signed)

## 2020-12-10 ENCOUNTER — Encounter: Payer: Self-pay | Admitting: Sports Medicine

## 2020-12-10 ENCOUNTER — Ambulatory Visit: Payer: PPO | Admitting: Sports Medicine

## 2020-12-10 ENCOUNTER — Other Ambulatory Visit: Payer: Self-pay

## 2020-12-10 VITALS — BP 112/68 | Ht 65.5 in | Wt 148.0 lb

## 2020-12-10 DIAGNOSIS — R269 Unspecified abnormalities of gait and mobility: Secondary | ICD-10-CM

## 2020-12-10 NOTE — Progress Notes (Signed)
Patient was fitted for a : standard, cushioned, semi-rigid orthotic. The orthotic was heated and afterward the patient stood on the orthotic blank positioned on the orthotic stand. The patient was positioned in subtalar neutral position and 10 degrees of ankle dorsiflexion in a weight bearing stance. After completion of molding, a stable base was applied to the orthotic blank. The blank was ground to a stable position for weight bearing. Size: 10 Base: blue eva Posting: scaphoid pad added bilaterally Additional orthotic padding: none  After fitting, pt reported comfort with walking. We discussed that she can return anytime for adjustments.  Dagoberto Ligas, MD Sports Medicine Fellow, [place]  I observed and examined the patient with the [facility] resident and agree with assessment and plan.  Note reviewed and modified by me. I supervised and assisted the orthotic preparation. Ila Mcgill, MD

## 2020-12-17 ENCOUNTER — Other Ambulatory Visit: Payer: Self-pay | Admitting: Sports Medicine

## 2020-12-17 MED ORDER — ACETAMINOPHEN-CODEINE #3 300-30 MG PO TABS: ORAL_TABLET | ORAL | 3 refills | Status: DC

## 2020-12-18 ENCOUNTER — Encounter: Payer: Self-pay | Admitting: Podiatry

## 2020-12-18 ENCOUNTER — Ambulatory Visit (INDEPENDENT_AMBULATORY_CARE_PROVIDER_SITE_OTHER): Payer: PPO | Admitting: Podiatry

## 2020-12-18 ENCOUNTER — Other Ambulatory Visit: Payer: Self-pay

## 2020-12-18 DIAGNOSIS — D689 Coagulation defect, unspecified: Secondary | ICD-10-CM | POA: Diagnosis not present

## 2020-12-18 DIAGNOSIS — B351 Tinea unguium: Secondary | ICD-10-CM

## 2020-12-18 DIAGNOSIS — M79674 Pain in right toe(s): Secondary | ICD-10-CM

## 2020-12-18 DIAGNOSIS — M79675 Pain in left toe(s): Secondary | ICD-10-CM

## 2020-12-18 NOTE — Progress Notes (Signed)
This patient returns to my office for at risk foot care.  This patient requires this care by a professional since this patient will be at risk due to having thrombocytopenia and coagulation defect.  Patient is taking eliquis.  This patient is unable to cut nails herself since the patient cannot reach her nails.These nails are painful walking and wearing shoes.  Patient has history of previous foot surgery.  This patient presents for at risk foot care today.  General Appearance  Alert, conversant and in no acute stress.  Vascular  Dorsalis pedis and posterior tibial  pulses are palpable  bilaterally.  Capillary return is within normal limits  bilaterally. Temperature is within normal limits  bilaterally.  Neurologic  Senn-Weinstein monofilament wire test within normal limits  bilaterally. Muscle power within normal limits bilaterally.  Nails Thick disfigured discolored nails with subungual debris  from hallux to fifth toes bilaterally except left hallux nail.. No evidence of bacterial infection or drainage bilaterally.  Orthopedic  No limitations of motion  feet .  No crepitus or effusions noted.  No bony pathology or digital deformities noted.  HAV  Right foot.  Skin  normotropic skin with no porokeratosis noted bilaterally.  No signs of infections or ulcers noted.   Pinch callus right hallux.  Onychomycosis  Pain in right toes  Pain in left toes  Pinch callus right hallux.  Consent was obtained for treatment procedures.   Mechanical debridement of nails 1-5  bilaterally performed with a nail nipper.  Filed with dremel without incident.     Return office visit     4 months                 Told patient to return for periodic foot care and evaluation due to potential at risk complications.   Gardiner Barefoot DPM

## 2020-12-19 ENCOUNTER — Ambulatory Visit: Payer: PPO | Admitting: Sports Medicine

## 2020-12-19 DIAGNOSIS — G8929 Other chronic pain: Secondary | ICD-10-CM | POA: Diagnosis not present

## 2020-12-19 DIAGNOSIS — M545 Low back pain, unspecified: Secondary | ICD-10-CM

## 2020-12-19 DIAGNOSIS — E538 Deficiency of other specified B group vitamins: Secondary | ICD-10-CM | POA: Diagnosis not present

## 2020-12-19 NOTE — Progress Notes (Signed)
CC: Low back Pain  About 1 weeks ago LBP mild started Rides her bike 12 miles a day This got worse over past 5 days Hurts across her low back No radiation into either leg No specific injury She had a remote LBP episode  Past HX; bilat hip pain MRI of left hip in 2020 showed bursitis and some mild DJD MRI of brain showed some microvascular disease 12/2017  ROS No weakness in extremities No bowel or bladder issues  PE Older F in NAD BP 112/60   Ht 5' 5.5" (1.664 m)   Wt 140 lb (63.5 kg)   BMI 22.94 kg/m   Good ROM of back SLR is normal bilaterally Pain localized over low lumbar region and not specific Knee to chest or shoulders feels good Heel and toe walk is normal Good strength in lower extremities

## 2020-12-19 NOTE — Patient Instructions (Signed)
Williams low back stretches and exercises  1. Knee to chest 2. Knee to opposite shoulder 3. Pelvic tilt  Do the 3 above for 5 repeats holding 5 breaths  4. Modified crunch Do 15 repeats gradually build to 3 sets of 15  OK to bike after doing these for a 17f days but try to get this loose before returning to bike  Before getting on the bike also do some easy back motion  You have tylenol with codeine if needed

## 2020-12-19 NOTE — Assessment & Plan Note (Signed)
This seems mechanical Possibly from biking without enough warm up  Suggested easy motion exercises before biking Start Williams flexion exercises Uses Tyl 3 at night time if too much pain  Start some short walks  Reck if not resolving

## 2020-12-25 ENCOUNTER — Encounter: Payer: Self-pay | Admitting: Neurology

## 2020-12-25 ENCOUNTER — Ambulatory Visit: Payer: PPO | Admitting: Neurology

## 2020-12-25 VITALS — BP 127/66 | HR 61 | Ht 65.0 in | Wt 142.6 lb

## 2020-12-25 DIAGNOSIS — G3184 Mild cognitive impairment, so stated: Secondary | ICD-10-CM | POA: Diagnosis not present

## 2020-12-25 NOTE — Progress Notes (Signed)
Reason for visit: Mild cognitive impairment  Referring physician: Dr. Trudi Ida P Carly Rhodes is a 75 y.o. female  History of present illness:  Carly Rhodes is a 75 year old right-handed white female with a history of a hospitalization that occurred in May 2019.  The patient came in with an elevated fever of around 102.4 associated with confusion.  The patient had suspected meningitis, but her work-up never confirmed this.  MRI of the brain showed mild chronic small vessel disease, no acute changes.  EEG study was normal.  Lumbar puncture was done and was normal.  Cultures were negative.  The patient was presumed to have a viral infection, she seemed to recover but the patient claims that since that hospitalization she has had some decline in her cognitive abilities.  The patient has had difficulty with misplacing things about the house, she has difficulty staying on task.  She cannot focus on a single task for long and seems to go from one task to the next.  Otherwise, she is doing fairly well.  She lives alone as her husband has passed away.  She is able to manage completely independently.  She is operating a motor vehicle without difficulty, she denies any issues getting lost.  She manages her finances and keeps up with her medications and appointments.  She denies any significant issues with short-term memory or difficulty remembering names for people.  She denies any family history of memory problems.  She stays quite active, she will ride her bicycle up to 12 miles a day.  She reports no numbness or weakness of the extremities.  She does have some problems with bladder control at times, she claims that she has a "dropped bladder".  She denies any bowel function problems.  She does have a history of chronic insomnia but she takes medications for sleep and does well with this, she has good energy level during the day.  She is sent to this office for further evaluation.  The patient was seen  previously by Dr. Delice Rhodes.  Past Medical History:  Diagnosis Date  . Anxiety   . Atrial fibrillation ([place])   . Choledocholithiasis   . Depression   . Dysrhythmia   . Family history of anesthesia complication    sister extreme nausea & vomiting  . 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 Osteoporosis   . Seasonal allergies     Past Surgical History:  Procedure Laterality Date  . ABDOMINAL HYSTERECTOMY  1984  . APPENDECTOMY  1984   at time of Hysterectomy  . BILIARY DILATION  07/21/2019   Procedure: BILIARY DILATION;  Surgeon: Rush Landmark Telford Nab., MD;  Location: [place];  Service: Gastroenterology;;  . CHOLECYSTECTOMY N/A 07/22/2019   Procedure: LAPAROSCOPIC CHOLECYSTECTOMY;  Surgeon: Clovis Riley, MD;  Location: [facility];  Service: General;  Laterality: N/A;  . ERCP N/A 07/21/2019   Procedure: ENDOSCOPIC RETROGRADE CHOLANGIOPANCREATOGRAPHY (ERCP);  Surgeon: Irving Copas., MD;  Location: [facility];  Service: Gastroenterology;  Laterality: N/A;  . ERCP    . HAMMER TOE SURGERY  1993   2nd toe left foot  . PANCREATIC STENT PLACEMENT  07/21/2019   Procedure: PANCREATIC STENT PLACEMENT;  Surgeon: Rush Landmark Telford Nab., MD;  Location: [place];  Service: Gastroenterology;;  . REMOVAL OF STONES  07/21/2019   Procedure: REMOVAL OF STONES;  Surgeon: Irving Copas., MD;  Location: [place] [facility];  Service: Gastroenterology;;  . Carly Rhodes  07/21/2019  Procedure: SPHINCTEROTOMY;  Surgeon: Rush Landmark Telford Nab., MD;  Location: [facility];  Service: Gastroenterology;;  . THYROID LOBECTOMY  09/15/2012   Procedure: THYROID LOBECTOMY;  Surgeon: Melissa Montane, MD;  Location: [facility];  Service: ENT;  Laterality: Left;  . THYROIDECTOMY, PARTIAL  09/15/2012   Dr Janace Hoard  . TONSILLECTOMY  1957    Family History  Problem Relation Age of Onset  . Stomach cancer Brother 50  . Colon cancer Paternal Grandmother 74  . Diabetes Father    . Hypertension Sister   . Heart attack Neg Hx     Social history:  reports that she has never smoked. She has never used smokeless tobacco. She reports that she does not drink alcohol and does not use drugs.  Medications:  Prior to Admission medications   Medication Sig Start Date End Date Taking? Authorizing Provider  acetaminophen-codeine (TYLENOL #3) 300-30 MG tablet Take one tab at bedtime as needed 12/17/20  Yes Stefanie Libel, MD  acyclovir (ZOVIRAX) 400 MG tablet Take 1 tablet (400 mg total) by mouth every 6 (six) hours as needed. 04/12/18  Yes Noralee Space, MD  ALPRAZolam Duanne Moron) 0.5 MG tablet TAKE 1/2 (ONE-HALF) TO 1 TABLET BY MOUTH THREE TIMES DAILY AS NEEDED 03/04/20  Yes Vevelyn Francois, NP  apixaban (ELIQUIS) 5 MG TABS tablet Take 1 tablet (5 mg total) by mouth 2 (two) times daily. 07/19/20  Yes Evans Lance, MD  Ascorbic Acid (VITAMIN C PO) Take 1 tablet by mouth daily.   Yes [provider]  B-D TB SYRINGE 1CC/25GX5/8" 25G X 5/8" 1 ML MISC USE FOR B12 INJECTIONS 07/16/20  Yes [provider]  baclofen (LIORESAL) 10 MG tablet TAKE 2 TABLETS BY MOUTH AT BEDTIME 09/27/20  Yes Fields, Peterson Ao, MD  calcium carbonate (OS-CAL - DOSED IN MG OF ELEMENTAL CALCIUM) 1250 (500 Ca) MG tablet Take 1 tablet by mouth.   Yes [provider]  cyanocobalamin (,VITAMIN B-12,) 1000 MCG/ML injection INJECT 1 ML (CC) INTRAMUSCULARLY ONCE EVERY 30 DAYS 09/27/20  Yes Vevelyn Francois, NP  escitalopram (LEXAPRO) 5 MG tablet 1 tablet 09/06/20  Yes [provider]  ezetimibe (ZETIA) 10 MG tablet Take 1 tablet by mouth once daily 10/30/20  Yes Vevelyn Francois, NP  Multiple Vitamin (MULTIVITAMIN WITH MINERALS) TABS tablet Take 1 tablet by mouth daily. Centrum   Yes [provider]  NEEDLE, DISP, 25 G (BD ECLIPSE NEEDLE) 25G X 5/8" MISC Use for B12 injections 10/31/19  Yes Vevelyn Francois, NP  traZODone (DESYREL) 100 MG tablet TAKE 1 TABLET BY MOUTH AT BEDTIME 08/23/20  Yes  Wendie Agreste, MD      Allergies  Allergen Reactions  . Amiodarone Other (See Comments)    Hair loss  . Procaine Other (See Comments)    REACTION:  Lowers blood pressure, "makes me loopy, can't move or talk"    ROS:  Out of a complete 14 system review of symptoms, the patient complains only of the following symptoms, and all other reviewed systems are negative.  Memory difficulties Episodic walking problems  Blood pressure 127/66, pulse 61, height 5\' 5"  (1.651 m), weight 142 lb 9.6 oz (64.7 kg).  Physical Exam  General: The patient is alert and cooperative at the time of the examination.  Eyes: Pupils are equal, round, and reactive to light. Discs are flat bilaterally.  Neck: The neck is supple, no carotid bruits are noted.  Respiratory: The respiratory examination is clear.  Cardiovascular: The cardiovascular  examination reveals a regular rate and rhythm, no obvious murmurs or rubs are noted.  Skin: Extremities are without significant edema.  Neurologic Exam  Mental status: The patient is alert and oriented x 3 at the time of the examination. The patient scored 17/30 on the MoCA evaluation.  Cranial nerves: Facial symmetry is present. There is good sensation of the face to pinprick and soft touch bilaterally. The strength of the facial muscles and the muscles to head turning and shoulder shrug are normal bilaterally. Speech is well enunciated, no aphasia or dysarthria is noted. Extraocular movements are full. Visual fields are full. The tongue is midline, and the patient has symmetric elevation of the soft palate. No obvious hearing deficits are noted.  Motor: The motor testing reveals 5 over 5 strength of all 4 extremities. Good symmetric motor tone is noted throughout.  Sensory: Sensory testing is intact to pinprick, soft touch, vibration sensation, and position sense on all 4 extremities. No evidence of extinction is noted.  Coordination: Cerebellar testing  reveals good finger-nose-finger and heel-to-shin bilaterally.  Gait and station: Gait is normal. Tandem gait is normal. Romberg is negative. No drift is seen.  Reflexes: Deep tendon reflexes are symmetric and normal bilaterally. Toes are downgoing bilaterally.   MRI brain 01/11/18:  IMPRESSION: Chronic ischemic microangiopathy without acute intracranial abnormality.  * MRI scan images were reviewed online. I agree with the written report.    Assessment/Plan:  1.  Mild cognitive impairment  The patient reports some decline in cognitive functioning following the May 2019 hospitalization for encephalopathy associated with a febrile illness.  The patient is able to function independently currently.  We will continue to follow her memory issues over time.  We will not initiate any medical therapy currently.  She will follow up here in 6 months.  Jill Alexanders MD 12/25/2020 2:22 PM  [facility] [address] [place], [place] [address]  Phone [phone] Fax [phone]

## 2021-01-17 DIAGNOSIS — E538 Deficiency of other specified B group vitamins: Secondary | ICD-10-CM | POA: Diagnosis not present

## 2021-01-22 ENCOUNTER — Other Ambulatory Visit: Payer: Self-pay | Admitting: Internal Medicine

## 2021-01-22 NOTE — Telephone Encounter (Signed)
Pt last saw Tommye Standard, [place] on 12/06/20, last labs 09/06/20 Creat 0.76, age 75, weight 64.7kg, based on specified criteria pt is on appropriate dosage of Eliquis 5mg  BID for afib.  Will refill rx.

## 2021-01-24 DIAGNOSIS — E782 Mixed hyperlipidemia: Secondary | ICD-10-CM | POA: Diagnosis not present

## 2021-01-24 DIAGNOSIS — G4700 Insomnia, unspecified: Secondary | ICD-10-CM | POA: Diagnosis not present

## 2021-01-24 DIAGNOSIS — E538 Deficiency of other specified B group vitamins: Secondary | ICD-10-CM | POA: Diagnosis not present

## 2021-01-24 DIAGNOSIS — I480 Paroxysmal atrial fibrillation: Secondary | ICD-10-CM | POA: Diagnosis not present

## 2021-01-24 DIAGNOSIS — F419 Anxiety disorder, unspecified: Secondary | ICD-10-CM | POA: Diagnosis not present

## 2021-01-24 DIAGNOSIS — R252 Cramp and spasm: Secondary | ICD-10-CM | POA: Diagnosis not present

## 2021-01-24 DIAGNOSIS — F330 Major depressive disorder, recurrent, mild: Secondary | ICD-10-CM | POA: Diagnosis not present

## 2021-01-27 ENCOUNTER — Telehealth: Payer: Self-pay | Admitting: Neurology

## 2021-01-27 DIAGNOSIS — R413 Other amnesia: Secondary | ICD-10-CM

## 2021-01-27 NOTE — Telephone Encounter (Signed)
Pt called, getting more forgetful, need sooner appt. Would like a call from the nurse.

## 2021-01-27 NOTE — Telephone Encounter (Signed)
I called the patient.  The patient has had ongoing progression of memory issues over the last 3 years, she is concerned about this, MRI done previously did show some degree of small vessel ischemic changes, we will check another study.

## 2021-02-01 ENCOUNTER — Ambulatory Visit
Admission: RE | Admit: 2021-02-01 | Discharge: 2021-02-01 | Disposition: A | Discharge status: Home / Self Care | Payer: PPO | Source: Ambulatory Visit | Attending: Neurology | Admitting: Neurology

## 2021-02-01 ENCOUNTER — Other Ambulatory Visit: Payer: Self-pay

## 2021-02-01 DIAGNOSIS — I6782 Cerebral ischemia: Secondary | ICD-10-CM | POA: Diagnosis not present

## 2021-02-01 DIAGNOSIS — G319 Degenerative disease of nervous system, unspecified: Secondary | ICD-10-CM | POA: Diagnosis not present

## 2021-02-01 DIAGNOSIS — R413 Other amnesia: Secondary | ICD-10-CM

## 2021-02-01 DIAGNOSIS — I6381 Other cerebral infarction due to occlusion or stenosis of small artery: Secondary | ICD-10-CM | POA: Diagnosis not present

## 2021-02-02 ENCOUNTER — Telehealth: Payer: Self-pay | Admitting: Neurology

## 2021-02-02 NOTE — Telephone Encounter (Signed)
I called patient.  MRI of the brain is minimally changed from 2019, there may be one single right lentiform nucleus lacunar infarct that is new, the patient is on anticoagulant therapy, her blood pressure was unremarkable.  The patient is not sure whether or not her memory is gradually worsening over time, we will follow this and if it is getting worse, we will consider a medication such as Aricept for the memory.    MRI brain 02/01/21:  IMPRESSION: Mildly motion degraded examination.   No evidence of acute intracranial abnormality.   Mild chronic small vessel ischemic disease within the cerebral white matter, stable as compared to the brain MRI of 01/11/2018. A small chronic lacunar infarct within the right lentiform nucleus is new from this prior exam.   Mild generalized cerebral cerebellar atrophy, stable.   Mild paranasal sinus disease, as described.

## 2021-02-07 ENCOUNTER — Ambulatory Visit: Payer: PPO | Admitting: Neurology

## 2021-02-14 DIAGNOSIS — E538 Deficiency of other specified B group vitamins: Secondary | ICD-10-CM | POA: Diagnosis not present

## 2021-03-05 DIAGNOSIS — F330 Major depressive disorder, recurrent, mild: Secondary | ICD-10-CM | POA: Diagnosis not present

## 2021-03-05 DIAGNOSIS — G4700 Insomnia, unspecified: Secondary | ICD-10-CM | POA: Diagnosis not present

## 2021-03-05 DIAGNOSIS — Z7189 Other specified counseling: Secondary | ICD-10-CM | POA: Diagnosis not present

## 2021-03-05 DIAGNOSIS — I480 Paroxysmal atrial fibrillation: Secondary | ICD-10-CM | POA: Diagnosis not present

## 2021-03-14 DIAGNOSIS — E538 Deficiency of other specified B group vitamins: Secondary | ICD-10-CM | POA: Diagnosis not present

## 2021-03-18 DIAGNOSIS — R21 Rash and other nonspecific skin eruption: Secondary | ICD-10-CM | POA: Diagnosis not present

## 2021-03-24 ENCOUNTER — Ambulatory Visit: Payer: PPO | Admitting: Podiatry

## 2021-03-25 ENCOUNTER — Ambulatory Visit: Payer: PPO | Admitting: Podiatry

## 2021-03-31 ENCOUNTER — Ambulatory Visit: Payer: PPO | Admitting: Neurology

## 2021-04-07 ENCOUNTER — Ambulatory Visit: Payer: PPO | Admitting: Neurology

## 2021-04-07 ENCOUNTER — Encounter: Payer: Self-pay | Admitting: Neurology

## 2021-04-07 VITALS — BP 116/62 | HR 58 | Ht 65.5 in | Wt 140.8 lb

## 2021-04-07 DIAGNOSIS — G3184 Mild cognitive impairment, so stated: Secondary | ICD-10-CM

## 2021-04-07 MED ORDER — DONEPEZIL HCL 5 MG PO TABS: 5.0000 mg | ORAL_TABLET | Freq: Every day | ORAL | 2 refills | Status: DC

## 2021-04-07 NOTE — Progress Notes (Signed)
Reason for visit: Memory disorder  Carly Rhodes is an 75 y.o. female  History of present illness:  [name] is a 75 year old right-handed white female with a history of mild memory disorder.  The patient lives alone, she functions independently.  She is operating motor vehicle without difficulty, she is able to keep up with her appointments and with her medications but she occasionally will make errors in this regard.  She is able to do her own finances.  The patient will have occasional bad days with her cognitive functioning, but this is less than 1 day a month.  The patient currently is not on any medications for memory.  MRI of the brain done shows a mild level of small vessel ischemic changes that is mainly chronic.  She has no history of hypertension but she does have a history of atrial fibrillation on anticoagulation therapy.  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   Osteoporosis    Seasonal allergies     Past Surgical History:  Procedure Laterality Date   ABDOMINAL HYSTERECTOMY  1984   APPENDECTOMY  1984   at time of Hysterectomy   BILIARY DILATION  07/21/2019   Procedure: BILIARY DILATION;  Surgeon: Carly Rhodes., MD;  Location: [place];  Service: Gastroenterology;;   CHOLECYSTECTOMY N/A 07/22/2019   Procedure: LAPAROSCOPIC CHOLECYSTECTOMY;  Surgeon: Carly Riley, MD;  Location: [place];  Service: General;  Laterality: N/A;   ERCP N/A 07/21/2019   Procedure: ENDOSCOPIC RETROGRADE CHOLANGIOPANCREATOGRAPHY (ERCP);  Surgeon: Carly Rhodes., MD;  Location: [place];  Service: Gastroenterology;  Laterality: N/A;   ERCP     HAMMER TOE SURGERY  1993   2nd toe left foot   PANCREATIC STENT PLACEMENT   07/21/2019   Procedure: PANCREATIC STENT PLACEMENT;  Surgeon: Carly Landmark Telford Nab., MD;  Location: [place];  Service: Gastroenterology;;   REMOVAL OF STONES  07/21/2019   Procedure: REMOVAL OF STONES;  Surgeon: Carly Rhodes., MD;  Location: [place];  Service: Gastroenterology;;   Carly Rhodes  07/21/2019   Procedure: Carly Rhodes;  Surgeon: Carly Rhodes, Telford Nab., MD;  Location: [place];  Service: Gastroenterology;;   THYROID LOBECTOMY  09/15/2012   Procedure: THYROID LOBECTOMY;  Surgeon: Carly Montane, MD;  Location: [facility] OR;  Service: ENT;  Laterality: Left;   THYROIDECTOMY, PARTIAL  09/15/2012   Dr Carly Rhodes   TONSILLECTOMY  1957    Family History  Problem Relation Age of Onset   Stomach cancer Brother 46   Colon cancer Paternal Grandmother 38   Diabetes Father    Hypertension Sister    Heart attack Neg Hx     Social history:  reports that she has never smoked. She has never used smokeless tobacco. She reports that she does not drink alcohol and does not use drugs.    Allergies  Allergen Reactions   Amiodarone Other (See Comments)    Hair loss   Procaine Other (See Comments)    REACTION:  Lowers blood pressure, "makes me loopy, can't move or talk"    Medications:  Prior to Admission medications   Medication Sig Start Date End Date Taking? Authorizing Provider  acetaminophen-codeine (TYLENOL #3) 300-30 MG tablet Take one  tab at bedtime as needed 12/17/20   Stefanie Libel, MD  acyclovir (ZOVIRAX) 400 MG tablet Take 1 tablet (400 mg total) by mouth every 6 (six) hours as needed. 04/12/18   Noralee Space, MD  ALPRAZolam Duanne Moron) 0.5 MG tablet TAKE 1/2 (ONE-HALF) TO 1 TABLET BY MOUTH THREE TIMES DAILY AS NEEDED 03/04/20   Carly Francois, NP  Ascorbic Acid (VITAMIN C PO) Take 1 tablet by mouth daily.    [provider]  B-D TB SYRINGE 1CC/25GX5/8" 25G X 5/8" 1 ML MISC USE FOR B12 INJECTIONS 07/16/20   [provider]  baclofen (LIORESAL) 10 MG  tablet TAKE 2 TABLETS BY MOUTH AT BEDTIME 09/27/20   Stefanie Libel, MD  calcium carbonate (OS-CAL - DOSED IN MG OF ELEMENTAL CALCIUM) 1250 (500 Ca) MG tablet Take 1 tablet by mouth.    [provider]  cyanocobalamin (,VITAMIN B-12,) 1000 MCG/ML injection INJECT 1 ML (CC) INTRAMUSCULARLY ONCE EVERY 30 DAYS 09/27/20   Carly Francois, NP  ELIQUIS 5 MG TABS tablet Take 1 tablet by mouth twice daily 01/22/21   Carly Lance, MD  escitalopram (LEXAPRO) 5 MG tablet 1 tablet 09/06/20   [provider]  ezetimibe (ZETIA) 10 MG tablet Take 1 tablet by mouth once daily 10/30/20   Carly Francois, NP  Multiple Vitamin (MULTIVITAMIN WITH MINERALS) TABS tablet Take 1 tablet by mouth daily. Centrum    [provider]  NEEDLE, DISP, 25 G (BD ECLIPSE NEEDLE) 25G X 5/8" MISC Use for B12 injections 10/31/19   Carly Francois, NP  traZODone (DESYREL) 100 MG tablet TAKE 1 TABLET BY MOUTH AT BEDTIME 08/23/20   Carly Agreste, MD    ROS:  Out of a complete 14 system review of symptoms, the patient complains only of the following symptoms, and all other reviewed systems are negative.  Memory problems  Blood pressure 116/62, pulse (!) 58, height 5' 5.5" (1.664 m), weight 140 lb 12.8 oz (63.9 kg).  Physical Exam  General: The patient is alert and cooperative at the time of the examination.  Skin: No significant peripheral edema is noted.   Neurologic Exam  Mental status: The patient is alert and oriented x 3 at the time of the examination. The Mini-Mental status examination done today shows a total score of 23/30.  The patient is able to name 14 four-legged animals in 60 seconds.   Cranial nerves: Facial symmetry is present. Speech is normal, no aphasia or dysarthria is noted. Extraocular movements are full. Visual fields are full.  Motor: The patient has good strength in all 4 extremities.  Sensory examination: Soft touch sensation is symmetric on the face, arms, and  legs.  Coordination: The patient has good finger-nose-finger and heel-to-shin bilaterally.  Gait and station: The patient has a normal gait. Tandem gait is minimally unsteady.  Romberg is negative. No drift is seen.  Reflexes: Deep tendon reflexes are symmetric.   MRI brain 02/01/21:   IMPRESSION: Mildly motion degraded examination.   No evidence of acute intracranial abnormality.   Mild chronic small vessel ischemic disease within the cerebral white matter, stable as compared to the brain MRI of 01/11/2018. A small chronic lacunar infarct within the right lentiform nucleus is new from this prior exam.   Mild generalized cerebral cerebellar atrophy, stable.   Mild paranasal sinus disease, as described.  * MRI scan images were reviewed online. I agree with the written report.    Assessment/Plan:  1.  Mild  memory disturbance, mild cognitive impairment  The patient is functioning normally at this point, she does live alone.  We discussed potentially starting a low-dose of Aricept, she is amenable to this.  She could potentially benefit from cognitive therapy through speech therapy, if she is amenable to this in the future we can do this.  She will follow-up here in 6 months, in the future she can be followed through Dr. Brett Fairy.  Jill Alexanders MD 04/07/2021 3:02 PM  [facility] [address] [place], [place] [address]  Phone [phone] Fax [phone]

## 2021-04-07 NOTE — Patient Instructions (Signed)
We will start Aricept for memory.  Begin Aricept (donepezil) at 5 mg at night for one month. If this medication is well-tolerated, please call our office and we will call in a prescription for the 10 mg tablets. Look out for side effects that may include nausea, diarrhea, weight loss, or stomach cramps. This medication will also cause a runny nose, therefore there is no need for allergy medications for this purpose.  

## 2021-04-14 DIAGNOSIS — E538 Deficiency of other specified B group vitamins: Secondary | ICD-10-CM | POA: Diagnosis not present

## 2021-05-15 DIAGNOSIS — E538 Deficiency of other specified B group vitamins: Secondary | ICD-10-CM | POA: Diagnosis not present

## 2021-06-03 DIAGNOSIS — E538 Deficiency of other specified B group vitamins: Secondary | ICD-10-CM | POA: Diagnosis not present

## 2021-06-03 DIAGNOSIS — F419 Anxiety disorder, unspecified: Secondary | ICD-10-CM | POA: Diagnosis not present

## 2021-06-03 DIAGNOSIS — G3184 Mild cognitive impairment, so stated: Secondary | ICD-10-CM | POA: Diagnosis not present

## 2021-06-03 DIAGNOSIS — Z23 Encounter for immunization: Secondary | ICD-10-CM | POA: Diagnosis not present

## 2021-06-03 DIAGNOSIS — I480 Paroxysmal atrial fibrillation: Secondary | ICD-10-CM | POA: Diagnosis not present

## 2021-06-03 DIAGNOSIS — F330 Major depressive disorder, recurrent, mild: Secondary | ICD-10-CM | POA: Diagnosis not present

## 2021-07-04 DIAGNOSIS — E538 Deficiency of other specified B group vitamins: Secondary | ICD-10-CM | POA: Diagnosis not present

## 2021-07-16 ENCOUNTER — Ambulatory Visit: Payer: PPO | Admitting: Neurology

## 2021-07-25 DIAGNOSIS — E538 Deficiency of other specified B group vitamins: Secondary | ICD-10-CM | POA: Diagnosis not present

## 2021-07-25 DIAGNOSIS — I480 Paroxysmal atrial fibrillation: Secondary | ICD-10-CM | POA: Diagnosis not present

## 2021-07-25 DIAGNOSIS — F330 Major depressive disorder, recurrent, mild: Secondary | ICD-10-CM | POA: Diagnosis not present

## 2021-07-25 DIAGNOSIS — F419 Anxiety disorder, unspecified: Secondary | ICD-10-CM | POA: Diagnosis not present

## 2021-08-06 ENCOUNTER — Ambulatory Visit: Payer: PPO | Admitting: Sports Medicine

## 2021-08-07 ENCOUNTER — Ambulatory Visit: Payer: PPO | Admitting: Sports Medicine

## 2021-08-07 VITALS — BP 108/79 | Ht 65.5 in | Wt 135.0 lb

## 2021-08-07 DIAGNOSIS — M25521 Pain in right elbow: Secondary | ICD-10-CM

## 2021-08-07 MED ORDER — ACETAMINOPHEN-CODEINE #3 300-30 MG PO TABS: ORAL_TABLET | ORAL | 3 refills | Status: DC

## 2021-08-07 NOTE — Patient Instructions (Signed)
°  You may have a small strain of your rotator cuff but I do not think you have a tear. I am going to give you some exercises to start doing at home. You can use ice or heat for the pain but I would start with ice first. I would also use some Voltaren gel about an hour before bedtime. You may continue with her Tylenol with codeine as needed. Keep your appointment with Dr. Oneida Alar at the end of January but follow-up sooner if your arm does not start to feel better.

## 2021-08-07 NOTE — Progress Notes (Signed)
° °  Subjective:    Patient ID: Carly Rhodes, female    DOB: 06-10-1946, 75 y.o.   MRN: [id number]  HPI chief complaint: Right arm pain  Carly Rhodes comes in today complaining of right arm pain that she localizes to the lateral right shoulder.  Pain is been present for about 2 weeks.  She denies any trauma.  Her pain is present only with sleeping on her right side at night.  She has no pain during the day.  She has to take Tylenol with codeine to sleep.  Her pain will radiate down the lateral aspect of her arm past the elbow into the forearm.  She describes it as a knife type of sensation.  No associated numbness or tingling.  No radiating pain into the hand.  She has a remote history of bursitis in her shoulder but does not think her current pain is similar to what she experienced at that time.  Today she actually feels pretty good.  In fact, last night was the first night she was able to rest without pain.  Past medical history reviewed Medications reviewed Allergies reviewed    Review of Systems As above    Objective:   Physical Exam  Well-developed, well-nourished.  No acute distress  Right shoulder: Full painless range of motion.  There is some tenderness to palpation over the bicipital groove but she has a negative speeds and negative Yergason's.  Rotator cuff strength is 5/5.  She does have pain with Hawkins impingement test.  Negative empty can.  No tenderness over the acromioclavicular joint.  Neurovascularly intact distally.      Assessment & Plan:   Right arm pain likely secondary to rotator cuff strain  Rotator cuff strength is very good so I do not think she has a significant rotator cuff tear.  Although she does have some tenderness at the bicipital groove most of her discomfort appears to be in the lateral shoulder and consistent more with rotator cuff pain then proximal biceps tendon.  We will give her some easy Jobe exercises to do at home she will follow-up with Dr.  Oneida Rhodes as scheduled at the end of January.  I have also recommended that she try ice and topical Voltaren as needed.  I have also refilled her Tylenol with codeine to continue taking as needed at night for more severe pain.  She understands that if her pain persists then she should return to the office sooner than her scheduled follow-up visit with Dr. Oneida Rhodes.  I would consider ultrasound evaluation of the shoulder if that is the case.  This note was dictated using Dragon naturally speaking software and may contain errors in syntax, spelling, or content which have not been identified prior to signing this note.

## 2021-08-13 ENCOUNTER — Telehealth: Payer: Self-pay | Admitting: Internal Medicine

## 2021-08-13 DIAGNOSIS — I482 Chronic atrial fibrillation, unspecified: Secondary | ICD-10-CM

## 2021-08-13 MED ORDER — APIXABAN 5 MG PO TABS: 5.0000 mg | ORAL_TABLET | Freq: Two times a day (BID) | ORAL | 5 refills | Status: DC

## 2021-08-13 NOTE — Telephone Encounter (Signed)
[facility] does not supply samples they are dispensed by Refill Dept.   Eliquis 5mg  BID is the correct dose. Patient is 75 years old, weight-61.2kg, Crea-0.76 on 09/06/2020 via [facility] from [employer], [place], and last seen by Tommye Standard on 12/06/2020. Dose is appropriate based on dosing criteria. Will send in refill to requested pharmacy.

## 2021-08-13 NOTE — Telephone Encounter (Signed)
Called pt to inform her that I would be leaving 1 box of Eliquis 5 mg tablets samples at the front desk at [facility] and if she has any other problems, questions or concerns, to please give our office a call. Pt verbalized understanding.   LOT:   [id number]   EXP: 06/2023

## 2021-08-13 NOTE — Telephone Encounter (Signed)
° ° °  Patient calling the office for samples of medication: ° ° °1.  What medication and dosage are you requesting samples for? °ELIQUIS 5 MG TABS tablet ° °2.  Are you currently out of this medication? yes ° ° °

## 2021-08-15 DIAGNOSIS — E538 Deficiency of other specified B group vitamins: Secondary | ICD-10-CM | POA: Diagnosis not present

## 2021-09-05 DIAGNOSIS — E538 Deficiency of other specified B group vitamins: Secondary | ICD-10-CM | POA: Diagnosis not present

## 2021-09-11 ENCOUNTER — Ambulatory Visit: Payer: PPO | Admitting: Neurology

## 2021-09-16 ENCOUNTER — Ambulatory Visit: Payer: PPO | Admitting: Sports Medicine

## 2021-09-16 DIAGNOSIS — Z1159 Encounter for screening for other viral diseases: Secondary | ICD-10-CM | POA: Diagnosis not present

## 2021-09-16 DIAGNOSIS — Z23 Encounter for immunization: Secondary | ICD-10-CM | POA: Diagnosis not present

## 2021-09-16 DIAGNOSIS — E782 Mixed hyperlipidemia: Secondary | ICD-10-CM | POA: Diagnosis not present

## 2021-09-16 DIAGNOSIS — G479 Sleep disorder, unspecified: Secondary | ICD-10-CM | POA: Diagnosis not present

## 2021-09-16 DIAGNOSIS — I480 Paroxysmal atrial fibrillation: Secondary | ICD-10-CM | POA: Diagnosis not present

## 2021-09-16 DIAGNOSIS — G3184 Mild cognitive impairment, so stated: Secondary | ICD-10-CM | POA: Diagnosis not present

## 2021-09-16 DIAGNOSIS — F419 Anxiety disorder, unspecified: Secondary | ICD-10-CM | POA: Diagnosis not present

## 2021-09-16 DIAGNOSIS — M62838 Other muscle spasm: Secondary | ICD-10-CM | POA: Diagnosis not present

## 2021-09-16 DIAGNOSIS — Z0000 Encounter for general adult medical examination without abnormal findings: Secondary | ICD-10-CM | POA: Diagnosis not present

## 2021-09-16 DIAGNOSIS — Z79899 Other long term (current) drug therapy: Secondary | ICD-10-CM | POA: Diagnosis not present

## 2021-09-16 DIAGNOSIS — E538 Deficiency of other specified B group vitamins: Secondary | ICD-10-CM | POA: Diagnosis not present

## 2021-09-16 DIAGNOSIS — Z780 Asymptomatic menopausal state: Secondary | ICD-10-CM | POA: Diagnosis not present

## 2021-09-18 ENCOUNTER — Encounter: Payer: PPO | Admitting: Sports Medicine

## 2021-09-19 ENCOUNTER — Other Ambulatory Visit: Payer: Self-pay | Admitting: Family Medicine

## 2021-09-19 DIAGNOSIS — E538 Deficiency of other specified B group vitamins: Secondary | ICD-10-CM | POA: Diagnosis not present

## 2021-09-19 DIAGNOSIS — Z780 Asymptomatic menopausal state: Secondary | ICD-10-CM

## 2021-09-23 ENCOUNTER — Other Ambulatory Visit: Payer: Self-pay | Admitting: Family Medicine

## 2021-09-23 DIAGNOSIS — Z1231 Encounter for screening mammogram for malignant neoplasm of breast: Secondary | ICD-10-CM

## 2021-09-25 ENCOUNTER — Ambulatory Visit: Payer: PPO | Admitting: Sports Medicine

## 2021-09-25 VITALS — BP 126/80 | Ht 65.5 in | Wt 135.0 lb

## 2021-09-25 DIAGNOSIS — R269 Unspecified abnormalities of gait and mobility: Secondary | ICD-10-CM | POA: Diagnosis not present

## 2021-09-25 DIAGNOSIS — M2010 Hallux valgus (acquired), unspecified foot: Secondary | ICD-10-CM

## 2021-09-25 DIAGNOSIS — M21619 Bunion of unspecified foot: Secondary | ICD-10-CM

## 2021-09-25 NOTE — Progress Notes (Signed)
° °  Carly Rhodes is a 76 y.o. female who presents to [facility] today for the following:  New Orthotics  Patient was fitted for a : standard, cushioned, semi-rigid orthotic. The orthotic was heated and afterward the patient stood on the orthotic blank positioned on the orthotic stand. The patient was positioned in subtalar neutral position and 10 degrees of ankle dorsiflexion in a weight bearing stance. After completion of molding, a stable base was applied to the orthotic blank. The blank was ground to a stable position for weight bearing. Size: 10 Base: blue EVA Posting: b/l small scaphoid padding Additional orthotic padding: midfoot padding b/l  Total time spent with the patient was 30 minutes with greater than 50% of the time spent in face-to-face consultation discussing orthotic construction, instruction, and sitting. Gait was neutral with orthotics in place. Patient found them to be comfortable. Follow-up as needed.   [place] Constable, D.O.  PGY-4 [facility]  09/25/2021 2:01 PM  I observed and examined the patient with the [facility] resident and agree with assessment and plan.  Note reviewed and modified by me. Ila Mcgill, MD

## 2021-10-02 ENCOUNTER — Ambulatory Visit
Admission: RE | Admit: 2021-10-02 | Discharge: 2021-10-02 | Disposition: A | Discharge status: Home / Self Care | Payer: PPO | Source: Ambulatory Visit | Attending: Family Medicine | Admitting: Family Medicine

## 2021-10-02 DIAGNOSIS — Z1231 Encounter for screening mammogram for malignant neoplasm of breast: Secondary | ICD-10-CM

## 2021-10-03 DIAGNOSIS — E538 Deficiency of other specified B group vitamins: Secondary | ICD-10-CM | POA: Diagnosis not present

## 2021-10-17 DIAGNOSIS — E538 Deficiency of other specified B group vitamins: Secondary | ICD-10-CM | POA: Diagnosis not present

## 2021-10-31 DIAGNOSIS — E538 Deficiency of other specified B group vitamins: Secondary | ICD-10-CM | POA: Diagnosis not present

## 2021-11-05 DIAGNOSIS — Z8639 Personal history of other endocrine, nutritional and metabolic disease: Secondary | ICD-10-CM | POA: Diagnosis not present

## 2021-11-05 DIAGNOSIS — M542 Cervicalgia: Secondary | ICD-10-CM | POA: Diagnosis not present

## 2021-11-07 ENCOUNTER — Other Ambulatory Visit: Payer: Self-pay | Admitting: Physician Assistant

## 2021-11-07 DIAGNOSIS — Z8639 Personal history of other endocrine, nutritional and metabolic disease: Secondary | ICD-10-CM

## 2021-11-10 DIAGNOSIS — E049 Nontoxic goiter, unspecified: Secondary | ICD-10-CM | POA: Diagnosis not present

## 2021-11-10 DIAGNOSIS — Z9089 Acquired absence of other organs: Secondary | ICD-10-CM | POA: Diagnosis not present

## 2021-11-10 DIAGNOSIS — Z9889 Other specified postprocedural states: Secondary | ICD-10-CM | POA: Diagnosis not present

## 2021-11-11 ENCOUNTER — Inpatient Hospital Stay: Admission: RE | Admit: 2021-11-11 | Payer: PPO | Source: Ambulatory Visit

## 2021-11-14 DIAGNOSIS — E538 Deficiency of other specified B group vitamins: Secondary | ICD-10-CM | POA: Diagnosis not present

## 2021-11-28 DIAGNOSIS — E538 Deficiency of other specified B group vitamins: Secondary | ICD-10-CM | POA: Diagnosis not present

## 2021-12-05 ENCOUNTER — Encounter: Payer: Self-pay | Admitting: Internal Medicine

## 2021-12-05 ENCOUNTER — Ambulatory Visit: Payer: PPO | Admitting: Internal Medicine

## 2021-12-05 DIAGNOSIS — I480 Paroxysmal atrial fibrillation: Secondary | ICD-10-CM

## 2021-12-05 NOTE — Progress Notes (Signed)
? ? ? ? ?HPI ?Carly Rhodes returns today for followup. I had seen her in the hospital when she had presented with AMS in the setting of an acute febrile illness, was found to have atrial fib with a RVR, placed initially on beta blockers where she had a long pause and switched to low dose amiodarone and an [place]. She returns today and notes her heart rhythm has been good. She denies palpitations. Despite stopping amio, her hair has not grown back. She has stopped taking her eliquis.  ?Allergies  ?Allergen Reactions  ? Amiodarone Other (See Comments)  ?  Hair loss  ? Procaine Other (See Comments)  ?  REACTION:  Lowers blood pressure, "makes me loopy, can't move or talk"  ? ? ? ?Current Outpatient Medications  ?Medication Sig Dispense Refill  ? acetaminophen-codeine (TYLENOL #3) 300-30 MG tablet Take one tab at bedtime as needed 30 tablet 3  ? acyclovir (ZOVIRAX) 400 MG tablet Take 1 tablet (400 mg total) by mouth every 6 (six) hours as needed. 30 tablet 2  ? ALPRAZolam (XANAX) 0.5 MG tablet TAKE 1/2 (ONE-HALF) TO 1 TABLET BY MOUTH THREE TIMES DAILY AS NEEDED 60 tablet 0  ? apixaban (ELIQUIS) 5 MG TABS tablet Take 1 tablet (5 mg total) by mouth 2 (two) times daily. 60 tablet 5  ? Ascorbic Acid (VITAMIN C PO) Take 1 tablet by mouth daily.    ? B-D TB SYRINGE 1CC/25GX5/8" 25G X 5/8" 1 ML MISC USE FOR B12 INJECTIONS    ? baclofen (LIORESAL) 10 MG tablet TAKE 2 TABLETS BY MOUTH AT BEDTIME 60 tablet 0  ? calcium carbonate (OS-CAL - DOSED IN MG OF ELEMENTAL CALCIUM) 1250 (500 Ca) MG tablet Take 1 tablet by mouth.    ? cyanocobalamin (,VITAMIN B-12,) 1000 MCG/ML injection INJECT 1 ML (CC) INTRAMUSCULARLY ONCE EVERY 30 DAYS 3 mL 0  ? donepezil (ARICEPT) 5 MG tablet Take 1 tablet (5 mg total) by mouth at bedtime. 30 tablet 2  ? escitalopram (LEXAPRO) 5 MG tablet 1 tablet    ? ezetimibe (ZETIA) 10 MG tablet Take 1 tablet by mouth once daily 90 tablet 0  ? Multiple Vitamin (MULTIVITAMIN WITH MINERALS) TABS tablet Take 1 tablet by  mouth daily. Centrum    ? NEEDLE, DISP, 25 G (BD ECLIPSE NEEDLE) 25G X 5/8" MISC Use for B12 injections 100 each 3  ? traZODone (DESYREL) 100 MG tablet TAKE 1 TABLET BY MOUTH AT BEDTIME 30 tablet 0  ? ?No current facility-administered medications for this visit.  ? ? ? ?Past Medical History:  ?Diagnosis Date  ? Anxiety   ? Atrial fibrillation ([place])   ? Choledocholithiasis   ? Depression   ? Dysrhythmia   ? Family history of anesthesia complication   ? sister extreme nausea & vomiting  ? Hepatitis 1953  ? "contagious type"  ? 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 Osteoporosis   ? Seasonal allergies   ? ? ?ROS: ? ? All systems reviewed and negative except as noted in the HPI. ? ? ?Past Surgical History:  ?Procedure Laterality Date  ? ABDOMINAL HYSTERECTOMY  1984  ? APPENDECTOMY  1984  ? at time of Hysterectomy  ? BILIARY DILATION  07/21/2019  ? Procedure: BILIARY DILATION;  Surgeon: Rush Landmark Telford Nab., MD;  Location: [place];  Service: Gastroenterology;;  ? CHOLECYSTECTOMY N/A 07/22/2019  ? Procedure: LAPAROSCOPIC CHOLECYSTECTOMY;  Surgeon: Clovis Riley, MD;  Location: [place];  Service: General;  Laterality: N/A;  ? ERCP  N/A 07/21/2019  ? Procedure: ENDOSCOPIC RETROGRADE CHOLANGIOPANCREATOGRAPHY (ERCP);  Surgeon: Irving Copas., MD;  Location: [place];  Service: Gastroenterology;  Laterality: N/A;  ? ERCP    ? [place]  ? 2nd toe left foot  ? PANCREATIC STENT PLACEMENT  07/21/2019  ? Procedure: PANCREATIC STENT PLACEMENT;  Surgeon: Rush Landmark Telford Nab., MD;  Location: [place];  Service: Gastroenterology;;  ? REMOVAL OF STONES  07/21/2019  ? Procedure: REMOVAL OF STONES;  Surgeon: Rush Landmark Telford Nab., MD;  Location: [place];  Service: Gastroenterology;;  ? SPHINCTEROTOMY  07/21/2019  ? Procedure: SPHINCTEROTOMY;  Surgeon: Mansouraty, Telford Nab., MD;  Location: [place];  Service: Gastroenterology;;  ? THYROID LOBECTOMY  09/15/2012  ? Procedure: THYROID LOBECTOMY;   Surgeon: Melissa Montane, MD;  Location: [place];  Service: ENT;  Laterality: Left;  ? THYROIDECTOMY, PARTIAL  09/15/2012  ? Dr Janace Hoard  ? TONSILLECTOMY  1957  ? ? ? ?Family History  ?Problem Relation Age of Onset  ? Stomach cancer Brother 90  ? Colon cancer Paternal Grandmother 33  ? Diabetes Father   ? Hypertension Sister   ? Heart attack Neg Hx   ? ? ? ?Social History  ? ?Socioeconomic History  ? Marital status: Widowed  ?  Spouse name: Not on file  ? Number of children: Not on file  ? Years of education: 31  ? Highest education level: Not on file  ?Occupational History  ? Not on file  ?Tobacco Use  ? Smoking status: Never  ? Smokeless tobacco: Never  ?Vaping Use  ? Vaping Use: Never used  ?Substance and Sexual Activity  ? Alcohol use: Never  ? Drug use: No  ? Sexual activity: Not Currently  ?  Birth control/protection: Post-menopausal, Surgical  ?Other Topics Concern  ? Not on file  ?Social History Narrative  ? Pt lives in 2 story home with her husband  ? 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 Right handed  ? Drinks 2-3 cups caffeine daily  ? ?Social Determinants of Health  ? ?Financial Resource Strain: Not on file  ?Food Insecurity: Not on file  ?Transportation Needs: Not on file  ?Physical Activity: Not on file  ?Stress: Not on file  ?Social Connections: Not on file  ?Intimate Partner Violence: Not on file  ? ? ? ?BP 112/62   Pulse (!) 59   Ht 5' 5.5" (1.664 m)   Wt 135 lb 12.8 oz (61.6 kg)   SpO2 92%   BMI 22.25 kg/m?  ? ?Physical Exam: ? ?Well appearing NAD ?HEENT: Unremarkable ?Neck:  No JVD, no thyromegally ?Lymphatics:  No adenopathy ?Back:  No CVA tenderness ?Lungs:  Clear ?HEART:  Regular rate rhythm, no murmurs, no rubs, no clicks ?Abd:  soft, positive bowel sounds, no organomegally, no rebound, no guarding ?Ext:  2 plus pulses, no edema, no cyanosis, no clubbing ?Skin:  No rashes no nodules ?Neuro:  CN II through XII intact, motor grossly intact ? ?EKG - nsr ? ?DEVICE  ?Normal device function.  See PaceArt for  details.  ? ?Assess/Plan:  ?1. PAF - she is maintaining NSR. I have recommended she continue eliquis and she is willing. ?2. HTN - her blood pressure is reasonable today.  ?3. Sinus node dysfunction - she is asymptomatic and has no additional bradycardia. No indication for PPM at this time.  ?  ?Carleene Overlie Kearstin Learn,M.D.  ?  ? ? ?

## 2021-12-05 NOTE — Patient Instructions (Signed)
Medication Instructions:  ?Your physician recommends that you continue on your current medications as directed. Please refer to the Current Medication list given to you today. ? ?Labwork: ?None ordered. ? ?Testing/Procedures: ?None ordered. ? ?Follow-Up: ? ?Your physician wants you to follow-up in: one year with Gregg Taylor, MD or one of the following Advanced Practice Providers on your designated Care Team:   ?Renee Ursuy, PA-C ?Michael "Andy" Tillery, PA-C ? ? ?Any Other Special Instructions Will Be Listed Below (If Applicable). ? ?If you need a refill on your cardiac medications before your next appointment, please call your pharmacy.  ? ?Important Information About Sugar ? ? ? ? ? ? ? ?

## 2021-12-08 ENCOUNTER — Ambulatory Visit: Payer: PPO | Admitting: Neurology

## 2021-12-08 ENCOUNTER — Encounter: Payer: Self-pay | Admitting: Neurology

## 2021-12-08 VITALS — BP 138/83 | HR 56 | Ht 65.5 in | Wt 136.5 lb

## 2021-12-08 DIAGNOSIS — Z8619 Personal history of other infectious and parasitic diseases: Secondary | ICD-10-CM

## 2021-12-08 DIAGNOSIS — Z8669 Personal history of other diseases of the nervous system and sense organs: Secondary | ICD-10-CM

## 2021-12-08 DIAGNOSIS — G9340 Encephalopathy, unspecified: Secondary | ICD-10-CM | POA: Insufficient documentation

## 2021-12-08 DIAGNOSIS — R4189 Other symptoms and signs involving cognitive functions and awareness: Secondary | ICD-10-CM | POA: Diagnosis not present

## 2021-12-08 DIAGNOSIS — I480 Paroxysmal atrial fibrillation: Secondary | ICD-10-CM | POA: Diagnosis not present

## 2021-12-08 DIAGNOSIS — A86 Unspecified viral encephalitis: Secondary | ICD-10-CM | POA: Insufficient documentation

## 2021-12-08 NOTE — Progress Notes (Signed)
? ? ? ?Provider:  Larey Rhodes, M D  ?Referring Provider: [name], MD ?Primary Care Physician:  Carly Macadam, MD ? ?Chief Complaint  ?Patient presents with  ? Follow-up  ?  Rm 10, alone. Here for TOC from Carly. Jannifer Rhodes. Pt is unable to remember short term things like were she placed something she recently laid down. Hx of mini strokes. MMSE:24  ? ? ?HPI:  Carly Rhodes is a 76 y.o. female and seen here as a transfer of care from Carly Rhodes. ?She reports suffering an acute Encephalopathy in May 2019,  Her headache was extraordinary, she called her PCP office and when she couldn't stand it anymore , she presented to EMS, and was admitted to [facility], ?She feels her memory has suffered progressively since. 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  ? ?She is a self described horrible pack rat and clutterer"  , or hoarder. She believes she had a viral brain infection.  ?Her husband passed away June 26, 2019, she passed out in front of his bed- had abdominal pain, was diagnosed with ? Pancreatitis from gall stone? A pancreatic sten was  placed- she doesn't remember her diagnosis. She resumed eloquis after surgery/  ? ?She is alone now, daughter lives in [place]. She lets nobody come into her house.  ?She has developed atrial fibrillation in her hospitalization time , but her cardiologist  ?. ? ?She is her for follow up of ? Cognitive impairment.  ?MMSE here 24/ 30 points.  ?Her time frame is off , her memory for names and words is affected. Short term memory is severely affected, yet she drove here by herself.  She doesn't get lost she stated , and she rides her bike 12 miles a day ( if that is true - WOW !).  ? ? ?This patient has no summarized history : by her won account she had a reading and writing learning disability as did her MGF and several uncles and aunts ts who never learn to write.  ?She finished HS, married twice, never could spell- this may still affect her  current performance when tested for memory. Non smoker, non drinker, caffeine ; coffee in AM.  ?Worked as a Social worker.  ?Has 2 children, but close contact to one daughter.  ?One daughter is bipolar, sex addiction.  ? ? ? ? ? And here is her ED and Hospital Record: [name] is a 76 y.o. female with medical history significant of recurrent HSV-1 infections, anxiety disorder, chronic pain syndrome on multiple narcotics and benzodiazepines who presented with headache and fever. Patient was seen and evaluated in the ER. She was initially seen by PCP 5 days ago. At that time she was told to come to the ER for evaluation but did not come in. She was given ciprofloxacin at the time for upper respiratory tract infection. In the ER today patient was confused with fever and headache. Suspected meningitis. Lumbar puncture was performed and patient was given Rocephin as well as acyclovir. She is still confused but arousable. Patient has recent UTI also that was treated. No history of dementia. She is now being admitted for workup and treatment. ED Course: Temperature is 102.4, pulse rate 147, respiratory of 31, blood pressure 93/65, Oxistat 90% on room air. She has a white count of 2.3 with neutropenia, platelets 69, sodium 1:30, potassium 3.3, chloride 98. Patient's creatinine is 0.82. Lumbar puncture performed results are currently pending. ? ?Carly Rhodes  follow up after hsopital : Carly Rhodes: ?HISTORY OF PRESENT ILLNESS: ?This is a 76 year old right-handed woman with a history of atrial fibrillation on Eliquis, hypothyroidism, anxiety, depression, presenting for evaluation after hospitalization for encephalopathy. She was under the impression that she had a brain infection at that time and wonders is the brain infection is causing her fatigue. Records from her hospitalization in May 2019 were reviewed and it was clarified with the patient that she did not have a brain infection during her hospital admission. She was  evaluated by Neurology and had an unremarkable MRI brain, EEG, and lumbar puncture, diagnosis of altered mental status in the setting of high fever, most likely explanation is viral syndrome with delirium with post-viral labyrinthitis. She presented to the hospital for headaches and diffuse weakness leading to a fall. She had not been eating for a few days. Her husband was unable to assist her off the floor. She woke up the next morning with speech difficulty, she could understand people but could not speak. She was febrile on arrival to the ED and was noted to be able to give history but show signs of altered mental status. She was found to have atrial fibrillation with RVR and started on Eliquis. I personally reviewed MRI brain with and without contrast done 01/13/18 which did not show any acute changes, there was mild diffuse atrophy, mild chronic microvascular disease. Cuts through the IACs showed normal 7th and 8th cranial nerves, no enhancing mass in temporal region. Her EEG was normal. She had a lumbar puncture with CSF showing WBC of 2, RBC 4400 in tub 1, 515 in tube 4, normal protein, glucose, negative HSV 1 and 2, negative CSF culture. She was treated empirically with acyclovir, ceftriaxone, and ampicillin, which were discontinued when cultures came back negative. During her stay, she developed vertigo and persistent right-beating nystagmus with rotary component in all directions, consistent with labyrinthitis. She described illusions/delusions during her stay.  ?  ?She denies any further headaches, vertigo, or hallucinations since her hospitalization in May 2019. Her main concern is her balance. She states most of the time it is good, but other times it is so bad, especially in an enclosed area. She has not fallen but has had to hold on to objects. She states most of the time she can correct herself. She denies any focal numbness/tingling/weakness, neck/back pain, bowel/bladder dysfunction. She states she  does not have headaches, but a place in the back of her head feels funny at times then hurts, she takes a Tylenol with good effect. She denies any vertigo, diplopia, dysarthria/dysphagia. She has noticed cognitive difficulties since her hospitalization in May. She is the primary caregiver of her 83 year old husband. They have been married for 3 years. She states she looks after him and does everything for him. He has a scooter for transfers, she does not need to help him to get around, but she bathes him once a week. She knows she needs help but does not know how to do it since "everything is so personal." She manages finances and has had more trouble since coming out of the hospital. She manages her husband's medications and states she does not forget them, but sometimes forgets her own medication. If she is unsure if she took the Eliquis, she would not take it that day.   END OF Carly> AQUINOs REPORT>  ? ?" ?  ?  ? ?Review of Systems: ?Out of a complete 14 system review, the  patient complains of only the following symptoms, and all other reviewed systems are negative. ? ?Dementia versus Cognitive impairment from encephalitis.  ? ?Social History  ? ?Socioeconomic History  ? Marital status: Widowed  ?  Spouse name: Not on file  ? Number of children: Not on file  ? Years of education: 17  ? Highest education level: Not on file  ?Occupational History  ? Not on file  ?Tobacco Use  ? Smoking status: Never  ? Smokeless tobacco: Never  ?Vaping Use  ? Vaping Use: Never used  ?Substance and Sexual Activity  ? Alcohol use: Never  ? Drug use: No  ? Sexual activity: Not Currently  ?  Birth control/protection: Post-menopausal, Surgical  ?Other Topics Concern  ? Not on file  ?Social History Narrative  ? Pt lives in 2 story home with her husband  ? 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 Right handed  ? Drinks 2-3 cups caffeine daily  ? ?Social Determinants of Health  ? ?Financial Resource Strain: Not on file  ?Food Insecurity: Not on file   ?Transportation Needs: Not on file  ?Physical Activity: Not on file  ?Stress: Not on file  ?Social Connections: Not on file  ?Intimate Partner Violence: Not on file  ? ? ?Family History  ?Problem Relati

## 2021-12-08 NOTE — Patient Instructions (Signed)
There are well-accepted and sensible ways to reduce risk for Alzheimers disease and other degenerative brain disorders . ? ?Exercise Daily Walk A daily 20 minute walk should be part of your routine. Disease related apathy can be a significant roadblock to exercise and the only way to overcome this is to make it a daily routine and perhaps have a reward at the end (something your loved one loves to eat or drink perhaps) or a personal trainer coming to the home can also be very useful. Most importantly, the patient is much more likely to exercise if the caregiver / spouse does it with him/her. In general a structured, repetitive schedule is best. ? ?General Health: Any diseases which effect your body will effect your brain such as a pneumonia, urinary infection, blood clot, heart attack or stroke. Keep contact with your primary care doctor for regular follow ups. ? ?Sleep. A good nights sleep is healthy for the brain. Seven hours is recommended. If you have insomnia or poor sleep habits we can give you some instructions. If you have sleep apnea wear your mask. ? ?Diet: Eating a heart healthy diet is also a good idea; fish and poultry instead of red meat, nuts (mostly non-peanuts), vegetables, fruits, olive oil or canola oil (instead of butter), minimal salt (use other spices to flavor foods), whole grain rice, bread, cereal and pasta and wine in moderation.Research is now showing that the MIND diet, which is a combination of The Mediterranean diet and the DASH diet, is beneficial for cognitive processing and longevity. Information about this diet can be found in The MIND Diet, a book by Doyne Keel, MS, RDN, and online at [employer]book[employer].si ? ?Finances, Power of Producer, television/film/video Directives: You should consider putting legal safeguards in place with regard to financial and medical decision making. While the spouse always has power of attorney for medical and financial issues in the  absence of any form, you should consider what you want in case the spouse / caregiver is no longer around or capable of making decisions.  ? ?The Alzheimers Association Position on Disease Prevention ? ?Can Alzheimer's be prevented? It's a question that continues to intrigue researchers and fuel new investigations. There are no clear-cut answers yet -- partially due to the need for more large-scale studies in diverse populations -- but promising research is under way. The Alzheimer's Association? is leading the worldwide effort to find a treatment for Alzheimer's, delay its onset and prevent it from developing.  ? ?What causes Alzheimer's? ?Experts agree that in the vast majority of cases, Alzheimer's, like other common chronic conditions, probably develops as a result of complex interactions among multiple factors, including age, genetics, environment, lifestyle and coexisting medical conditions. Although some risk factors -- such as age or genes -- cannot be changed, other risk factors -- such as high blood pressure and lack of exercise -- usually can be changed to help reduce risk. Research in these areas may lead to new ways to detect those at highest risk. ? ?Prevention studies ?A small percentage of people with Alzheimer's disease (less than 1 percent) have an early-onset type associated with genetic mutations. Individuals who have these genetic mutations are guaranteed to develop the disease. An ongoing clinical trial conducted by the [facility]herited [facility] (DIAN), is testing whether antibodies to beta-amyloid can reduce the accumulation of beta-amyloid plaque in the brains of people with such genetic mutations and thereby reduce, delay or prevent symptoms. Participants in the trial are receiving antibodies (  or placebo) before they develop symptoms, and the development of beta-amyloid plaques is being monitored by brain scans and other tests. ? ?Another clinical trial, known as the A4 trial  (Anti-Amyloid Treatment in Asymptomatic Alzheimer's), is testing whether antibodies to beta-amyloid can reduce the risk of Alzheimer's disease in older people (ages 36 to 35) at high risk for the disease. The A4 trial is being conducted by the Alzheimer's Disease Cooperative Study. ? ?Though research is still evolving, evidence is strong that people can reduce their risk by making key lifestyle changes, including participating in regular activity and maintaining good heart health. Based on this research, the Alzheimer's Association offers 10 Ways to Love Your Brain -- a collection of tips that can reduce the risk of cognitive decline. ? ?Heart-head connection ? ?New research shows there are things we can do to reduce the risk of mild cognitive impairment and dementia. ? ?Several conditions known to increase the risk of cardiovascular disease -- such as high blood pressure, diabetes and high cholesterol -- also increase the risk of developing Alzheimer's. Some autopsy studies show that as many as 28 percent of individuals with Alzheimer's disease also have cardiovascular disease. ? ?A longstanding question is why some people develop hallmark Alzheimer's plaques and tangles but do not develop the symptoms of Alzheimer's. Vascular disease may help researchers eventually find an answer. Some autopsy studies suggest that plaques and tangles may be present in the brain without causing symptoms of cognitive decline unless the brain also shows evidence of vascular disease. More research is needed to better understand the link between vascular health and Alzheimer's. ? ?Physical exercise and diet ?Regular physical exercise may be a beneficial strategy to lower the risk of Alzheimer's and vascular dementia. Exercise may directly benefit brain cells by increasing blood and oxygen flow in the brain. Because of its known cardiovascular benefits, a medically approved exercise program is a valuable part of any overall wellness  plan. ? ?Current evidence suggests that heart-healthy eating may also help protect the brain. Heart-healthy eating includes limiting the intake of sugar and saturated fats and making sure to eat plenty of fruits, vegetables, and whole grains. No one diet is best. Two diets that have been studied and may be beneficial are the DASH (Dietary Approaches to Stop Hypertension) diet and the Mediterranean diet. The DASH diet emphasizes vegetables, fruits and fat-free or low-fat dairy products; includes whole grains, fish, poultry, beans, seeds, nuts and vegetable oils; and limits sodium, sweets, sugary beverages and red meats. A Mediterranean diet includes relatively little red meat and emphasizes whole grains, fruits and vegetables, fish and shellfish, and nuts, olive oil and other healthy fats. ? ?Social connections and intellectual activity ?A number of studies indicate that maintaining strong social connections and keeping mentally active as we age might lower the risk of cognitive decline and Alzheimer's. Experts are not certain about the reason for this association. It may be due to direct mechanisms through which social and mental stimulation strengthen connections between nerve cells in the brain. ? ?Head trauma ?There appears to be a strong link between future risk of Alzheimer's and serious head trauma, especially when injury involves loss of consciousness. You can help reduce your risk of Alzheimer's by protecting your head. ? Wear a seat belt ? Use a helmet when participating in sports ? "Fall-proof" your home ?  ?What you can do now ?While research is not yet conclusive, certain lifestyle choices, such as physical activity and diet, may help support brain  health and prevent Alzheimer's. Many of these lifestyle changes have been shown to lower the risk of other diseases, like heart disease and diabetes, which have been linked to Alzheimer's. With few drawbacks and plenty of known benefits, healthy lifestyle  choices can improve your health and possibly protect your brain. ? ?Learn more about brain health. ?You can help increase our knowledge by considering participation in a clinical study. Our free clinical trial matc

## 2021-12-12 DIAGNOSIS — E538 Deficiency of other specified B group vitamins: Secondary | ICD-10-CM | POA: Diagnosis not present

## 2021-12-25 ENCOUNTER — Encounter: Payer: Self-pay | Admitting: Gastroenterology

## 2021-12-26 DIAGNOSIS — E538 Deficiency of other specified B group vitamins: Secondary | ICD-10-CM | POA: Diagnosis not present

## 2022-01-09 DIAGNOSIS — E538 Deficiency of other specified B group vitamins: Secondary | ICD-10-CM | POA: Diagnosis not present

## 2022-01-23 DIAGNOSIS — E538 Deficiency of other specified B group vitamins: Secondary | ICD-10-CM | POA: Diagnosis not present

## 2022-02-06 DIAGNOSIS — E538 Deficiency of other specified B group vitamins: Secondary | ICD-10-CM | POA: Diagnosis not present

## 2022-02-10 DIAGNOSIS — R197 Diarrhea, unspecified: Secondary | ICD-10-CM | POA: Diagnosis not present

## 2022-02-11 DIAGNOSIS — R197 Diarrhea, unspecified: Secondary | ICD-10-CM | POA: Diagnosis not present

## 2022-02-23 DIAGNOSIS — E538 Deficiency of other specified B group vitamins: Secondary | ICD-10-CM | POA: Diagnosis not present

## 2022-03-13 DIAGNOSIS — E538 Deficiency of other specified B group vitamins: Secondary | ICD-10-CM | POA: Diagnosis not present

## 2022-03-14 ENCOUNTER — Other Ambulatory Visit: Payer: Self-pay | Admitting: Internal Medicine

## 2022-03-14 DIAGNOSIS — I482 Chronic atrial fibrillation, unspecified: Secondary | ICD-10-CM

## 2022-03-16 NOTE — Telephone Encounter (Signed)
Eliquis '5mg'$  refill request received. Patient is 76 years old, weight-61.9kg, Crea-0.70 on 09/16/2021 via [facility] from [place], [place], and last seen by Dr. Lovena Le on 12/08/2021. Dose is appropriate based on dosing criteria. Will send in refill to requested pharmacy.

## 2022-03-20 DIAGNOSIS — F419 Anxiety disorder, unspecified: Secondary | ICD-10-CM | POA: Diagnosis not present

## 2022-03-20 DIAGNOSIS — E538 Deficiency of other specified B group vitamins: Secondary | ICD-10-CM | POA: Diagnosis not present

## 2022-03-20 DIAGNOSIS — Z79899 Other long term (current) drug therapy: Secondary | ICD-10-CM | POA: Diagnosis not present

## 2022-03-20 DIAGNOSIS — I480 Paroxysmal atrial fibrillation: Secondary | ICD-10-CM | POA: Diagnosis not present

## 2022-03-20 DIAGNOSIS — G4700 Insomnia, unspecified: Secondary | ICD-10-CM | POA: Diagnosis not present

## 2022-03-20 DIAGNOSIS — E782 Mixed hyperlipidemia: Secondary | ICD-10-CM | POA: Diagnosis not present

## 2022-03-27 DIAGNOSIS — E538 Deficiency of other specified B group vitamins: Secondary | ICD-10-CM | POA: Diagnosis not present

## 2022-04-02 ENCOUNTER — Inpatient Hospital Stay: Admission: RE | Admit: 2022-04-02 | Payer: PPO | Source: Ambulatory Visit

## 2022-04-10 DIAGNOSIS — E538 Deficiency of other specified B group vitamins: Secondary | ICD-10-CM | POA: Diagnosis not present

## 2022-04-24 DIAGNOSIS — E538 Deficiency of other specified B group vitamins: Secondary | ICD-10-CM | POA: Diagnosis not present

## 2022-05-07 ENCOUNTER — Ambulatory Visit (INDEPENDENT_AMBULATORY_CARE_PROVIDER_SITE_OTHER): Payer: PPO | Admitting: Sports Medicine

## 2022-05-07 DIAGNOSIS — R269 Unspecified abnormalities of gait and mobility: Secondary | ICD-10-CM

## 2022-05-07 DIAGNOSIS — M21611 Bunion of right foot: Secondary | ICD-10-CM

## 2022-05-07 DIAGNOSIS — M2011 Hallux valgus (acquired), right foot: Secondary | ICD-10-CM

## 2022-05-07 MED ORDER — ACETAMINOPHEN-CODEINE 300-30 MG PO TABS: ORAL_TABLET | ORAL | 3 refills | Status: DC

## 2022-05-07 NOTE — Assessment & Plan Note (Signed)
Once orthotics were completed she was walking with better comfort Pronation was well controlled  We have added these to most shoes she uses She can return as needed for replacements

## 2022-05-07 NOTE — Progress Notes (Signed)
Chief complaint chronic foot pain  Patient has been in orthotics for many years.  As she has gotten older she mainly rides a bicycle for exercise.  However she does try to walk most days and she gets pain unless she uses her orthotics.  She has a pronated gait with a significant bunion. Orthotic use has lessen the amount of pain she gets in the left hip which has known osteoarthritis.  Most of her knee pain has resolved.  Physical exam Pleasant older lady in no acute distress BP 98/60   Ht 5' 5.5" (1.664 m)   BMI 22.37 kg/m   On the right foot she has significant hallux valgus and resting pronation On the left foot she has loss of the longitudinal arch Both feet show some widening of the transverse arch at the plantar plate Hip today is not tender and she is walking without a limp that has been caused by the left hip in the past  Patient was fitted for a : standard, cushioned, semi-rigid orthotic. The orthotic was heated and afterward the patient stood on the orthotic blank positioned on the orthotic stand. The patient was positioned in subtalar neutral position and 10 degrees of ankle dorsiflexion in a weight bearing stance. After completion of molding, a stable base was applied to the orthotic blank. The blank was ground to a stable position for weight bearing. Size: 10 Red Med EVA Base: Blue medium desnisty EVA Posting: Bilateral scaphoid pads Additional orthotic padding: None

## 2022-05-07 NOTE — Assessment & Plan Note (Signed)
Bunion today is nonpainful We continue to build up her longitudinal arch to take pressure off the bunion After completion of orthotics today she felt significantly less pressure

## 2022-05-07 NOTE — Progress Notes (Signed)
  [place] - 76 y.o. female MRN [id number]  Date of birth:

## 2022-05-15 DIAGNOSIS — E538 Deficiency of other specified B group vitamins: Secondary | ICD-10-CM | POA: Diagnosis not present

## 2022-05-27 ENCOUNTER — Ambulatory Visit: Payer: PPO | Admitting: Neurology

## 2022-05-27 ENCOUNTER — Encounter: Payer: Self-pay | Admitting: Neurology

## 2022-05-27 ENCOUNTER — Telehealth: Payer: Self-pay | Admitting: Neurology

## 2022-05-27 DIAGNOSIS — M545 Low back pain, unspecified: Secondary | ICD-10-CM

## 2022-05-27 DIAGNOSIS — G4700 Insomnia, unspecified: Secondary | ICD-10-CM

## 2022-05-27 DIAGNOSIS — F341 Dysthymic disorder: Secondary | ICD-10-CM

## 2022-05-27 MED ORDER — BELSOMRA 15 MG PO TABS: 15.0000 mg | ORAL_TABLET | Freq: Every evening | ORAL | 0 refills | Status: AC | PRN

## 2022-05-27 MED ORDER — BELSOMRA 15 MG PO TABS
ORAL_TABLET | ORAL | 2 refills | Status: DC
Start: 2022-05-27 — End: 2022-05-27

## 2022-05-27 MED ORDER — TRAZODONE HCL 100 MG PO TABS: 150.0000 mg | ORAL_TABLET | Freq: Every day | ORAL | 0 refills | Status: AC

## 2022-05-27 NOTE — Patient Instructions (Signed)
Suvorexant Tablets What is this medication? SUVOREXANT (SOO voe REX ant) treats insomnia. It helps you go to sleep faster and stay asleep through the night. This medicine may be used for other purposes; ask your health care provider or pharmacist if you have questions. COMMON BRAND NAME(S): Belsomra What should I tell my care team before I take this medication? They need to know if you have any of these conditions: Depression History of substance abuse or addiction History of a sudden onset of muscle weakness (cataplexy) History of falling asleep often at unexpected times (narcolepsy) If you often drink alcohol Liver disease Lung or breathing disease Sleep apnea Sleep-walking, driving, eating or other activity while not fully awake after taking a sleep medication Suicidal thoughts, plans, or attempt; a previous suicide attempt by you or a family member An unusual or allergic reaction to suvorexant, other medications, foods, dyes, or preservatives Pregnant or trying to get pregnant Breast-feeding How should I use this medication? Take this medication by mouth with water 30 minutes before going to bed. Follow the directions on the prescription label. It is better to take this medication on an empty stomach. Do not take your medication more often than directed. A special MedGuide will be given to you by the pharmacist with each prescription and refill. Be sure to read this information carefully each time. Talk to your care team about the use of this medication in children. Special care may be needed. Overdosage: If you think you have taken too much of this medicine contact a poison control center or emergency room at once. NOTE: This medicine is only for you. Do not share this medicine with others. What if I miss a dose? This does not apply. This medication should only be taken as directed before going to sleep. Do not take double or extra doses. What may interact with this  medication? Alcohol Antihistamines for allergy, cough, or cold Certain antibiotics, such as erythromycin or clarithromycin Certain antivirals for HIV or hepatitis Certain medications for fungal infections, such as itraconazole, ketoconazole, posaconazole Certain medications for mental health conditions Certain medications for seizures, such as carbamazepine, phenytoin, phenobarbital Conivaptan Diltiazem General anesthetics, such as halothane, isoflurane, methoxyflurane, propofol Grapefruit juice Medications that relax muscles for surgery Opioid medications for pain Other medications for sleep Rifampin St. John's Wort Verapamil This list may not describe all possible interactions. Give your health care provider a list of all the medicines, herbs, non-prescription drugs, or dietary supplements you use. Also tell them if you smoke, drink alcohol, or use illegal drugs. Some items may interact with your medicine. What should I watch for while using this medication? Visit your care team for regular checks on your progress. Keep a regular sleep schedule by going to bed at about the same time each night. Avoid caffeine-containing drinks in the evening hours. Talk to your care team if your insomnia worsens or is not better within 7 to 10 days. After taking this medication, you may get up out of bed and do an activity that you do not know you are doing. The next morning, you may have no memory of this. Activities include driving a car ("sleep-driving"), making and eating food, talking on the phone, sexual activity, and sleep-walking. Serious injuries have occurred. Stop the medication and call your care team right away if you find out you have done any of these activities. Do not take this medication if you have used alcohol that evening. Do not take it if  you have taken another medication for sleep. The risk of doing these sleep-related activities is higher. Do not take this medication unless you are  able to stay in bed for a full night (7 to 8 hours) before you must be active again. Tell your care team if you will need to perform activities requiring full alertness, such as driving, the next day. You may have a decrease in mental alertness the day after use, even if you feel that you are fully awake. Do not stand or sit up quickly after taking this medication, especially if you are an older patient. This reduces the risk of dizzy or fainting spells. If you or your family notice any changes in your moods or behavior, such as new or worsening depression, thoughts of harming yourself, anxiety, other unusual or disturbing thoughts, or memory loss, call your care team right away. After you stop taking this medication, you may have trouble falling asleep. This is called rebound insomnia. This problem usually goes away on its own after 1 or 2 nights. What side effects may I notice from receiving this medication? Side effects that you should report to your care team as soon as possible: Allergic reactions--skin rash, itching, hives, swelling of the face, lips, tongue, or throat CNS depression--slow or shallow breathing, shortness of breath, feeling faint, dizziness, confusion, trouble staying awake Mood and behavior changes--anxiety, nervousness, confusion, hallucinations, irritability, hostility, thoughts of suicide or self-harm, worsening mood, feelings of depression Sudden and temporary muscle weakness Unable to move or speak for several minutes upon waking or going to sleep Unusual sleep behaviors or activities you do not remember, such as driving, eating, or sexual activity Side effects that usually do not require medical attention (report these to your care team if they continue or are bothersome): Drowsiness the day after use Vivid dreams or nightmares This list may not describe all possible side effects. Call your doctor for medical advice about side effects. You may report side effects to FDA at  1-800-FDA-1088. Where should I keep my medication? Keep out of the reach of children and pets. This medication can be abused. Keep it in a safe place to protect it from theft. Do not share it with anyone. It is only for you. Selling or giving away this medication is dangerous and against the law. Store between 20 and 25 degrees C (68 and 77 degrees F). Protect from light and moisture. Keep the container tightly closed. Get rid of any unused medication after the expiration date. This medication may cause harm and death if it is taken by other adults, children, or pets. It is important to get rid of the medication as soon as you no longer need it or it is expired. You can do this in two ways: Take the medication to a medication take-back program. Check with your pharmacy or law enforcement to find a location. If you cannot return the medication, check the label or package insert to see if the medication should be thrown out in the garbage or flushed down the toilet. If you are not sure, ask your care team. If it is safe to put it in the trash, take the medication out of the container. Mix the medication with cat litter, dirt, coffee grounds, or other unwanted substance. Seal the mixture in a bag or container. Put it in the trash. NOTE: This sheet is a summary. It may not cover all possible information. If you have questions about this medicine, talk to your doctor, pharmacist,  or health care provider.  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 Steady memory test results.  Uses xanax for insomnia - and trazodone. Wants to find an alternative for xanax or to alternate, and trazodone is not that.  Baclofen is helpful to relax.  It is unclear if she takes Aricept at night 5 mg, this could cause nightmares. She thinks it was not refilled since dr Jannifer Franklin.  B12 deficiency   Plan:  Treatment plan and additional workup :  Insomnia for  sleep initiation , nightmares. Has used xanax - not optimal at her age. I will make suggestions as to increase Trazodone on her list, 150 mg.   Xanax  0.25 prn po alternating with Belsomra 15 mg, not together !   The patient needs yearly visit for memory function. 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   Yearly RV with MMSE- if the score is declining ,I would refer to neuro-psychological testing.

## 2022-05-27 NOTE — Telephone Encounter (Signed)
Pt states the RN was not suppose to call in the traZODone (DESYREL) 100 MG tablet .  She was however supposed  to have  Suvorexant (BELSOMRA) 15 MG TABS called in to [place]

## 2022-05-27 NOTE — Progress Notes (Signed)
Provider:  [name], M D  Referring Provider: [name], MD Primary Care Physician:  [name], MD  Chief Complaint  Patient presents with   Follow-up    Pt in room #10 and alone. Pt here today for hx of encephalopathy and memory. Pt has question about her Xanax Rx and how many to take.    HPI:  Carly Rhodes is a 76 y.o. female and seen here  in her first RV on 05-27-2022, She is not here for encephalopathy- she had a regular memory appointment in another 6 months form now but she wants to discuss sleep aids. She is an insomniac , chronic since age 1 and she was taking xanax.  " I dream of the people I have lost and trying to get them back to life, I cry in my sleep". Lexpro has helped with depression. She reports being susceptible to viral infections and has a constant fear of getting sick again.     She was a transfer patient from Dr Jannifer Franklin. She reports suffering an acute Encephalopathy in May 2019,  Her headache was extraordinary, she called her PCP office and when she "couldn't stand it anymore" , she presented to EMS, and was admitted to [facility], She feels her memory has suffered progressively since. 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   She is a self described horrible pack rat and clutterer", or hoarder. She believes she had a viral brain infection.  Her husband passed away 2019-06-11, she passed out in front of his bed- had abdominal pain, was diagnosed with ? Pancreatitis from gall stone? A pancreatic sten was  placed- she doesn't remember her diagnosis. She resumed eloquis after surgery/   She is alone now, daughter lives in [place]. She lets nobody come into her house.  She has developed atrial fibrillation in her hospitalization time , but her cardiologist.  She is her for follow up of ? Cognitive impairment.  MMSE here 24/ 30 points.  Her time frame is off , her memory for names and words is affected.  Short term memory is severely affected, yet she drove here by herself.  She doesn't get lost she stated , and she rides her bike 12 miles a day ( if that is true - WOW !).    This patient has no summarized history : by her won account she had a reading and writing learning disability as did her MGF and several uncles and aunts ts who never learn to write.  She finished HS, married twice, never could spell- this may still affect her current performance when tested for memory. Non smoker, non drinker, caffeine ; coffee in AM.  Worked as a Social worker.  Has 2 children, but close contact to one daughter.  One daughter is bipolar, sex addiction.       And here is her ED and Hospital Record: Carly Stann Mainland is a 76 y.o. female with medical history significant of recurrent HSV-1 infections, anxiety disorder, chronic pain syndrome on multiple narcotics and benzodiazepines who presented with headache and fever. Patient was seen and evaluated in the ER. She was initially seen by PCP 5 days ago. At that time she was told to come to the ER for evaluation but did not come in. She was given ciprofloxacin at the time for upper respiratory tract infection. In the ER today patient was confused with fever and headache. Suspected meningitis. Lumbar puncture was performed and  patient was given Rocephin as well as acyclovir. She is still confused but arousable. Patient has recent UTI also that was treated. No history of dementia. She is now being admitted for workup and treatment. ED Course: Temperature is 102.4, pulse rate 147, respiratory of 31, blood pressure 93/65, Oxistat 90% on room air. She has a white count of 2.3 with neutropenia, platelets 69, sodium 1:30, potassium 3.3, chloride 98. Patient's creatinine is 0.82. Lumbar puncture performed results are currently pending.  Dr [name] follow up after hsopital : Dr [name]: HISTORY OF PRESENT ILLNESS: This is a 76 year old right-handed woman with a history of  atrial fibrillation on Eliquis, hypothyroidism, anxiety, depression, presenting for evaluation after hospitalization for encephalopathy. She was under the impression that she had a brain infection at that time and wonders is the brain infection is causing her fatigue. Records from her hospitalization in May 2019 were reviewed and it was clarified with the patient that she did not have a brain infection during her hospital admission. She was evaluated by Neurology and had an unremarkable MRI brain, EEG, and lumbar puncture, diagnosis of altered mental status in the setting of high fever, most likely explanation is viral syndrome with delirium with post-viral labyrinthitis. She presented to the hospital for headaches and diffuse weakness leading to a fall. She had not been eating for a few days. Her husband was unable to assist her off the floor. She woke up the next morning with speech difficulty, she could understand people but could not speak. She was febrile on arrival to the ED and was noted to be able to give history but show signs of altered mental status. She was found to have atrial fibrillation with RVR and started on Eliquis. I personally reviewed MRI brain with and without contrast done 01/13/18 which did not show any acute changes, there was mild diffuse atrophy, mild chronic microvascular disease. Cuts through the IACs showed normal 7th and 8th cranial nerves, no enhancing mass in temporal region. Her EEG was normal. She had a lumbar puncture with CSF showing WBC of 2, RBC 4400 in tub 1, 515 in tube 4, normal protein, glucose, negative HSV 1 and 2, negative CSF culture. She was treated empirically with acyclovir, ceftriaxone, and ampicillin, which were discontinued when cultures came back negative. During her stay, she developed vertigo and persistent right-beating nystagmus with rotary component in all directions, consistent with labyrinthitis. She described illusions/delusions during her stay.    She  denies any further headaches, vertigo, or hallucinations since her hospitalization in May 2019. Her main concern is her balance. She states most of the time it is good, but other times it is so bad, especially in an enclosed area. She has not fallen but has had to hold on to objects. She states most of the time she can correct herself. She denies any focal numbness/tingling/weakness, neck/back pain, bowel/bladder dysfunction. She states she does not have headaches, but a place in the back of her head feels funny at times then hurts, she takes a Tylenol with good effect. She denies any vertigo, diplopia, dysarthria/dysphagia. She has noticed cognitive difficulties since her hospitalization in May. She is the primary caregiver of her 22 year old husband. They have been married for 3 years. She states she looks after him and does everything for him. He has a scooter for transfers, she does not need to help him to get around, but she bathes him once a week. She knows she needs help but  does not know how to do it since "everything is so personal." She manages finances and has had more trouble since coming out of the hospital. She manages her husband's medications and states she does not forget them, but sometimes forgets her own medication. If she is unsure if she took the Eliquis, she would not take it that day.   END OF DR> AQUINOs REPORT>   "      Review of Systems: Out of a complete 14 system review, the patient complains of only the following symptoms, and all other reviewed systems are negative.  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   Insomnia, nightmare, " I dream of the people I have lost and trying to get them back to life, I cry in my sleep".  Lexpro has helped with depression.   "I am bored, I can't have salt, sugar , nor fried food. " My quality of life is not as good.   Marland KitchenGDS : 4/ 15 points.   Social History   Socioeconomic History   Marital status: Widowed    Spouse  name: Not on file   Number of children: Not on file   Years of education: 12   Highest education level: Not on file  Occupational History   Not on file  Tobacco Use   Smoking status: Never   Smokeless tobacco: Never  Vaping Use   Vaping Use: Never used  Substance and Sexual Activity   Alcohol use: Never   Drug use: No   Sexual activity: Not Currently    Birth control/protection: Post-menopausal, Surgical  Other Topics Concern   Not on file  Social History Narrative   Pt lives in 2 story home with her husband   Current care giver to husband   Right handed   Drinks 2-3 cups caffeine daily   Social Determinants of Health   Financial Resource Strain: Not on file  Food Insecurity: Not on file  Transportation Needs: Not on file  Physical Activity: Not on file  Stress: Not on file  Social Connections: Not on file  Intimate Partner Violence: Not on file    Family History  Problem Relation Age of Onset   Stomach cancer Brother 63   Colon cancer Paternal Grandmother 80   Diabetes Father    Hypertension Sister    Heart attack Neg Hx     Past Medical History:  Diagnosis Date   Anxiety    Atrial fibrillation ([place])    Choledocholithiasis    Depression    Dysrhythmia    Family history of anesthesia complication    sister extreme nausea & vomiting   Hepatitis 1953   "contagious type"   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   Osteoporosis    Seasonal allergies     Past Surgical History:  Procedure Laterality Date   Harmony   at time of Hysterectomy   BILIARY DILATION  07/21/2019   Procedure: BILIARY DILATION;  Surgeon: Irving Copas., MD;  Location: [place];  Service: Gastroenterology;;   CHOLECYSTECTOMY N/A 07/22/2019   Procedure: LAPAROSCOPIC CHOLECYSTECTOMY;  Surgeon: Clovis Riley, MD;  Location: [place];  Service: General;  Laterality: N/A;   ERCP N/A 07/21/2019   Procedure: ENDOSCOPIC RETROGRADE  CHOLANGIOPANCREATOGRAPHY (ERCP);  Surgeon: Irving Copas., MD;  Location: [place];  Service: Gastroenterology;  Laterality: N/A;   ERCP     HAMMER TOE SURGERY  1993   2nd toe left foot   PANCREATIC  STENT PLACEMENT  07/21/2019   Procedure: PANCREATIC STENT PLACEMENT;  Surgeon: Rush Landmark Telford Nab., MD;  Location: [place];  Service: Gastroenterology;;   REMOVAL OF STONES  07/21/2019   Procedure: REMOVAL OF STONES;  Surgeon: Irving Copas., MD;  Location: [place];  Service: Gastroenterology;;   [name]  07/21/2019   Procedure: SPHINCTEROTOMY;  Surgeon: Irving Copas., MD;  Location: [place];  Service: Gastroenterology;;   THYROID LOBECTOMY  09/15/2012   Procedure: THYROID LOBECTOMY;  Surgeon: Melissa Montane, MD;  Location: [place];  Service: ENT;  Laterality: Left;   THYROIDECTOMY, PARTIAL  09/15/2012   Dr Janace Hoard   TONSILLECTOMY  1957    Current Outpatient Medications  Medication Sig Dispense Refill   acetaminophen-codeine (TYLENOL #3) 300-30 MG tablet Take one tab at bedtime as needed 30 tablet 3   acetaminophen-codeine (TYLENOL/CODEINE #3) 300-30 MG tablet One qhs 30 tablet 3   acyclovir (ZOVIRAX) 400 MG tablet Take 1 tablet (400 mg total) by mouth every 6 (six) hours as needed. 30 tablet 2   ALPRAZolam (XANAX) 0.5 MG tablet TAKE 1/2 (ONE-HALF) TO 1 TABLET BY MOUTH THREE TIMES DAILY AS NEEDED 60 tablet 0   apixaban (ELIQUIS) 5 MG TABS tablet Take 1 tablet by mouth twice daily 60 tablet 5   Ascorbic Acid (VITAMIN C PO) Take 1 tablet by mouth daily.     B-D TB SYRINGE 1CC/25GX5/8" 25G X 5/8" 1 ML MISC USE FOR B12 INJECTIONS     baclofen (LIORESAL) 10 MG tablet TAKE 2 TABLETS BY MOUTH AT BEDTIME 60 tablet 0   calcium carbonate (OS-CAL - DOSED IN MG OF ELEMENTAL CALCIUM) 1250 (500 Ca) MG tablet Take 1 tablet by mouth.     cyanocobalamin (,VITAMIN B-12,) 1000 MCG/ML injection INJECT 1 ML (CC) INTRAMUSCULARLY ONCE EVERY 30 DAYS 3 mL 0   donepezil  (ARICEPT) 5 MG tablet Take 1 tablet (5 mg total) by mouth at bedtime. 30 tablet 2   escitalopram (LEXAPRO) 5 MG tablet 1 tablet     ezetimibe (ZETIA) 10 MG tablet Take 1 tablet by mouth once daily 90 tablet 0   Multiple Vitamin (MULTIVITAMIN WITH MINERALS) TABS tablet Take 1 tablet by mouth daily. Centrum     NEEDLE, DISP, 25 G (BD ECLIPSE NEEDLE) 25G X 5/8" MISC Use for B12 injections 100 each 3   traZODone (DESYREL) 100 MG tablet TAKE 1 TABLET BY MOUTH AT BEDTIME 30 tablet 0   No current facility-administered medications for this visit.    Allergies as of 05/27/2022 - Review Complete 05/27/2022  Allergen Reaction Noted   Amiodarone Other (See Comments) 08/11/2018   Procaine Other (See Comments) 09/09/2012    Vitals: BP (!) 141/75 (BP Location: Right Arm, Patient Position: Sitting, Cuff Size: Normal)   Pulse (!) 56   Ht '5\' 5"'$  (1.651 m)   Wt 135 lb (61.2 kg)   BMI 22.47 kg/m  Last Weight:  Wt Readings from Last 1 Encounters:  05/27/22 135 lb (61.2 kg)   Last Height:   Ht Readings from Last 1 Encounters:  05/27/22 '5\' 5"'$  (1.651 m)    Physical exam:  General: The patient is awake, alert and appears not in acute distress. The patient is well groomed. Head: Normocephalic, atraumatic.  She is animated, with strong words and gestured. .  Neck is supple. Mallampati; 1, neck circumference:13" Cardiovascular:  Regular rate and rhythm, without  murmurs or carotid bruit, and without distended neck veins. Respiratory: Lungs are clear to auscultation. Skin:  With  evidence of ankle  edema,no rash Trunk: BMI is 22.3,normal posture.  Neurologic exam : The patient is awake and alert, oriented to place and time.  Memory subjective described as impaired.  mini-mental status exam    05/27/2022    8:20 AM 12/08/2021    3:44 PM 04/07/2021    2:42 PM  MMSE - Mini Mental State Exam  Orientation to time '5 4 5  '$ Orientation to Place '5 5 5  '$ Registration '3 3 3  '$ Attention/ Calculation 1 1 0   Recall '3 2 2  '$ Language- name 2 objects '2 2 2  '$ Language- repeat 1 1 0  Language- follow 3 step command '3 3 3  '$ Language- read & follow direction '1 1 1  '$ Write a sentence '1 1 1  '$ Copy design '1 1 1  '$ Total score '26 24 23        '$ 08/28/2019   10:00 AM 03/16/2019    8:00 AM 08/02/2018    1:00 PM  Montreal Cognitive Assessment   Visuospatial/ Executive (0/5)  2 4  Naming (0/3)  3 3  Attention: Read list of digits (0/2) '1 2 2  '$ Attention: Read list of letters (0/1) 1 1 0  Attention: Serial 7 subtraction starting at 100 (0/3) '2 1 1  '$ Language: Repeat phrase (0/2) '1 2 1  '$ Language : Fluency (0/1) 0 0 0  Abstraction (0/2) 0 0 0  Delayed Recall (0/5) '4 3 2  '$ Orientation (0/6) '6 6 6  '$ Total  20 19  Adjusted Score (based on education)  21 20     There is a reduced attention span & concentration ability.  Speech is fluent without dysarthria, dysphonia but there are difficulties in naming and completing sentences. Mood and affect are appropriate.  Cranial nerves: Pupils are equal and briskly reactive to light. Funduscopic exam without  evidence of pallor or edema. Extraocular movements  in vertical and horizontal planes intact and without nystagmus.  Visual fields by finger perimetry are intact. Hearing to finger rub intact.   Facial sensation intact to fine touch. Facial motor strength is symmetric and tongue and uvula move midline. Tongue protrusion into either cheek is normal. Shoulder shrug is normal.   Motor exam:   Normal tone ,muscle bulk and symmetric  strength in all extremities.  Sensory:  she shrieked in response to tuning fork testing on either knee, " it feels like an electric shock" Fine touch, pinprick and vibration were tested in all extremities. Proprioception was normal.  Coordination: Rapid alternating movements in the fingers/hands were normal. Finger-to-nose maneuver normal without evidence of ataxia, dysmetria or tremor.  Gait and station: Patient walks without assistive  device . Proximal and distal strength within normal limits. Stance is stable.  She walks with a slightly wider based gait, full speed , turns with 3 steps.  Romberg testing is negative.   Deep tendon reflexes: in the  upper and lower extremities are symmetric and intact. Babinski maneuver response is  downgoing.   Assessment:  After physical and neurologic examination, review of laboratory studies, imaging, neurophysiology testing and pre-existing records, 40 minutes assessment is that of : Abnormal MRI brain showed an non-enhancing lacunar infarct ( age unknown - but after 2018) , usually the risk factor is hypertension ,migraines, both conditions she doesn't have.    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 Steady memory test results.  Uses xanax for insomnia - and trazodone. Wants to find an alternative for xanax or  to alternate, and trazodone is not that.  Baclofen is helpful to relax.  It is unclear if she takes Aricept at night 5 mg, this could cause nightmares. She thinks it was not refilled since dr Jannifer Franklin.  B12 deficiency   Plan:  Treatment plan and additional workup :  Insomnia for sleep initiation , nightmares. Has used xanax - not optimal at her age. I will make suggestions as to increase Trazodone on her list, 150 mg.   Xanax  0.25 prn po alternating with Belsomra 15 mg, not together ! XANAX is not prescribed by [facility] .  The patient needs yearly visit for memory function. MMSE yearly  Dr [name] diagnosed a  VIT B 12 deficiency , which has been supplemented, IM, and is still given 05-27-2022  Dr Mannie Stabile at [facility] will follow up on labs, I will ask her to share results with me.   Yearly RV with MMSE- if the score is declining ,I would refer to neuro-psychological testing.     CC Dr. Mannie Stabile.   Asencion Partridge Jalesia Loudenslager MD 05/27/2022

## 2022-05-27 NOTE — Addendum Note (Signed)
Addended by: Darleen Crocker on: 05/27/2022 11:43 AM   Modules accepted: Orders

## 2022-05-27 NOTE — Telephone Encounter (Signed)
Called the patient back. There was no answer. And VM was full. Unable to LVM.  Dr Dohmeier sent both trazodone and belsomra into [place] for the patient. According to her notes from the visit today, Dr Dohmeier increased her trazodone to 150 mg at bedtime. She wrote Belsomra for the patient to use as needed and alternate with the xanax (xanax is not ordered by [place])  "Insomnia for sleep initiation , nightmares. Has used xanax - not optimal at her age. I will make suggestions as to increase Trazodone on her list, 150 mg.  Xanax  0.25 prn po alternating with Belsomra 15 mg, not together ! Duanne Moron is not prescribed by [facility] ."  **If patient calls back, please advise the above information tat the patient is to increase the trazodone to 150 mg which is 1.5 tablet at bedtime. The belsomra was sent to the pharmacy via fax, but I will likely need to resend to clarify the directions. I will have Dr Brett Fairy resend this for the patient.

## 2022-05-29 DIAGNOSIS — E538 Deficiency of other specified B group vitamins: Secondary | ICD-10-CM | POA: Diagnosis not present

## 2022-06-12 DIAGNOSIS — Z23 Encounter for immunization: Secondary | ICD-10-CM | POA: Diagnosis not present

## 2022-06-12 DIAGNOSIS — E538 Deficiency of other specified B group vitamins: Secondary | ICD-10-CM | POA: Diagnosis not present

## 2022-06-26 DIAGNOSIS — E538 Deficiency of other specified B group vitamins: Secondary | ICD-10-CM | POA: Diagnosis not present

## 2022-07-06 ENCOUNTER — Ambulatory Visit: Payer: PPO | Admitting: Neurology

## 2022-07-08 DIAGNOSIS — E538 Deficiency of other specified B group vitamins: Secondary | ICD-10-CM | POA: Diagnosis not present

## 2022-07-22 DIAGNOSIS — E538 Deficiency of other specified B group vitamins: Secondary | ICD-10-CM | POA: Diagnosis not present

## 2022-08-05 DIAGNOSIS — E538 Deficiency of other specified B group vitamins: Secondary | ICD-10-CM | POA: Diagnosis not present

## 2022-08-21 DIAGNOSIS — E538 Deficiency of other specified B group vitamins: Secondary | ICD-10-CM | POA: Diagnosis not present

## 2022-09-04 DIAGNOSIS — E538 Deficiency of other specified B group vitamins: Secondary | ICD-10-CM | POA: Diagnosis not present

## 2022-09-15 ENCOUNTER — Other Ambulatory Visit: Payer: Self-pay | Admitting: Internal Medicine

## 2022-09-15 DIAGNOSIS — I482 Chronic atrial fibrillation, unspecified: Secondary | ICD-10-CM

## 2022-09-16 NOTE — Telephone Encounter (Signed)
Prescription refill request for Eliquis received. Indication: a fib Last office visit: 12/05/21 Lovena Le Scr: 0.76 KPN 03/20/22 Age: 77 Weight: 61kg

## 2022-09-22 DIAGNOSIS — E538 Deficiency of other specified B group vitamins: Secondary | ICD-10-CM | POA: Diagnosis not present

## 2022-09-22 DIAGNOSIS — I480 Paroxysmal atrial fibrillation: Secondary | ICD-10-CM | POA: Diagnosis not present

## 2022-09-22 DIAGNOSIS — Z0000 Encounter for general adult medical examination without abnormal findings: Secondary | ICD-10-CM | POA: Diagnosis not present

## 2022-09-22 DIAGNOSIS — F419 Anxiety disorder, unspecified: Secondary | ICD-10-CM | POA: Diagnosis not present

## 2022-09-22 DIAGNOSIS — G479 Sleep disorder, unspecified: Secondary | ICD-10-CM | POA: Diagnosis not present

## 2022-09-22 DIAGNOSIS — D6869 Other thrombophilia: Secondary | ICD-10-CM | POA: Diagnosis not present

## 2022-09-22 DIAGNOSIS — Z780 Asymptomatic menopausal state: Secondary | ICD-10-CM | POA: Diagnosis not present

## 2022-09-22 DIAGNOSIS — Z79899 Other long term (current) drug therapy: Secondary | ICD-10-CM | POA: Diagnosis not present

## 2022-09-22 DIAGNOSIS — E782 Mixed hyperlipidemia: Secondary | ICD-10-CM | POA: Diagnosis not present

## 2022-09-25 ENCOUNTER — Other Ambulatory Visit: Payer: Self-pay | Admitting: Family Medicine

## 2022-09-25 DIAGNOSIS — Z780 Asymptomatic menopausal state: Secondary | ICD-10-CM

## 2022-09-25 DIAGNOSIS — E538 Deficiency of other specified B group vitamins: Secondary | ICD-10-CM | POA: Diagnosis not present

## 2022-10-09 DIAGNOSIS — E538 Deficiency of other specified B group vitamins: Secondary | ICD-10-CM | POA: Diagnosis not present

## 2022-10-12 ENCOUNTER — Other Ambulatory Visit: Payer: Self-pay | Admitting: Family Medicine

## 2022-10-12 DIAGNOSIS — Z1231 Encounter for screening mammogram for malignant neoplasm of breast: Secondary | ICD-10-CM

## 2022-10-15 ENCOUNTER — Ambulatory Visit (INDEPENDENT_AMBULATORY_CARE_PROVIDER_SITE_OTHER): Payer: PPO | Admitting: Sports Medicine

## 2022-10-15 ENCOUNTER — Other Ambulatory Visit: Payer: Self-pay | Admitting: Sports Medicine

## 2022-10-15 VITALS — BP 122/72 | Ht 65.0 in | Wt 134.0 lb

## 2022-10-15 DIAGNOSIS — M25551 Pain in right hip: Secondary | ICD-10-CM | POA: Diagnosis not present

## 2022-10-15 MED ORDER — ACETAMINOPHEN-CODEINE 300-30 MG PO TABS: ORAL_TABLET | ORAL | 3 refills | Status: DC

## 2022-10-15 NOTE — Patient Instructions (Addendum)
You pulled your iliopsoas muscle, a hip flexor.   Increase cycling slowly and avoid hills: 30 minutes a few times for a week 45 minutes a few times a week 1 hr a few times a week

## 2022-10-15 NOTE — Progress Notes (Signed)
PCP: [name], MD  Subjective:   HPI: Patient is a 77 y.o. female here for right groin pain. Started a couple weeks ago after she had a "mishap", intermittent sharp knife-like groin pain, was worse with any movement. Is much better now, only a little pain when abducting R leg. Initially the pain was worse when standing up while cycling, she stopped cycling and would like to be cleared to start again.   Takes Tylenol #3 at night, in setting of pain  and not being able to cycle like normal. Is requesting refill.  Note patient had injury that was embarrassing and won't elaborate on what it was -  BP 122/72   Ht '5\' 5"'$  (1.651 m)   Wt 134 lb (60.8 kg)   BMI 22.30 kg/m        Objective:  Physical Exam:  Gen: awake, alert, NAD, comfortable in exam room Pulm: breathing unlabored Hip: Right - Inspection: No gross deformity, no swelling - Palpation: No TTP - ROM: Normal range of motion on Flexion, extension, abduction, internal and external rotation - Strength: 4/5 strength with right hip flexion due to pain, otherwise normal strength.  Pain also noted pain with resisted hip abduction - Neuro/vasc: NV intact distally - Special Tests: Negative FABER     Assessment & Plan:  1. Iliopsoas muscle strain  Location and pattern of pain along with physical exam are consistent with iliopsoas strain.  Pt advised she can start cycling again, starting with 30 minutes a few times a week and building up to an hour. She was also provided with home exercises to rehab iliopsoas muscle.    Precious Gilding, DO Family Medicine Resident, PGY-2  I observed and examined the patient with the resident and agree with assessment and plan.  Note reviewed and modified by me. Ila Mcgill, MD

## 2022-10-15 NOTE — Assessment & Plan Note (Signed)
Given HEP  OK to use 1 Tylenol # 3 at night so she can sleep Tylenol in day\ (Note she strongly believe in Tyl 3)  Reck if not resolved in 4 weekw

## 2022-10-22 DIAGNOSIS — E538 Deficiency of other specified B group vitamins: Secondary | ICD-10-CM | POA: Diagnosis not present

## 2022-11-06 DIAGNOSIS — E538 Deficiency of other specified B group vitamins: Secondary | ICD-10-CM | POA: Diagnosis not present

## 2022-11-24 ENCOUNTER — Inpatient Hospital Stay: Admission: RE | Admit: 2022-11-24 | Payer: PPO | Source: Ambulatory Visit

## 2022-11-27 DIAGNOSIS — E538 Deficiency of other specified B group vitamins: Secondary | ICD-10-CM | POA: Diagnosis not present

## 2022-12-02 ENCOUNTER — Telehealth: Payer: Self-pay | Admitting: Neurology

## 2022-12-02 NOTE — Telephone Encounter (Signed)
Patient states she went into a [employer] store to shop. While there she saw an employee that she could tell was poor and didn't think much of herself in looks. She states that God told her to give the lady money. She proceeded to approach the employee of [employer] and and tell her she was pretty while stuffing money in her pocket. The employee got management involved and patient Carly Rhodes was escorted to the mgrs office where she was questioned and then prohibited from returning to any [place] store. The mgmt stated to her that she had scared the employee. The patient has obtained legal counsel and is fighting the decision for her to shop at [place]. She states she needs the letter by beginning of next week as she is seeking a public apology from [employer] and locally. She states she is under major stress and can not sleep from this situation and considered she is elderly and has had encephalitis she is not doing well.

## 2022-12-02 NOTE — Telephone Encounter (Signed)
Patient is needing a doctors letter stating that stress and strain, what it does to her brain. She states that she is worried about it and can't sleep. Having issues thinking thoroughly. Dr. Vickey Huger stated that she was doing better but still have issues. States lawyer states to come get a letter for impairment to send to [place].  Has some legal issues regarding a situation from going into a [employer] store with an employee. She has been prohibited from all  [employer] and she is fighting it. She states she needs by next week.

## 2022-12-03 NOTE — Telephone Encounter (Addendum)
Called pt LVM informing her of message nurse [facility] sent.

## 2022-12-03 NOTE — Telephone Encounter (Signed)
Spoke with Dr Vickey Huger and she reviewed the concern and request of the patient. She feels this is not related to cognitive or insomnia and should be addressed as more behavioral. Pt should follow up with PCP or see about pcp referring to geriatric psych specialist or neurocognitive testing for more input.

## 2022-12-10 ENCOUNTER — Other Ambulatory Visit: Payer: Self-pay | Admitting: Internal Medicine

## 2022-12-10 DIAGNOSIS — I482 Chronic atrial fibrillation, unspecified: Secondary | ICD-10-CM

## 2022-12-10 NOTE — Telephone Encounter (Signed)
Eliquis  refill request received. Patient is 77 years old, weight-60.8kg, Crea-0.77 on 10/12/22 via [place] from [place], Diagnosis-Afib, and last seen by Dr. Ladona Ridgel on 12/05/21-NEEDS AN APPT. Dose is appropriate based on dosing criteria.   Pt needs an appt with Dr. Ladona Ridgel. Will send a message to Scheduler.

## 2022-12-11 DIAGNOSIS — E538 Deficiency of other specified B group vitamins: Secondary | ICD-10-CM | POA: Diagnosis not present

## 2022-12-11 NOTE — Telephone Encounter (Addendum)
Spoke with pt and she is aware she needs an appt with Dr. Ladona Ridgel. She was transferred to Schedulers.  77yrs old, wt-60.8kg, 0.77 on 10/12/22 from [place] via [place], last seen by Dr. Ladona Ridgel on 12/05/21 and now has an appt pending with Mardelle Matte on 12/15/22 at 12n; will send in refill request at this time.

## 2022-12-15 ENCOUNTER — Ambulatory Visit: Payer: PPO | Admitting: Student

## 2022-12-18 ENCOUNTER — Ambulatory Visit
Admission: RE | Admit: 2022-12-18 | Discharge: 2022-12-18 | Disposition: A | Discharge status: Home / Self Care | Payer: PPO | Source: Ambulatory Visit | Attending: Family Medicine | Admitting: Family Medicine

## 2022-12-18 DIAGNOSIS — Z1231 Encounter for screening mammogram for malignant neoplasm of breast: Secondary | ICD-10-CM

## 2022-12-25 DIAGNOSIS — E538 Deficiency of other specified B group vitamins: Secondary | ICD-10-CM | POA: Diagnosis not present

## 2023-01-05 NOTE — Progress Notes (Deleted)
Cardiology Office Note Date:  12/06/2020  Patient ID:  Carly, Rhodes 1946/05/12, MRN [id number] PCP:  Aliene Beams, MD  EP: Dr. Ladona Ridgel    Chief Complaint:  *** Annual visit  History of Present Illness: Carly Rhodes is a 77 y.o. 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 Ladona Ridgel 01/03/19 via tele health, doing well, no changes were made. Older notes report  " seen in the hospital when she had presented with AMS in the setting of an acute febrile illness, was found to have atrial fib with a RVR, placed initially on beta blockers where she had a long pause and switched to low dose amiodarone"  Dec 2020 hospitalized had lap-chole   I saw her June 2021 Cardiac wise, sounds like she is doing OK.  She is a little hard to keep on topic.  Mentions that since her encephalitis a couple years back she has never felt the same, like she always has a bit of "brain fog". No CP, palpitations or cardiac awareness. Denies SOB, DOE No near syncope or syncope No bleeding or signs of bleeding Her husband passed away 27-Oct-2020and this has been very difficult.  She says he was the love of her life and does not think she will ever be the same. Discussed perhaps grief counseling of she feels like she need.  She did not seem to feel like that was necessary. No changes were made, planned for 40mo visit.  I saw her 12/06/20 She is doing very well, riding her bike regularly on a 12 mile route that she likes to do. No CP, palpitations No SOB No near syncope or syncope No bleeding or signs of bleeding She sees her PMD regularly avery few months, has labs done there as well. No changes were made  She saw Dr. Ladona Ridgel 12/05/21, she stopped amiodarone 2/2 hair loss that despite stopping it, did not grow back, she also had stopped her Eliquis (unclear why). 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   Osteoporosis    Seasonal allergies     Past Surgical History:  Procedure Laterality Date   ABDOMINAL HYSTERECTOMY  1984   APPENDECTOMY  1984   at time of Hysterectomy   BILIARY DILATION  07/21/2019   Procedure: BILIARY DILATION;  Surgeon: Lemar Lofty., MD;  Location: [facility] ENDOSCOPY;  Service: Gastroenterology;;   CHOLECYSTECTOMY N/A 07/22/2019   Procedure: LAPAROSCOPIC CHOLECYSTECTOMY;  Surgeon: Berna Bue, MD;  Location: [facility] OR;  Service: General;  Laterality: N/A;   ERCP N/A 07/21/2019   Procedure: ENDOSCOPIC RETROGRADE CHOLANGIOPANCREATOGRAPHY (ERCP);  Surgeon: Lemar Lofty., MD;  Location: [facility] ENDOSCOPY;  Service: Gastroenterology;  Laterality: N/A;   ERCP     HAMMER TOE SURGERY  1993   2nd toe left foot   PANCREATIC STENT PLACEMENT  07/21/2019   Procedure: PANCREATIC STENT PLACEMENT;  Surgeon: Meridee Score Netty Starring., MD;  Location: [facility] ENDOSCOPY;  Service: Gastroenterology;;   REMOVAL OF STONES  07/21/2019   Procedure: REMOVAL OF STONES;  Surgeon: Lemar Lofty., MD;  Location: [facility] ENDOSCOPY;  Service: Gastroenterology;;   Dennison Mascot  07/21/2019   Procedure: Dennison Mascot;  Surgeon: Mansouraty, Netty Starring., MD;  Location: [facility] ENDOSCOPY;  Service: Gastroenterology;;   THYROID LOBECTOMY  09/15/2012   Procedure: THYROID LOBECTOMY;  Surgeon: Suzanna Obey, MD;  Location: [facility] OR;  Service: ENT;  Laterality: Left;   THYROIDECTOMY, PARTIAL  09/15/2012   Dr  Jearld Fenton   TONSILLECTOMY  1957    Current Outpatient Medications  Medication Sig Dispense Refill   acetaminophen (TYLENOL) 500 MG tablet Take 2 tablets (1,000 mg total) by mouth every 6 (six) hours as needed for moderate pain.     acetaminophen-codeine (TYLENOL #3) 300-30 MG tablet Take one tab at bedtime as needed 30 tablet 3   acyclovir (ZOVIRAX) 400 MG tablet Take 1 tablet (400 mg total) by mouth every 6 (six) hours as needed. 30 tablet 2   ALPRAZolam (XANAX) 0.5 MG tablet TAKE 1/2 (ONE-HALF) TO 1 TABLET BY MOUTH THREE TIMES DAILY AS NEEDED 60 tablet 0   apixaban (ELIQUIS) 5 MG TABS tablet Take 1 tablet (5 mg total) by mouth 2 (two) times daily. 60 tablet 5   Ascorbic Acid (VITAMIN C PO) Take 1 tablet by mouth daily.     B-D TB SYRINGE 1CC/25GX5/8" 25G X 5/8" 1 ML MISC USE FOR B12 INJECTIONS     baclofen (LIORESAL) 10 MG tablet TAKE 2 TABLETS BY MOUTH AT BEDTIME 60 tablet 0   calcium carbonate (OS-CAL - DOSED IN MG OF ELEMENTAL CALCIUM) 1250 (500 Ca) MG tablet Take 1 tablet by mouth.     cyanocobalamin (,VITAMIN B-12,) 1000 MCG/ML injection INJECT 1 ML (CC) INTRAMUSCULARLY ONCE EVERY 30 DAYS 3 mL 0   escitalopram (LEXAPRO) 5 MG tablet 1 tablet     ezetimibe (ZETIA) 10 MG tablet Take 1 tablet by mouth once daily 90 tablet 0   Multiple Vitamin (MULTIVITAMIN WITH MINERALS) TABS tablet Take 1 tablet by mouth daily. Centrum     NEEDLE, DISP, 25 G (BD ECLIPSE NEEDLE) 25G X 5/8" MISC Use for B12 injections 100 each 3   nystatin ointment (MYCOSTATIN) SMARTSIG:1 Topical Every Night     traZODone (DESYREL) 100 MG tablet TAKE 1 TABLET BY MOUTH AT BEDTIME 30 tablet 0   No current facility-administered medications for this visit.    Allergies:   Amiodarone and Procaine   Social History:  The patient  reports that she has never smoked. She has never used smokeless tobacco. She reports that she does not drink alcohol and does not use drugs.   Family History:  The patient's family history includes  Colon cancer (age of onset: 20) in her paternal grandmother; Diabetes in her father; Hypertension in her sister; Stomach cancer (age of onset: 79) in her brother.  ROS:  Please see the history of present illness.  All other systems are reviewed and otherwise negative.   PHYSICAL EXAM:  VS:  There were no vitals taken for this visit. BMI: There is no height or weight on file to calculate BMI. Well nourished, well developed, in no acute distress  HEENT: normocephalic, atraumatic  Neck: no JVD, carotid bruits or masses Cardiac: *** RRR; no significant murmurs, no rubs, or gallops Lungs: *** CTA b/l no wheezing, rhonchi or rales  Abd: soft, nontender MS: no deformity, age appropriate atrophy Ext: ***  no edema  Skin: warm and dry, no rash Neuro:  No gross deficits appreciated Psych: euthymic mood, full affect    EKG:  Done today and reviewed by myself ***   01/11/2018: TTE Study Conclusions  - Left ventricle: The cavity size was normal. Systolic function was    normal. The estimated ejection fraction was in the range of 55%    to 60%. Wall motion was normal; there were no regional wall    motion abnormalities.  - Aortic valve: Transvalvular velocity was within the normal range.    There was no stenosis. There was no regurgitation. Valve area    (VTI): 1.97 cm^2. Valve area (Vmax): 2.23 cm^2. Valve area    (Vmean): 2.19 cm^2.  - Mitral valve: Transvalvular velocity was within the normal range.    There was no evidence for stenosis. There was mild regurgitation.    Valve area by pressure half-time: 1.26 cm^2.  - Left atrium: The atrium was severely dilated.  - Right ventricle: The cavity size was normal. Wall thickness was    normal. Systolic function was normal.  - Tricuspid valve: There was trivial regurgitation.  - Pulmonary arteries: Systolic pressure was within the normal    range. PA peak pressure: 21 mm Hg (S).     Recent Labs: No results found for requested labs within  last 8760 hours.  No results found for requested labs within last 8760 hours.   CrCl cannot be calculated (Patient's most recent lab result is older than the maximum 21 days allowed.).   Wt Readings from Last 3 Encounters:  11/21/20 148 lb (67.1 kg)  06/19/20 148 lb (67.1 kg)  06/06/20 144 lb 3.2 oz (65.4 kg)     Other studies reviewed: Additional studies/records reviewed today include: summarized above  ASSESSMENT AND PLAN:  1. Paroxysmal Afib     CHA2DS2Vasc is *** 3, on Eliquis, *** appropriately dosed     *** burden by symptoms  2. Secondary hypercoagulable state   Disposition: ***    Current medicines are reviewed at length with the patient today.  The patient did not have any concerns regarding medicines.  Carly Fredrickson, PA-C 12/06/2020 5:20 AM     [facility] [address] [place] [address] [phone] (office)  [phone] (fax)

## 2023-01-07 ENCOUNTER — Ambulatory Visit: Payer: PPO | Attending: Student | Admitting: Physician Assistant

## 2023-01-08 ENCOUNTER — Encounter: Payer: Self-pay | Admitting: Physician Assistant

## 2023-01-08 DIAGNOSIS — E538 Deficiency of other specified B group vitamins: Secondary | ICD-10-CM | POA: Diagnosis not present

## 2023-01-29 DIAGNOSIS — E538 Deficiency of other specified B group vitamins: Secondary | ICD-10-CM | POA: Diagnosis not present

## 2023-02-12 DIAGNOSIS — E538 Deficiency of other specified B group vitamins: Secondary | ICD-10-CM | POA: Diagnosis not present

## 2023-02-26 DIAGNOSIS — E538 Deficiency of other specified B group vitamins: Secondary | ICD-10-CM | POA: Diagnosis not present

## 2023-03-11 ENCOUNTER — Inpatient Hospital Stay: Admission: RE | Admit: 2023-03-11 | Payer: PPO | Source: Ambulatory Visit

## 2023-03-12 ENCOUNTER — Other Ambulatory Visit: Payer: Self-pay | Admitting: Internal Medicine

## 2023-03-12 DIAGNOSIS — I482 Chronic atrial fibrillation, unspecified: Secondary | ICD-10-CM

## 2023-03-12 DIAGNOSIS — E538 Deficiency of other specified B group vitamins: Secondary | ICD-10-CM | POA: Diagnosis not present

## 2023-03-12 NOTE — Telephone Encounter (Signed)
Prescription refill request for Eliquis received. Indication:afib Last office visit:upcoming Scr:0.7  2/24 Age: 77 Weight:60.8  kg  Prescription refilled

## 2023-03-14 NOTE — Progress Notes (Deleted)
Cardiology Office Note Date:  03/14/2023  Patient ID:  Carly, Rhodes 12/13/1945, MRN [id number] PCP:  Aliene Beams, MD  EP: Dr. Ladona Ridgel    Chief Complaint:  *** Annual visit  History of Present Illness: Carly Rhodes is a 77 y.o. 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 Ladona Ridgel 01/03/19 via tele health, doing well, no changes were made. Older notes report  " seen in the hospital when she had presented with AMS in the setting of an acute febrile illness, was found to have atrial fib with a RVR, placed initially on beta blockers where she had a long pause and switched to low dose amiodarone"  Dec 2020 hospitalized had lap-chole   I saw her June 2021 Cardiac wise, sounds like she is doing OK.  She is a little hard to keep on topic.  Mentions that since her encephalitis a couple years back she has never felt the same, like she always has a bit of "brain fog". No CP, palpitations or cardiac awareness. Denies SOB, DOE No near syncope or syncope No bleeding or signs of bleeding  Her husband passed away 19-Oct-2020and this has been very difficult.  She says he was the love of her life and does not think she will ever be the same. Discussed perhaps grief counseling of she feels like she need.  She did not seem to feel like that was necessary.  No changes were made, planned for 61mo visit.  I saw her 12/06/20 She is doing very well, riding her bike regularly on a 12 mile route that she likes to do. No changes were made  She saw Dr. Ladona Ridgel 12/05/21, she had self stopped the Eliquis, no symptoms.  He mentions sinus node dysfunction without symptoms, advised she resume OAC, she was agreeable.  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   Osteoporosis    Seasonal allergies     Past Surgical History:  Procedure Laterality Date   ABDOMINAL HYSTERECTOMY  1984   APPENDECTOMY  1984   at time of Hysterectomy   BILIARY DILATION  07/21/2019   Procedure: BILIARY DILATION;  Surgeon: Lemar Lofty., MD;  Location: [facility] ENDOSCOPY;  Service: Gastroenterology;;   CHOLECYSTECTOMY N/A 07/22/2019   Procedure: LAPAROSCOPIC CHOLECYSTECTOMY;  Surgeon: Berna Bue, MD;  Location: [facility] OR;  Service: General;  Laterality: N/A;   ERCP N/A 07/21/2019   Procedure: ENDOSCOPIC RETROGRADE CHOLANGIOPANCREATOGRAPHY (ERCP);  Surgeon: Lemar Lofty., MD;  Location: [name] ENDOSCOPY;  Service: Gastroenterology;  Laterality: N/A;   ERCP     HAMMER TOE SURGERY  1993   2nd toe left foot   PANCREATIC STENT PLACEMENT  07/21/2019   Procedure: PANCREATIC STENT PLACEMENT;  Surgeon: Lemar Lofty., MD;  Location: [facility] ENDOSCOPY;  Service: Gastroenterology;;   REMOVAL OF STONES  07/21/2019   Procedure: REMOVAL OF STONES;  Surgeon: Lemar Lofty., MD;  Location: [facility] ENDOSCOPY;  Service: Gastroenterology;;   Dennison Mascot  07/21/2019   Procedure: Dennison Mascot;  Surgeon: Lemar Lofty., MD;  Location: [facility] ENDOSCOPY;  Service: Gastroenterology;;   THYROID LOBECTOMY  09/15/2012   Procedure: THYROID LOBECTOMY;  Surgeon: Suzanna Obey, MD;  Location: [facility] OR;  Service: ENT;  Laterality: Left;   THYROIDECTOMY, PARTIAL  09/15/2012   Dr Jearld Fenton   TONSILLECTOMY  1957    Current Outpatient Medications  Medication Sig Dispense Refill   acetaminophen-codeine (TYLENOL/CODEINE #3) 300-30 MG tablet One qhs 30 tablet 3   acyclovir (ZOVIRAX) 400 MG tablet Take 1 tablet (400 mg total) by mouth every 6 (six) hours as needed. 30 tablet 2   ALPRAZolam (XANAX) 0.5 MG tablet TAKE 1/2  (ONE-HALF) TO 1 TABLET BY MOUTH THREE TIMES DAILY AS NEEDED 60 tablet 0   apixaban (ELIQUIS) 5 MG TABS tablet Take 1 tablet by mouth twice daily 180 tablet 1   Ascorbic Acid (VITAMIN C PO) Take 1 tablet by mouth daily.     B-D TB SYRINGE 1CC/25GX5/8" 25G X 5/8" 1 ML MISC USE FOR B12 INJECTIONS     baclofen (LIORESAL) 10 MG tablet TAKE 2 TABLETS BY MOUTH AT BEDTIME 60 tablet 0   calcium carbonate (OS-CAL - DOSED IN MG OF ELEMENTAL CALCIUM) 1250 (500 Ca) MG tablet Take 1 tablet by mouth.     cyanocobalamin (,VITAMIN B-12,) 1000 MCG/ML injection INJECT 1 ML (CC) INTRAMUSCULARLY ONCE EVERY 30 DAYS 3 mL 0   escitalopram (LEXAPRO) 5 MG tablet 1 tablet     ezetimibe (ZETIA) 10 MG tablet Take 1 tablet by mouth once daily 90 tablet 0   Multiple Vitamin (MULTIVITAMIN WITH MINERALS) TABS tablet Take 1 tablet by mouth daily. Centrum     NEEDLE, DISP, 25 G (BD ECLIPSE NEEDLE) 25G X 5/8" MISC Use for B12 injections 100 each 3   Suvorexant (BELSOMRA) 15 MG TABS Take 15 mg by mouth at bedtime as needed (for insomnia, may alternate with xanax but do NOT take along with xanax). Insomnia prn, can not use with xanax. 30 tablet 0   traZODone (DESYREL) 100 MG tablet Take 1.5 tablets (150 mg total) by mouth at bedtime. 45 tablet 0   No current facility-administered medications for this visit.    Allergies:   Amiodarone and Procaine   Social History:  The patient  reports that she has never smoked. She has never used smokeless tobacco. She reports that she does not drink alcohol and does not use drugs.   Family History:  The patient's family history includes Colon cancer (age of onset: 65) in her paternal grandmother; Diabetes in her father; Hypertension in her sister; Stomach cancer (age of onset: 68) in her brother.  ROS:  Please see the history of present illness.  All other systems are reviewed and otherwise negative.   PHYSICAL EXAM:  VS:  There were no vitals taken for this visit. BMI: There is no height or  weight on file to calculate BMI. Well nourished, well developed, in no acute distress  HEENT: normocephalic, atraumatic  Neck: no JVD, carotid bruits or masses Cardiac: *** RRR; no significant murmurs, no rubs, or gallops Lungs: *** CTA b/l no wheezing, rhonchi or rales  Abd: soft, nontender MS: no deformity, age appropriate atrophy Ext: *** no edema  Skin: warm and dry, no rash Neuro:  No gross deficits appreciated Psych: euthymic mood, full affect    EKG:  Done today and reviewed by myself ***   01/11/2018: TTE Study Conclusions  - Left ventricle: The cavity size was normal. Systolic function was    normal. The estimated ejection fraction was in the range  of 55%    to 60%. Wall motion was normal; there were no regional wall    motion abnormalities.  - Aortic valve: Transvalvular velocity was within the normal range.    There was no stenosis. There was no regurgitation. Valve area    (VTI): 1.97 cm^2. Valve area (Vmax): 2.23 cm^2. Valve area    (Vmean): 2.19 cm^2.  - Mitral valve: Transvalvular velocity was within the normal range.    There was no evidence for stenosis. There was mild regurgitation.    Valve area by pressure half-time: 1.26 cm^2.  - Left atrium: The atrium was severely dilated.  - Right ventricle: The cavity size was normal. Wall thickness was    normal. Systolic function was normal.  - Tricuspid valve: There was trivial regurgitation.  - Pulmonary arteries: Systolic pressure was within the normal    range. PA peak pressure: 21 mm Hg (S).     Recent Labs: No results found for requested labs within last 365 days.  No results found for requested labs within last 365 days.   CrCl cannot be calculated (Patient's most recent lab result is older than the maximum 21 days allowed.).   Wt Readings from Last 3 Encounters:  10/15/22 134 lb (60.8 kg)  05/27/22 135 lb (61.2 kg)  12/08/21 136 lb 8 oz (61.9 kg)     Other studies reviewed: Additional  studies/records reviewed today include: summarized above  ASSESSMENT AND PLAN:  1. Paroxysmal Afib     CHA2DS2Vasc is 3, on Eliquis, *** appropriately dosed     *** Low burden based on lack of symptoms      Disposition: ***    Current medicines are reviewed at length with the patient today.  The patient did not have any concerns regarding medicines.  Norma Fredrickson, PA-C 03/14/2023 11:05 AM     [facility] [address] [place] [address] [phone] (office)  [phone] (fax)

## 2023-03-16 ENCOUNTER — Encounter: Payer: Self-pay | Admitting: Physician Assistant

## 2023-03-16 ENCOUNTER — Ambulatory Visit: Payer: PPO | Attending: Student | Admitting: Physician Assistant

## 2023-03-26 DIAGNOSIS — E538 Deficiency of other specified B group vitamins: Secondary | ICD-10-CM | POA: Diagnosis not present

## 2023-04-09 DIAGNOSIS — E538 Deficiency of other specified B group vitamins: Secondary | ICD-10-CM | POA: Diagnosis not present

## 2023-04-23 DIAGNOSIS — E538 Deficiency of other specified B group vitamins: Secondary | ICD-10-CM | POA: Diagnosis not present

## 2023-05-07 DIAGNOSIS — E538 Deficiency of other specified B group vitamins: Secondary | ICD-10-CM | POA: Diagnosis not present

## 2023-05-17 DIAGNOSIS — I482 Chronic atrial fibrillation, unspecified: Secondary | ICD-10-CM | POA: Diagnosis not present

## 2023-05-17 DIAGNOSIS — L299 Pruritus, unspecified: Secondary | ICD-10-CM | POA: Diagnosis not present

## 2023-05-17 DIAGNOSIS — L678 Other hair color and hair shaft abnormalities: Secondary | ICD-10-CM | POA: Diagnosis not present

## 2023-05-21 DIAGNOSIS — E538 Deficiency of other specified B group vitamins: Secondary | ICD-10-CM | POA: Diagnosis not present

## 2023-05-24 NOTE — Progress Notes (Unsigned)
Cardiology Office Note Date:  05/24/2023  Patient ID:  Carly, Rhodes [birth year], MRN [id number] PCP:  Aliene Beams, MD  EP: Dr. Ladona Ridgel    Chief Complaint:   *** Annual visit  History of Present Illness: Carly Rhodes is a 77 y.o. 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 Ladona Ridgel,  01/03/19 via tele health, doing well, no changes were made. Older notes report  " seen in the hospital when she had presented with AMS in the setting of an acute febrile illness, was found to have atrial fib with a RVR, placed initially on beta blockers where she had a long pause and switched to low dose amiodarone"   I saw her June 2021 Cardiac wise, sounds like she is doing OK.  She is a little hard to keep on topic.  Mentions that since her encephalitis a couple years back she has never felt the same, like she always has a bit of "brain fog". No CP, palpitations or cardiac awareness. Denies SOB, DOE No near syncope or syncope No bleeding or signs of bleeding  Her husband passed away 11-06-20and this has been very difficult.  She says he was the love of her life and does not think she will ever be the same. Discussed perhaps grief counseling of she feels like she need.  She did not seem to feel like that was necessary.  No changes were made, planned for 29mo visit.  I saw her 12/06/20 She is doing very well, riding her bike regularly on a 12 mile route that she likes to do. No CP, palpitations No SOB No near syncope or syncope No bleeding or signs of bleeding She sees her PMD regularly avery few months, has labs done there as well. No changes made  She saw Dr. Ladona Ridgel 12/05/21, off amiodarone 2/2 hair loss, though had not grown back.  She stopped Eliquis  Advised she resume OAC, she was agreeable  *** symptoms, brady?  Pauses? *** bleeding, eliquis, dose, labs ***  AFib?  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   Osteoporosis    Seasonal allergies     Past Surgical History:  Procedure Laterality Date   ABDOMINAL HYSTERECTOMY  1984   APPENDECTOMY  1984   at time of Hysterectomy   BILIARY DILATION  07/21/2019   Procedure: BILIARY DILATION;  Surgeon: Lemar Lofty., MD;  Location: [facility] ENDOSCOPY;  Service: Gastroenterology;;   CHOLECYSTECTOMY N/A 07/22/2019   Procedure: LAPAROSCOPIC CHOLECYSTECTOMY;  Surgeon: Berna Bue, MD;  Location: [facility] OR;  Service: General;  Laterality: N/A;   ERCP N/A 07/21/2019   Procedure: ENDOSCOPIC RETROGRADE CHOLANGIOPANCREATOGRAPHY (ERCP);  Surgeon: Lemar Lofty., MD;  Location: [facility] ENDOSCOPY;  Service: Gastroenterology;  Laterality: N/A;   ERCP     HAMMER TOE SURGERY  1993   2nd toe left foot   PANCREATIC STENT PLACEMENT  07/21/2019   Procedure: PANCREATIC STENT PLACEMENT;  Surgeon: Lemar Lofty., MD;  Location: [facility] ENDOSCOPY;  Service: Gastroenterology;;   REMOVAL OF STONES  07/21/2019   Procedure: REMOVAL OF STONES;  Surgeon: Corliss Parish  Montez Hageman., MD;  Location: [facility] ENDOSCOPY;  Service: Gastroenterology;;   Dennison Mascot  07/21/2019   Procedure: Dennison Mascot;  Surgeon: Mansouraty, Netty Starring., MD;  Location: [facility] ENDOSCOPY;  Service: Gastroenterology;;   THYROID LOBECTOMY  09/15/2012   Procedure: THYROID LOBECTOMY;  Surgeon: Suzanna Obey, MD;  Location: [facility] OR;  Service: ENT;  Laterality: Left;   THYROIDECTOMY, PARTIAL  09/15/2012   Dr Jearld Fenton   TONSILLECTOMY  1957    Current Outpatient Medications  Medication Sig Dispense Refill   acetaminophen-codeine (TYLENOL/CODEINE #3) 300-30 MG tablet One qhs 30 tablet 3   acyclovir (ZOVIRAX) 400 MG  tablet Take 1 tablet (400 mg total) by mouth every 6 (six) hours as needed. 30 tablet 2   ALPRAZolam (XANAX) 0.5 MG tablet TAKE 1/2 (ONE-HALF) TO 1 TABLET BY MOUTH THREE TIMES DAILY AS NEEDED 60 tablet 0   apixaban (ELIQUIS) 5 MG TABS tablet Take 1 tablet by mouth twice daily 180 tablet 1   Ascorbic Acid (VITAMIN C PO) Take 1 tablet by mouth daily.     B-D TB SYRINGE 1CC/25GX5/8" 25G X 5/8" 1 ML MISC USE FOR B12 INJECTIONS     baclofen (LIORESAL) 10 MG tablet TAKE 2 TABLETS BY MOUTH AT BEDTIME 60 tablet 0   calcium carbonate (OS-CAL - DOSED IN MG OF ELEMENTAL CALCIUM) 1250 (500 Ca) MG tablet Take 1 tablet by mouth.     cyanocobalamin (,VITAMIN B-12,) 1000 MCG/ML injection INJECT 1 ML (CC) INTRAMUSCULARLY ONCE EVERY 30 DAYS 3 mL 0   escitalopram (LEXAPRO) 5 MG tablet 1 tablet     ezetimibe (ZETIA) 10 MG tablet Take 1 tablet by mouth once daily 90 tablet 0   Multiple Vitamin (MULTIVITAMIN WITH MINERALS) TABS tablet Take 1 tablet by mouth daily. Centrum     NEEDLE, DISP, 25 G (BD ECLIPSE NEEDLE) 25G X 5/8" MISC Use for B12 injections 100 each 3   Suvorexant (BELSOMRA) 15 MG TABS Take 15 mg by mouth at bedtime as needed (for insomnia, may alternate with xanax but do NOT take along with xanax). Insomnia prn, can not use with xanax. 30 tablet 0   traZODone (DESYREL) 100 MG tablet Take 1.5 tablets (150 mg total) by mouth at bedtime. 45 tablet 0   No current facility-administered medications for this visit.    Allergies:   Amiodarone and Procaine   Social History:  The patient  reports that she has never smoked. She has never used smokeless tobacco. She reports that she does not drink alcohol and does not use drugs.   Family History:  The patient's family history includes Colon cancer (age of onset: 37) in her paternal grandmother; Diabetes in her father; Hypertension in her sister; Stomach cancer (age of onset: 27) in her brother.  ROS:  Please see the history of present illness.  All other systems  are reviewed and otherwise negative.   PHYSICAL EXAM:  VS:  There were no vitals taken for this visit. BMI: There is no height or weight on file to calculate BMI. Well nourished, well developed, in no acute distress  HEENT: normocephalic, atraumatic  Neck: no JVD, carotid bruits or masses Cardiac: *** RRR; no significant murmurs, no rubs, or gallops Lungs: *** CTA b/l no wheezing, rhonchi or rales  Abd: soft, nontender MS: no deformity, age appropriate atrophy Ext: *** no edema  Skin: warm and dry, no rash Neuro:  No gross deficits appreciated Psych: euthymic mood, full affect    EKG:  Done today and reviewed by myself ***  01/11/2018: TTE Study Conclusions  - Left ventricle: The cavity size was normal. Systolic function was    normal. The estimated ejection fraction was in the range of 55%    to 60%. Wall motion was normal; there were no regional wall    motion abnormalities.  - Aortic valve: Transvalvular velocity was within the normal range.    There was no stenosis. There was no regurgitation. Valve area    (VTI): 1.97 cm^2. Valve area (Vmax): 2.23 cm^2. Valve area    (Vmean): 2.19 cm^2.  - Mitral valve: Transvalvular velocity was within the normal range.    There was no evidence for stenosis. There was mild regurgitation.    Valve area by pressure half-time: 1.26 cm^2.  - Left atrium: The atrium was severely dilated.  - Right ventricle: The cavity size was normal. Wall thickness was    normal. Systolic function was normal.  - Tricuspid valve: There was trivial regurgitation.  - Pulmonary arteries: Systolic pressure was within the normal    range. PA peak pressure: 21 mm Hg (S).     Recent Labs: No results found for requested labs within last 365 days.  No results found for requested labs within last 365 days.   CrCl cannot be calculated (Patient's most recent lab result is older than the maximum 21 days allowed.).   Wt Readings from Last 3 Encounters:  10/15/22  134 lb (60.8 kg)  05/27/22 135 lb (61.2 kg)  12/08/21 136 lb 8 oz (61.9 kg)     Other studies reviewed: Additional studies/records reviewed today include: summarized above  ASSESSMENT AND PLAN:  1. Paroxysmal Afib     CHA2DS2Vasc is 3, on Eliquis, *** appropriately dosed     *** Low burden based on lack of symptoms      Disposition: she sees her PMD regularly, we will see her back in a year, sooner if needed.    Current medicines are reviewed at length with the patient today.  The patient did not have any concerns regarding medicines.  Norma Fredrickson, PA-C 05/24/2023 12:49 PM     [facility] [address] [place] [address] [phone] (office)  [phone] (fax)

## 2023-05-26 ENCOUNTER — Encounter: Payer: Self-pay | Admitting: Physician Assistant

## 2023-05-26 ENCOUNTER — Ambulatory Visit: Payer: PPO | Attending: Student | Admitting: Physician Assistant

## 2023-05-26 VITALS — BP 120/78 | HR 65 | Ht 65.0 in | Wt 138.4 lb

## 2023-05-26 DIAGNOSIS — I480 Paroxysmal atrial fibrillation: Secondary | ICD-10-CM | POA: Diagnosis not present

## 2023-05-26 DIAGNOSIS — D6869 Other thrombophilia: Secondary | ICD-10-CM

## 2023-05-26 NOTE — Patient Instructions (Signed)
Medication Instructions:   Your physician recommends that you continue on your current medications as directed. Please refer to the Current Medication list given to you today.  *If you need a refill on your cardiac medications before your next appointment, please call your pharmacy*   Lab Work:  NONE ORDERED  TODAY    If you have labs (blood work) drawn today and your tests are completely normal, you will receive your results only by: MyChart Message (if you have MyChart) OR A paper copy in the mail If you have any lab test that is abnormal or we need to change your treatment, we will call you to review the results.   Testing/Procedures: NONE ORDERED  TODAY      Follow-Up: At [facility], you and your health needs are our priority.  As part of our continuing mission to provide you with exceptional heart care, we have created designated Provider Care Teams.  These Care Teams include your primary Cardiologist (physician) and Advanced Practice Providers (APPs -  Physician Assistants and Nurse Practitioners) who all work together to provide you with the care you need, when you need it.  We recommend signing up for the patient portal called "MyChart".  Sign up information is provided on this After Visit Summary.  MyChart is used to connect with patients for Virtual Visits (Telemedicine).  Patients are able to view lab/test results, encounter notes, upcoming appointments, etc.  Non-urgent messages can be sent to your provider as well.   To learn more about what you can do with MyChart, go to [email or web address].    Your next appointment:   1 year(s)  Provider:   Gregg Taylor, MD    Other Instructions  

## 2023-06-02 ENCOUNTER — Ambulatory Visit: Payer: PPO | Admitting: Adult Health

## 2023-06-04 DIAGNOSIS — E559 Vitamin D deficiency, unspecified: Secondary | ICD-10-CM | POA: Diagnosis not present

## 2023-06-04 DIAGNOSIS — I480 Paroxysmal atrial fibrillation: Secondary | ICD-10-CM | POA: Diagnosis not present

## 2023-06-04 DIAGNOSIS — G479 Sleep disorder, unspecified: Secondary | ICD-10-CM | POA: Diagnosis not present

## 2023-06-04 DIAGNOSIS — F419 Anxiety disorder, unspecified: Secondary | ICD-10-CM | POA: Diagnosis not present

## 2023-06-04 DIAGNOSIS — E782 Mixed hyperlipidemia: Secondary | ICD-10-CM | POA: Diagnosis not present

## 2023-06-04 DIAGNOSIS — F330 Major depressive disorder, recurrent, mild: Secondary | ICD-10-CM | POA: Diagnosis not present

## 2023-06-04 DIAGNOSIS — E538 Deficiency of other specified B group vitamins: Secondary | ICD-10-CM | POA: Diagnosis not present

## 2023-06-11 DIAGNOSIS — E538 Deficiency of other specified B group vitamins: Secondary | ICD-10-CM | POA: Diagnosis not present

## 2023-06-18 DIAGNOSIS — Z23 Encounter for immunization: Secondary | ICD-10-CM | POA: Diagnosis not present

## 2023-06-18 DIAGNOSIS — E538 Deficiency of other specified B group vitamins: Secondary | ICD-10-CM | POA: Diagnosis not present

## 2023-06-25 DIAGNOSIS — E538 Deficiency of other specified B group vitamins: Secondary | ICD-10-CM | POA: Diagnosis not present

## 2023-07-02 DIAGNOSIS — E538 Deficiency of other specified B group vitamins: Secondary | ICD-10-CM | POA: Diagnosis not present

## 2023-07-09 DIAGNOSIS — E538 Deficiency of other specified B group vitamins: Secondary | ICD-10-CM | POA: Diagnosis not present

## 2023-07-14 DIAGNOSIS — E538 Deficiency of other specified B group vitamins: Secondary | ICD-10-CM | POA: Diagnosis not present

## 2023-07-26 DIAGNOSIS — F331 Major depressive disorder, recurrent, moderate: Secondary | ICD-10-CM | POA: Diagnosis not present

## 2023-07-26 DIAGNOSIS — E538 Deficiency of other specified B group vitamins: Secondary | ICD-10-CM | POA: Diagnosis not present

## 2023-07-26 DIAGNOSIS — I495 Sick sinus syndrome: Secondary | ICD-10-CM | POA: Diagnosis not present

## 2023-07-26 DIAGNOSIS — F419 Anxiety disorder, unspecified: Secondary | ICD-10-CM | POA: Diagnosis not present

## 2023-07-26 DIAGNOSIS — Z23 Encounter for immunization: Secondary | ICD-10-CM | POA: Diagnosis not present

## 2023-07-29 ENCOUNTER — Encounter: Payer: Self-pay | Admitting: Family Medicine

## 2023-07-29 ENCOUNTER — Ambulatory Visit: Payer: PPO | Admitting: Family Medicine

## 2023-07-29 VITALS — BP 124/82 | Ht 65.0 in | Wt 135.0 lb

## 2023-07-29 DIAGNOSIS — G894 Chronic pain syndrome: Secondary | ICD-10-CM | POA: Diagnosis not present

## 2023-07-29 DIAGNOSIS — M25552 Pain in left hip: Secondary | ICD-10-CM | POA: Diagnosis not present

## 2023-07-29 DIAGNOSIS — G8929 Other chronic pain: Secondary | ICD-10-CM

## 2023-07-29 MED ORDER — ACETAMINOPHEN-CODEINE 300-30 MG PO TABS: ORAL_TABLET | ORAL | 1 refills | Status: DC

## 2023-07-29 NOTE — Progress Notes (Signed)
DATE OF VISIT: 07/29/2023        Carly Rhodes DOB: 12/01/1945 MRN: [id number]  CC:  medication refill  History of present Illness: Carly Rhodes is a 77 y.o. female who presents for a follow-up visit for medication refill Patient of Dr Darrick Penna  - last seen 10/15/22 for chronic right hip pain - Using Tylenol #3 as needed for pain, needs refill today - Refill 10/15/2022, 30 tabs, 3 refills; last dispensed 04/07/2023  Patient reports continues to have ongoing chronic hip and back pain She has trouble sleeping on her back and side due to pain Tylenol #3 is helpful - typically taking a 1/2 tab at bedtime - using every night Also taking Xanax 0.5mg  nightly - taking since age 3yo Has not been experiencing any side effects to medications No new injury or trauma  Medications:  Outpatient Encounter Medications as of 07/29/2023  Medication Sig   acetaminophen-codeine (TYLENOL/CODEINE #3) 300-30 MG tablet One qhs   ALPRAZolam (XANAX) 0.5 MG tablet TAKE 1/2 (ONE-HALF) TO 1 TABLET BY MOUTH THREE TIMES DAILY AS NEEDED   apixaban (ELIQUIS) 5 MG TABS tablet Take 1 tablet by mouth twice daily   Ascorbic Acid (VITAMIN C PO) Take 1 tablet by mouth daily.   B-D TB SYRINGE 1CC/25GX5/8" 25G X 5/8" 1 ML MISC USE FOR B12 INJECTIONS   baclofen (LIORESAL) 10 MG tablet TAKE 2 TABLETS BY MOUTH AT BEDTIME   calcium carbonate (OS-CAL - DOSED IN MG OF ELEMENTAL CALCIUM) 1250 (500 Ca) MG tablet Take 1 tablet by mouth 2 (two) times daily.   cyanocobalamin (,VITAMIN B-12,) 1000 MCG/ML injection INJECT 1 ML (CC) INTRAMUSCULARLY ONCE EVERY 30 DAYS   escitalopram (LEXAPRO) 5 MG tablet Take 5 mg by mouth daily.   ezetimibe (ZETIA) 10 MG tablet Take 1 tablet by mouth once daily   Multiple Vitamin (MULTIVITAMIN WITH MINERALS) TABS tablet Take 1 tablet by mouth daily. Centrum   NEEDLE, DISP, 25 G (BD ECLIPSE NEEDLE) 25G X 5/8" MISC Use for B12 injections   Suvorexant (BELSOMRA) 15 MG TABS Take 15 mg by mouth at  bedtime as needed (for insomnia, may alternate with xanax but do NOT take along with xanax). Insomnia prn, can not use with xanax.   traZODone (DESYREL) 100 MG tablet Take 1.5 tablets (150 mg total) by mouth at bedtime.   [DISCONTINUED] acetaminophen-codeine (TYLENOL/CODEINE #3) 300-30 MG tablet One qhs   No facility-administered encounter medications on file as of 07/29/2023.    Allergies: is allergic to amiodarone and procaine.  Physical Examination: Vitals: BP 124/82   Ht 5\' 5"  (1.651 m)   Wt 135 lb (61.2 kg)   BMI 22.47 kg/m  GENERAL:  Carly Rhodes is a 77 y.o. female appearing their stated age, alert and oriented x 3, in no apparent distress.  MSK: Left hip with mild tenderness to palpation along the left greater trochanter and the left buttock.  Good range of motion without any significant pain.  Hip strength 4/5 throughout. Right hip without any significant tenderness to palpation.  Good range of motion without pain.  Hip strength 4/5 throughout Thoracic and lumbar spine without any gross deformity.  No midline or paraspinal tenderness Gait-normal gait Neurovascular intact distally  Assessment & Plan Chronic left hip pain Chronic left hip pain with associated low back pain, has been on chronic regimen of Tylenol #3 at bedtime with good relief and tolerance  Plan: -Previous visit notes with Dr. Darrick Penna from 10/15/2022, 08/07/2021, 12/19/2020 reviewed in office  today [facility] PMP reviewed, no red flags -Refill Rx Tylenol #3 take 1 tab at bedtime as needed, #30 tabs, 1 refill.  Cautioned and combining together with her Xanax. -Follow-up with Dr. Darrick Penna prior to next refill Chronic pain syndrome Chronic left hip pain with associated low back pain, has been on chronic regimen of Tylenol #3 at bedtime with good relief and tolerance  Plan: -Previous visit notes with Dr. Darrick Penna from 10/15/2022, 08/07/2021, 12/19/2020 reviewed in office today -[place] PMP reviewed, no  red flags -Refill Rx Tylenol #3 take 1 tab at bedtime as needed, #30 tabs, 1 refill.  Cautioned and combining together with her Xanax. -Follow-up with Dr. Darrick Penna prior to next refill    Patient expressed understanding & agreement with above.  Encounter Diagnoses  Name Primary?   Chronic left hip pain Yes   Chronic pain syndrome     No orders of the defined types were placed in this encounter.

## 2023-08-03 DIAGNOSIS — E538 Deficiency of other specified B group vitamins: Secondary | ICD-10-CM | POA: Diagnosis not present

## 2023-08-09 DIAGNOSIS — E538 Deficiency of other specified B group vitamins: Secondary | ICD-10-CM | POA: Diagnosis not present

## 2023-08-17 DIAGNOSIS — E538 Deficiency of other specified B group vitamins: Secondary | ICD-10-CM | POA: Diagnosis not present

## 2023-09-03 ENCOUNTER — Other Ambulatory Visit: Payer: Self-pay | Admitting: Internal Medicine

## 2023-09-03 DIAGNOSIS — I482 Chronic atrial fibrillation, unspecified: Secondary | ICD-10-CM

## 2023-09-03 NOTE — Telephone Encounter (Signed)
Prescription refill request for Eliquis received. Indication:afib Last office visit:10/24 [id number] labs Age: 78 Weight:61.2 kg  Prescription refill

## 2023-10-12 ENCOUNTER — Other Ambulatory Visit: Payer: Self-pay | Admitting: Internal Medicine

## 2023-10-12 DIAGNOSIS — I482 Chronic atrial fibrillation, unspecified: Secondary | ICD-10-CM

## 2023-10-12 NOTE — Telephone Encounter (Signed)
 Prescription refill request for Eliquis received. Indication:afib Last office visit:10/24 [id number] labs Age: 78 Weight:61.2  kg  Prescription refilled

## 2023-10-27 DIAGNOSIS — E538 Deficiency of other specified B group vitamins: Secondary | ICD-10-CM | POA: Diagnosis not present

## 2023-10-27 DIAGNOSIS — E782 Mixed hyperlipidemia: Secondary | ICD-10-CM | POA: Diagnosis not present

## 2023-11-03 DIAGNOSIS — E538 Deficiency of other specified B group vitamins: Secondary | ICD-10-CM | POA: Diagnosis not present

## 2023-11-10 DIAGNOSIS — E538 Deficiency of other specified B group vitamins: Secondary | ICD-10-CM | POA: Diagnosis not present

## 2023-11-12 ENCOUNTER — Other Ambulatory Visit: Payer: Self-pay | Admitting: Internal Medicine

## 2023-11-12 DIAGNOSIS — I482 Chronic atrial fibrillation, unspecified: Secondary | ICD-10-CM

## 2023-11-15 NOTE — Telephone Encounter (Signed)
 Prescription refill request for Eliquis received. Indication:afib Last office visit:10/24 [id number] labs Age: 78 Weight:61.2  kg  Prescription refilled

## 2023-11-17 DIAGNOSIS — E538 Deficiency of other specified B group vitamins: Secondary | ICD-10-CM | POA: Diagnosis not present

## 2023-11-18 ENCOUNTER — Ambulatory Visit: Admitting: Sports Medicine

## 2023-11-18 VITALS — BP 108/70 | Ht 65.5 in | Wt 140.0 lb

## 2023-11-18 DIAGNOSIS — M76822 Posterior tibial tendinitis, left leg: Secondary | ICD-10-CM

## 2023-11-18 DIAGNOSIS — M76821 Posterior tibial tendinitis, right leg: Secondary | ICD-10-CM | POA: Diagnosis not present

## 2023-11-18 DIAGNOSIS — M25552 Pain in left hip: Secondary | ICD-10-CM | POA: Diagnosis not present

## 2023-11-18 DIAGNOSIS — G8929 Other chronic pain: Secondary | ICD-10-CM | POA: Diagnosis not present

## 2023-11-18 MED ORDER — ACETAMINOPHEN-CODEINE 300-30 MG PO TABS: ORAL_TABLET | ORAL | 0 refills | Status: AC

## 2023-11-18 NOTE — Progress Notes (Signed)
 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 She has had episodes of posterior tibial tendinitis on the right Has had episodes of posterior tibial tendon irritation on the left and some of what sounds like tarsal tunnel syndrome Over the past month she has had more trouble The pain resolved once she went back to using her body helix ankle sleeve and stayed consistently in her orthotics  He comes to see if we can prevent this from recurring  Physical exam : White female in no acute distress taking BP 108/70   Ht 5' 5.5" (1.664 m)   Wt 140 lb (63.5 kg)   BMI 22.94 kg/m   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

## 2023-11-18 NOTE — Assessment & Plan Note (Signed)
 Continue using orthotic support and we will have her return for new orthotics with better arch support on the left Continue to use the body helix compression sleeve for the ankle and arch when needed but use it for a minimum of a week anytime she she gets the tarsal tunnel symptoms  Start her on some heel raises with the toes turned inward  Recheck periodically as needed  Tylenol with codeine was refilled today as this has been very helpful for her chronic knee hip and foot pain She generally uses only a tablet at night and does not take it every night

## 2023-11-24 DIAGNOSIS — E538 Deficiency of other specified B group vitamins: Secondary | ICD-10-CM | POA: Diagnosis not present

## 2023-12-01 DIAGNOSIS — E538 Deficiency of other specified B group vitamins: Secondary | ICD-10-CM | POA: Diagnosis not present

## 2023-12-07 ENCOUNTER — Ambulatory Visit: Admitting: Sports Medicine

## 2023-12-07 VITALS — BP 110/72 | Ht 65.5 in | Wt 140.0 lb

## 2023-12-07 DIAGNOSIS — M76822 Posterior tibial tendinitis, left leg: Secondary | ICD-10-CM

## 2023-12-07 DIAGNOSIS — M76821 Posterior tibial tendinitis, right leg: Secondary | ICD-10-CM

## 2023-12-07 NOTE — Assessment & Plan Note (Signed)
 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 The orthotic was heated and afterward the patient stood on the orthotic blank positioned on the orthotic stand. The patient was positioned in subtalar neutral position and 10 degrees of ankle dorsiflexion in a weight bearing stance. After completion of molding, a stable base was applied to the orthotic blank. The blank was ground to a stable position for weight bearing. Size: 10 Fit and Run Base: incorporated Posting: Scaphoid pads bilaterally Additional orthotic padding: none  Patient had good comfort with walking after completion and this did block her pronation and allow her to return to neutral gait  Scaphoid pads were also placed in her sandals to give her the support  Return as needed

## 2023-12-07 NOTE — Progress Notes (Signed)
 Chief complaint ; Patient comes for new orthotics  This patient has used orthotics for years because of longitudinal arch collapse pronation and gait changes.  These have led to some chronic problems with plantar fasciitis.  In April of this year she had posterior tibial tendinitis on the left that has improved with exercises and some additional arch padding. She brings in her older orthotics for additional arch padding and wants a new pair made today.  Physical exam Elderly white female in no acute distress BP 110/72   Ht 5' 5.5" (1.664 m)   Wt 140 lb (63.5 kg)   BMI 22.94 kg/m  Foot exam does reveal some loss of longitudinal arch bilaterally She is not having the tenderness over the posterior tibialis she had before She has some mild tenderness in both arches

## 2023-12-08 DIAGNOSIS — E538 Deficiency of other specified B group vitamins: Secondary | ICD-10-CM | POA: Diagnosis not present

## 2023-12-08 DIAGNOSIS — F330 Major depressive disorder, recurrent, mild: Secondary | ICD-10-CM | POA: Diagnosis not present

## 2023-12-15 DIAGNOSIS — E538 Deficiency of other specified B group vitamins: Secondary | ICD-10-CM | POA: Diagnosis not present

## 2023-12-22 DIAGNOSIS — E538 Deficiency of other specified B group vitamins: Secondary | ICD-10-CM | POA: Diagnosis not present

## 2023-12-29 DIAGNOSIS — E538 Deficiency of other specified B group vitamins: Secondary | ICD-10-CM | POA: Diagnosis not present

## 2023-12-31 ENCOUNTER — Other Ambulatory Visit: Payer: Self-pay | Admitting: Internal Medicine

## 2023-12-31 DIAGNOSIS — I482 Chronic atrial fibrillation, unspecified: Secondary | ICD-10-CM

## 2023-12-31 NOTE — Telephone Encounter (Signed)
 Prescription refill request for Eliquis  received. Indication: Afib  Last office visit: 05/26/23 Carly Rhodes)  Scr: overdue Age: 78 Weight: 63.5kg  Labs overdue. Called PCP and requested updated labs.

## 2023-12-31 NOTE — Telephone Encounter (Signed)
 Labs received from PCP. Scr 0.72 on 10/11/23. Appropriate dose. Refill sent.

## 2024-01-05 DIAGNOSIS — E538 Deficiency of other specified B group vitamins: Secondary | ICD-10-CM | POA: Diagnosis not present

## 2024-01-12 DIAGNOSIS — E538 Deficiency of other specified B group vitamins: Secondary | ICD-10-CM | POA: Diagnosis not present

## 2024-01-19 DIAGNOSIS — E538 Deficiency of other specified B group vitamins: Secondary | ICD-10-CM | POA: Diagnosis not present

## 2024-01-26 DIAGNOSIS — E538 Deficiency of other specified B group vitamins: Secondary | ICD-10-CM | POA: Diagnosis not present

## 2024-02-01 DIAGNOSIS — R8290 Unspecified abnormal findings in urine: Secondary | ICD-10-CM | POA: Diagnosis not present

## 2024-02-01 DIAGNOSIS — E538 Deficiency of other specified B group vitamins: Secondary | ICD-10-CM | POA: Diagnosis not present

## 2024-02-01 DIAGNOSIS — N390 Urinary tract infection, site not specified: Secondary | ICD-10-CM | POA: Diagnosis not present

## 2024-02-01 DIAGNOSIS — R5383 Other fatigue: Secondary | ICD-10-CM | POA: Diagnosis not present

## 2024-02-01 DIAGNOSIS — E782 Mixed hyperlipidemia: Secondary | ICD-10-CM | POA: Diagnosis not present

## 2024-02-01 DIAGNOSIS — E678 Other specified hyperalimentation: Secondary | ICD-10-CM | POA: Diagnosis not present

## 2024-02-03 DIAGNOSIS — E871 Hypo-osmolality and hyponatremia: Secondary | ICD-10-CM | POA: Diagnosis not present

## 2024-02-09 DIAGNOSIS — E538 Deficiency of other specified B group vitamins: Secondary | ICD-10-CM | POA: Diagnosis not present

## 2024-02-16 DIAGNOSIS — E538 Deficiency of other specified B group vitamins: Secondary | ICD-10-CM | POA: Diagnosis not present

## 2024-02-23 DIAGNOSIS — E538 Deficiency of other specified B group vitamins: Secondary | ICD-10-CM | POA: Diagnosis not present

## 2024-03-01 DIAGNOSIS — E538 Deficiency of other specified B group vitamins: Secondary | ICD-10-CM | POA: Diagnosis not present

## 2024-03-08 DIAGNOSIS — E538 Deficiency of other specified B group vitamins: Secondary | ICD-10-CM | POA: Diagnosis not present

## 2024-03-15 ENCOUNTER — Telehealth: Payer: Self-pay | Admitting: Neurology

## 2024-03-15 DIAGNOSIS — E538 Deficiency of other specified B group vitamins: Secondary | ICD-10-CM | POA: Diagnosis not present

## 2024-03-15 NOTE — Telephone Encounter (Signed)
 Called the patient back. I was able to get her worked in for an apt tomorrow with Dr Neurosurgeon. She is questioning if she needs an MRI. Advised since we have not seen her since 2023, she would need follow up. Apt made for tomorrow at 3 check in 2:30. Needs MMSE

## 2024-03-15 NOTE — Telephone Encounter (Signed)
 Pt came into the office requesting an appointment. Pt states she is having mini strokes and would ike a MRI and to get worked in asap. Would like a call to discuss.

## 2024-03-16 ENCOUNTER — Ambulatory Visit: Admitting: Neurology

## 2024-03-16 ENCOUNTER — Encounter: Payer: Self-pay | Admitting: Neurology

## 2024-03-16 VITALS — BP 131/85 | HR 64 | Ht 65.0 in | Wt 145.0 lb

## 2024-03-16 DIAGNOSIS — R519 Headache, unspecified: Secondary | ICD-10-CM | POA: Diagnosis not present

## 2024-03-16 DIAGNOSIS — I679 Cerebrovascular disease, unspecified: Secondary | ICD-10-CM | POA: Insufficient documentation

## 2024-03-16 DIAGNOSIS — G3184 Mild cognitive impairment, so stated: Secondary | ICD-10-CM

## 2024-03-16 NOTE — Progress Notes (Signed)
 Provider:  Dedra Gores, MD  Primary Care Physician:  Rolinda Millman, MD [id number] [address] [place] [address]     Referring Provider: Rolinda Millman, Md [phone] WS[address] [place],  [place] [address]          Chief Complaint according to patient   Patient presents with:                HISTORY OF PRESENT ILLNESS:  Carly Rhodes is a 78 y.o. female patient who is here for revisit 03/16/2024 for  cognitive decline , having been followed by Dr Tucker in the past ,  originally seen by Dr Georjean, MD, dx as MCI. She has several TIAs and CVA.  Hx of vit B 12 deficiency.  Anxiety. .   She was not hospitalized again since last seen - has only reported a feeling of head pressure, not pain. No falls.  Chief concern according to patient :  head pressure for the last 2 weeks.   7 hours 5 minutesThe last time I had the pleasure of meeting Carly Rhodes, we concentrated on cognitive testing.  My assistant today did another Mini-Mental status examination and the patient scored 21 out of 30 points after she failed to do serial 7 math test.  I like the world spelling apart and she scored 5 out of 5 in that segment.  So all over she lost 2 points on short-term recall, she should not have lost a point on the drawing because she did produce 2 interlocking pentagrams, and she would have scored 26 out of 30 points with these corrections.  I attributed the pressure sensation to weather/ barometric pressure.         Carly Rhodes is a 78 y.o. female and seen here as a transfer of care from Dr Jenel. She reports suffering an acute Encephalopathy in May 2019,  Her headache was extraordinary, she called her PCP office and when she couldn't stand it anymore , she presented to EMS, and was admitted to [facility], She feels her memory has suffered progressively since. 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    She is a self described  horrible pack rat and clutterer  , or hoarder. She believes she had a viral brain infection.  Her husband passed away 27-Oct-2020she passed out in front of his bed- had abdominal pain, was diagnosed with ? Pancreatitis from gall stone? A pancreatic sten was  placed- she doesn't remember her diagnosis. She resumed eloquis after surgery/    She is alone now, daughter lives in [place]. She lets nobody come into her house.  She has developed atrial fibrillation in her hospitalization time , but her cardiologist  Cognitive impairment.  MMSE here 24/ 30 points.  Her time frame is off , her memory for names and words is affected. Short term memory is severely affected, yet she drove here by herself.  She doesn't get lost she stated , and she rides her bike 12 miles a day ( if that is true - WOW !).      This patient has no summarized history : by her won account she had a reading and writing learning disability as did her MGF and several uncles and aunts ts who never learn to write.  She finished HS, married twice, never could spell- this may still affect her current performance when tested for memory.  Non smoker, non drinker, caffeine ; coffee in AM.  Worked as a Arboriculturist.  Has 2 children, but close contact to one daughter.  Dr Georjean: 12-09-2019 HISTORY OF PRESENT ILLNESS: This is a 78 year old right-handed woman with a history of atrial fibrillation on Eliquis , hypothyroidism, anxiety, depression, presenting for evaluation after hospitalization for encephalopathy. She was under the impression that she had a brain infection at that time and wonders is the brain infection is causing her fatigue. Records from her hospitalization in May 2019 were reviewed and it was clarified with the patient that she did not have a brain infection during her hospital admission. She was evaluated by Neurology and had an unremarkable MRI brain, EEG, and lumbar puncture, diagnosis of altered mental status in the setting  of high fever, most likely explanation is viral syndrome with delirium with post-viral labyrinthitis. She presented to the hospital for headaches and diffuse weakness leading to a fall. She had not been eating for a few days. Her husband was unable to assist her off the floor. She woke up the next morning with speech difficulty, she could understand people but could not speak. She was febrile on arrival to the ED and was noted to be able to give history but show signs of altered mental status. She was found to have atrial fibrillation with RVR and started on Eliquis . I personally reviewed MRI brain with and without contrast done 01/13/18 which did not show any acute changes, there was mild diffuse atrophy, mild chronic microvascular disease. Cuts through the IACs showed normal 7th and 8th cranial nerves, no enhancing mass in temporal region. Her EEG was normal. She had a lumbar puncture with CSF showing WBC of 2, RBC 4400 in tub 1, 515 in tube 4, normal protein, glucose, negative HSV 1 and 2, negative CSF culture. She was treated empirically with acyclovir , ceftriaxone , and ampicillin , which were discontinued when cultures came back negative. During her stay, she developed vertigo and persistent right-beating nystagmus with rotary component in all directions, consistent with labyrinthitis. She described illusions/delusions during her stay.    She denies any further headaches, vertigo, or hallucinations since her hospitalization in May 2019.  SABRA     Review of Systems: Out of a complete 14 system review, the patient complains of only the following symptoms, and all other reviewed systems are negative.:   SLEEPINESS ?  How likely are you to doze in the following situations: 0 = not likely, 1 = slight chance, 2 = moderate chance, 3 = high chance  Sitting and Reading? Watching Television? Sitting inactive in a public place (theater or meeting)? Lying down in the afternoon when circumstances  permit? Sitting and talking to someone? Sitting quietly after lunch without alcohol? In a car, while stopped for a few minutes in traffic? As a passenger in a car for an hour without a break?  Total =        Social History   Socioeconomic History   Marital status: Widowed    Spouse name: Not on file   Number of children: Not on file   Years of education: 12   Highest education level: Not on file  Occupational History   Not on file  Tobacco Use   Smoking status: Never   Smokeless tobacco: Never  Vaping Use   Vaping status: Never Used  Substance and Sexual Activity   Alcohol use: Never   Drug use: No   Sexual activity: Not Currently    Birth control/protection:  Post-menopausal, Surgical  Other Topics Concern   Not on file  Social History Narrative   Pt lives in 2 story home with her husband   Current care giver to husband   Right handed   Drinks 2-3 cups caffeine daily   Social Drivers of Health   Financial Resource Strain: Not on file  Food Insecurity: Not on file  Transportation Needs: Not on file  Physical Activity: Not on file  Stress: Not on file  Social Connections: Not on file    Family History  Problem Relation Age of Onset   Stomach cancer Brother 59   Colon cancer Paternal Grandmother 65   Diabetes Father    Hypertension Sister    Heart attack Neg Hx     Past Medical History:  Diagnosis Date   Anxiety    Atrial fibrillation (HCC)    Choledocholithiasis    Depression    Dysrhythmia    Family history of anesthesia complication    sister extreme nausea & vomiting   Hepatitis 1953   contagious type   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   Osteoporosis    Seasonal allergies     Past Surgical History:  Procedure Laterality Date   ABDOMINAL HYSTERECTOMY  1984   APPENDECTOMY  1984   at time of Hysterectomy   BILIARY DILATION  07/21/2019   Procedure: BILIARY DILATION;  Surgeon: Wilhelmenia Aloha Raddle., MD;  Location: [facility] ENDOSCOPY;  Service:  Gastroenterology;;   CHOLECYSTECTOMY N/A 07/22/2019   Procedure: LAPAROSCOPIC CHOLECYSTECTOMY;  Surgeon: Signe Mitzie LABOR, MD;  Location: [facility] OR;  Service: General;  Laterality: N/A;   ERCP N/A 07/21/2019   Procedure: ENDOSCOPIC RETROGRADE CHOLANGIOPANCREATOGRAPHY (ERCP);  Surgeon: Wilhelmenia Aloha Raddle., MD;  Location: [facility] ENDOSCOPY;  Service: Gastroenterology;  Laterality: N/A;   ERCP     HAMMER TOE SURGERY  1993   2nd toe left foot   PANCREATIC STENT PLACEMENT  07/21/2019   Procedure: PANCREATIC STENT PLACEMENT;  Surgeon: Wilhelmenia Aloha Raddle., MD;  Location: [facility] ENDOSCOPY;  Service: Gastroenterology;;   REMOVAL OF STONES  07/21/2019   Procedure: REMOVAL OF STONES;  Surgeon: Wilhelmenia Aloha Raddle., MD;  Location: [facility] ENDOSCOPY;  Service: Gastroenterology;;   ANNETT  07/21/2019   Procedure: ANNETT;  Surgeon: Wilhelmenia Aloha Raddle., MD;  Location: [facility] ENDOSCOPY;  Service: Gastroenterology;;   THYROID  LOBECTOMY  09/15/2012   Procedure: THYROID  LOBECTOMY;  Surgeon: Norleen Notice, MD;  Location: [facility] OR;  Service: ENT;  Laterality: Left;   THYROIDECTOMY, PARTIAL  09/15/2012   Dr Notice   TONSILLECTOMY  [phone]     Current Outpatient Medications on File Prior to Visit  Medication Sig Dispense Refill   ALPRAZolam  (XANAX ) 0.5 MG tablet TAKE 1/2 (ONE-HALF) TO 1 TABLET BY MOUTH THREE TIMES DAILY AS NEEDED 60 tablet 0   apixaban  (ELIQUIS ) 5 MG TABS tablet Take 1 tablet (5 mg total) by mouth 2 (two) times daily. 180 tablet 1   B-D TB SYRINGE 1CC/25GX5/8 25G X 5/8 1 ML MISC USE FOR B12 INJECTIONS     baclofen  (LIORESAL ) 10 MG tablet TAKE 2 TABLETS BY MOUTH AT BEDTIME 60 tablet 0   calcium carbonate (OS-CAL - DOSED IN MG OF ELEMENTAL CALCIUM) 1250 (500 Ca) MG tablet Take 1 tablet by mouth 2 (two) times daily.     cyanocobalamin  (,VITAMIN B-12,) 1000 MCG/ML injection INJECT 1 ML (CC) INTRAMUSCULARLY ONCE EVERY 30 DAYS 3 mL 0   escitalopram  (LEXAPRO ) 5 MG tablet Take 5 mg by mouth daily.  ezetimibe  (ZETIA ) 10 MG tablet Take 1 tablet by mouth once daily 90 tablet 0   Multiple Vitamin (MULTIVITAMIN WITH MINERALS) TABS tablet Take 1 tablet by mouth daily. Centrum     NEEDLE, DISP, 25 G (BD ECLIPSE NEEDLE) 25G X 5/8 MISC Use for B12 injections 100 each 3   Suvorexant  (BELSOMRA ) 15 MG TABS Take 15 mg by mouth at bedtime as needed (for insomnia, may alternate with xanax  but do NOT take along with xanax ). Insomnia prn, can not use with xanax . 30 tablet 0   traZODone  (DESYREL ) 100 MG tablet Take 1.5 tablets (150 mg total) by mouth at bedtime. 45 tablet 0   No current facility-administered medications on file prior to visit.    Allergies  Allergen Reactions   Amiodarone  Other (See Comments)    Hair loss   Procaine Other (See Comments)    REACTION:  Lowers blood pressure, makes me loopy, can't move or talk     DIAGNOSTIC DATA (LABS, IMAGING, TESTING) - I reviewed patient records, labs, notes, testing and imaging myself where available.  Lab Results  Component Value Date   WBC 5.5 10/23/2019   HGB 14.1 10/23/2019   HCT 44.5 10/23/2019   MCV 89 10/23/2019   PLT 240 10/23/2019      Component Value Date/Time   NA 143 10/23/2019 0913   K 4.1 10/23/2019 0913   CL 105 10/23/2019 0913   CO2 23 07/22/2019 1413   GLUCOSE 87 10/23/2019 0913   GLUCOSE 128 (H) 07/22/2019 1413   BUN 12 10/23/2019 0913   CREATININE 0.83 10/23/2019 0913   CALCIUM 9.9 10/23/2019 0913   PROT 6.4 10/23/2019 0913   ALBUMIN 4.4 10/23/2019 0913   AST 23 10/23/2019 0913   ALT 17 08/23/2019 0931   ALKPHOS 110 10/23/2019 0913   BILITOT 0.8 10/23/2019 0913   GFRNONAA 69 10/23/2019 0913   GFRAA 80 10/23/2019 0913   Lab Results  Component Value Date   CHOL 173 10/23/2019   HDL 46 10/23/2019   LDLCALC 103 (H) 10/23/2019   LDLDIRECT 102.0 11/02/2018   TRIG 133 10/23/2019   CHOLHDL 3.8 10/23/2019   No results found for: HGBA1C Lab Results  Component Value Date   VITAMINB12 705 10/23/2019    Lab Results  Component Value Date   TSH 1.200 10/23/2019    PHYSICAL EXAM:  Vitals:   03/16/24 1524  BP: 131/85  Pulse: 64   No data found. Body mass index is 24.13 kg/m.   Wt Readings from Last 3 Encounters:  03/16/24 145 lb (65.8 kg)  12/07/23 140 lb (63.5 kg)  11/18/23 140 lb (63.5 kg)     Ht Readings from Last 3 Encounters:  03/16/24 5' 5 (1.651 m)  12/07/23 5' 5.5 (1.664 m)  11/18/23 5' 5.5 (1.664 m)      General: The patient is awake, alert and appears not in acute distress and groomed.  Respiratory: no shortness of breath  Skin:  Without evidence of ankle edema, or rash.     NEUROLOGIC EXAM: The patient is awake and alert, oriented to place and time.   Memory subjective described as : not progressing .    03/16/2024    3:26 PM 05/27/2022    8:20 AM 12/08/2021    3:44 PM 04/07/2021    2:42 PM  MMSE - Mini Mental State Exam  Orientation to time 3 5 4 5   Orientation to Place 5 5 5 5   Registration 3 3 3 3   Attention/  Calculation  WORLD     5 1 1  0  Recall 1 3 2 2   Language- name 2 objects 2 2 2 2   Language- repeat 1 1 1  0  Language- follow 3 step command 3 3 3 3   Language- read & follow direction 1 1 1 1   Write a sentence 1 1 1 1   Copy design 1 1 1 1   Total score 26 26 24 23      Attention span & concentration ability appears impaired,   Speech is fluent,  without  dysarthria, dysphonia or aphasia.  Mood and affect are appropriate.   Cranial nerves: no loss of smell or taste reported  Pupils are equal and briskly reactive to light. Funduscopic exam def. .  Extraocular movements in vertical and horizontal planes were intact and without nystagmus. No Diplopia. Visual fields by finger perimetry are intact. Hearing was intact to soft voice and finger rubbing.    Facial sensation intact to fine touch.  Facial motor strength is symmetric and tongue and uvula move midline.  Neck ROM : rotation, tilt and flexion extension were normal for age  and shoulder shrug was symmetrical.    Motor exam:  Symmetric bulk, tone and ROM.   Normal tone without cog wheeling, symmetric grip strength .   Sensory:  Fine touch, pinprick and vibration were tested  and  normal.  Proprioception tested in the upper extremities was normal.   Coordination: Rapid alternating movements in the fingers/hands were of normal speed.  The Finger-to-nose maneuver was intact without evidence of ataxia, dysmetria or tremor.   Deep tendon reflexes: in the  upper and lower extremities are symmetric and intact.    ASSESSMENT AND PLAN :   78 y.o. year old female  here with:    1) stable cognitive function,  considered mild- moderately impaired. She reports that she is driving and not getting lost but she has also problems with organization.  She does misplace checks, she has anxiety, she is concerned about new strokes.   2) new onset pressure  head sensation.  No new balance issues but  certainly appears more frazzled or inattentive- Brain  MRI ordered.   3)  please note report of learning  disabilities for math and reading.  MMSE  testing by pama WORLD spelling only,      I would like to thank  Rolinda Millman, Md [address] [place],  [place] [address] for allowing me to meet with this pleasant patient.    After spending a total time of  45  minutes face to face and time for  history taking, physical and neurologic examination, review of laboratory studies,  personal review of imaging studies, reports and results of other testing and review of referral information / records as far as provided in visit,   Electronically signed by: Dedra Gores, MD 03/16/2024 3:43 PM  [facility] Sleep Board certified by The [employer] of Sleep Medicine and Diplomate of the [employer] of Sleep Medicine. Board certified In Neurology through the ABPN, Fellow of the [employer] of Neurology.

## 2024-03-16 NOTE — Patient Instructions (Signed)
 78 y.o. year old female  here with:     1) stable cognitive function,  considered mild- moderately impaired. She reports that she is driving and not getting lost but she has also problems with organization.  She does misplace checks, she has anxiety, she is concerned about new strokes.    2) new onset pressure  head sensation.  No new balance issues but  certainly appears more frazzled or inattentive-    Brain  MRI ordered.    PS : please note report of learning  disabilities for math and reading.  MMSE  testing by pama WORLD spelling only,

## 2024-03-20 ENCOUNTER — Telehealth: Payer: Self-pay | Admitting: Neurology

## 2024-03-20 NOTE — Telephone Encounter (Signed)
 no auth required sent to GI [phone]

## 2024-03-22 DIAGNOSIS — E538 Deficiency of other specified B group vitamins: Secondary | ICD-10-CM | POA: Diagnosis not present

## 2024-03-27 ENCOUNTER — Ambulatory Visit
Admission: RE | Admit: 2024-03-27 | Discharge: 2024-03-27 | Disposition: A | Discharge status: Home / Self Care | Source: Ambulatory Visit | Attending: Neurology | Admitting: Neurology

## 2024-03-27 DIAGNOSIS — R519 Headache, unspecified: Secondary | ICD-10-CM

## 2024-03-27 DIAGNOSIS — G3184 Mild cognitive impairment, so stated: Secondary | ICD-10-CM

## 2024-03-27 DIAGNOSIS — I679 Cerebrovascular disease, unspecified: Secondary | ICD-10-CM

## 2024-03-28 ENCOUNTER — Other Ambulatory Visit (HOSPITAL_COMMUNITY): Payer: Self-pay

## 2024-03-28 ENCOUNTER — Ambulatory Visit: Payer: Self-pay | Admitting: Neurology

## 2024-03-29 DIAGNOSIS — E538 Deficiency of other specified B group vitamins: Secondary | ICD-10-CM | POA: Diagnosis not present

## 2024-03-30 NOTE — Telephone Encounter (Signed)
 Called the patient and reviewed the MRI of the brain results. Pt verbalized understanding. Pt had no questions at this time but was encouraged to call back if questions arise.

## 2024-03-30 NOTE — Telephone Encounter (Signed)
-----   Message from [place] Dohmeier sent at 03/28/2024  4:49 PM EDT ----- Levada showed mild generalized cortical atrophy, with no location predominance,  normal for age.  This rules out mini-strokes or bleeds, or tumors etc as cause for memory impairment.  ----- Message ----- From: Vear Charlie LABOR, MD Sent: 03/27/2024   8:25 PM EDT To: Dedra Gores, MD

## 2024-04-05 DIAGNOSIS — E538 Deficiency of other specified B group vitamins: Secondary | ICD-10-CM | POA: Diagnosis not present

## 2024-04-12 DIAGNOSIS — I480 Paroxysmal atrial fibrillation: Secondary | ICD-10-CM | POA: Diagnosis not present

## 2024-04-12 DIAGNOSIS — F324 Major depressive disorder, single episode, in partial remission: Secondary | ICD-10-CM | POA: Diagnosis not present

## 2024-04-12 DIAGNOSIS — E538 Deficiency of other specified B group vitamins: Secondary | ICD-10-CM | POA: Diagnosis not present

## 2024-04-12 DIAGNOSIS — E871 Hypo-osmolality and hyponatremia: Secondary | ICD-10-CM | POA: Diagnosis not present

## 2024-04-12 DIAGNOSIS — E782 Mixed hyperlipidemia: Secondary | ICD-10-CM | POA: Diagnosis not present

## 2024-04-12 DIAGNOSIS — F419 Anxiety disorder, unspecified: Secondary | ICD-10-CM | POA: Diagnosis not present

## 2024-04-19 DIAGNOSIS — E538 Deficiency of other specified B group vitamins: Secondary | ICD-10-CM | POA: Diagnosis not present

## 2024-04-26 DIAGNOSIS — E538 Deficiency of other specified B group vitamins: Secondary | ICD-10-CM | POA: Diagnosis not present

## 2024-05-03 DIAGNOSIS — E538 Deficiency of other specified B group vitamins: Secondary | ICD-10-CM | POA: Diagnosis not present

## 2024-05-08 NOTE — Progress Notes (Signed)
 SABRA

## 2024-05-09 DIAGNOSIS — E538 Deficiency of other specified B group vitamins: Secondary | ICD-10-CM | POA: Diagnosis not present

## 2024-05-09 DIAGNOSIS — K625 Hemorrhage of anus and rectum: Secondary | ICD-10-CM | POA: Diagnosis not present

## 2024-05-10 ENCOUNTER — Telehealth: Payer: Self-pay | Admitting: Gastroenterology

## 2024-05-10 NOTE — Progress Notes (Unsigned)
 Ellouise Console, PA-C [address] [place], [place]  [address] Phone: [phone]   Gastroenterology Consultation  Referring Provider:     Rolinda Millman, MD Primary Care Physician:  Rolinda Millman, MD Primary Gastroenterologist:  Ellouise Console, PA-C / Sandor Flatter, MD  Reason for Consultation:     Rectal bleeding        HPI:   Carly  P Rhodes is a 78 y.o. y/o female referred for consultation & management  by Rolinda Millman, MD.    Current symptoms: Here to evaluate rectal bleeding.  Patient saw her PCP Dr. Rolinda 05/09/2024 to evaluate for 3 week history of bright red blood in stool.  Has not had any rectal pain.  She had 2 episodes of mild blood in stool after bowel movement and became very concerned.  Rectal bleeding has currently resolved.  Stools have been loose since having cholecystectomy in 2020.  She typically has 1 formed bowel movement daily.  Has an episode of diarrhea every 3 or 4 days.  This improves if she avoids greasy or fatty foods.  She eats very healthy.  She denies abdominal pain, heartburn, dysphagia, or unintentional weight loss.  Family history significant for paternal grandmother who had colon cancer.  Rectal exam by PCP Dr. Rolinda 9/23 was Hemoccult negative.  No hemorrhoids.  CBC was ordered (no results).  Patient is requesting to have a repeat colonoscopy.    07/2019 ERCP by Dr. Wilhelmenia: Choledocholithiasis and significant biliary sludge were found. Complete removal was accomplished by biliary sphincterotomy and balloon sphincteroplasty and balloon trawl.   09/2011 last colonoscopy by Dr. Obie: Normal.  No polyps.  Good prep.  10-year repeat, pending health status.  PMH: Paroxysmal atrial fibrillation currently on Eliquis , B12 deficiency (is on B12 injection).  She is in excellent health for her age.  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 OR;  Service: General;  Laterality: N/A;   ERCP N/A 07/21/2019   Procedure: ENDOSCOPIC RETROGRADE CHOLANGIOPANCREATOGRAPHY (ERCP);  Surgeon: Wilhelmenia Aloha Raddle., MD;  Location: [facility] ENDOSCOPY;  Service: Gastroenterology;  Laterality: N/A;   ERCP     HAMMER TOE SURGERY  1993   2nd toe left foot   PANCREATIC STENT PLACEMENT  07/21/2019   Procedure: PANCREATIC STENT PLACEMENT;  Surgeon: Wilhelmenia Aloha Raddle., MD;  Location: [facility] ENDOSCOPY;  Service: Gastroenterology;;   REMOVAL OF STONES  07/21/2019   Procedure: REMOVAL OF STONES;  Surgeon: Wilhelmenia Aloha Raddle., MD;  Location: [facility] ENDOSCOPY;  Service: Gastroenterology;;   ANNETT  07/21/2019   Procedure: ANNETT;  Surgeon: Wilhelmenia Aloha Raddle., MD;  Location: [facility] ENDOSCOPY;  Service: Gastroenterology;;   THYROID  LOBECTOMY  09/15/2012   Procedure: THYROID  LOBECTOMY;  Surgeon: Norleen Notice, MD;  Location: [facility]  OR;  Service: ENT;  Laterality: Left;   THYROIDECTOMY, PARTIAL  09/15/2012   Dr Roark   TONSILLECTOMY  1957    Prior to Admission medications   Medication Sig Start Date End Date Taking? Authorizing Provider  ALPRAZolam  (XANAX ) 0.5 MG tablet TAKE 1/2 (ONE-HALF) TO 1 TABLET BY MOUTH THREE TIMES DAILY AS NEEDED 03/04/20   Myrna Camelia HERO, NP  apixaban  (ELIQUIS ) 5 MG TABS tablet Take 1 tablet (5 mg total) by mouth 2 (two) times daily. 12/31/23    Jeffrie Oneil BROCKS, MD  B-D TB SYRINGE 1CC/25GX5/8 25G X 5/8 1 ML MISC USE FOR B12 INJECTIONS 07/16/20   [provider]  baclofen  (LIORESAL ) 10 MG tablet TAKE 2 TABLETS BY MOUTH AT BEDTIME 09/27/20   Harvey Seltzer, MD  calcium carbonate (OS-CAL - DOSED IN MG OF ELEMENTAL CALCIUM) 1250 (500 Ca) MG tablet Take 1 tablet by mouth 2 (two) times daily.    [provider]  cyanocobalamin  (,VITAMIN B-12,) 1000 MCG/ML injection INJECT 1 ML (CC) INTRAMUSCULARLY ONCE EVERY 30 DAYS 09/27/20   Myrna Camelia HERO, NP  escitalopram  (LEXAPRO ) 5 MG tablet Take 5 mg by mouth daily. 09/06/20   [provider]  ezetimibe  (ZETIA ) 10 MG tablet Take 1 tablet by mouth once daily 10/30/20   Myrna Camelia HERO, NP  Multiple Vitamin (MULTIVITAMIN WITH MINERALS) TABS tablet Take 1 tablet by mouth daily. Centrum    [provider]  NEEDLE, DISP, 25 G (BD ECLIPSE NEEDLE) 25G X 5/8 MISC Use for B12 injections 10/31/19   Myrna Camelia HERO, NP  Suvorexant  (BELSOMRA ) 15 MG TABS Take 15 mg by mouth at bedtime as needed (for insomnia, may alternate with xanax  but do NOT take along with xanax ). Insomnia prn, can not use with xanax . 05/27/22   Dohmeier, Dedra, MD  traZODone  (DESYREL ) 100 MG tablet Take 1.5 tablets (150 mg total) by mouth at bedtime. 05/27/22   Dohmeier, Dedra, MD    Family History  Problem Relation Age of Onset   Stomach cancer Brother 84   Colon cancer Paternal Grandmother 16   Diabetes Father    Hypertension Sister    Heart attack Neg Hx      Social History   Tobacco Use   Smoking status: Never   Smokeless tobacco: Never  Vaping Use   Vaping status: Never Used  Substance Use Topics   Alcohol use: Never   Drug use: No    Allergies as of 05/11/2024 - Review Complete 05/11/2024  Allergen Reaction Noted   Amiodarone  Other (See Comments) 08/11/2018   Procaine Other (See Comments) 09/09/2012    Review of Systems:    All systems reviewed and negative except where noted in  HPI.   Physical Exam:  BP 110/60   Pulse 77   Ht 5' 5 (1.651 m)   Wt 140 lb (63.5 kg)   BMI 23.30 kg/m  No LMP recorded. Patient is postmenopausal.  General:   Alert,  Well-developed, well-nourished, pleasant and cooperative in NAD.  Walks with normal gait.  Appears younger than stated age.  Gets on and off exam table with no help. Lungs:  Respirations even and unlabored.  Clear throughout to auscultation.   No wheezes, crackles, or rhonchi. No acute distress. Heart:  Regular rate and rhythm; no murmurs, clicks, rubs, or gallops. Abdomen:  Normal bowel sounds.  No bruits.  Soft, and non-distended without masses, hepatosplenomegaly or hernias noted.  No Tenderness.  No guarding or rebound tenderness.    Rectal:  Patient declined until time of colonoscopy. Neurologic:  Alert and oriented x3;  grossly normal neurologically. Psych:  Alert and cooperative. Normal mood and affect.  Imaging Studies: No results found.  Labs: CBC    Component Value Date/Time   WBC 5.5 10/23/2019 0913   WBC 8.1 07/23/2019 0354   RBC 5.03 10/23/2019 0913   RBC 4.16 07/23/2019 0354   HGB 14.1 10/23/2019 0913   HCT 44.5 10/23/2019 0913   PLT 240 10/23/2019 0913   MCV 89 10/23/2019 0913    CMP     Component Value Date/Time   NA 143 10/23/2019 0913   K 4.1 10/23/2019 0913   CL 105 10/23/2019 0913   CO2 23 07/22/2019 1413   GLUCOSE 87 10/23/2019 0913   GLUCOSE 128 (H) 07/22/2019 1413   BUN 12 10/23/2019 0913   CREATININE 0.83 10/23/2019 0913   CALCIUM 9.9 10/23/2019 0913   PROT 6.4 10/23/2019 0913   ALBUMIN 4.4 10/23/2019 0913   AST 23 10/23/2019 0913   ALT 17 08/23/2019 0931   ALKPHOS 110 10/23/2019 0913   BILITOT 0.8 10/23/2019 0913   GFRNONAA 69 10/23/2019 0913   GFRAA 80 10/23/2019 0913    Assessment and Plan:   Annabel  P Petruzzi is a 78 y.o. y/o female has been referred for:   1.  Rectal bleeding: Mild, 2 episodes in the past 3 weeks, currently resolved. - Scheduling Colonoscopy I  discussed risks of colonoscopy with patient to include risk of bleeding, colon perforation, and risk of sedation.  Patient expressed understanding and agrees to proceed with colonoscopy.   2.  Paroxysmal atrial fibrillation, on Eliquis , asymptomatic - Request permission from cardiologist Dr. Waddell to hold Eliquis  2 days prior to colonoscopy procedure.  Follow up based on colonoscopy results and GI symptoms.  Ellouise Console, PA-C

## 2024-05-10 NOTE — Telephone Encounter (Signed)
 PT is experiencing rectal bleeding sched appt on 07-03-24 w Ellouise Console. But pt is wanting to speak to a nurse regarding her symptoms and if possible to get in earlier.

## 2024-05-10 NOTE — Telephone Encounter (Signed)
 Patient calls with complaints of 2 recent occasions brbpr. States that on one occasion, there seemed to be a fairly large amount of blood. Denies any rectal pain. Denies any abdominal pain. No history of colon polyps, last colon 2013 with Dr Leigh deferring any additional colonoscopies due to age. Patient is on Eliquis . States prior to her bleeding episodes, she had a viral illness that caused her extreme fatigue and looser bowel movements. States she now is having difficulty with constipation which she did not previously have.   Patient went to see her PCP yesterday at which time CBC was drawn, hgb 14.7. Rectal exam performed and hemoccults were negative.   Patient is extremely anxious regarding her recent episodes of bleeding and is requesting a sooner appointment.  Ellouise Console, PA-C has had a cancellation for tomorrow, 05/11/24, so I have placed patient on the schedule to see her for reassurance/evaluation.

## 2024-05-11 ENCOUNTER — Ambulatory Visit: Admitting: Physician Assistant

## 2024-05-11 ENCOUNTER — Encounter: Payer: Self-pay | Admitting: Physician Assistant

## 2024-05-11 ENCOUNTER — Telehealth: Payer: Self-pay

## 2024-05-11 VITALS — BP 110/60 | HR 77 | Ht 65.0 in | Wt 140.0 lb

## 2024-05-11 DIAGNOSIS — K625 Hemorrhage of anus and rectum: Secondary | ICD-10-CM | POA: Diagnosis not present

## 2024-05-11 DIAGNOSIS — Z800 Family history of malignant neoplasm of digestive organs: Secondary | ICD-10-CM

## 2024-05-11 DIAGNOSIS — Z7901 Long term (current) use of anticoagulants: Secondary | ICD-10-CM

## 2024-05-11 MED ORDER — NA SULFATE-K SULFATE-MG SULF 17.5-3.13-1.6 GM/177ML PO SOLN: 1.0000 | Freq: Once | ORAL | 0 refills | Status: AC

## 2024-05-11 NOTE — Telephone Encounter (Signed)
 [facility] Pre-operative Risk Assessment     Request for surgical clearance:     Endoscopy Procedure  What type of surgery is being performed?     Colonoscopy  When is this surgery scheduled?     05/24/24  What type of clearance is required ?   Pharmacy  Are there any medications that need to be held prior to surgery and how long? Eliquis  2 days  Practice name and name of physician performing surgery?      [facility]  What is your office phone and fax number?      Phone- [phone]  Fax- [phone]  Anesthesia type (None, local, MAC, general) ?       MAC   Please route your response to Alethea Blocker, CMA

## 2024-05-11 NOTE — Patient Instructions (Addendum)
 You have been scheduled for an Colonoscopy. Please follow written instructions given to you at your visit today.  If you use inhalers (even only as needed), please bring them with you on the day of your procedure.  If you take any of the following medications, they will need to be adjusted prior to your procedure:   DO NOT TAKE 7 DAYS PRIOR TO TEST- Trulicity (dulaglutide) Ozempic, Wegovy (semaglutide) Mounjaro (tirzepatide) Bydureon Bcise (exanatide extended release)  DO NOT TAKE 1 DAY PRIOR TO YOUR TEST Rybelsus (semaglutide) Adlyxin (lixisenatide) Victoza (liraglutide) Byetta (exanatide) ___________________________________________________________________________  Please follow up sooner if symptoms increase or worsen  Due to recent changes in healthcare laws, you may see the results of your imaging and laboratory studies on MyChart before your provider has had a chance to review them.  We understand that in some cases there may be results that are confusing or concerning to you. Not all laboratory results come back in the same time frame and the provider may be waiting for multiple results in order to interpret others.  Please give us  48 hours in order for your provider to thoroughly review all the results before contacting the office for clarification of your results.   Thank you for trusting me with your gastrointestinal care!   Ellouise Console, PA-C _______________________________________________________  If your blood pressure at your visit was 140/90 or greater, please contact your primary care physician to follow up on this.  _______________________________________________________  If you are age 79 or older, your body mass index should be between 23-30. Your Body mass index is 23.3 kg/m. If this is out of the aforementioned range listed, please consider follow up with your Primary Care Provider.  If you are age 60 or younger, your body mass index should be between 19-25.  Your Body mass index is 23.3 kg/m. If this is out of the aformentioned range listed, please consider follow up with your Primary Care Provider.   ________________________________________________________  The [facility] providers would like to encourage you to use MYCHART to communicate with providers for non-urgent requests or questions.  Due to long hold times on the telephone, sending your provider a message by [facility] may be a faster and more efficient way to get a response.  Please allow 48 business hours for a response.  Please remember that this is for non-urgent requests.  _______________________________________________________

## 2024-05-17 ENCOUNTER — Other Ambulatory Visit: Payer: Self-pay | Admitting: Nurse Practitioner

## 2024-05-17 DIAGNOSIS — E538 Deficiency of other specified B group vitamins: Secondary | ICD-10-CM | POA: Diagnosis not present

## 2024-05-17 DIAGNOSIS — Z7901 Long term (current) use of anticoagulants: Secondary | ICD-10-CM | POA: Diagnosis not present

## 2024-05-17 LAB — CBC
Hematocrit: 38.1 % (ref 34.0–46.6)
Hemoglobin: 12.3 g/dL (ref 11.1–15.9)
MCH: 29.3 pg (ref 26.6–33.0)
MCHC: 32.3 g/dL (ref 31.5–35.7)
MCV: 91 fL (ref 79–97)
Platelets: 179 x10E3/uL (ref 150–450)
RBC: 4.2 x10E6/uL (ref 3.77–5.28)
RDW: 12.7 % (ref 11.7–15.4)
WBC: 6.9 x10E3/uL (ref 3.4–10.8)

## 2024-05-17 NOTE — Telephone Encounter (Signed)
 Left message for the pt to call back as she is going to need labs to be done as well as a tele preop appt a few days after the labs are done. I did d/w the preop APP who stated tele preop appt to be done at least 1 week after labs; however the time frame here does not allow for that. I will see if the pt can get her labs today and will set up tele appropriately.

## 2024-05-17 NOTE — Telephone Encounter (Signed)
 Patient will need CBC and BMET followed by a virtual visit at least 1 week after labs obtained.   Orders placed for labs.

## 2024-05-17 NOTE — Telephone Encounter (Signed)
 Needs updated labs - BMET, CBC

## 2024-05-17 NOTE — Addendum Note (Signed)
 Addended by: PERCY ROSALINE HERO on: 05/17/2024 07:19 AM   Modules accepted: Orders

## 2024-05-18 ENCOUNTER — Ambulatory Visit: Payer: Self-pay | Admitting: Nurse Practitioner

## 2024-05-18 LAB — BASIC METABOLIC PANEL WITH GFR
BUN/Creatinine Ratio: 16 (ref 12–28)
BUN: 12 mg/dL (ref 8–27)
CO2: 22 mmol/L (ref 20–29)
Calcium: 9.3 mg/dL (ref 8.7–10.3)
Chloride: 100 mmol/L (ref 96–106)
Creatinine, Ser: 0.73 mg/dL (ref 0.57–1.00)
Glucose: 107 mg/dL — ABNORMAL HIGH (ref 70–99)
Potassium: 4.3 mmol/L (ref 3.5–5.2)
Sodium: 135 mmol/L (ref 134–144)
eGFR: 84 mL/min/1.73 (ref >59)

## 2024-05-18 NOTE — Telephone Encounter (Signed)
 I will send back to preop APP and pharm-d to review the labs. Pt needs tele preop appt . Procedure is 05/24/24

## 2024-05-18 NOTE — Telephone Encounter (Signed)
Left message to call back and schedule tele pre op appt

## 2024-05-18 NOTE — Telephone Encounter (Signed)
 Patient with diagnosis of Afib on Eliquis  for anticoagulation.    Procedure: Colonoscopy  Date of procedure: 05/24/2024   CHA2DS2-VASc Score = 3   This indicates a 3.2% annual risk of stroke. The patient's score is based upon: CHF History: 0 HTN History: 0 Diabetes History: 0 Stroke History: 0 Vascular Disease History: 0 Age Score: 2 Gender Score: 1  CrCl 63 mL/min  Platelet count 179 K  Patient has not had an Afib/aflutter ablation or Watchman within the last 3 months or DCCV within the last 30 days    Per office protocol, patient can hold Eliquis  for 2 days prior to procedure.   Patient will not need bridging with Lovenox (enoxaparin) around procedure.  **This guidance is not considered finalized until pre-operative APP has relayed final recommendations.**

## 2024-05-19 ENCOUNTER — Ambulatory Visit: Attending: Nurse Practitioner

## 2024-05-19 ENCOUNTER — Telehealth (HOSPITAL_BASED_OUTPATIENT_CLINIC_OR_DEPARTMENT_OTHER): Payer: Self-pay | Admitting: *Deleted

## 2024-05-19 DIAGNOSIS — Z01810 Encounter for preprocedural cardiovascular examination: Secondary | ICD-10-CM

## 2024-05-19 NOTE — Telephone Encounter (Signed)
 Pt called back and has been scheduled tele preop appt today. Med rec and consent are done. Procedure 05/24/24. Pt has been given the land line # [phone] the the preop APP will call from.

## 2024-05-19 NOTE — Telephone Encounter (Signed)
 Called pt to discuss when to hold Eliquis  for upcoming colonoscopy on 05/24/24, as it was unclear if she had been told. She did not answer. Left detailed message for her to take the last dose on Sunday evening (05/21/24). Attempted a second call and still no answer.

## 2024-05-19 NOTE — Progress Notes (Signed)
 Virtual Visit via Telephone Note   Because of Syeda  P Raczynski co-morbid illnesses, she is at least at moderate risk for complications without adequate follow up.  This format is felt to be most appropriate for this patient at this time.  Due to technical limitations with video connection (technology), today's appointment will be conducted as an audio only telehealth visit, and Marlette  P Nahm verbally agreed to proceed in this manner.   All issues noted in this document were discussed and addressed.  No physical exam could be performed with this format.  Evaluation Performed:  Preoperative cardiovascular risk assessment _____________   Date:  05/19/2024   Patient ID:  KANIJA REMMEL, DOB 19-Mar-1946, MRN [id number] Patient Location:  Home Provider location:   Office  Primary Care Provider:  Rolinda Millman, MD Primary Cardiologist:  Oneil Parchment, MD  Chief Complaint / Patient Profile   78 y.o. y/o female with a h/o atrial fibrillation, thyroiditis, anxiety, depression who is pending colonoscopy and presents today for telephonic preoperative cardiovascular risk assessment.  History of Present Illness    Addison  P Pereyra is a 78 y.o. female who presents via audio/video conferencing for a telehealth visit today.  Pt was last seen in cardiology clinic on 05/26/2023 by Charlies Arthur, PA.  At that time Enolia  P Eakes was doing well with no new cardiac complaints. She reported no recurrence of atrial fibrillation since prior visit.  The patient is now pending procedure as outlined above. Since her last visit, she has been doing well with no new cardiac complaints.  She denies chest pain, shortness of breath, lower extremity edema, fatigue, palpitations, melena, hematuria, hemoptysis, diaphoresis, weakness, presyncope, syncope, orthopnea, and PND.   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   Osteoporosis    Seasonal allergies    Past Surgical History:  Procedure Laterality Date   ABDOMINAL HYSTERECTOMY  1984   APPENDECTOMY  1984   at time of Hysterectomy   BILIARY DILATION  07/21/2019   Procedure: BILIARY DILATION;  Surgeon: Wilhelmenia Aloha Raddle., MD;  Location: [facility] ENDOSCOPY;  Service: Gastroenterology;;   CHOLECYSTECTOMY N/A 07/22/2019   Procedure: LAPAROSCOPIC CHOLECYSTECTOMY;  Surgeon: Signe Mitzie LABOR, MD;  Location: [facility] OR;  Service: General;  Laterality: N/A;   ERCP N/A 07/21/2019   Procedure: ENDOSCOPIC RETROGRADE CHOLANGIOPANCREATOGRAPHY (ERCP);  Surgeon: Wilhelmenia Aloha Raddle., MD;  Location: [facility] ENDOSCOPY;  Service: Gastroenterology;  Laterality: N/A;   ERCP     HAMMER TOE SURGERY  1993   2nd toe left foot   PANCREATIC STENT PLACEMENT  07/21/2019   Procedure: PANCREATIC STENT PLACEMENT;  Surgeon: Wilhelmenia Aloha Raddle., MD;  Location: [facility] ENDOSCOPY;  Service: Gastroenterology;;   REMOVAL OF STONES  07/21/2019   Procedure: REMOVAL OF STONES;  Surgeon: Wilhelmenia Aloha Raddle., MD;  Location: [facility] ENDOSCOPY;  Service: Gastroenterology;;   ANNETT  07/21/2019   Procedure: ANNETT;  Surgeon: Wilhelmenia Aloha Raddle., MD;  Location: [facility] ENDOSCOPY;  Service: Gastroenterology;;   THYROID  LOBECTOMY  09/15/2012   Procedure: THYROID  LOBECTOMY;  Surgeon: Norleen Notice, MD;  Location: [facility] OR;  Service: ENT;  Laterality: Left;   THYROIDECTOMY, PARTIAL  09/15/2012   Dr Notice   TONSILLECTOMY  1957    Allergies  Allergies  Allergen Reactions   Amiodarone  Other (See Comments)    Hair loss   Procaine Other (See Comments)    REACTION:  Lowers blood pressure, makes me loopy, can't move or talk    Home Medications    Prior to Admission medications   Medication Sig Start Date End Date  Taking? Authorizing Provider  ALPRAZolam  (XANAX ) 0.5 MG tablet TAKE 1/2 (ONE-HALF) TO 1 TABLET BY MOUTH THREE TIMES DAILY AS NEEDED 03/04/20   Myrna Camelia HERO, NP  apixaban  (ELIQUIS ) 5 MG TABS tablet Take 1 tablet (5 mg total) by mouth 2 (two) times daily. 12/31/23   Jeffrie Oneil BROCKS, MD  B-D TB SYRINGE 1CC/25GX5/8 25G X 5/8 1 ML MISC USE FOR B12 INJECTIONS 07/16/20   [provider]  baclofen  (LIORESAL ) 10 MG tablet TAKE 2 TABLETS BY MOUTH AT BEDTIME 09/27/20   Harvey Seltzer, MD  calcium carbonate (OS-CAL - DOSED IN MG OF ELEMENTAL CALCIUM) 1250 (500 Ca) MG tablet Take 1 tablet by mouth 2 (two) times daily.    [provider]  cyanocobalamin  (,VITAMIN B-12,) 1000 MCG/ML injection INJECT 1 ML (CC) INTRAMUSCULARLY ONCE EVERY 30 DAYS 09/27/20   Myrna Camelia HERO, NP  escitalopram  (LEXAPRO ) 5 MG tablet Take 5 mg by mouth daily. 09/06/20   [provider]  ezetimibe  (ZETIA ) 10 MG tablet Take 1 tablet by mouth once daily 10/30/20   Myrna Camelia HERO, NP  Multiple Vitamin (MULTIVITAMIN WITH MINERALS) TABS tablet Take 1 tablet by mouth daily. Centrum    [provider]  NEEDLE, DISP, 25 G (BD ECLIPSE NEEDLE) 25G X 5/8 MISC Use for B12 injections 10/31/19   Myrna Camelia HERO, NP  Suvorexant  (BELSOMRA ) 15 MG TABS Take 15 mg by mouth at bedtime as needed (for insomnia, may alternate with xanax  but do NOT take along with xanax ). Insomnia prn, can not use with xanax . 05/27/22   Dohmeier, Dedra, MD  traZODone  (DESYREL ) 100 MG tablet Take 1.5 tablets (150 mg total) by mouth at bedtime. 05/27/22   Dohmeier, Dedra, MD    Physical Exam    Vital Signs:  Quinlynn  P Gardella does not have vital signs available for review today.  Given telephonic nature of communication, physical exam is limited. AAOx3. NAD. Normal affect.  Speech and respirations are unlabored.  Accessory Clinical Findings    None  Assessment & Plan    1.  Preoperative Cardiovascular Risk Assessment: - Patient's RCRI  score is 0.4%  The patient affirms she has been doing well without any new cardiac symptoms. They are able to achieve 6 METS without cardiac limitations. Therefore, based on ACC/AHA guidelines, the patient would be at acceptable risk for the planned procedure without further cardiovascular testing. The patient was advised that if she develops new symptoms prior to surgery to contact our office to arrange for a follow-up visit, and she verbalized understanding.   The patient was advised that if she develops new symptoms prior to surgery to contact our office to arrange for a follow-up visit, and she verbalized understanding.  Per office protocol, patient can hold Eliquis  for 2 days prior to procedure.   Patient will not need bridging with Lovenox (enoxaparin) around procedure.  A copy of this note will be routed to requesting surgeon.  Time:   Today, I have spent 7 minutes with the patient with telehealth technology discussing medical history, symptoms, and management plan.     Wyn Raddle, Jackee Shove, NP  05/19/2024,  12:04 PM

## 2024-05-19 NOTE — Telephone Encounter (Signed)
 Pt called back and has been scheduled tele preop appt today. Med rec and consent are done. Procedure 05/24/24. Pt has been given the land line # [phone] the the preop APP will call from.      Patient Consent for Virtual Visit        Carly Rhodes has provided verbal consent on 05/19/2024 for a virtual visit (video or telephone).   CONSENT FOR VIRTUAL VISIT FOR:  Carly Rhodes  By participating in this virtual visit I agree to the following:  I hereby voluntarily request, consent and authorize [facility] and its employed or contracted physicians, physician assistants, nurse practitioners or other licensed health care professionals (the Practitioner), to provide me with telemedicine health care services (the "Services) as deemed necessary by the treating Practitioner. I acknowledge and consent to receive the Services by the Practitioner via telemedicine. I understand that the telemedicine visit will involve communicating with the Practitioner through live audiovisual communication technology and the disclosure of certain medical information by electronic transmission. I acknowledge that I have been given the opportunity to request an in-person assessment or other available alternative prior to the telemedicine visit and am voluntarily participating in the telemedicine visit.  I understand that I have the right to withhold or withdraw my consent to the use of telemedicine in the course of my care at any time, without affecting my right to future care or treatment, and that the Practitioner or I may terminate the telemedicine visit at any time. I understand that I have the right to inspect all information obtained and/or recorded in the course of the telemedicine visit and may receive copies of available information for a reasonable fee.  I understand that some of the potential risks of receiving the Services via telemedicine include:  Delay or interruption in medical evaluation  due to technological equipment failure or disruption; Information transmitted may not be sufficient (e.g. poor resolution of images) to allow for appropriate medical decision making by the Practitioner; and/or  In rare instances, security protocols could fail, causing a breach of personal health information.  Furthermore, I acknowledge that it is my responsibility to provide information about my medical history, conditions and care that is complete and accurate to the best of my ability. I acknowledge that Practitioner's advice, recommendations, and/or decision may be based on factors not within their control, such as incomplete or inaccurate data provided by me or distortions of diagnostic images or specimens that may result from electronic transmissions. I understand that the practice of medicine is not an exact science and that Practitioner makes no warranties or guarantees regarding treatment outcomes. I acknowledge that a copy of this consent can be made available to me via my patient portal [facility] MyChart), or I can request a printed copy by calling the office of [facility].    I understand that my insurance will be billed for this visit.   I have read or had this consent read to me. I understand the contents of this consent, which adequately explains the benefits and risks of the Services being provided via telemedicine.  I have been provided ample opportunity to ask questions regarding this consent and the Services and have had my questions answered to my satisfaction. I give my informed consent for the services to be provided through the use of telemedicine in my medical care

## 2024-05-19 NOTE — Telephone Encounter (Signed)
 I called the pt again today that she needs to call back to schedule tele preop appt per preop APP.

## 2024-05-24 ENCOUNTER — Encounter: Payer: Self-pay | Admitting: Gastroenterology

## 2024-05-24 ENCOUNTER — Ambulatory Visit: Admitting: Gastroenterology

## 2024-05-24 VITALS — BP 121/82 | HR 54 | Temp 97.2°F | Resp 16 | Ht 65.0 in | Wt 140.0 lb

## 2024-05-24 DIAGNOSIS — Z800 Family history of malignant neoplasm of digestive organs: Secondary | ICD-10-CM

## 2024-05-24 DIAGNOSIS — E538 Deficiency of other specified B group vitamins: Secondary | ICD-10-CM | POA: Diagnosis not present

## 2024-05-24 DIAGNOSIS — K625 Hemorrhage of anus and rectum: Secondary | ICD-10-CM

## 2024-05-24 DIAGNOSIS — K648 Other hemorrhoids: Secondary | ICD-10-CM

## 2024-05-24 DIAGNOSIS — K644 Residual hemorrhoidal skin tags: Secondary | ICD-10-CM | POA: Diagnosis not present

## 2024-05-24 DIAGNOSIS — K640 First degree hemorrhoids: Secondary | ICD-10-CM

## 2024-05-24 DIAGNOSIS — K562 Volvulus: Secondary | ICD-10-CM | POA: Diagnosis not present

## 2024-05-24 MED ORDER — SODIUM CHLORIDE 0.9 % IV SOLN: 500.0000 mL | Freq: Once | INTRAVENOUS | Status: DC

## 2024-05-24 NOTE — Op Note (Signed)
 [facility] Patient Name: Carly Rhodes Procedure Date: 05/24/2024 10:58 AM MRN: [id number] Endoscopist: Sandor Flatter , MD, [id number] Age: 78 Referring MD:  Date of Birth: Sep 28, 1945 Gender: Female Account #: [id number] Procedure:                Colonoscopy Indications:              Hematochezia Medicines:                Monitored Anesthesia Care Procedure:                Pre-Anesthesia Assessment:                           - Prior to the procedure, a History and Physical                            was performed, and patient medications and                            allergies were reviewed. The patient's tolerance of                            previous anesthesia was also reviewed. The risks                            and benefits of the procedure and the sedation                            options and risks were discussed with the patient.                            All questions were answered, and informed consent                            was obtained. Prior Anticoagulants: The patient has                            taken Eliquis  (apixaban ), last dose was 2 days                            prior to procedure. ASA Grade Assessment: II - A                            patient with mild systemic disease. After reviewing                            the risks and benefits, the patient was deemed in                            satisfactory condition to undergo the procedure.                           After obtaining informed consent, the colonoscope  was passed under direct vision. Throughout the                            procedure, the patient's blood pressure, pulse, and                            oxygen saturations were monitored continuously. The                            Olympus Scope SN [id number] was introduced through the                            anus and advanced to the the cecum, identified by                            appendiceal orifice  and ileocecal valve. The                            colonoscopy was performed without difficulty. The                            patient tolerated the procedure well. Following                            copious irrigation and lavage, the quality of the                            bowel preparation was good. The ileocecal valve,                            appendiceal orifice, and rectum were photographed. Scope In: 11:21:51 AM Scope Out: 11:43:57 AM Scope Withdrawal Time: 0 hours 10 minutes 26 seconds  Total Procedure Duration: 0 hours 22 minutes 6 seconds  Findings:                 Skin tags were found on perianal exam.                           The colon (entire examined portion) appeared normal.                           The sigmoid colon and ascending colon revealed                            moderately excessive looping. Advancing the scope                            required using manual pressure and straightening                            and shortening the scope to obtain bowel loop                            reduction.  Non-bleeding internal hemorrhoids were found during                            retroflexion. The hemorrhoids were small. Complications:            No immediate complications. Estimated Blood Loss:     Estimated blood loss: none. Impression:               - Perianal skin tags found on perianal exam.                           - The entire examined colon is normal.                           - There was significant looping of the colon.                           - Non-bleeding internal hemorrhoids.                           - No specimens collected. Recommendation:           - Patient has a contact number available for                            emergencies. The signs and symptoms of potential                            delayed complications were discussed with the                            patient. Return to normal activities tomorrow.                             Written discharge instructions were provided to the                            patient.                           - Resume previous diet.                           - Continue present medications.                           - Resume Eliquis  (apixaban ) at prior dose today.                           - Repeat colonoscopy is not recommended due to                            current age (83 years or older) for screening                            purposes.                           -  Return to GI clinic PRN. Sandor Flatter, MD 05/24/2024 11:49:22 AM

## 2024-05-24 NOTE — Progress Notes (Signed)
 Agree with the assessment and plan as outlined by Brigitte Canard, PA-C.  Leidy Massar, DO, [facility]

## 2024-05-24 NOTE — Progress Notes (Signed)
 Report to PACU, RN, vss, BBS= Clear.

## 2024-05-24 NOTE — Progress Notes (Signed)
 GASTROENTEROLOGY PROCEDURE H&P NOTE   Primary Care Physician: Rolinda Millman, MD    Reason for Procedure:   Rectal bleeding  Plan:    Colonoscopy  Patient is appropriate for endoscopic procedure(s) in the ambulatory ([facility]) setting.  The nature of the procedure, as well as the risks, benefits, and alternatives were carefully and thoroughly reviewed with the patient. Ample time for discussion and questions allowed. The patient understood, was satisfied, and agreed to proceed.     HPI: Carly  P Rhodes is a 78 y.o. female who presents for colonoscopy for evaluation of episodic hematochezia .  Patient was most recently seen in the Gastroenterology Clinic on 05/11/2024.  Has been holding Eliquis  for procedure today. Otherwise, no interval change in medical history since that appointment. Please refer to that note for full details regarding GI history and clinical presentation.   09/2011 last colonoscopy by Dr. Obie: Normal.  No polyps.  Good prep.  10-year repeat, pending health status.   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 ENDOSCOPY;  Service: Gastroenterology;;   CHOLECYSTECTOMY N/A 07/22/2019   Procedure: LAPAROSCOPIC CHOLECYSTECTOMY;  Surgeon: Signe Mitzie LABOR, MD;  Location: [facility] OR;  Service: General;  Laterality: N/A;   ERCP N/A 07/21/2019   Procedure: ENDOSCOPIC RETROGRADE CHOLANGIOPANCREATOGRAPHY (ERCP);  Surgeon: Wilhelmenia Aloha Raddle., MD;   Location: [facility] ENDOSCOPY;  Service: Gastroenterology;  Laterality: N/A;   ERCP     HAMMER TOE SURGERY  1993   2nd toe left foot   PANCREATIC STENT PLACEMENT  07/21/2019   Procedure: PANCREATIC STENT PLACEMENT;  Surgeon: Wilhelmenia Aloha Raddle., MD;  Location[facility] ENDOSCOPY;  Service: Gastroenterology;;   REMOVAL OF STONES  07/21/2019   Procedure: REMOVAL OF STONES;  Surgeon: Wilhelmenia Aloha Raddle., MD;  Location: [facility] ENDOSCOPY;  Service: Gastroenterology;;   ANNETT  07/21/2019   Procedure: ANNETT;  Surgeon: Wilhelmenia Aloha Raddle., MD;  Location: [facility] ENDOSCOPY;  Service: Gastroenterology;;   THYROID  LOBECTOMY  09/15/2012   Procedure: THYROID  LOBECTOMY;  Surgeon: Norleen Notice, MD;  Location: [facility] OR;  Service: ENT;  Laterality: Left;   THYROIDECTOMY, PARTIAL  09/15/2012   Dr Notice   TONSILLECTOMY  1957    Prior to Admission medications   Medication Sig Start Date End Date Taking? Authorizing Provider  ALPRAZolam  (XANAX ) 0.5 MG tablet TAKE 1/2 (ONE-HALF) TO 1 TABLET BY MOUTH THREE TIMES DAILY AS NEEDED 03/04/20  Yes Myrna Camelia HERO, NP  baclofen  (LIORESAL ) 10 MG tablet TAKE 2 TABLETS BY MOUTH AT BEDTIME 09/27/20  Yes Harvey Seltzer, MD  calcium carbonate (OS-CAL - DOSED IN MG OF ELEMENTAL CALCIUM) 1250 (500 Ca) MG tablet Take 1 tablet by mouth 2 (two) times daily.   Yes [provider]  cyanocobalamin  (,VITAMIN B-12,) 1000 MCG/ML injection INJECT 1 ML (CC) INTRAMUSCULARLY ONCE EVERY 30 DAYS 09/27/20  Yes Myrna Camelia HERO, NP  escitalopram  (LEXAPRO ) 5 MG tablet Take 5 mg  by mouth daily. 09/06/20  Yes [provider]  ezetimibe  (ZETIA ) 10 MG tablet Take 1 tablet by mouth once daily 10/30/20  Yes Myrna Camelia HERO, NP  Multiple Vitamin (MULTIVITAMIN WITH MINERALS) TABS tablet Take 1 tablet by mouth daily. Centrum   Yes [provider]  NEEDLE, DISP, 25 G (BD ECLIPSE NEEDLE) 25G X 5/8 MISC Use for B12 injections 10/31/19  Yes King, Camelia HERO, NP  Suvorexant  (BELSOMRA ) 15 MG  TABS Take 15 mg by mouth at bedtime as needed (for insomnia, may alternate with xanax  but do NOT take along with xanax ). Insomnia prn, can not use with xanax . 05/27/22  Yes Dohmeier, Dedra, MD  traZODone  (DESYREL ) 100 MG tablet Take 1.5 tablets (150 mg total) by mouth at bedtime. 05/27/22  Yes Dohmeier, Dedra, MD  apixaban  (ELIQUIS ) 5 MG TABS tablet Take 1 tablet (5 mg total) by mouth 2 (two) times daily. 12/31/23   Jeffrie Oneil BROCKS, MD  B-D TB SYRINGE 1CC/25GX5/8 25G X 5/8 1 ML MISC USE FOR B12 INJECTIONS 07/16/20   [provider]    Current Outpatient Medications  Medication Sig Dispense Refill   ALPRAZolam  (XANAX ) 0.5 MG tablet TAKE 1/2 (ONE-HALF) TO 1 TABLET BY MOUTH THREE TIMES DAILY AS NEEDED 60 tablet 0   baclofen  (LIORESAL ) 10 MG tablet TAKE 2 TABLETS BY MOUTH AT BEDTIME 60 tablet 0   calcium carbonate (OS-CAL - DOSED IN MG OF ELEMENTAL CALCIUM) 1250 (500 Ca) MG tablet Take 1 tablet by mouth 2 (two) times daily.     cyanocobalamin  (,VITAMIN B-12,) 1000 MCG/ML injection INJECT 1 ML (CC) INTRAMUSCULARLY ONCE EVERY 30 DAYS 3 mL 0   escitalopram  (LEXAPRO ) 5 MG tablet Take 5 mg by mouth daily.     ezetimibe  (ZETIA ) 10 MG tablet Take 1 tablet by mouth once daily 90 tablet 0   Multiple Vitamin (MULTIVITAMIN WITH MINERALS) TABS tablet Take 1 tablet by mouth daily. Centrum     NEEDLE, DISP, 25 G (BD ECLIPSE NEEDLE) 25G X 5/8 MISC Use for B12 injections 100 each 3   Suvorexant  (BELSOMRA ) 15 MG TABS Take 15 mg by mouth at bedtime as needed (for insomnia, may alternate with xanax  but do NOT take along with xanax ). Insomnia prn, can not use with xanax . 30 tablet 0   traZODone  (DESYREL ) 100 MG tablet Take 1.5 tablets (150 mg total) by mouth at bedtime. 45 tablet 0   apixaban  (ELIQUIS ) 5 MG TABS tablet Take 1 tablet (5 mg total) by mouth 2 (two) times daily. 180 tablet 1   B-D TB SYRINGE 1CC/25GX5/8 25G X 5/8 1 ML MISC USE FOR B12 INJECTIONS     Current Facility-Administered Medications   Medication Dose Route Frequency Provider Last Rate Last Admin   0.9 %  sodium chloride  infusion  500 mL Intravenous Once Rucker Pridgeon V, DO        Allergies as of 05/24/2024 - Review Complete 05/24/2024  Allergen Reaction Noted   Amiodarone  Other (See Comments) 08/11/2018   Procaine Other (See Comments) 09/09/2012    Family History  Problem Relation Age of Onset   Diabetes Father    Hypertension Sister    Stomach cancer Brother 91   Colon cancer Paternal Grandmother 53   Heart attack Neg Hx    Esophageal cancer Neg Hx    Rectal cancer Neg Hx     Social History   Socioeconomic History   Marital status: Widowed    Spouse name: Not on file   Number of  children: Not on file   Years of education: 12   Highest education level: Not on file  Occupational History   Not on file  Tobacco Use   Smoking status: Never   Smokeless tobacco: Never  Vaping Use   Vaping status: Never Used  Substance and Sexual Activity   Alcohol use: Never   Drug use: No   Sexual activity: Not Currently    Birth control/protection: Post-menopausal, Surgical  Other Topics Concern   Not on file  Social History Narrative   Pt lives in 2 story home with her husband   Current care giver to husband   Right handed   Drinks 2-3 cups caffeine daily   Social Drivers of Corporate investment banker Strain: Not on file  Food Insecurity: Not on file  Transportation Needs: Not on file  Physical Activity: Not on file  Stress: Not on file  Social Connections: Not on file  Intimate Partner Violence: Not on file    Physical Exam: Vital signs in last 24 hours: @BP  (!) 142/85   Pulse (!) 57   Temp (!) 97.2 F (36.2 C) (Skin)   Resp 17   Ht 5' 5 (1.651 m)   Wt 140 lb (63.5 kg)   SpO2 97%   BMI 23.30 kg/m  GEN: NAD EYE: Sclerae anicteric ENT: MMM CV: Non-tachycardic Pulm: CTA b/l GI: Soft, NT/ND NEURO:  Alert & Oriented x 3   Sandor Flatter, DO [facility]   05/24/2024  11:07 AM

## 2024-05-24 NOTE — Patient Instructions (Signed)
 Handouts given: Hemorrhoids Resume previous diet. Continue present medications.  Resume Eliquis  at prior dose today. Repeat colonoscopy is not recommended due to current age.  Return to GI clinic PRN.  YOU HAD AN ENDOSCOPIC PROCEDURE TODAY AT THE [facility]:   Refer to the procedure report that was given to you for any specific questions about what was found during the examination.  If the procedure report does not answer your questions, please call your gastroenterologist to clarify.  If you requested that your care partner not be given the details of your procedure findings, then the procedure report has been included in a sealed envelope for you to review at your convenience later.  YOU SHOULD EXPECT: Some feelings of bloating in the abdomen. Passage of more gas than usual.  Walking can help get rid of the air that was put into your GI tract during the procedure and reduce the bloating. If you had a lower endoscopy (such as a colonoscopy or flexible sigmoidoscopy) you may notice spotting of blood in your stool or on the toilet paper. If you underwent a bowel prep for your procedure, you may not have a normal bowel movement for a few days.  Please Note:  You might notice some irritation and congestion in your nose or some drainage.  This is from the oxygen used during your procedure.  There is no need for concern and it should clear up in a day or so.  SYMPTOMS TO REPORT IMMEDIATELY:  Following lower endoscopy (colonoscopy or flexible sigmoidoscopy):  Excessive amounts of blood in the stool  Significant tenderness or worsening of abdominal pains  Swelling of the abdomen that is new, acute  Fever of 100F or higher  For urgent or emergent issues, a gastroenterologist can be reached at any hour by calling [phone]. Do not use MyChart messaging for urgent concerns.    DIET:  We do recommend a small meal at first, but then you may proceed to your regular diet.  Drink plenty  of fluids but you should avoid alcoholic beverages for 24 hours.  ACTIVITY:  You should plan to take it easy for the rest of today and you should NOT DRIVE or use heavy machinery until tomorrow (because of the sedation medicines used during the test).    FOLLOW UP: Our staff will call the number listed on your records the next business day following your procedure.  We will call around 7:15- 8:00 am to check on you and address any questions or concerns that you may have regarding the information given to you following your procedure. If we do not reach you, we will leave a message.     If any biopsies were taken you will be contacted by phone or by letter within the next 1-3 weeks.  Please call us  at [phone] if you have not heard about the biopsies in 3 weeks.    SIGNATURES/CONFIDENTIALITY: You and/or your care partner have signed paperwork which will be entered into your electronic medical record.  These signatures attest to the fact that that the information above on your After Visit Summary has been reviewed and is understood.  Full responsibility of the confidentiality of this discharge information lies with you and/or your care-partner.

## 2024-05-25 ENCOUNTER — Telehealth: Payer: Self-pay

## 2024-05-25 NOTE — Telephone Encounter (Signed)
 No answer on follow-up call. Left VM for pt.

## 2024-05-31 DIAGNOSIS — E538 Deficiency of other specified B group vitamins: Secondary | ICD-10-CM | POA: Diagnosis not present

## 2024-06-07 DIAGNOSIS — E538 Deficiency of other specified B group vitamins: Secondary | ICD-10-CM | POA: Diagnosis not present

## 2024-06-14 DIAGNOSIS — Z23 Encounter for immunization: Secondary | ICD-10-CM | POA: Diagnosis not present

## 2024-06-14 DIAGNOSIS — E538 Deficiency of other specified B group vitamins: Secondary | ICD-10-CM | POA: Diagnosis not present

## 2024-06-21 DIAGNOSIS — E538 Deficiency of other specified B group vitamins: Secondary | ICD-10-CM | POA: Diagnosis not present

## 2024-06-28 DIAGNOSIS — E538 Deficiency of other specified B group vitamins: Secondary | ICD-10-CM | POA: Diagnosis not present

## 2024-07-03 ENCOUNTER — Ambulatory Visit: Admitting: Physician Assistant

## 2024-07-05 DIAGNOSIS — Z23 Encounter for immunization: Secondary | ICD-10-CM | POA: Diagnosis not present

## 2024-07-06 ENCOUNTER — Ambulatory Visit (INDEPENDENT_AMBULATORY_CARE_PROVIDER_SITE_OTHER): Admitting: Sports Medicine

## 2024-07-06 VITALS — BP 164/100 | Ht 65.5 in | Wt 143.0 lb

## 2024-07-06 DIAGNOSIS — M76821 Posterior tibial tendinitis, right leg: Secondary | ICD-10-CM

## 2024-07-06 DIAGNOSIS — M76822 Posterior tibial tendinitis, left leg: Secondary | ICD-10-CM

## 2024-07-06 MED ORDER — ACETAMINOPHEN-CODEINE 300-30 MG PO TABS: 1.0000 | ORAL_TABLET | Freq: Three times a day (TID) | ORAL | 1 refills | Status: AC | PRN

## 2024-07-06 NOTE — Progress Notes (Signed)
 Discussed the use of AI scribe software for clinical note transcription with the patient, who gave verbal consent to proceed.  History of Present Illness Carly Rhodes is a 78 year old female who presents with foot pain.  Foot pain and functional limitation - Foot pain primarily on the left side, running up the arch and along the inside of the leg in the posterior tibialis distribution - Current pain limits ability to walk long distances - Exercise routine has decreased due to foot pain, although able to walk as far as cardiovascular health allows - No other areas of pain - No recent falls  Orthotic use and surgical history - Custom orthotics have been used for years and have been effective for foot pain  Physical Exam Older F in NAD BP (!) 164/100   Ht 5' 5.5 (1.664 m)   Wt 143 lb (64.9 kg)   BMI 23.43 kg/m   - Hammer toe correction performed on the second toe of the left foot Loss of toenail on the left great toe Flattening of the longitudinal arch on the left  Bunion deformity - Significant bunion present on the right foot, crossing over the second toe slightly - No surgical correction performed for the right foot bunion 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

## 2024-07-06 NOTE — Assessment & Plan Note (Addendum)
 The symptoms have returned since she is out of her custom orthotics They were better controlled before and orthotics so we will plan to make her new pair today  Patient was fitted for a : standard, cushioned, semi-rigid orthotic. The orthotic was heated and afterward the patient stood on the orthotic blank positioned on the orthotic stand. The patient was positioned in subtalar neutral position and 10 degrees of ankle dorsiflexion in a weight bearing stance. After completion of molding, a stable base was applied to the orthotic blank. The blank was ground to a stable position for weight bearing. 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

## 2024-07-12 DIAGNOSIS — E538 Deficiency of other specified B group vitamins: Secondary | ICD-10-CM | POA: Diagnosis not present

## 2024-07-19 DIAGNOSIS — E538 Deficiency of other specified B group vitamins: Secondary | ICD-10-CM | POA: Diagnosis not present

## 2024-07-26 DIAGNOSIS — E538 Deficiency of other specified B group vitamins: Secondary | ICD-10-CM | POA: Diagnosis not present

## 2024-07-26 DIAGNOSIS — E782 Mixed hyperlipidemia: Secondary | ICD-10-CM | POA: Diagnosis not present

## 2024-09-21 ENCOUNTER — Encounter: Payer: Self-pay | Admitting: Physician Assistant

## 2024-09-21 NOTE — Telephone Encounter (Signed)
 Error

## 2024-11-08 ENCOUNTER — Ambulatory Visit: Admitting: Cardiovascular Disease
